# Patient Record
Sex: Female | Born: 1940
Health system: Southern US, Community
[De-identification: ages and names within clinical notes are randomized; demographics above are authoritative.]

## PROBLEM LIST (undated history)

## (undated) DIAGNOSIS — Z87442 Personal history of urinary calculi: Secondary | ICD-10-CM

## (undated) DIAGNOSIS — E7800 Pure hypercholesterolemia, unspecified: Secondary | ICD-10-CM

## (undated) DIAGNOSIS — R112 Nausea with vomiting, unspecified: Secondary | ICD-10-CM

## (undated) DIAGNOSIS — Z87410 Personal history of cervical dysplasia: Secondary | ICD-10-CM

## (undated) DIAGNOSIS — M545 Low back pain, unspecified: Secondary | ICD-10-CM

## (undated) DIAGNOSIS — D649 Anemia, unspecified: Secondary | ICD-10-CM

## (undated) DIAGNOSIS — G4730 Sleep apnea, unspecified: Secondary | ICD-10-CM

## (undated) DIAGNOSIS — K2270 Barrett's esophagus without dysplasia: Secondary | ICD-10-CM

## (undated) DIAGNOSIS — C801 Malignant (primary) neoplasm, unspecified: Secondary | ICD-10-CM

## (undated) DIAGNOSIS — K219 Gastro-esophageal reflux disease without esophagitis: Secondary | ICD-10-CM

## (undated) DIAGNOSIS — K3184 Gastroparesis: Secondary | ICD-10-CM

## (undated) DIAGNOSIS — E119 Type 2 diabetes mellitus without complications: Secondary | ICD-10-CM

## (undated) DIAGNOSIS — M179 Osteoarthritis of knee, unspecified: Secondary | ICD-10-CM

## (undated) DIAGNOSIS — N189 Chronic kidney disease, unspecified: Secondary | ICD-10-CM

## (undated) DIAGNOSIS — M1710 Unilateral primary osteoarthritis, unspecified knee: Secondary | ICD-10-CM

## (undated) DIAGNOSIS — E1143 Type 2 diabetes mellitus with diabetic autonomic (poly)neuropathy: Secondary | ICD-10-CM

## (undated) DIAGNOSIS — Z87440 Personal history of urinary (tract) infections: Secondary | ICD-10-CM

## (undated) DIAGNOSIS — L308 Other specified dermatitis: Secondary | ICD-10-CM

## (undated) DIAGNOSIS — M1990 Unspecified osteoarthritis, unspecified site: Secondary | ICD-10-CM

## (undated) DIAGNOSIS — H409 Unspecified glaucoma: Secondary | ICD-10-CM

## (undated) DIAGNOSIS — E1140 Type 2 diabetes mellitus with diabetic neuropathy, unspecified: Secondary | ICD-10-CM

## (undated) DIAGNOSIS — E2740 Unspecified adrenocortical insufficiency: Secondary | ICD-10-CM

## (undated) DIAGNOSIS — E041 Nontoxic single thyroid nodule: Secondary | ICD-10-CM

## (undated) DIAGNOSIS — I10 Essential (primary) hypertension: Secondary | ICD-10-CM

## (undated) DIAGNOSIS — I739 Peripheral vascular disease, unspecified: Secondary | ICD-10-CM

## (undated) DIAGNOSIS — D869 Sarcoidosis, unspecified: Secondary | ICD-10-CM

## (undated) DIAGNOSIS — R1310 Dysphagia, unspecified: Secondary | ICD-10-CM

## (undated) DIAGNOSIS — Z9889 Other specified postprocedural states: Secondary | ICD-10-CM

## (undated) DIAGNOSIS — M8580 Other specified disorders of bone density and structure, unspecified site: Secondary | ICD-10-CM

## (undated) DIAGNOSIS — R0602 Shortness of breath: Secondary | ICD-10-CM

## (undated) DIAGNOSIS — H9191 Unspecified hearing loss, right ear: Secondary | ICD-10-CM

## (undated) HISTORY — DX: Unilateral primary osteoarthritis, unspecified knee: M17.10

## (undated) HISTORY — DX: Peripheral vascular disease, unspecified: I73.9

## (undated) HISTORY — DX: Type 2 diabetes mellitus with diabetic autonomic (poly)neuropathy: E11.43

## (undated) HISTORY — DX: Gastroparesis: K31.84

## (undated) HISTORY — PX: BREAST BIOPSY: SHX20

## (undated) HISTORY — PX: ESOPHAGOGASTRODUODENOSCOPY (EGD) WITH ESOPHAGEAL DILATION: SHX5812

## (undated) HISTORY — DX: Low back pain: M54.5

## (undated) HISTORY — PX: COLONOSCOPY: SHX174

## (undated) HISTORY — DX: Other specified dermatitis: L30.8

## (undated) HISTORY — PX: OTHER SURGICAL HISTORY: SHX169

## (undated) HISTORY — DX: Barrett's esophagus without dysplasia: K22.70

## (undated) HISTORY — DX: Other specified disorders of bone density and structure, unspecified site: M85.80

## (undated) HISTORY — PX: BREAST EXCISIONAL BIOPSY: SUR124

## (undated) HISTORY — DX: Low back pain, unspecified: M54.50

## (undated) HISTORY — DX: Type 2 diabetes mellitus with diabetic neuropathy, unspecified: E11.40

## (undated) HISTORY — DX: Personal history of cervical dysplasia: Z87.410

## (undated) HISTORY — DX: Osteoarthritis of knee, unspecified: M17.9

## (undated) HISTORY — DX: Nontoxic single thyroid nodule: E04.1

---

## 1963-12-31 HISTORY — PX: DILATION AND CURETTAGE OF UTERUS: SHX78

## 1969-12-30 HISTORY — PX: PARTIAL HYSTERECTOMY: SHX80

## 1979-12-31 HISTORY — PX: CHOLECYSTECTOMY: SHX55

## 1982-12-30 HISTORY — PX: BRONCHOSCOPY: SUR163

## 1998-12-12 ENCOUNTER — Encounter: Payer: Self-pay | Admitting: Internal Medicine

## 1998-12-12 ENCOUNTER — Inpatient Hospital Stay (HOSPITAL_COMMUNITY): Admission: AD | Admit: 1998-12-12 | Discharge: 1998-12-13 | Payer: Self-pay | Admitting: Internal Medicine

## 1999-04-05 ENCOUNTER — Observation Stay (HOSPITAL_COMMUNITY): Admission: AD | Admit: 1999-04-05 | Discharge: 1999-04-06 | Payer: Self-pay | Admitting: Family Medicine

## 2000-03-14 ENCOUNTER — Encounter: Payer: Self-pay | Admitting: Family Medicine

## 2000-03-14 ENCOUNTER — Encounter: Admission: RE | Admit: 2000-03-14 | Discharge: 2000-03-14 | Payer: Self-pay | Admitting: Family Medicine

## 2000-07-30 ENCOUNTER — Encounter: Payer: Self-pay | Admitting: Internal Medicine

## 2000-07-30 ENCOUNTER — Inpatient Hospital Stay (HOSPITAL_COMMUNITY): Admission: EM | Admit: 2000-07-30 | Discharge: 2000-08-01 | Payer: Self-pay | Admitting: *Deleted

## 2000-08-01 ENCOUNTER — Encounter: Payer: Self-pay | Admitting: Internal Medicine

## 2001-09-02 ENCOUNTER — Ambulatory Visit (HOSPITAL_COMMUNITY): Admission: RE | Admit: 2001-09-02 | Discharge: 2001-09-02 | Payer: Self-pay | Admitting: Gastroenterology

## 2002-07-05 ENCOUNTER — Encounter: Admission: RE | Admit: 2002-07-05 | Discharge: 2002-07-05 | Payer: Self-pay | Admitting: Family Medicine

## 2002-07-05 ENCOUNTER — Encounter: Payer: Self-pay | Admitting: Family Medicine

## 2003-04-27 ENCOUNTER — Encounter: Payer: Self-pay | Admitting: Specialist

## 2003-04-27 ENCOUNTER — Encounter: Admission: RE | Admit: 2003-04-27 | Discharge: 2003-04-27 | Payer: Self-pay | Admitting: Specialist

## 2003-09-14 ENCOUNTER — Encounter: Admission: RE | Admit: 2003-09-14 | Discharge: 2003-09-14 | Payer: Self-pay | Admitting: Gastroenterology

## 2003-09-14 ENCOUNTER — Encounter: Payer: Self-pay | Admitting: Gastroenterology

## 2003-09-26 ENCOUNTER — Ambulatory Visit (HOSPITAL_COMMUNITY): Admission: RE | Admit: 2003-09-26 | Discharge: 2003-09-26 | Payer: Self-pay | Admitting: Gastroenterology

## 2003-09-26 ENCOUNTER — Encounter: Payer: Self-pay | Admitting: Gastroenterology

## 2003-10-31 ENCOUNTER — Ambulatory Visit (HOSPITAL_COMMUNITY): Admission: RE | Admit: 2003-10-31 | Discharge: 2003-10-31 | Payer: Self-pay | Admitting: Family Medicine

## 2004-03-20 ENCOUNTER — Other Ambulatory Visit: Admission: RE | Admit: 2004-03-20 | Discharge: 2004-03-20 | Payer: Self-pay | Admitting: Family Medicine

## 2004-03-28 ENCOUNTER — Ambulatory Visit (HOSPITAL_BASED_OUTPATIENT_CLINIC_OR_DEPARTMENT_OTHER): Admission: RE | Admit: 2004-03-28 | Discharge: 2004-03-28 | Payer: Self-pay | Admitting: Family Medicine

## 2004-04-18 ENCOUNTER — Ambulatory Visit (HOSPITAL_BASED_OUTPATIENT_CLINIC_OR_DEPARTMENT_OTHER): Admission: RE | Admit: 2004-04-18 | Discharge: 2004-04-18 | Payer: Self-pay | Admitting: Family Medicine

## 2004-10-14 ENCOUNTER — Encounter: Admission: RE | Admit: 2004-10-14 | Discharge: 2004-10-14 | Payer: Self-pay | Admitting: Specialist

## 2005-04-02 ENCOUNTER — Other Ambulatory Visit: Admission: RE | Admit: 2005-04-02 | Discharge: 2005-04-02 | Payer: Self-pay | Admitting: Family Medicine

## 2005-04-18 ENCOUNTER — Ambulatory Visit (HOSPITAL_COMMUNITY): Admission: RE | Admit: 2005-04-18 | Discharge: 2005-04-18 | Payer: Self-pay | Admitting: Gastroenterology

## 2006-01-03 ENCOUNTER — Other Ambulatory Visit: Admission: RE | Admit: 2006-01-03 | Discharge: 2006-01-03 | Payer: Self-pay | Admitting: Obstetrics and Gynecology

## 2007-05-22 ENCOUNTER — Other Ambulatory Visit: Admission: RE | Admit: 2007-05-22 | Discharge: 2007-05-22 | Payer: Self-pay | Admitting: Family Medicine

## 2008-07-28 ENCOUNTER — Other Ambulatory Visit: Admission: RE | Admit: 2008-07-28 | Discharge: 2008-07-28 | Payer: Self-pay | Admitting: Family Medicine

## 2008-12-30 HISTORY — PX: ORIF DISTAL RADIUS FRACTURE: SUR927

## 2009-05-12 ENCOUNTER — Ambulatory Visit (HOSPITAL_COMMUNITY): Admission: RE | Admit: 2009-05-12 | Discharge: 2009-05-13 | Payer: Self-pay | Admitting: Professional

## 2009-05-12 ENCOUNTER — Encounter: Admission: RE | Admit: 2009-05-12 | Discharge: 2009-05-12 | Payer: Self-pay | Admitting: Orthopedic Surgery

## 2009-08-07 ENCOUNTER — Other Ambulatory Visit: Admission: RE | Admit: 2009-08-07 | Discharge: 2009-08-07 | Payer: Self-pay | Admitting: Family Medicine

## 2009-12-30 HISTORY — PX: KNEE ARTHROSCOPY: SUR90

## 2010-09-27 ENCOUNTER — Ambulatory Visit: Payer: Self-pay | Admitting: Diagnostic Radiology

## 2010-09-27 ENCOUNTER — Emergency Department (HOSPITAL_BASED_OUTPATIENT_CLINIC_OR_DEPARTMENT_OTHER): Admission: EM | Admit: 2010-09-27 | Discharge: 2010-09-27 | Payer: Self-pay | Admitting: Emergency Medicine

## 2011-02-14 ENCOUNTER — Other Ambulatory Visit: Payer: Self-pay | Admitting: Family Medicine

## 2011-02-14 ENCOUNTER — Other Ambulatory Visit (HOSPITAL_COMMUNITY)
Admission: RE | Admit: 2011-02-14 | Discharge: 2011-02-14 | Disposition: A | Discharge status: Home / Self Care | Payer: Medicare Other | Source: Ambulatory Visit | Attending: Family Medicine | Admitting: Family Medicine

## 2011-02-14 DIAGNOSIS — Z124 Encounter for screening for malignant neoplasm of cervix: Secondary | ICD-10-CM | POA: Insufficient documentation

## 2011-04-09 LAB — CBC
HCT: 38.1 % (ref 36.0–46.0)
Hemoglobin: 12.9 g/dL (ref 12.0–15.0)
MCV: 88.4 fL (ref 78.0–100.0)
Platelets: 166 10*3/uL (ref 150–400)
RBC: 4.3 MIL/uL (ref 3.87–5.11)
WBC: 8.6 10*3/uL (ref 4.0–10.5)

## 2011-04-09 LAB — GLUCOSE, CAPILLARY: Glucose-Capillary: 116 mg/dL — ABNORMAL HIGH (ref 70–99)

## 2011-04-09 LAB — COMPREHENSIVE METABOLIC PANEL
Albumin: 4 g/dL (ref 3.5–5.2)
Alkaline Phosphatase: 47 U/L (ref 39–117)
BUN: 34 mg/dL — ABNORMAL HIGH (ref 6–23)
CO2: 26 mEq/L (ref 19–32)
Chloride: 102 mEq/L (ref 96–112)
Creatinine, Ser: 1.3 mg/dL — ABNORMAL HIGH (ref 0.4–1.2)
GFR calc non Af Amer: 41 mL/min — ABNORMAL LOW (ref >60)
Glucose, Bld: 115 mg/dL — ABNORMAL HIGH (ref 70–99)
Potassium: 3.7 mEq/L (ref 3.5–5.1)
Total Bilirubin: 0.7 mg/dL (ref 0.3–1.2)

## 2011-05-14 NOTE — Op Note (Signed)
NAMEZEKIAH, COEN            ACCOUNT NO.:  [id number]   MEDICAL RECORD NO.:  [id number]          PATIENT TYPE:  AMB   LOCATION:  SDS                          FACILITY:  [facility]   PHYSICIAN:  Harvie Junior, M.D.   DATE OF BIRTH:  10-11-1941   DATE OF PROCEDURE:  05/12/2009  DATE OF DISCHARGE:                               OPERATIVE REPORT   PREOPERATIVE DIAGNOSIS:  Distal radial fracture with 3 intra-articular  fragments.   POSTOPERATIVE DIAGNOSIS:  Distal radial fracture with 3 intra-articular  fragments.   PROCEDURE:  Open reduction and internal fixation of distal radius  fracture greater than 3 articular pieces.   SURGEON:  Harvie Junior, MD.   ASSISTANT:  Marshia Ly, PA   ANESTHESIA:  General.   This is with a DVR plate.   BRIEF HISTORY:  She is a female who fell, suffered a severely comminuted  distal radius fracture.  We evaluated in the Urgent Care on Thursday  night and did a manipulative closed reduction to protect the skin.  I  ultimately discussed treatment options at that point, but ultimately  felt that open reduction and internal fixation is the most appropriate  course of action.  She called on Friday morning and wished to proceed  with this and we went ahead and set her up for Friday afternoon.  She is  brought to the operating room for this procedure.   PROCEDURE IN DETAIL:  The patient was brought to the operating room.  After adequate anesthesia was obtained with general anesthetic, the  patient was placed supine on the operating room table.  The left arm was  then prepped and draped in the usual sterile fashion.  Following this,  the arm was exsanguinated with blood pressure tourniquet was inflated to  250 mmHg.  Following this, an incision was made over the flexor carpi  radialis and the tendon sheath was identified.  The __________and the  muscles were retracted to give access to the pronator quadratus below.  Pronator quadratus was resected  on its radial border and the fracture  line was identified.  At this point, the fracture underwent manipulative  closed reduction to an anatomic position and a DVR plate was chosen.  The initial K-wires were used to get central location and once this was  done, the single central screw was placed.  I then went up distally and  placed the proximal row after placing a K-wire for guidance.  Once that  was done, the pegs were then placed.  All smooth pegs distally and the  ray of the DVR plate.  The proximal screws were then placed and fluoro  images were taken throughout to make sure there was appropriate length  and alignment of fracture.  Once this was completed, the wound was  copiously and thoroughly irrigated.  Pronator quadratus was repaired  with 4-0  Vicryl interrupted.  The skin with 3-0 Vicryl and 4-0 nylon stitching.  A sterile compressive dressing was applied as well as a volar splint.  The patient was taken to the recovery and noted to be in satisfactory  condition.  Estimated blood loss for this procedure was none.      Harvie Junior, M.D.  Electronically Signed     JLG/MEDQ  D:  05/12/2009  T:  05/13/2009  Job:  [id number]

## 2011-05-17 NOTE — Procedures (Signed)
[facility]  Patient:    Sonya Goodwin, Sonya Goodwin Visit Number: [id number] MRN: [id number]          Service Type: Attending:  Verlin Grills, M.D. Dictated by:   Verlin Grills, M.D. Proc. Date: 09/02/01   CC:         Al Decant. Janey Greaser, M.D.   Procedure Report  REFERRING PHYSICIAN:  Al Decant. Janey Greaser, M.D., [facility]  PROCEDURE:  Screening colonoscopy.  PROCEDURE INDICATION:  Sonya Goodwin (date of birth [birth year]) is a 70 year old female who is due for her first surveillance colonoscopy and polypectomy to prevent colon cancer.  ENDOSCOPIST:  Verlin Grills, M.D.  PREMEDICATION:  Versed 10 mg.  ENDOSCOPE:  Olympus pediatric colonoscope.  DESCRIPTION OF PROCEDURE:  After obtaining informed consent, [name] was placed in the left lateral decubitus position.  I administered intravenous Versed to achieve conscious sedation for the procedure.  The patients blood pressure and oxygen saturation and cardiac rhythm were monitored throughout the procedure and documented in the medical record.  Anal inspection was normal.  Digital rectal exam was normal.  The Olympus pediatric video colonoscope was introduced into the rectum.  The patient was turned from the left lateral decubitus position to the supine position.  The endoscope was easily advanced to the cecum.  Colonic preparation for the exam today was excellent to the hepatic flexure.  There was residual fecal matter in the ascending colon and cecum.  Rectum normal.  Sigmoid colon and descending colon normal.  Splenic flexure normal.  Transverse colon normal.  Hepatic flexure normal.  Ascending colon normal.  Cecum and ileocecal valve normal.  ASSESSMENT:  Normal screening proctocolonoscopy to the cecum.  No endoscopic evidence for the presence of colorectal neoplasia. Dictated by:   Verlin Grills, M.D. Attending:   Verlin Grills, M.D. DD:  09/02/01 TD:  09/02/01 Job: [id number]

## 2011-05-17 NOTE — Op Note (Signed)
Sonya Goodwin, Sonya Goodwin            ACCOUNT NO.:  [id number]   MEDICAL RECORD NO.:  [id number]          PATIENT TYPE:  AMB   LOCATION:  ENDO                         FACILITY:  [facility]   PHYSICIAN:  Danise Edge, M.D.   DATE OF BIRTH:  02/23/1941   DATE OF PROCEDURE:  04/18/2005  DATE OF DISCHARGE:                                 OPERATIVE REPORT   PROCEDURE:  Diagnostic colonoscopy.   PROCEDURE INDICATIONS:  Ms. Mathea Frieling is a 70 year old female born  27-Nov-1941.  Ms. Normoyle has unexplained left-sided pelvic pain.  She  has undergone a total abdominal hysterectomy with right oophorectomy.  She  does not tolerate Demerol or fentanyl.   ENDOSCOPIST:  Danise Edge, M.D.   PREMEDICATION:  Versed 10 mg.   PROCEDURE:  After obtaining informed consent, Ms. Mcglaughlin was placed in the  left lateral decubitus position.  I administered intravenous Versed to  achieve conscious sedation for the procedure.  The patient's blood pressure,  oxygen saturation, and cardiac rhythm were monitored throughout the  procedure and documented in the medical record.   Anal inspection and digital rectal exam were normal.  The Olympus adjustable  pediatric colonoscope was introduced into the rectum and with some degree of  difficulty due to left colonic loop formation eventually advanced to the  cecum.  A normal-appearing appendiceal orifice and ileocecal valve were  identified.  Colonic preparation for the exam today was satisfactory.   Rectum normal.   Sigmoid colon and descending colon normal.   Splenic flexure normal.   Transverse colon normal.   Hepatic flexure normal.   Ascending colon normal.   Cecum and ileocecal valve normal.   ASSESSMENT:  Normal diagnostic proctocolonoscopy to the cecum.      MJ/MEDQ  D:  04/18/2005  T:  04/18/2005  Job:  [id number]   cc:   Chales Salmon. Abigail Miyamoto, M.D.  [address]  [place]  [place] [address]  Fax: [phone]

## 2011-05-17 NOTE — H&P (Signed)
[facility]win. [facility]  Patient:    Sonya Goodwin, Sonya Goodwin                   MRN: [id number] Adm. Date:  [id number] Attending:  Rosanne Goodwin CC:         Sonya Goodwin. Sonya Goodwin, M.D., [facility]  Sonya Goodwin, M.D. at [facility]   History and Physical  DATE OF BIRTH:  1941/09/11  PROBLEM LIST:  1. Vertigo of unknown etiology.     Rule out adrenal crisis, posterior circulation strokes or vascular     stenosis, brain stem sarcoidosis versus other etiologies.  2. Intractable nausea secondary to problem #1.  3. Chronic steroid therapy secondary to sarcoid.     History of adrenal crisis in December 1999.  4. Sarcoidosis since 1994.     a. On methotrexate and Medrol.     b. Skin and pulmonary involvement.     c. 2-D echocardiogram consistent with left ventricular hypertrophy,        normal ejection fraction, normal right ventricle, mild mitral        regurgitation, mild tricuspid regurgitation.     d. ACE level 65 in May 2001.     e. Decreased hearing on the right, thought to be secondary to        sarcoidosis.  5. Hypertension.  6. Osteopenia and osteoporosis secondary to chronic steroid use.  7. Hyperlipidemia.  8. Glaucoma.  9. History of vaginal dysplasia. 10. Right cataract secondary to steroid therapy. 11. History of fibrocystic breast disease in 1975. 12. History of diverticulosis by flexible sigmoidoscopy in March 1999. 13. Allergic rhinitis and post nasal drip in December 1999. 14. Status post hysterectomy in 1971, status post cholecystectomy in 1981.  ALLERGIES:  PENICILLIN (mouth blistering and swelling), STATINS (mild ascites).  CHIEF COMPLAINT:  Vertigo and uncontrolled nausea.  HISTORY OF PRESENT ILLNESS:  Ms. Sonya Goodwin is a very pleasant 70 year old female who presents with 24-hour history of vertigo of progressive onset.  The patient describes mild vertigo since yesterday about 3 p.m.  No focal  weakness associated to these symptoms.  No headache.  No syncope, no double vision, no tinnitus.  No decreased hearing from baseline.  This vertigo is described as intense and constant.  This dizziness can get worse with any body movement or head movement.  No fever or chills.  No vomiting.  The patient had one episode of diarrhea yesterday, though she has had intermittent diarrhea for many years.  No malaise.  No neck stiffness.  Visual fixation does not help this vertigo.  PAST MEDICAL HISTORY:  As problem list.  ALLERGIES:  As above.  MEDICATIONS: 1. Sonya Goodwin 4 mg p.o. q.d. 2. Methotrexate 10 mg once a week. 3. Medrol 10 mg once a day. 4. Elavil 25 mg q.h.s. 5. Maxzide 12.5 mg q.a.m. 6. Multivitamins once a day.  The patient was supposed to take Sonya Goodwin, Sonya Goodwin, and Sonya Goodwin, though these medications were discontinued many months ago.  SOCIAL HISTORY:  The patient is married.  She is a Futures trader.  She has two grown children.  No alcohol use.  No tobacco use.  No new medications.  No over-the-counter medications.  FAMILY MEDICAL HISTORY:  The patients father died of stomach and liver cancer.  Her mother had osteoarthritis.  Her grandmother had leukemia.  No strokes, diabetes, hypertension in the family.  No early coronary artery disease in the family.  REVIEW OF SYSTEMS:  As HPI.  No urinary symptoms.  No abdominal symptoms.  No rhinorrhea, no post nasal drip, no focal weakness, no swallowing problems.  No amorous fugax.  No lower extremity swelling.  No synovitis.  No melena, no tarry stools, no bright red blood per rectum.  PHYSICAL EXAMINATION:  VITAL SIGNS:  Temperature 97.6, blood pressure 159/76, no orthostatic blood pressure changes seen post standing.  Heart rate 73, respirations 26, oxygen saturation 99% on room air.  HEENT:  Normocephalic, nontraumatic.  Nonicteric sclerae, conjunctivae within normal limits.  Funduscopic exam negative for hemorrhages or  papilledema. PERRLA, EOMI.  TMs within normal limits.  Slight dry mucous membranes. Oropharynx clear.  NECK:  Supple, no JVD, no bruits, no lymphadenopathy, no thyromegaly.  LUNGS:  Clear to auscultation bilaterally without crackles, wheezes.  Good air movement bilaterally.  CARDIAC:  Regular rate and rhythm with 1/6 systolic ejection murmur at the left lower sternal border, not radiated to apex.  No S3, no rubs.  ABDOMEN:  Nontender, nondistended, bowel sounds were present.  No hepatosplenomegaly.  No bruits, no masses, no rebound.  BREAST:  Within normal limits.  RECTAL:  Not done.  GENITOURINARY:  Within normal limits.  EXTREMITIES:  No edema, clubbing, or cyanosis.  Pulses 1+ bilaterally.  NEUROLOGIC:  Alert, oriented x 3.  Motor strength 5/5 in all extremities. DTRs 3/5 in all extremities.  Cranial nerves reveal decreased hearing in the right side, though this has been unchanged for many years.  Otherwise, within normal limits.  Sensorium is intact.  Plantar reflexes are downgoing bilaterally.  No nystagmus.  Gait within normal limits.  No obvious cerebellar findings.  LABORATORY DATA:  Pending.  ASSESSMENT AND PLAN: 1. Vertigo of unknown etiology - Given the history of presentation, physical    exam which includes the lack of evidence of nystagmus, no neurologic    findings, the etiology of this onset of vertigo remains not clear.    Other causes of vertigo, like benign positional vertigo, Menieres,    labyrinthitis are less likely.  Other etiologies to be considered are    adrenal insufficiency, though no orthostatic blood pressure changes are    found, posterior circulation strokes or ischemia.  No evidence of    meningitis are noticed today.  No previous history or recent history of    flu-like symptoms.  No skin rash.  Also, some cases of temporal lobe    epilepsy can appear sometimes only with vertigo, thought this is a more    remote possibility.  Will go ahead  and admit the patient to a regular     bed for further evaluation.  The reason for this admission also is based    on the fact that the patient is unable to tolerate tablets due to her    intractable nausea.  [name] has taken steroid therapy for many    years for her sarcoid, and she would be at risk for adrenal crisis.    Will go ahead and obtain an MRI/MRA of the brain and brain stem for    further evaluation.  The electrolytes will be evaluated too, to evaluate    causes like adrenal crisis.  For now, will use aspirin for brain    protection.  Also, empirically, will use Antivert and Phenergan for    symptom therapy.  Intravenous cortisone, stress dose, will be used    until the patient is able to tolerate p.o. intake. 2. Intractable nausea - As problem #1. 3. Current steroid therapy  secondary to sarcoid - As problem #1. 4. Hypertension - The patients blood pressure is currently stable.  There is    no evidence of orthostatic blood pressure changes.  Will continue the    home therapy at this point.  Follow-up the blood pressure response. DD:  07/30/00 TD:  07/31/00 Job: [id number]

## 2012-02-19 ENCOUNTER — Other Ambulatory Visit: Payer: Self-pay | Admitting: Family Medicine

## 2012-02-19 ENCOUNTER — Other Ambulatory Visit (HOSPITAL_COMMUNITY)
Admission: RE | Admit: 2012-02-19 | Discharge: 2012-02-19 | Disposition: A | Discharge status: Home / Self Care | Payer: Medicare Other | Source: Ambulatory Visit | Attending: Family Medicine | Admitting: Family Medicine

## 2012-02-19 DIAGNOSIS — R82998 Other abnormal findings in urine: Secondary | ICD-10-CM | POA: Diagnosis not present

## 2012-02-19 DIAGNOSIS — Z0000 Encounter for general adult medical examination without abnormal findings: Secondary | ICD-10-CM | POA: Diagnosis not present

## 2012-02-19 DIAGNOSIS — E782 Mixed hyperlipidemia: Secondary | ICD-10-CM | POA: Diagnosis not present

## 2012-02-19 DIAGNOSIS — Z124 Encounter for screening for malignant neoplasm of cervix: Secondary | ICD-10-CM | POA: Diagnosis not present

## 2012-02-19 DIAGNOSIS — M949 Disorder of cartilage, unspecified: Secondary | ICD-10-CM | POA: Diagnosis not present

## 2012-02-19 DIAGNOSIS — M899 Disorder of bone, unspecified: Secondary | ICD-10-CM | POA: Diagnosis not present

## 2012-02-19 DIAGNOSIS — E2749 Other adrenocortical insufficiency: Secondary | ICD-10-CM | POA: Diagnosis not present

## 2012-02-19 DIAGNOSIS — I10 Essential (primary) hypertension: Secondary | ICD-10-CM | POA: Diagnosis not present

## 2012-02-19 DIAGNOSIS — E119 Type 2 diabetes mellitus without complications: Secondary | ICD-10-CM | POA: Diagnosis not present

## 2012-03-03 DIAGNOSIS — H409 Unspecified glaucoma: Secondary | ICD-10-CM | POA: Diagnosis not present

## 2012-03-03 DIAGNOSIS — H4010X0 Unspecified open-angle glaucoma, stage unspecified: Secondary | ICD-10-CM | POA: Diagnosis not present

## 2012-03-03 DIAGNOSIS — Z961 Presence of intraocular lens: Secondary | ICD-10-CM | POA: Diagnosis not present

## 2012-03-23 DIAGNOSIS — D136 Benign neoplasm of pancreas: Secondary | ICD-10-CM | POA: Diagnosis not present

## 2012-04-07 DIAGNOSIS — D869 Sarcoidosis, unspecified: Secondary | ICD-10-CM | POA: Diagnosis not present

## 2012-04-07 DIAGNOSIS — J99 Respiratory disorders in diseases classified elsewhere: Secondary | ICD-10-CM | POA: Diagnosis not present

## 2012-04-14 DIAGNOSIS — Z1231 Encounter for screening mammogram for malignant neoplasm of breast: Secondary | ICD-10-CM | POA: Diagnosis not present

## 2012-04-14 DIAGNOSIS — Z9189 Other specified personal risk factors, not elsewhere classified: Secondary | ICD-10-CM | POA: Diagnosis not present

## 2012-04-14 DIAGNOSIS — Z1239 Encounter for other screening for malignant neoplasm of breast: Secondary | ICD-10-CM | POA: Diagnosis not present

## 2012-04-14 DIAGNOSIS — L299 Pruritus, unspecified: Secondary | ICD-10-CM | POA: Diagnosis not present

## 2012-05-22 DIAGNOSIS — N183 Chronic kidney disease, stage 3 unspecified: Secondary | ICD-10-CM | POA: Diagnosis not present

## 2012-05-22 DIAGNOSIS — E119 Type 2 diabetes mellitus without complications: Secondary | ICD-10-CM | POA: Diagnosis not present

## 2012-05-22 DIAGNOSIS — Z79899 Other long term (current) drug therapy: Secondary | ICD-10-CM | POA: Diagnosis not present

## 2012-05-22 DIAGNOSIS — IMO0001 Reserved for inherently not codable concepts without codable children: Secondary | ICD-10-CM | POA: Diagnosis not present

## 2012-05-26 DIAGNOSIS — R1312 Dysphagia, oropharyngeal phase: Secondary | ICD-10-CM | POA: Diagnosis not present

## 2012-05-26 DIAGNOSIS — K228 Other specified diseases of esophagus: Secondary | ICD-10-CM | POA: Diagnosis not present

## 2012-05-26 DIAGNOSIS — R1311 Dysphagia, oral phase: Secondary | ICD-10-CM | POA: Diagnosis not present

## 2012-05-26 DIAGNOSIS — K229 Disease of esophagus, unspecified: Secondary | ICD-10-CM | POA: Diagnosis not present

## 2012-05-26 DIAGNOSIS — K2990 Gastroduodenitis, unspecified, without bleeding: Secondary | ICD-10-CM | POA: Diagnosis not present

## 2012-05-26 DIAGNOSIS — K2289 Other specified disease of esophagus: Secondary | ICD-10-CM | POA: Diagnosis not present

## 2012-05-26 DIAGNOSIS — K2970 Gastritis, unspecified, without bleeding: Secondary | ICD-10-CM | POA: Diagnosis not present

## 2012-06-10 DIAGNOSIS — E119 Type 2 diabetes mellitus without complications: Secondary | ICD-10-CM | POA: Diagnosis not present

## 2012-06-10 DIAGNOSIS — E2749 Other adrenocortical insufficiency: Secondary | ICD-10-CM | POA: Diagnosis not present

## 2012-06-10 DIAGNOSIS — M949 Disorder of cartilage, unspecified: Secondary | ICD-10-CM | POA: Diagnosis not present

## 2012-06-10 DIAGNOSIS — M899 Disorder of bone, unspecified: Secondary | ICD-10-CM | POA: Diagnosis not present

## 2012-06-10 DIAGNOSIS — N183 Chronic kidney disease, stage 3 unspecified: Secondary | ICD-10-CM | POA: Diagnosis not present

## 2012-06-10 DIAGNOSIS — Z79899 Other long term (current) drug therapy: Secondary | ICD-10-CM | POA: Diagnosis not present

## 2012-06-10 DIAGNOSIS — D869 Sarcoidosis, unspecified: Secondary | ICD-10-CM | POA: Diagnosis not present

## 2012-06-10 DIAGNOSIS — I10 Essential (primary) hypertension: Secondary | ICD-10-CM | POA: Diagnosis not present

## 2012-06-10 DIAGNOSIS — E782 Mixed hyperlipidemia: Secondary | ICD-10-CM | POA: Diagnosis not present

## 2012-09-09 DIAGNOSIS — E782 Mixed hyperlipidemia: Secondary | ICD-10-CM | POA: Diagnosis not present

## 2012-09-17 DIAGNOSIS — I10 Essential (primary) hypertension: Secondary | ICD-10-CM | POA: Diagnosis not present

## 2012-09-17 DIAGNOSIS — E782 Mixed hyperlipidemia: Secondary | ICD-10-CM | POA: Diagnosis not present

## 2012-09-21 DIAGNOSIS — E1129 Type 2 diabetes mellitus with other diabetic kidney complication: Secondary | ICD-10-CM | POA: Diagnosis not present

## 2012-09-21 DIAGNOSIS — E2749 Other adrenocortical insufficiency: Secondary | ICD-10-CM | POA: Diagnosis not present

## 2012-09-21 DIAGNOSIS — Z1331 Encounter for screening for depression: Secondary | ICD-10-CM | POA: Diagnosis not present

## 2012-09-21 DIAGNOSIS — N183 Chronic kidney disease, stage 3 unspecified: Secondary | ICD-10-CM | POA: Diagnosis not present

## 2012-09-21 DIAGNOSIS — Z23 Encounter for immunization: Secondary | ICD-10-CM | POA: Diagnosis not present

## 2012-10-02 DIAGNOSIS — H4010X0 Unspecified open-angle glaucoma, stage unspecified: Secondary | ICD-10-CM | POA: Diagnosis not present

## 2012-10-02 DIAGNOSIS — H2510 Age-related nuclear cataract, unspecified eye: Secondary | ICD-10-CM | POA: Diagnosis not present

## 2012-10-06 DIAGNOSIS — D869 Sarcoidosis, unspecified: Secondary | ICD-10-CM | POA: Diagnosis not present

## 2012-10-06 DIAGNOSIS — J99 Respiratory disorders in diseases classified elsewhere: Secondary | ICD-10-CM | POA: Diagnosis not present

## 2013-01-08 DIAGNOSIS — E782 Mixed hyperlipidemia: Secondary | ICD-10-CM | POA: Diagnosis not present

## 2013-01-08 DIAGNOSIS — N183 Chronic kidney disease, stage 3 unspecified: Secondary | ICD-10-CM | POA: Diagnosis not present

## 2013-01-08 DIAGNOSIS — I10 Essential (primary) hypertension: Secondary | ICD-10-CM | POA: Diagnosis not present

## 2013-01-08 DIAGNOSIS — M899 Disorder of bone, unspecified: Secondary | ICD-10-CM | POA: Diagnosis not present

## 2013-01-08 DIAGNOSIS — E2749 Other adrenocortical insufficiency: Secondary | ICD-10-CM | POA: Diagnosis not present

## 2013-01-08 DIAGNOSIS — E1129 Type 2 diabetes mellitus with other diabetic kidney complication: Secondary | ICD-10-CM | POA: Diagnosis not present

## 2013-02-01 DIAGNOSIS — N23 Unspecified renal colic: Secondary | ICD-10-CM | POA: Diagnosis not present

## 2013-03-22 DIAGNOSIS — D279 Benign neoplasm of unspecified ovary: Secondary | ICD-10-CM | POA: Diagnosis not present

## 2013-03-22 DIAGNOSIS — D136 Benign neoplasm of pancreas: Secondary | ICD-10-CM | POA: Diagnosis not present

## 2013-03-22 DIAGNOSIS — K869 Disease of pancreas, unspecified: Secondary | ICD-10-CM | POA: Diagnosis not present

## 2013-03-24 DIAGNOSIS — E2749 Other adrenocortical insufficiency: Secondary | ICD-10-CM | POA: Diagnosis not present

## 2013-03-24 DIAGNOSIS — E1129 Type 2 diabetes mellitus with other diabetic kidney complication: Secondary | ICD-10-CM | POA: Diagnosis not present

## 2013-03-24 DIAGNOSIS — E7800 Pure hypercholesterolemia, unspecified: Secondary | ICD-10-CM | POA: Diagnosis not present

## 2013-03-24 DIAGNOSIS — N183 Chronic kidney disease, stage 3 unspecified: Secondary | ICD-10-CM | POA: Diagnosis not present

## 2013-03-24 DIAGNOSIS — Z79899 Other long term (current) drug therapy: Secondary | ICD-10-CM | POA: Diagnosis not present

## 2013-03-29 DIAGNOSIS — N390 Urinary tract infection, site not specified: Secondary | ICD-10-CM | POA: Diagnosis not present

## 2013-03-29 DIAGNOSIS — R300 Dysuria: Secondary | ICD-10-CM | POA: Diagnosis not present

## 2013-04-08 DIAGNOSIS — M949 Disorder of cartilage, unspecified: Secondary | ICD-10-CM | POA: Diagnosis not present

## 2013-04-08 DIAGNOSIS — M899 Disorder of bone, unspecified: Secondary | ICD-10-CM | POA: Diagnosis not present

## 2013-04-08 DIAGNOSIS — R350 Frequency of micturition: Secondary | ICD-10-CM | POA: Diagnosis not present

## 2013-04-08 DIAGNOSIS — E1129 Type 2 diabetes mellitus with other diabetic kidney complication: Secondary | ICD-10-CM | POA: Diagnosis not present

## 2013-04-08 DIAGNOSIS — E2749 Other adrenocortical insufficiency: Secondary | ICD-10-CM | POA: Diagnosis not present

## 2013-04-08 DIAGNOSIS — E785 Hyperlipidemia, unspecified: Secondary | ICD-10-CM | POA: Diagnosis not present

## 2013-04-13 DIAGNOSIS — H18419 Arcus senilis, unspecified eye: Secondary | ICD-10-CM | POA: Diagnosis not present

## 2013-04-13 DIAGNOSIS — H4010X0 Unspecified open-angle glaucoma, stage unspecified: Secondary | ICD-10-CM | POA: Diagnosis not present

## 2013-04-13 DIAGNOSIS — Z961 Presence of intraocular lens: Secondary | ICD-10-CM | POA: Diagnosis not present

## 2013-04-13 DIAGNOSIS — H409 Unspecified glaucoma: Secondary | ICD-10-CM | POA: Diagnosis not present

## 2013-05-04 DIAGNOSIS — D869 Sarcoidosis, unspecified: Secondary | ICD-10-CM | POA: Diagnosis not present

## 2013-05-04 DIAGNOSIS — J99 Respiratory disorders in diseases classified elsewhere: Secondary | ICD-10-CM | POA: Diagnosis not present

## 2013-06-02 ENCOUNTER — Other Ambulatory Visit: Payer: Self-pay | Admitting: Dermatology

## 2013-06-02 DIAGNOSIS — R238 Other skin changes: Secondary | ICD-10-CM | POA: Diagnosis not present

## 2013-06-02 DIAGNOSIS — I781 Nevus, non-neoplastic: Secondary | ICD-10-CM | POA: Diagnosis not present

## 2013-06-02 DIAGNOSIS — D235 Other benign neoplasm of skin of trunk: Secondary | ICD-10-CM | POA: Diagnosis not present

## 2013-06-02 DIAGNOSIS — Z85828 Personal history of other malignant neoplasm of skin: Secondary | ICD-10-CM | POA: Diagnosis not present

## 2013-06-02 DIAGNOSIS — D485 Neoplasm of uncertain behavior of skin: Secondary | ICD-10-CM | POA: Diagnosis not present

## 2013-06-02 DIAGNOSIS — L570 Actinic keratosis: Secondary | ICD-10-CM | POA: Diagnosis not present

## 2013-06-21 DIAGNOSIS — R300 Dysuria: Secondary | ICD-10-CM | POA: Diagnosis not present

## 2013-06-21 DIAGNOSIS — N390 Urinary tract infection, site not specified: Secondary | ICD-10-CM | POA: Diagnosis not present

## 2013-08-20 DIAGNOSIS — E1129 Type 2 diabetes mellitus with other diabetic kidney complication: Secondary | ICD-10-CM | POA: Diagnosis not present

## 2013-08-20 DIAGNOSIS — E785 Hyperlipidemia, unspecified: Secondary | ICD-10-CM | POA: Diagnosis not present

## 2013-08-20 DIAGNOSIS — I10 Essential (primary) hypertension: Secondary | ICD-10-CM | POA: Diagnosis not present

## 2013-08-20 DIAGNOSIS — Z23 Encounter for immunization: Secondary | ICD-10-CM | POA: Diagnosis not present

## 2013-08-20 DIAGNOSIS — M25559 Pain in unspecified hip: Secondary | ICD-10-CM | POA: Diagnosis not present

## 2013-09-14 DIAGNOSIS — S335XXA Sprain of ligaments of lumbar spine, initial encounter: Secondary | ICD-10-CM | POA: Diagnosis not present

## 2013-09-21 DIAGNOSIS — M545 Low back pain, unspecified: Secondary | ICD-10-CM | POA: Diagnosis not present

## 2013-09-24 DIAGNOSIS — H25019 Cortical age-related cataract, unspecified eye: Secondary | ICD-10-CM | POA: Diagnosis not present

## 2013-09-24 DIAGNOSIS — H2510 Age-related nuclear cataract, unspecified eye: Secondary | ICD-10-CM | POA: Diagnosis not present

## 2013-09-24 DIAGNOSIS — H18419 Arcus senilis, unspecified eye: Secondary | ICD-10-CM | POA: Diagnosis not present

## 2013-09-24 DIAGNOSIS — H02839 Dermatochalasis of unspecified eye, unspecified eyelid: Secondary | ICD-10-CM | POA: Diagnosis not present

## 2013-09-25 ENCOUNTER — Other Ambulatory Visit: Payer: Self-pay | Admitting: Interventional Cardiology

## 2013-09-25 DIAGNOSIS — E782 Mixed hyperlipidemia: Secondary | ICD-10-CM

## 2013-09-25 DIAGNOSIS — Z79899 Other long term (current) drug therapy: Secondary | ICD-10-CM

## 2013-10-05 DIAGNOSIS — M545 Low back pain, unspecified: Secondary | ICD-10-CM | POA: Diagnosis not present

## 2013-10-07 ENCOUNTER — Other Ambulatory Visit (INDEPENDENT_AMBULATORY_CARE_PROVIDER_SITE_OTHER): Payer: Medicare Other

## 2013-10-07 DIAGNOSIS — E782 Mixed hyperlipidemia: Secondary | ICD-10-CM

## 2013-10-07 DIAGNOSIS — Z79899 Other long term (current) drug therapy: Secondary | ICD-10-CM | POA: Diagnosis not present

## 2013-10-07 DIAGNOSIS — IMO0001 Reserved for inherently not codable concepts without codable children: Secondary | ICD-10-CM | POA: Diagnosis not present

## 2013-10-07 LAB — LIPID PANEL
Cholesterol: 294 mg/dL — ABNORMAL HIGH (ref 0–200)
Total CHOL/HDL Ratio: 5
VLDL: 80 mg/dL — ABNORMAL HIGH (ref 0.0–40.0)

## 2013-10-07 LAB — ALT: ALT: 28 U/L (ref 0–35)

## 2013-10-13 ENCOUNTER — Telehealth: Payer: Self-pay | Admitting: General Surgery

## 2013-10-13 DIAGNOSIS — Z79899 Other long term (current) drug therapy: Secondary | ICD-10-CM

## 2013-10-13 DIAGNOSIS — E785 Hyperlipidemia, unspecified: Secondary | ICD-10-CM

## 2013-10-13 NOTE — Telephone Encounter (Signed)
Message copied by Nita Sells on Wed Oct 13, 2013  1:36 PM ------      Message from: SMART, Gaspar Skeeters      Created: Fri Oct 08, 2013  9:50 AM       RF: Diabetes, HTN, age, low HDL- LDL goal< 100 at least, non-HDL goal < 130 at least.      Patient suppose to be taking Crestor 10 mg once weekly, Nordic Naturals fish oil 6 g/d.         Intolerance Lipitor, crestor (daily), Zocor, Zetia, Fibrates (mylgias). Lovastatin (stopped b/c impaired renal fxn. Niacin and Niaspan.       Patient had GI problems and trouble affording Welchol in the past.      H/o TG > 500, however these improved on fish oil 6 g/d - TG back up to 400 today though.  Last A1C was good at 6.2. Scr 1.4 in past (stable).      In 02/2013 TC was 196, TG 160, LDL 108 - cholesterol worsened significantly over past 6 months.      Plan:      1.  Please find out if patient is taking Crestor once weekly and fish oil 6 per day.  Cholesterol has worsened over past 6 months.      2.  If not taking Crestor or fish oil, please let me know why.  Thank you.       ------

## 2013-10-13 NOTE — Telephone Encounter (Signed)
Given her LDL > 150 and TG > 400 in patient with Diabetes, I would like for her add lovastatin 10 mg qd to regimen again.  She tolerated lovastatin in the past.  Recheck lipid panel and hepatic panel in 8 weeks.  If patient would prefer to see me in lipid clinic to discuss options, let me know the date and time that would work for her -  and let me know. I will put her on my lipid clinic schedule.  Thanks.

## 2013-10-13 NOTE — Telephone Encounter (Signed)
Pt is taking the fish oil as prescribed but she d/c the crestor herself due to pain in her legs and muscles.

## 2013-10-14 DIAGNOSIS — IMO0001 Reserved for inherently not codable concepts without codable children: Secondary | ICD-10-CM | POA: Diagnosis not present

## 2013-10-15 DIAGNOSIS — M48061 Spinal stenosis, lumbar region without neurogenic claudication: Secondary | ICD-10-CM | POA: Diagnosis not present

## 2013-10-19 DIAGNOSIS — E119 Type 2 diabetes mellitus without complications: Secondary | ICD-10-CM | POA: Diagnosis not present

## 2013-10-19 DIAGNOSIS — H18419 Arcus senilis, unspecified eye: Secondary | ICD-10-CM | POA: Diagnosis not present

## 2013-10-19 DIAGNOSIS — H04129 Dry eye syndrome of unspecified lacrimal gland: Secondary | ICD-10-CM | POA: Diagnosis not present

## 2013-10-19 DIAGNOSIS — Z961 Presence of intraocular lens: Secondary | ICD-10-CM | POA: Diagnosis not present

## 2013-10-19 DIAGNOSIS — H4010X0 Unspecified open-angle glaucoma, stage unspecified: Secondary | ICD-10-CM | POA: Diagnosis not present

## 2013-10-19 DIAGNOSIS — H409 Unspecified glaucoma: Secondary | ICD-10-CM | POA: Diagnosis not present

## 2013-10-20 NOTE — Telephone Encounter (Signed)
Pt refuses to use any statins. To Riki Rusk to advise.

## 2013-10-21 MED ORDER — OMEGA-3-ACID ETHYL ESTERS 1 G PO CAPS
2.0000 g | ORAL_CAPSULE | Freq: Two times a day (BID) | ORAL | Status: DC
Start: 2013-10-21 — End: 2013-11-12

## 2013-10-21 NOTE — Addendum Note (Signed)
Addended by: Lou Miner on: 10/21/2013 02:35 PM   Modules accepted: Orders

## 2013-10-21 NOTE — Telephone Encounter (Signed)
Agree with recommendations.  

## 2013-10-21 NOTE — Telephone Encounter (Signed)
RF: Diabetes, HTN, age, low HDL- LDL goal< 100 at least, non-HDL goal < 130 at least. Meds: Nordic Naturals fish oil 6 g/d.  Intolerance Lipitor, crestor (daily and weekly), Zocor, Zetia, Fibrates (mylgias). Lovastatin (stopped b/c impaired renal fxn. Niacin and Niaspan.  Patient had GI problems and trouble affording Welchol in the past. H/o TG > 500, however these improved on fish oil 6 g/d - TG back up to 400 today though. Last A1C was good at 6.2. Scr 1.4 in past (stable).  She refused to rechallenge any statin or other medicine that could cause pain.  She has a pinched nerve and is having a hard time walking.  Her TG has gone back up despite taking 6 g/d of Nordic Naturals.  Patient agreeable to  Change to Lovaza 4 g/d now that it is generic.  Recheck lipid / liver in 3 months (01/27/14).  Rx sent to [facility].

## 2013-10-22 DIAGNOSIS — R350 Frequency of micturition: Secondary | ICD-10-CM | POA: Diagnosis not present

## 2013-10-22 DIAGNOSIS — E119 Type 2 diabetes mellitus without complications: Secondary | ICD-10-CM | POA: Diagnosis not present

## 2013-10-22 DIAGNOSIS — R7989 Other specified abnormal findings of blood chemistry: Secondary | ICD-10-CM | POA: Diagnosis not present

## 2013-10-22 DIAGNOSIS — N390 Urinary tract infection, site not specified: Secondary | ICD-10-CM | POA: Diagnosis not present

## 2013-10-26 DIAGNOSIS — M48061 Spinal stenosis, lumbar region without neurogenic claudication: Secondary | ICD-10-CM | POA: Diagnosis not present

## 2013-10-29 DIAGNOSIS — E2749 Other adrenocortical insufficiency: Secondary | ICD-10-CM | POA: Diagnosis not present

## 2013-10-29 DIAGNOSIS — M545 Low back pain, unspecified: Secondary | ICD-10-CM | POA: Diagnosis not present

## 2013-10-29 DIAGNOSIS — I10 Essential (primary) hypertension: Secondary | ICD-10-CM | POA: Diagnosis not present

## 2013-10-29 DIAGNOSIS — N390 Urinary tract infection, site not specified: Secondary | ICD-10-CM | POA: Diagnosis not present

## 2013-11-02 DIAGNOSIS — J99 Respiratory disorders in diseases classified elsewhere: Secondary | ICD-10-CM | POA: Diagnosis not present

## 2013-11-02 DIAGNOSIS — D869 Sarcoidosis, unspecified: Secondary | ICD-10-CM | POA: Diagnosis not present

## 2013-11-02 DIAGNOSIS — M48061 Spinal stenosis, lumbar region without neurogenic claudication: Secondary | ICD-10-CM | POA: Diagnosis not present

## 2013-11-02 DIAGNOSIS — Z79899 Other long term (current) drug therapy: Secondary | ICD-10-CM | POA: Diagnosis not present

## 2013-11-12 ENCOUNTER — Encounter (HOSPITAL_COMMUNITY): Payer: Self-pay | Admitting: Pharmacy Technician

## 2013-11-15 NOTE — Pre-Procedure Instructions (Signed)
Sonya Goodwin  11/15/2013   Your procedure is scheduled on:  Thurs, Nov 20 @ 12:30 PM  Report to [facility] Short Stay Entrance A at 10:30 AM.  Call this number if you have problems the morning of surgery: [phone]   Remember:   Do not eat food or drink liquids after midnight.   Take these medicines the morning of surgery with A SIP OF WATER: Eye Drops, Gabapentin(Neurontin),Antivert(Meclizine),Protonix(Pantoprazole),and Tramadol(Ultram)              Stop taking your Aspirin and Fish Oil.No Goody's,BC's,Aleve,Ibuprofen,or any Herbal Medications   Do not wear jewelry, make-up or nail polish.  Do not wear lotions, powders, or perfumes. You may wear deodorant.  Do not shave 48 hours prior to surgery.   Do not bring valuables to the hospital.  [facility] is not responsible                  for any belongings or valuables.               Contacts, dentures or bridgework may not be worn into surgery.  Leave suitcase in the car. After surgery it may be brought to your room.  For patients admitted to the hospital, discharge time is determined by your                treatment team.               Patients discharged the day of surgery will not be allowed to drive  home.    Special Instructions: Shower using CHG 2 nights before surgery and the night before surgery.  If you shower the day of surgery use CHG.  Use special wash - you have one bottle of CHG for all showers.  You should use approximately 1/3 of the bottle for each shower.   Please read over the following fact sheets that you were given: Pain Booklet, Coughing and Deep Breathing, MRSA Information and Surgical Site Infection Prevention

## 2013-11-16 ENCOUNTER — Encounter (HOSPITAL_COMMUNITY)
Admission: RE | Admit: 2013-11-16 | Discharge: 2013-11-16 | Disposition: A | Discharge status: Home / Self Care | Payer: Medicare Other | Source: Ambulatory Visit | Attending: Orthopedic Surgery | Admitting: Orthopedic Surgery

## 2013-11-16 ENCOUNTER — Encounter (HOSPITAL_COMMUNITY)
Admission: RE | Admit: 2013-11-16 | Discharge: 2013-11-16 | Disposition: A | Discharge status: Home / Self Care | Payer: Medicare Other | Source: Ambulatory Visit | Attending: Anesthesiology | Admitting: Anesthesiology

## 2013-11-16 ENCOUNTER — Encounter (HOSPITAL_COMMUNITY): Payer: Self-pay

## 2013-11-16 DIAGNOSIS — M51379 Other intervertebral disc degeneration, lumbosacral region without mention of lumbar back pain or lower extremity pain: Secondary | ICD-10-CM | POA: Diagnosis present

## 2013-11-16 DIAGNOSIS — E2749 Other adrenocortical insufficiency: Secondary | ICD-10-CM | POA: Diagnosis not present

## 2013-11-16 DIAGNOSIS — M069 Rheumatoid arthritis, unspecified: Secondary | ICD-10-CM | POA: Diagnosis present

## 2013-11-16 DIAGNOSIS — K219 Gastro-esophageal reflux disease without esophagitis: Secondary | ICD-10-CM | POA: Diagnosis present

## 2013-11-16 DIAGNOSIS — M48061 Spinal stenosis, lumbar region without neurogenic claudication: Secondary | ICD-10-CM | POA: Diagnosis not present

## 2013-11-16 DIAGNOSIS — M545 Low back pain, unspecified: Secondary | ICD-10-CM | POA: Diagnosis not present

## 2013-11-16 DIAGNOSIS — H9190 Unspecified hearing loss, unspecified ear: Secondary | ICD-10-CM | POA: Diagnosis present

## 2013-11-16 DIAGNOSIS — Z9849 Cataract extraction status, unspecified eye: Secondary | ICD-10-CM | POA: Diagnosis not present

## 2013-11-16 DIAGNOSIS — M7130 Other bursal cyst, unspecified site: Secondary | ICD-10-CM | POA: Diagnosis not present

## 2013-11-16 DIAGNOSIS — Z01812 Encounter for preprocedural laboratory examination: Secondary | ICD-10-CM | POA: Diagnosis not present

## 2013-11-16 DIAGNOSIS — H409 Unspecified glaucoma: Secondary | ICD-10-CM | POA: Diagnosis present

## 2013-11-16 DIAGNOSIS — I129 Hypertensive chronic kidney disease with stage 1 through stage 4 chronic kidney disease, or unspecified chronic kidney disease: Secondary | ICD-10-CM | POA: Diagnosis present

## 2013-11-16 DIAGNOSIS — M48062 Spinal stenosis, lumbar region with neurogenic claudication: Secondary | ICD-10-CM | POA: Diagnosis present

## 2013-11-16 DIAGNOSIS — N189 Chronic kidney disease, unspecified: Secondary | ICD-10-CM | POA: Diagnosis not present

## 2013-11-16 DIAGNOSIS — M47817 Spondylosis without myelopathy or radiculopathy, lumbosacral region: Secondary | ICD-10-CM | POA: Diagnosis not present

## 2013-11-16 DIAGNOSIS — E785 Hyperlipidemia, unspecified: Secondary | ICD-10-CM | POA: Diagnosis present

## 2013-11-16 DIAGNOSIS — M5137 Other intervertebral disc degeneration, lumbosacral region: Secondary | ICD-10-CM | POA: Diagnosis not present

## 2013-11-16 DIAGNOSIS — E119 Type 2 diabetes mellitus without complications: Secondary | ICD-10-CM | POA: Diagnosis not present

## 2013-11-16 DIAGNOSIS — G4733 Obstructive sleep apnea (adult) (pediatric): Secondary | ICD-10-CM | POA: Diagnosis present

## 2013-11-16 DIAGNOSIS — N183 Chronic kidney disease, stage 3 unspecified: Secondary | ICD-10-CM | POA: Diagnosis not present

## 2013-11-16 DIAGNOSIS — Z87442 Personal history of urinary calculi: Secondary | ICD-10-CM | POA: Diagnosis not present

## 2013-11-16 DIAGNOSIS — Z8249 Family history of ischemic heart disease and other diseases of the circulatory system: Secondary | ICD-10-CM | POA: Diagnosis not present

## 2013-11-16 DIAGNOSIS — IMO0002 Reserved for concepts with insufficient information to code with codable children: Secondary | ICD-10-CM | POA: Diagnosis not present

## 2013-11-16 DIAGNOSIS — D869 Sarcoidosis, unspecified: Secondary | ICD-10-CM | POA: Diagnosis not present

## 2013-11-16 DIAGNOSIS — M519 Unspecified thoracic, thoracolumbar and lumbosacral intervertebral disc disorder: Secondary | ICD-10-CM | POA: Diagnosis not present

## 2013-11-16 DIAGNOSIS — Z888 Allergy status to other drugs, medicaments and biological substances status: Secondary | ICD-10-CM | POA: Diagnosis not present

## 2013-11-16 HISTORY — DX: Type 2 diabetes mellitus without complications: E11.9

## 2013-11-16 HISTORY — DX: Other specified postprocedural states: R11.2

## 2013-11-16 HISTORY — DX: Dysphagia, unspecified: R13.10

## 2013-11-16 HISTORY — DX: Unspecified glaucoma: H40.9

## 2013-11-16 HISTORY — DX: Anemia, unspecified: D64.9

## 2013-11-16 HISTORY — DX: Chronic kidney disease, unspecified: N18.9

## 2013-11-16 HISTORY — DX: Sleep apnea, unspecified: G47.30

## 2013-11-16 HISTORY — DX: Unspecified osteoarthritis, unspecified site: M19.90

## 2013-11-16 HISTORY — DX: Unspecified adrenocortical insufficiency: E27.40

## 2013-11-16 HISTORY — DX: Malignant (primary) neoplasm, unspecified: C80.1

## 2013-11-16 HISTORY — DX: Shortness of breath: R06.02

## 2013-11-16 HISTORY — DX: Unspecified hearing loss, right ear: H91.91

## 2013-11-16 HISTORY — DX: Personal history of urinary calculi: Z87.442

## 2013-11-16 HISTORY — DX: Other specified postprocedural states: Z98.890

## 2013-11-16 HISTORY — DX: Essential (primary) hypertension: I10

## 2013-11-16 HISTORY — DX: Sarcoidosis, unspecified: D86.9

## 2013-11-16 HISTORY — DX: Personal history of urinary (tract) infections: Z87.440

## 2013-11-16 HISTORY — DX: Pure hypercholesterolemia, unspecified: E78.00

## 2013-11-16 HISTORY — DX: Gastro-esophageal reflux disease without esophagitis: K21.9

## 2013-11-16 LAB — BASIC METABOLIC PANEL
CO2: 28 mEq/L (ref 19–32)
Calcium: 10 mg/dL (ref 8.4–10.5)
Creatinine, Ser: 1.39 mg/dL — ABNORMAL HIGH (ref 0.50–1.10)
GFR calc non Af Amer: 37 mL/min — ABNORMAL LOW (ref >90)
Sodium: 140 mEq/L (ref 135–145)

## 2013-11-16 LAB — CBC
Hemoglobin: 14.6 g/dL (ref 12.0–15.0)
MCH: 29 pg (ref 26.0–34.0)
MCV: 87.7 fL (ref 78.0–100.0)
Platelets: 226 10*3/uL (ref 150–400)
RBC: 5.04 MIL/uL (ref 3.87–5.11)
RDW: 14.9 % (ref 11.5–15.5)
WBC: 6.5 10*3/uL (ref 4.0–10.5)

## 2013-11-16 LAB — SURGICAL PCR SCREEN: MRSA, PCR: NEGATIVE

## 2013-11-16 NOTE — Progress Notes (Signed)
PCP is Dr. Catha Gosselin.  Cardiologist is Dr. Cherlyn Labella.  Pt reports EKG done recently by PCP, records requested.  Pt sees Pulmonologist-Dr. Guido Sander at [facility], sees q 6 months, last CXR done 2 years ago, records requested.   Pt reports stress test and ECHO about 5 years ago.

## 2013-11-17 ENCOUNTER — Encounter (HOSPITAL_COMMUNITY): Payer: Self-pay

## 2013-11-17 NOTE — Progress Notes (Signed)
Anesthesia chart review:  Patient is a 72 year old female scheduled for L3-5 decompression on 11/18/13 by Dr. Shon Baton.    History includes post-operative N/V, non-smoker, Sarcoidosis (Dr. Guido Sander at [facility]), nephrolithiasis, DM2, GERD, arthritis, deaf in right ear, dysphagia, "mild" OSA, glaucoma, HTN, hypercholesterolemia, cataract extraction, skin cancer, pancreatic cyst (Dr. Emmaline Life insufficiency on Medrol, CKD stage III, skin cancer. PCP is Dr. Catha Gosselin who medically cleared patient for this procedure. He recommended increasing dose of Medrol up to 30 mg a day starting on the day before surgery and continuing during early post-surgical course. Dr. Guido Sander is also aware of plans for surgery.  She has seen cardiologist Dr. Armanda Magic in the past for HTN and hypercholesterolemia, last visit 09/17/12.  Prior stress and echo were 5 years ago.  EKG on 10/29/13 (PCP) showed NSR, diffuse non-specific T wave abnormality.  CXR report on 11/16/13 showed:  Stable cardiomediastinal silhouette. Right peritracheal prominence is noted which is unchanged compared to prior exam. Stable interstitial densities are noted throughout both lungs most consistent with scarring. No pneumothorax or pleural effusion is noted. No acute pulmonary disease is noted. Bony thorax is intact.  PFTs on 11/02/13 [facility]) showed: FVC is 2.52 (85%), FEV1 of 1.70 (76%), FEV1 percent is 68, total lung capacity 4.49 (86%), DLCO 13.0 (73%).  Preoperative labs noted.  K 2.8.  This was already called to [facility] at Dr. Shon Baton office yesterday for further recommendations.  ISTAT on arrival to re-evaluate for hypokalemia.  If K is at least 3.0 then I would anticipate that she could proceed as planned.   Velna Ochs [facility] Short Stay Center/Anesthesiology Phone [phone] 11/17/2013 11:18 AM

## 2013-11-18 ENCOUNTER — Encounter (HOSPITAL_COMMUNITY)
Admission: RE | Disposition: A | Discharge status: Home / Self Care | Payer: Self-pay | Source: Ambulatory Visit | Attending: Internal Medicine

## 2013-11-18 ENCOUNTER — Inpatient Hospital Stay (HOSPITAL_COMMUNITY)
Admission: RE | Admit: 2013-11-18 | Discharge: 2013-11-21 | DRG: 519 | Disposition: A | Discharge status: Home / Self Care | Payer: Medicare Other | Source: Ambulatory Visit | Attending: Internal Medicine | Admitting: Internal Medicine

## 2013-11-18 ENCOUNTER — Inpatient Hospital Stay (HOSPITAL_COMMUNITY): Payer: Medicare Other | Admitting: Anesthesiology

## 2013-11-18 ENCOUNTER — Inpatient Hospital Stay (HOSPITAL_COMMUNITY): Payer: Medicare Other

## 2013-11-18 ENCOUNTER — Encounter (HOSPITAL_COMMUNITY): Payer: Medicare Other | Admitting: Vascular Surgery

## 2013-11-18 ENCOUNTER — Encounter (HOSPITAL_COMMUNITY): Payer: Self-pay | Admitting: *Deleted

## 2013-11-18 DIAGNOSIS — M48062 Spinal stenosis, lumbar region with neurogenic claudication: Secondary | ICD-10-CM | POA: Diagnosis present

## 2013-11-18 DIAGNOSIS — M51379 Other intervertebral disc degeneration, lumbosacral region without mention of lumbar back pain or lower extremity pain: Principal | ICD-10-CM | POA: Diagnosis present

## 2013-11-18 DIAGNOSIS — IMO0002 Reserved for concepts with insufficient information to code with codable children: Secondary | ICD-10-CM

## 2013-11-18 DIAGNOSIS — D869 Sarcoidosis, unspecified: Secondary | ICD-10-CM | POA: Diagnosis present

## 2013-11-18 DIAGNOSIS — M069 Rheumatoid arthritis, unspecified: Secondary | ICD-10-CM | POA: Diagnosis present

## 2013-11-18 DIAGNOSIS — E7800 Pure hypercholesterolemia, unspecified: Secondary | ICD-10-CM

## 2013-11-18 DIAGNOSIS — N183 Chronic kidney disease, stage 3 unspecified: Secondary | ICD-10-CM | POA: Diagnosis present

## 2013-11-18 DIAGNOSIS — E2749 Other adrenocortical insufficiency: Secondary | ICD-10-CM | POA: Diagnosis present

## 2013-11-18 DIAGNOSIS — H409 Unspecified glaucoma: Secondary | ICD-10-CM | POA: Diagnosis present

## 2013-11-18 DIAGNOSIS — N1831 Chronic kidney disease, stage 3a: Secondary | ICD-10-CM | POA: Diagnosis present

## 2013-11-18 DIAGNOSIS — G4733 Obstructive sleep apnea (adult) (pediatric): Secondary | ICD-10-CM | POA: Diagnosis present

## 2013-11-18 DIAGNOSIS — Z888 Allergy status to other drugs, medicaments and biological substances status: Secondary | ICD-10-CM

## 2013-11-18 DIAGNOSIS — E2740 Unspecified adrenocortical insufficiency: Secondary | ICD-10-CM

## 2013-11-18 DIAGNOSIS — Z9849 Cataract extraction status, unspecified eye: Secondary | ICD-10-CM

## 2013-11-18 DIAGNOSIS — H9190 Unspecified hearing loss, unspecified ear: Secondary | ICD-10-CM | POA: Diagnosis present

## 2013-11-18 DIAGNOSIS — I10 Essential (primary) hypertension: Secondary | ICD-10-CM

## 2013-11-18 DIAGNOSIS — M7130 Other bursal cyst, unspecified site: Secondary | ICD-10-CM | POA: Diagnosis present

## 2013-11-18 DIAGNOSIS — K219 Gastro-esophageal reflux disease without esophagitis: Secondary | ICD-10-CM | POA: Diagnosis present

## 2013-11-18 DIAGNOSIS — Z8249 Family history of ischemic heart disease and other diseases of the circulatory system: Secondary | ICD-10-CM

## 2013-11-18 DIAGNOSIS — E785 Hyperlipidemia, unspecified: Secondary | ICD-10-CM | POA: Diagnosis present

## 2013-11-18 DIAGNOSIS — E1122 Type 2 diabetes mellitus with diabetic chronic kidney disease: Secondary | ICD-10-CM | POA: Diagnosis present

## 2013-11-18 DIAGNOSIS — N189 Chronic kidney disease, unspecified: Secondary | ICD-10-CM

## 2013-11-18 DIAGNOSIS — Z9889 Other specified postprocedural states: Secondary | ICD-10-CM

## 2013-11-18 DIAGNOSIS — M5137 Other intervertebral disc degeneration, lumbosacral region: Principal | ICD-10-CM | POA: Diagnosis present

## 2013-11-18 DIAGNOSIS — Z01812 Encounter for preprocedural laboratory examination: Secondary | ICD-10-CM

## 2013-11-18 DIAGNOSIS — E119 Type 2 diabetes mellitus without complications: Secondary | ICD-10-CM

## 2013-11-18 DIAGNOSIS — Z87442 Personal history of urinary calculi: Secondary | ICD-10-CM

## 2013-11-18 DIAGNOSIS — I129 Hypertensive chronic kidney disease with stage 1 through stage 4 chronic kidney disease, or unspecified chronic kidney disease: Secondary | ICD-10-CM | POA: Diagnosis present

## 2013-11-18 HISTORY — PX: LUMBAR LAMINECTOMY/DECOMPRESSION MICRODISCECTOMY: SHX5026

## 2013-11-18 LAB — GLUCOSE, CAPILLARY
Glucose-Capillary: 117 mg/dL — ABNORMAL HIGH (ref 70–99)
Glucose-Capillary: 123 mg/dL — ABNORMAL HIGH (ref 70–99)

## 2013-11-18 LAB — POCT I-STAT 4, (NA,K, GLUC, HGB,HCT)
Glucose, Bld: 107 mg/dL — ABNORMAL HIGH (ref 70–99)
HCT: 39 % (ref 36.0–46.0)
Hemoglobin: 13.3 g/dL (ref 12.0–15.0)
Potassium: 3.8 mEq/L (ref 3.5–5.1)
Sodium: 139 mEq/L (ref 135–145)

## 2013-11-18 SURGERY — LUMBAR LAMINECTOMY/DECOMPRESSION MICRODISCECTOMY 2 LEVELS
Anesthesia: General | Site: Spine Lumbar | Wound class: Clean

## 2013-11-18 MED ORDER — PIOGLITAZONE HCL 15 MG PO TABS
15.0000 mg | ORAL_TABLET | Freq: Every day | ORAL | Status: DC
  Administered 2013-11-19 – 2013-11-21 (×3): 15 mg via ORAL
  Filled 2013-11-18 (×4): qty 1

## 2013-11-18 MED ORDER — SODIUM CHLORIDE 0.9 % IJ SOLN
3.0000 mL | Freq: Two times a day (BID) | INTRAMUSCULAR | Status: DC
  Administered 2013-11-19 – 2013-11-20 (×3): 3 mL via INTRAVENOUS

## 2013-11-18 MED ORDER — FLEET ENEMA 7-19 GM/118ML RE ENEM: 1.0000 | ENEMA | Freq: Once | RECTAL | Status: AC | PRN

## 2013-11-18 MED ORDER — METHOCARBAMOL 500 MG PO TABS
500.0000 mg | ORAL_TABLET | Freq: Four times a day (QID) | ORAL | Status: DC | PRN
  Administered 2013-11-18 – 2013-11-21 (×7): 500 mg via ORAL
  Filled 2013-11-18 (×7): qty 1

## 2013-11-18 MED ORDER — ZOLPIDEM TARTRATE 5 MG PO TABS: 5.0000 mg | ORAL_TABLET | Freq: Every evening | ORAL | Status: DC | PRN

## 2013-11-18 MED ORDER — VANCOMYCIN HCL 1000 MG IV SOLR
INTRAVENOUS | Status: AC
  Filled 2013-11-18: qty 1000

## 2013-11-18 MED ORDER — GABAPENTIN 100 MG PO CAPS
100.0000 mg | ORAL_CAPSULE | Freq: Two times a day (BID) | ORAL | Status: DC
  Administered 2013-11-19 – 2013-11-21 (×5): 100 mg via ORAL
  Filled 2013-11-18 (×6): qty 1

## 2013-11-18 MED ORDER — VANCOMYCIN HCL IN DEXTROSE 1-5 GM/200ML-% IV SOLN
1000.0000 mg | Freq: Two times a day (BID) | INTRAVENOUS | Status: DC
Start: 2013-11-18 — End: 2013-11-18

## 2013-11-18 MED ORDER — LACTATED RINGERS IV SOLN: INTRAVENOUS | Status: DC

## 2013-11-18 MED ORDER — CEFAZOLIN SODIUM 1-5 GM-% IV SOLN
1.0000 g | Freq: Three times a day (TID) | INTRAVENOUS | Status: AC
  Administered 2013-11-18 – 2013-11-19 (×2): 1 g via INTRAVENOUS
  Filled 2013-11-18 (×2): qty 50

## 2013-11-18 MED ORDER — LIDOCAINE HCL (CARDIAC) 20 MG/ML IV SOLN
INTRAVENOUS | Status: DC | PRN
  Administered 2013-11-18: 30 mg via INTRAVENOUS

## 2013-11-18 MED ORDER — HEMOSTATIC AGENTS (NO CHARGE) OPTIME
TOPICAL | Status: DC | PRN
  Administered 2013-11-18: 1 via TOPICAL

## 2013-11-18 MED ORDER — ACETAMINOPHEN 10 MG/ML IV SOLN
1000.0000 mg | Freq: Four times a day (QID) | INTRAVENOUS | Status: DC
  Administered 2013-11-18: 1000 mg via INTRAVENOUS

## 2013-11-18 MED ORDER — ONDANSETRON HCL 4 MG/2ML IJ SOLN
INTRAMUSCULAR | Status: DC | PRN
  Administered 2013-11-18: 4 mg via INTRAVENOUS

## 2013-11-18 MED ORDER — GABAPENTIN 300 MG PO CAPS
600.0000 mg | ORAL_CAPSULE | Freq: Every day | ORAL | Status: DC
  Administered 2013-11-18 – 2013-11-20 (×3): 600 mg via ORAL
  Filled 2013-11-18 (×4): qty 2

## 2013-11-18 MED ORDER — NEOSTIGMINE METHYLSULFATE 1 MG/ML IJ SOLN
INTRAMUSCULAR | Status: DC | PRN
  Administered 2013-11-18: 3 mg via INTRAVENOUS

## 2013-11-18 MED ORDER — ONDANSETRON HCL 4 MG/2ML IJ SOLN: 4.0000 mg | INTRAMUSCULAR | Status: DC | PRN

## 2013-11-18 MED ORDER — METHYLPREDNISOLONE 2 MG PO TABS: 2.0000 mg | ORAL_TABLET | Freq: Every day | ORAL | Status: DC

## 2013-11-18 MED ORDER — SCOPOLAMINE 1 MG/3DAYS TD PT72
1.0000 | MEDICATED_PATCH | TRANSDERMAL | Status: AC
  Administered 2013-11-18: 1 via TRANSDERMAL
  Filled 2013-11-18: qty 1

## 2013-11-18 MED ORDER — DOCUSATE SODIUM 100 MG PO CAPS
100.0000 mg | ORAL_CAPSULE | Freq: Two times a day (BID) | ORAL | Status: DC
  Administered 2013-11-18 – 2013-11-21 (×6): 100 mg via ORAL
  Filled 2013-11-18 (×6): qty 1

## 2013-11-18 MED ORDER — TRIAMTERENE-HCTZ 75-50 MG PO TABS
1.0000 | ORAL_TABLET | Freq: Every day | ORAL | Status: DC
  Administered 2013-11-19 – 2013-11-21 (×3): 1 via ORAL
  Filled 2013-11-18 (×3): qty 1

## 2013-11-18 MED ORDER — CEFAZOLIN SODIUM-DEXTROSE 2-3 GM-% IV SOLR
INTRAVENOUS | Status: AC
  Filled 2013-11-18: qty 50

## 2013-11-18 MED ORDER — GLYCOPYRROLATE 0.2 MG/ML IJ SOLN
INTRAMUSCULAR | Status: DC | PRN
  Administered 2013-11-18: 0.4 mg via INTRAVENOUS

## 2013-11-18 MED ORDER — FENTANYL CITRATE 0.05 MG/ML IJ SOLN
INTRAMUSCULAR | Status: DC | PRN
  Administered 2013-11-18: 50 ug via INTRAVENOUS
  Administered 2013-11-18 (×3): 100 ug via INTRAVENOUS

## 2013-11-18 MED ORDER — ROCURONIUM BROMIDE 100 MG/10ML IV SOLN
INTRAVENOUS | Status: DC | PRN
  Administered 2013-11-18: 30 mg via INTRAVENOUS

## 2013-11-18 MED ORDER — PREDNISONE 20 MG PO TABS
30.0000 mg | ORAL_TABLET | Freq: Every day | ORAL | Status: DC
  Administered 2013-11-19: 30 mg via ORAL
  Filled 2013-11-18 (×2): qty 1

## 2013-11-18 MED ORDER — TRAMADOL HCL 50 MG PO TABS
50.0000 mg | ORAL_TABLET | ORAL | Status: DC | PRN
Start: 2013-11-18 — End: 2013-11-21
  Administered 2013-11-18 – 2013-11-21 (×9): 50 mg via ORAL
  Filled 2013-11-18 (×9): qty 1

## 2013-11-18 MED ORDER — VANCOMYCIN HCL 1000 MG IV SOLR
INTRAVENOUS | Status: DC | PRN
  Administered 2013-11-18: 1000 mg

## 2013-11-18 MED ORDER — AMITRIPTYLINE HCL 25 MG PO TABS
25.0000 mg | ORAL_TABLET | Freq: Every day | ORAL | Status: DC
  Administered 2013-11-18 – 2013-11-20 (×3): 25 mg via ORAL
  Filled 2013-11-18 (×4): qty 1

## 2013-11-18 MED ORDER — SODIUM CHLORIDE 0.9 % IV SOLN
INTRAVENOUS | Status: DC
  Administered 2013-11-18 (×3): via INTRAVENOUS

## 2013-11-18 MED ORDER — VANCOMYCIN HCL IN DEXTROSE 1-5 GM/200ML-% IV SOLN
INTRAVENOUS | Status: AC
  Filled 2013-11-18: qty 200

## 2013-11-18 MED ORDER — THROMBIN 20000 UNITS EX SOLR
CUTANEOUS | Status: DC | PRN
  Administered 2013-11-18: 15:00:00 via TOPICAL

## 2013-11-18 MED ORDER — ONDANSETRON HCL 4 MG/2ML IJ SOLN: 4.0000 mg | Freq: Once | INTRAMUSCULAR | Status: DC | PRN

## 2013-11-18 MED ORDER — BRINZOLAMIDE 1 % OP SUSP
1.0000 [drp] | Freq: Two times a day (BID) | OPHTHALMIC | Status: DC
  Administered 2013-11-18 – 2013-11-21 (×4): 1 [drp] via OPHTHALMIC
  Filled 2013-11-18 (×2): qty 10

## 2013-11-18 MED ORDER — HYDROMORPHONE HCL PF 1 MG/ML IJ SOLN: 0.2500 mg | INTRAMUSCULAR | Status: DC | PRN

## 2013-11-18 MED ORDER — PROPOFOL 10 MG/ML IV BOLUS
INTRAVENOUS | Status: DC | PRN
  Administered 2013-11-18: 70 mg via INTRAVENOUS

## 2013-11-18 MED ORDER — DEXTROSE 5 % IV SOLN
500.0000 mg | Freq: Four times a day (QID) | INTRAVENOUS | Status: DC | PRN
  Filled 2013-11-18: qty 5

## 2013-11-18 MED ORDER — BUPIVACAINE-EPINEPHRINE PF 0.25-1:200000 % IJ SOLN
INTRAMUSCULAR | Status: DC | PRN
  Administered 2013-11-18: 10 mL

## 2013-11-18 MED ORDER — ACETAMINOPHEN 10 MG/ML IV SOLN
INTRAVENOUS | Status: AC
  Filled 2013-11-18: qty 100

## 2013-11-18 MED ORDER — SODIUM CHLORIDE 0.9 % IJ SOLN: 3.0000 mL | INTRAMUSCULAR | Status: DC | PRN

## 2013-11-18 MED ORDER — ACETAMINOPHEN 10 MG/ML IV SOLN
1000.0000 mg | Freq: Four times a day (QID) | INTRAVENOUS | Status: AC
  Administered 2013-11-19 (×3): 1000 mg via INTRAVENOUS
  Filled 2013-11-18 (×4): qty 100

## 2013-11-18 MED ORDER — 0.9 % SODIUM CHLORIDE (POUR BTL) OPTIME
TOPICAL | Status: DC | PRN
  Administered 2013-11-18: 1000 mL

## 2013-11-18 MED ORDER — INSULIN ASPART 100 UNIT/ML ~~LOC~~ SOLN
0.0000 [IU] | Freq: Three times a day (TID) | SUBCUTANEOUS | Status: DC
  Administered 2013-11-19: 3 [IU] via SUBCUTANEOUS

## 2013-11-18 MED ORDER — CEFAZOLIN SODIUM-DEXTROSE 2-3 GM-% IV SOLR
2.0000 g | INTRAVENOUS | Status: AC
  Administered 2013-11-18: 2 g via INTRAVENOUS

## 2013-11-18 MED ORDER — SODIUM CHLORIDE 0.9 % IV SOLN: 250.0000 mL | INTRAVENOUS | Status: DC

## 2013-11-18 MED ORDER — THROMBIN 20000 UNITS EX SOLR
CUTANEOUS | Status: AC
  Filled 2013-11-18: qty 20000

## 2013-11-18 SURGICAL SUPPLY — 57 items
BANDAGE GAUZE ELAST BULKY 4 IN (GAUZE/BANDAGES/DRESSINGS) ×2 IMPLANT
BUR EGG ELITE 4.0 (BURR) ×2 IMPLANT
CLOTH BEACON ORANGE TIMEOUT ST (SAFETY) ×2 IMPLANT
CLSR STERI-STRIP ANTIMIC 1/2X4 (GAUZE/BANDAGES/DRESSINGS) ×2 IMPLANT
CORDS BIPOLAR (ELECTRODE) ×2 IMPLANT
DRAPE C-ARM 42X72 X-RAY (DRAPES) IMPLANT
DRAPE POUCH INSTRU U-SHP 10X18 (DRAPES) ×2 IMPLANT
DRAPE PROXIMA HALF (DRAPES) ×2 IMPLANT
DRAPE SURG 17X11 SM STRL (DRAPES) ×2 IMPLANT
DRAPE U-SHAPE 47X51 STRL (DRAPES) ×2 IMPLANT
DRSG MEPILEX BORDER 4X4 (GAUZE/BANDAGES/DRESSINGS) IMPLANT
DRSG MEPILEX BORDER 4X8 (GAUZE/BANDAGES/DRESSINGS) ×2 IMPLANT
DURAPREP 26ML APPLICATOR (WOUND CARE) ×2 IMPLANT
ELECT BLADE 4.0 EZ CLEAN MEGAD (MISCELLANEOUS) ×2
ELECT CAUTERY BLADE 6.4 (BLADE) ×2 IMPLANT
ELECT REM PT RETURN 9FT ADLT (ELECTROSURGICAL) ×2
ELECTRODE BLDE 4.0 EZ CLN MEGD (MISCELLANEOUS) ×1 IMPLANT
ELECTRODE REM PT RTRN 9FT ADLT (ELECTROSURGICAL) ×1 IMPLANT
FLOSEAL (HEMOSTASIS) IMPLANT
GLOVE BIOGEL PI IND STRL 8 (GLOVE) ×1 IMPLANT
GLOVE BIOGEL PI IND STRL 8.5 (GLOVE) ×1 IMPLANT
GLOVE BIOGEL PI INDICATOR 8 (GLOVE) ×1
GLOVE BIOGEL PI INDICATOR 8.5 (GLOVE) ×1
GLOVE ECLIPSE 8.5 STRL (GLOVE) ×2 IMPLANT
GLOVE ORTHO TXT STRL SZ7.5 (GLOVE) ×2 IMPLANT
GOWN PREVENTION PLUS XXLARGE (GOWN DISPOSABLE) ×2 IMPLANT
GOWN STRL NON-REIN LRG LVL3 (GOWN DISPOSABLE) ×2 IMPLANT
GOWN STRL REIN 2XL XLG LVL4 (GOWN DISPOSABLE) ×2 IMPLANT
KIT BASIN OR (CUSTOM PROCEDURE TRAY) ×2 IMPLANT
KIT STIMULAN RAPID CURE  10CC (Orthopedic Implant) ×1 IMPLANT
KIT STIMULAN RAPID CURE 10CC (Orthopedic Implant) ×1 IMPLANT
NEEDLE 22X1 1/2 (OR ONLY) (NEEDLE) ×2 IMPLANT
NEEDLE SPNL 18GX3.5 QUINCKE PK (NEEDLE) ×4 IMPLANT
NS IRRIG 1000ML POUR BTL (IV SOLUTION) ×2 IMPLANT
PACK LAMINECTOMY ORTHO (CUSTOM PROCEDURE TRAY) ×2 IMPLANT
PACK UNIVERSAL I (CUSTOM PROCEDURE TRAY) ×2 IMPLANT
PATTIES SURGICAL .5 X.5 (GAUZE/BANDAGES/DRESSINGS) ×2 IMPLANT
PATTIES SURGICAL .5 X1 (DISPOSABLE) ×4 IMPLANT
SPONGE LAP 4X18 X RAY DECT (DISPOSABLE) IMPLANT
SPONGE SURGIFOAM ABS GEL 100 (HEMOSTASIS) ×2 IMPLANT
STAPLER VISISTAT 35W (STAPLE) IMPLANT
STRIP CLOSURE SKIN 1/2X4 (GAUZE/BANDAGES/DRESSINGS) IMPLANT
SURGIFLO TRUKIT (HEMOSTASIS) ×2 IMPLANT
SUT MON AB 3-0 SH 27 (SUTURE) ×2
SUT MON AB 3-0 SH27 (SUTURE) ×2 IMPLANT
SUT VIC AB 1 CT1 18XCR BRD 8 (SUTURE) ×1 IMPLANT
SUT VIC AB 1 CT1 27 (SUTURE) ×2
SUT VIC AB 1 CT1 27XBRD ANTBC (SUTURE) ×2 IMPLANT
SUT VIC AB 1 CT1 8-18 (SUTURE) ×1
SUT VIC AB 2-0 CT1 18 (SUTURE) ×4 IMPLANT
SUT VICRYL 0 UR6 27IN ABS (SUTURE) ×2 IMPLANT
SYR BULB IRRIGATION 50ML (SYRINGE) ×2 IMPLANT
SYR CONTROL 10ML LL (SYRINGE) ×6 IMPLANT
TOWEL OR 17X26 10 PK STRL BLUE (TOWEL DISPOSABLE) ×4 IMPLANT
TRAY FOLEY CATH 16FRSI W/METER (SET/KITS/TRAYS/PACK) ×2 IMPLANT
WATER STERILE IRR 1000ML POUR (IV SOLUTION) IMPLANT
YANKAUER SUCT BULB TIP NO VENT (SUCTIONS) ×2 IMPLANT

## 2013-11-18 NOTE — H&P (Signed)
History of Present Illness The patient is a 72 year old female who presents to the practice today for a transition into care. The patient is transitioning into care from another physician Sheran Luz, MD) .  Additional reason for visit:  Back painis described as the following: symptoms including low back pain, spasms and tightness which began 2 month(s) ago without any known injury. and Symptoms include pain (right leg to the great toe for about 2 months). The pain radiates to the right lateral thigh, right lateral lower leg and right foot. The patient describes the pain as sharp, dull, burning, aching, tingling and throbbing. The patient describes the severity of their symptoms as severe. The patient feels as if the symptoms are worsening. Symptoms are exacerbated by standing, sitting, recumbency, lifting, bending, straining, direct pressure and urination. Current treatment includes non-opioid analgesics (Tramadol). Prior to being seen today the patient was previously evaluated by a colleague.   Subjective Transcription  REASON FOR CONSULTATION:  A two month history of progressive back, buttock, and mainly right leg pain.  HISTORY OF PRESENT ILLNESS:  Sonya Goodwin is a very pleasant young woman who has been suffering acutely with severe back pain over the last two months. She has always had on again, off again back problems, but it has gotten significantly worse in the very recent past. She cannot recall any injury or trauma. Her quality of life continues to deteriorate.   Allergies CODEINE. 08/14/2000    Social History Living situation. live with spouse Illicit drug use. no Marital status. married Pain Contract. no Number of flights of stairs before winded. 1 Current work status. retired Copywriter, advertising. 2 Drug/Alcohol Rehab (Currently). no Exercise. Exercises never Drug/Alcohol Rehab (Previously). no    Medication History Nucynta (50MG  Tablet, 1  (one) Tablet Oral four times daily, as needed, Taken starting 10/14/2013) Active. Promethazine HCl (12.5MG  Tablet, 1 (one) Tablet Oral every six hours, as needed, Taken starting 10/14/2013) Active. Vitamin D3 (1000UNIT Capsule, Oral) Active. Doxazosin Mesylate (8MG  Tablet, Oral) Active. Gabapentin (100MG  Capsule, Oral) Active. Losartan Potassium (25MG  Tablet, Oral) Active. Meclizine HCl (25MG  Capsule, Oral) Active. MethylPREDNISolone (4MG  Tablet, Oral) Active. Multiple Vitamins/Womens (1 (one) Oral) Active. Omega 3 (1000MG  Capsule, Oral) Active. Pantoprazole Sodium (40MG  Packet, Oral) Active. Pioglitazone HCl (15MG  Tablet, Oral) Active. Sucralfate (1GM Tablet, Oral) Active. Vitamin E (400UNIT Capsule, Oral) Active. TraMADol HCl (50MG  Tablet, Oral) Active. Triamterene-HCTZ (75-50MG  Tablet, Oral) Active. Amitriptyline HCl (25MG  Tablet, Oral) Active. Azopt (1% Suspension, Ophthalmic) Active. Vitamin B Complex (1 (one) Oral) Active. Oscal 500/200 D-3 (500-200MG -UNIT Tablet, Oral) Active. Medications Reconciled.    Objective Transcription  On clinical exam she is a pleasant woman who appears younger than her stated age. She is alert and oriented times three. She has no chest pain or shortness of breath at present. She has difficulty walking without a cane. She has horrific pain when she arises from a seated position. Pain with both forward flexion and extension. No obvious skin lesions, abrasions, or contusions to the lumbar spine. She has diffuse lower extremity joint pain that is diminished, but intact peripheral pulses. Negative Babinski test. No clonus. Symmetrical diminished 1+ deep tendon reflexes.    RADIOGRAPHS:  MRI 10/07/13 demonstrates severe spinal stenosis at L3-4 and L4-5 with right sided foraminal compromise due to cyst formation from the facet L5-S1 shows mild to moderate degenerative disease. There is also a grade I slip at L2-3, but only moderate left  foraminal compression.    Assessment & Plan  At this point in  time I think the major source of her back, buttock, and severe right leg pain is the L3-4, L4-5 level. At this point in time she has multiple medical issues that we would have to consider if we were going to proceed with surgical decompression. My only solution to her pain would be multilevel lumbar decompression, definitely 3-4, 4-5 and potentially 2-3. I do not think instrumented fusion is going to be required. I have given her and her husband some literature about the decompressive procedure. She will contemplate her issues. I did tell her that if she becomes incontinent of bowel and bladder she needs to emergently go to the emergency room. I will have her follow up with me on an as needed basis.  Add: Cleared by PCP.  K level wnl.  Steroid taper started as per PCP

## 2013-11-18 NOTE — Anesthesia Procedure Notes (Signed)
Procedure Name: Intubation Date/Time: 11/18/2013 1:25 PM Performed by: Arlice Colt B Pre-anesthesia Checklist: Patient identified, Emergency Drugs available, Suction available, Patient being monitored and Timeout performed Patient Re-evaluated:Patient Re-evaluated prior to inductionOxygen Delivery Method: Circle system utilized Preoxygenation: Pre-oxygenation with 100% oxygen Intubation Type: IV induction Ventilation: Mask ventilation without difficulty Laryngoscope Size: Mac and 4 Grade View: Grade I Tube type: Oral Tube size: 7.5 mm Number of attempts: 1 Airway Equipment and Method: Stylet Placement Confirmation: ETT inserted through vocal cords under direct vision and positive ETCO2 Secured at: 21 cm Tube secured with: Tape Dental Injury: Teeth and Oropharynx as per pre-operative assessment

## 2013-11-18 NOTE — Anesthesia Preprocedure Evaluation (Addendum)
Anesthesia Evaluation  Patient identified by MRN, date of birth, ID band Patient awake and Patient confused    Reviewed: Allergy & Precautions, H&P , NPO status , Patient's Chart, lab work & pertinent test results  Airway Mallampati: II TM Distance: >3 FB Neck ROM: Full    Dental  (+) Teeth Intact and Dental Advisory Given   Pulmonary  breath sounds clear to auscultation        Cardiovascular hypertension, Rhythm:Regular Rate:Normal     Neuro/Psych    GI/Hepatic   Endo/Other    Renal/GU      Musculoskeletal   Abdominal   Peds  Hematology   Anesthesia Other Findings   Reproductive/Obstetrics                         Anesthesia Physical Anesthesia Plan  ASA: III  Anesthesia Plan: General   Post-op Pain Management:    Induction: Intravenous  Airway Management Planned: Oral ETT  Additional Equipment:   Intra-op Plan:   Post-operative Plan:   Informed Consent: I have reviewed the patients History and Physical, chart, labs and discussed the procedure including the risks, benefits and alternatives for the proposed anesthesia with the patient or authorized representative who has indicated his/her understanding and acceptance.   Dental advisory given  Plan Discussed with: CRNA and Anesthesiologist  Anesthesia Plan Comments:         Anesthesia Quick Evaluation

## 2013-11-18 NOTE — Anesthesia Postprocedure Evaluation (Signed)
  Anesthesia Post-op Note  Patient: Sonya Goodwin  Procedure(s) Performed: Procedure(s): DECOMPRESSION L3 - L5  2 LEVELS (N/A)  Patient Location: PACU  Anesthesia Type:General  Level of Consciousness: awake  Airway and Oxygen Therapy: Patient Spontanous Breathing  Post-op Pain: mild  Post-op Assessment: Post-op Vital signs reviewed  Post-op Vital Signs: Reviewed  Complications: No apparent anesthesia complications

## 2013-11-18 NOTE — Transfer of Care (Signed)
Immediate Anesthesia Transfer of Care Note  Patient: Sonya Goodwin  Procedure(s) Performed: Procedure(s): DECOMPRESSION L3 - L5  2 LEVELS (N/A)  Patient Location: PACU  Anesthesia Type:General  Level of Consciousness: awake, alert , oriented and patient cooperative  Airway & Oxygen Therapy: Patient Spontanous Breathing and Patient connected to nasal cannula oxygen  Post-op Assessment: Report given to PACU RN, Post -op Vital signs reviewed and stable and Patient moving all extremities X 4  Post vital signs: Reviewed and stable  Complications: No apparent anesthesia complications

## 2013-11-18 NOTE — Brief Op Note (Signed)
11/18/2013  3:49 PM  PATIENT:  Sonya Goodwin  72 y.o. female  PRE-OPERATIVE DIAGNOSIS:  spinal stenosis L3 - L5 with Synovial Cyst  POST-OPERATIVE DIAGNOSIS:  spinal stenosis L3 - L5 with Synovial Cyst  PROCEDURE:  Procedure(s): DECOMPRESSION L3 - L5  2 LEVELS (N/A)  SURGEON:  Surgeon(s) and Role:    * Venita Lick, MD - Primary  PHYSICIAN ASSISTANT:   ASSISTANTS: Zonia Kief   ANESTHESIA:   general  EBL:  Total I/O In: 1000 [I.V.:1000] Out: 600 [Urine:400; Blood:200]  BLOOD ADMINISTERED:none  DRAINS: none   LOCAL MEDICATIONS USED:  MARCAINE     SPECIMEN:  No Specimen  DISPOSITION OF SPECIMEN:  N/A  COUNTS:  YES  TOURNIQUET:  * No tourniquets in log *  DICTATION: .Other Dictation: Dictation Number [id number]  PLAN OF CARE: Admit to inpatient   PATIENT DISPOSITION:  PACU - hemodynamically stable.

## 2013-11-19 ENCOUNTER — Encounter (HOSPITAL_COMMUNITY): Payer: Self-pay | Admitting: Orthopedic Surgery

## 2013-11-19 DIAGNOSIS — D869 Sarcoidosis, unspecified: Secondary | ICD-10-CM

## 2013-11-19 DIAGNOSIS — N189 Chronic kidney disease, unspecified: Secondary | ICD-10-CM

## 2013-11-19 DIAGNOSIS — E1122 Type 2 diabetes mellitus with diabetic chronic kidney disease: Secondary | ICD-10-CM | POA: Diagnosis present

## 2013-11-19 DIAGNOSIS — E119 Type 2 diabetes mellitus without complications: Secondary | ICD-10-CM

## 2013-11-19 DIAGNOSIS — I10 Essential (primary) hypertension: Secondary | ICD-10-CM

## 2013-11-19 DIAGNOSIS — N1831 Chronic kidney disease, stage 3a: Secondary | ICD-10-CM | POA: Diagnosis present

## 2013-11-19 DIAGNOSIS — E7800 Pure hypercholesterolemia, unspecified: Secondary | ICD-10-CM

## 2013-11-19 DIAGNOSIS — E2740 Unspecified adrenocortical insufficiency: Secondary | ICD-10-CM | POA: Diagnosis present

## 2013-11-19 DIAGNOSIS — E2749 Other adrenocortical insufficiency: Secondary | ICD-10-CM

## 2013-11-19 LAB — BASIC METABOLIC PANEL
CO2: 29 mEq/L (ref 19–32)
Calcium: 9.9 mg/dL (ref 8.4–10.5)
GFR calc Af Amer: 46 mL/min — ABNORMAL LOW (ref >90)
GFR calc non Af Amer: 40 mL/min — ABNORMAL LOW (ref >90)
Potassium: 3.7 mEq/L (ref 3.5–5.1)
Sodium: 140 mEq/L (ref 135–145)

## 2013-11-19 LAB — CBC
HCT: 35.3 % — ABNORMAL LOW (ref 36.0–46.0)
MCH: 29 pg (ref 26.0–34.0)
MCHC: 32.9 g/dL (ref 30.0–36.0)
MCV: 88.3 fL (ref 78.0–100.0)
Platelets: 197 10*3/uL (ref 150–400)
RDW: 15.2 % (ref 11.5–15.5)

## 2013-11-19 LAB — GLUCOSE, CAPILLARY
Glucose-Capillary: 90 mg/dL (ref 70–99)
Glucose-Capillary: 101 mg/dL — ABNORMAL HIGH (ref 70–99)

## 2013-11-19 MED ORDER — HYDRALAZINE HCL 20 MG/ML IJ SOLN: 10.0000 mg | Freq: Four times a day (QID) | INTRAMUSCULAR | Status: DC | PRN

## 2013-11-19 MED ORDER — PREDNISONE 10 MG PO TABS
10.0000 mg | ORAL_TABLET | Freq: Once | ORAL | Status: AC
  Administered 2013-11-21: 10 mg via ORAL
  Filled 2013-11-19: qty 1

## 2013-11-19 MED ORDER — PREDNISONE 20 MG PO TABS
20.0000 mg | ORAL_TABLET | Freq: Once | ORAL | Status: AC
  Administered 2013-11-20: 20 mg via ORAL
  Filled 2013-11-19: qty 1

## 2013-11-19 MED ORDER — TRAMADOL HCL 50 MG PO TABS: 50.0000 mg | ORAL_TABLET | Freq: Four times a day (QID) | ORAL | Status: AC | PRN

## 2013-11-19 NOTE — Progress Notes (Signed)
    Subjective: Procedure(s) (LRB): DECOMPRESSION L3 - L5  2 LEVELS (N/A) 1 Day Post-Op  Patient reports pain as 3 on 0-10 scale.  Reports decreased leg pain reports incisional back pain   Positive void Negative bowel movement Positive flatus Negative chest pain or shortness of breath  Objective: Vital signs in last 24 hours: Temp:  [97.2 F (36.2 C)-99 F (37.2 C)] 99 F (37.2 C) (11/21 0622) Pulse Rate:  [79-95] 95 (11/21 0622) Resp:  [13-28] 16 (11/21 0622) BP: (102-158)/(43-67) 102/49 mmHg (11/21 0622) SpO2:  [95 %-100 %] 95 % (11/21 0622)  Intake/Output from previous day: 11/20 0701 - 11/21 0700 In: 1360 [P.O.:360; I.V.:1000] Out: 2000 [Urine:1800; Blood:200]  Labs:  Recent Labs  11/16/13 1048 11/18/13 1120  WBC 6.5  --   RBC 5.04  --   HCT 44.2 39.0  PLT 226  --     Recent Labs  11/16/13 1048 11/18/13 1120  NA 140 139  K 2.8* 3.8  CL 97  --   CO2 28  --   BUN 26*  --   CREATININE 1.39*  --   GLUCOSE 88 107*  CALCIUM 10.0  --    No results found for this basename: LABPT, INR,  in the last 72 hours  Physical Exam: Neurologically intact ABD soft Neurovascular intact Incision: dressing C/D/I and no drainage Compartment soft  Assessment/Plan: Patient stable  xrays n/a Continue mobilization with physical therapy Continue care  Advance diet Up with therapy D/C IV fluids Mobilization today Possible d/c Saturday or Sunday  Venita Lick, MD [facility] [phone]

## 2013-11-19 NOTE — Op Note (Signed)
NAMETAKIRA, SHERRIN NO.:  [id number]  MEDICAL RECORD NO.:  [id number]  LOCATION:  [id number]                        FACILITY:  [facility]  PHYSICIAN:  Alvy Beal, MD    DATE OF BIRTH:  1941/12/21  DATE OF PROCEDURE:  11/18/2013 DATE OF DISCHARGE:                              OPERATIVE REPORT   PREOPERATIVE DIAGNOSIS:  Lumbar spinal stenosis.  POSTOPERATIVE DIAGNOSE:  Lumbar spinal stenosis.  OPERATIVE PROCEDURE:  Lumbar laminectomy, decompression at L3-L5.  COMPLICATIONS:  None.  CONDITION:  Stable.  HISTORY:  This is a very pleasant elderly woman who has been complaining of severe back, buttock, and bilateral leg pain.  The patient's clinical exam consistent with spinal stenosis with neurogenic claudication. After failing various forms of conservative management, she elected to proceed with surgery.  All appropriate risks, benefits, and alternatives were discussed and consent was obtained.  OPERATIVE NOTE:  The patient was brought to the operating room, placed supine on the operating table.  After successful induction of general anesthesia and endotracheal intubation, TEDs, SCDs and Foley were placed.  The patient was turned prone onto the Wilson frame.  Care was taken while positioning due to underlying rheumatoid arthritis and stiff joints.  All bony prominences were well-padded.  The back was prepped and draped in a standard fashion.  A time-out was taken to confirm the patient, procedure, and all other pertinent important data.  Two needles were placed into the back to mark out the incision and an x- ray was taken.  The L3-L5 incision site was infiltrated with 0.25% Marcaine with epi.  Midline incision was made extending from the midportion of the L3 to the posterior portion of the L5 spinous process. Sharp dissection was carried out down to the deep fascia.  The deep fascia was sharply incised, and I stripped the paraspinal muscles to expose the L5,  L4 and L3 spinous processes and lamina.  Care was taken not to violate the facet capsules.  I then placed self-retaining retractor.  At this point, I could visualize the entire posterior elements.  I then placed a Penfield 4 underneath the lamina of L4 and took an x-ray to confirm that I was at the appropriate level.  Once I confirmed the L4 lamina, I elected to proceed with decompression. Because of the severe stenosis at L4-5 in order to adequately __________ I decided to perform a complete laminectomy of L5 and L4 to get an adequate decompression.  I used a double-action Leksell rongeur to remove the spinous processes of L5, L4 and a portion of that of L3.  I then debulked the lamina of L5, L4 and a portion of L3 also.  I then used curved 3, 4 angled nerve curette to develop a plane underneath the remaining portion of the lamina and ligamentum flavum.  Using a 3 and 2 mm Kerrison, I performed a complete laminectomy of L5.  There was significant bruising to the thecal sac noted.  There was a white plaque area consistent with the bruising.  Once I had the central decompression completed at L5, I proceeded up and performed an L4 laminectomy.  I then did an L3 partial laminotomy centrally.  At this  point, I had the central decompression complete.  I essentially decompressed the L3 pedicle down to the L5 pedicle.  I then went into the lateral gutter, removing the very thickened buckled ligamentum flavum and osteophyte.  I took my dissection out so I could palpate and visualize the medial border of the L3, L4 and L5 pedicle.  Once I could visualize the pedicle, I then proceeded into the lateral recess removing any overhanging osteophyte.  I then did foraminotomies of L3, L4 and L5 using a 2-mm Kerrison.  I then used my [facility] to palpate it superiorly and then palpated along the lateral recess and out each of the neural foramen.  I had adequate decompression.  I then  obtained hemostasis using bipolar electrocautery.  I then went to the contralateral side of the table and worked on the other side.  Working across the table allowed for better visualization and decompression. Using the same technique that I utilized on the contralateral side, I performed a lateral recess decompression and foraminotomies.  At this point, with my [facility], I could palpate and visualize the medial borders of the pedicle and so I knew my decompression was adequate.  The foraminotomies were also adequate.  I then irrigated the wound copiously with normal saline, placed a thrombin-soaked Gelfoam patty over the exposed thecal sac.  I then because of her need for steroids and a steroid taper, I elected to place antibiotic beads into the wound directly.  Vancomycin-impregnated beads were placed and I closed the deep fascia with interrupted #1 Vicryl suture, superficial with 2-0 Vicryl sutures, and a 3-0 Monocryl for the skin.  Steri-Strips and dry dressing were applied.  The patient was extubated, transferred to PACU without incident.  At the end of the case, all needle and sponge counts were correct.  First assistant was Zonia Kief, my PA.  There were no intraoperative complications.     Alvy Beal, MD     DDB/MEDQ  D:  11/18/2013  T:  11/19/2013  Job:  [id number]

## 2013-11-19 NOTE — Evaluation (Signed)
Occupational Therapy Evaluation Patient Details Name: Sonya Goodwin MRN: [id number] DOB: 06-27-1941 Today's Date: 11/19/2013 Time: 4098-1191 OT Time Calculation (min): 34 min  OT Assessment / Plan / Recommendation History of present illness Pt is 72 y/o female s/p decompression L3-5 on 11/18/13.   Clinical Impression   Pt presents w/ dx as above now impacting her ability to perform ADL independently. She should benefit from acute OT to provide education and assist in maximizing independence with ADL's prior to d/c home w/ family 24/PRN assist. Perform shower transfer and educate family on LB ADL's next visit.    OT Assessment  Patient needs continued OT Services    Follow Up Recommendations  Supervision/Assistance - 24 hour    Barriers to Discharge      Equipment Recommendations  Other (comment) (Pt has reacher, may need Long handled sponge, shoehorn, sockaid. Cont to assess)    Recommendations for Other Services    Frequency  Min 2X/week    Precautions / Restrictions Precautions Precautions: Fall;Back Precaution Booklet Issued: Yes (comment) Precaution Comments: Verbally reviewed w/ pt & husband & left in pt's room Required Braces or Orthoses: Spinal Brace Spinal Brace: Lumbar corset;Applied in sitting position Restrictions Weight Bearing Restrictions: No   Pertinent Vitals/Pain 7/10 low back pain. Pt smiling and talking throughout session. She reports that she was given pain medication prior to treatment session. Repositioned, rest at conclusion of OT/PT today.    ADL  Eating/Feeding: Performed;Independent Where Assessed - Eating/Feeding: Chair Grooming: Simulated;Supervision/safety Where Assessed - Grooming: Supported standing Upper Body Bathing: Simulated;Set up Where Assessed - Upper Body Bathing: Supported sitting Lower Body Bathing: Simulated;Minimal assistance Where Assessed - Lower Body Bathing: Supported sit to stand Upper Body Dressing: Simulated;Set  up;Minimal assistance Where Assessed - Upper Body Dressing: Unsupported sitting Lower Body Dressing: Performed;Maximal assistance;Minimal assistance Where Assessed - Lower Body Dressing: Supported sit to stand Toilet Transfer: Performed;Supervision/safety (Ambulating to bathroom) Toilet Transfer Method: Sit to stand Toilet Transfer Equipment: Raised toilet seat with arms (or 3-in-1 over toilet) Toileting - Clothing Manipulation and Hygiene: Performed;Supervision/safety Where Assessed - Toileting Clothing Manipulation and Hygiene: Sit to stand from 3-in-1 or toilet Tub/Shower Transfer Method: Not assessed Equipment Used: Back brace;Rolling walker;Gait belt Transfers/Ambulation Related to ADLs: Pt overall supervision level for functional mobility and toilet transfers. ADL Comments: Pt was educated in role of OT and back precautions. Handout was issued re: back precautions and left in pt's room. Pt was set up-min guard for donning back brace in sitting EOB. Pt was also educated in a/e for LB ADL's and states that she has a Sports administrator at home. Pt husband states that he will assist 24/PRN as well as their daughter and grand daughter whom "Is a CNA".     OT Diagnosis: Generalized weakness;Acute pain  OT Problem List: Decreased knowledge of precautions;Decreased knowledge of use of DME or AE;Decreased strength;Pain OT Treatment Interventions: Self-care/ADL training;DME and/or AE instruction;Patient/family education;Therapeutic activities   OT Goals(Current goals can be found in the care plan section) Acute Rehab OT Goals Patient Stated Goal: Decreased pain in back. Home as able. Time For Goal Achievement: 11/26/13 Potential to Achieve Goals: Good  Visit Information  Last OT Received On: 11/19/13 PT/OT Co-Evaluation/Treatment: Yes History of Present Illness: Pt is 72 y/o female s/p decompression L3-5 on 11/18/13.       Prior Functioning     Home Living Family/patient expects to be discharged  to:: Private residence Living Arrangements: Spouse/significant other Available Help at Discharge: Family Type of Home: House  Home Access: Ramped entrance Home Layout: One level Home Equipment: Shower seat - built in;Hand held Careers information officer - 2 wheels;Cane - single point;Bedside commode;Adaptive equipment Prior Function Level of Independence: Independent with assistive device(s) Comments: SPC for ambulation prior to surgery. Communication Communication: HOH;Deaf;Other (comment) (Deaf in R ear, wears hearing aids.) Dominant Hand: Right    Vision/Perception Vision - History Baseline Vision: Wears glasses only for reading Visual History: Glaucoma Patient Visual Report: No change from baseline   Cognition  Cognition Arousal/Alertness: Awake/alert Behavior During Therapy: WFL for tasks assessed/performed Overall Cognitive Status: Within Functional Limits for tasks assessed    Extremity/Trunk Assessment Upper Extremity Assessment Upper Extremity Assessment: Generalized weakness (Pt w/ h/o arthritis and generalized weakness) Lower Extremity Assessment Lower Extremity Assessment: Defer to PT evaluation Cervical / Trunk Assessment Cervical / Trunk Assessment: Normal    Mobility Bed Mobility Bed Mobility: Rolling Right;Right Sidelying to Sit Rolling Right: 5: Supervision Right Sidelying to Sit: 5: Supervision;HOB flat Details for Bed Mobility Assistance: VC's for safety, sequencing, hand placement. Pt required increased time for tasks and verbal encouragement. Transfers Transfers: Sit to Stand;Stand to Sit Sit to Stand: 5: Supervision;From bed;From toilet;With armrests Stand to Sit: 5: Supervision;With armrests;To chair/3-in-1;To toilet Details for Transfer Assistance: Pt requires consistant vc's during transfers to maintain back precautions, ie. bend knees and reach back to arm rests when sitting vs rotating UB to look for arm rests.     Exercise Other Exercises Other  Exercises: Discussed A/E and LB dressing techniques secondary to back precautions, hand out issued and reviewed.  Balance     End of Session OT - End of Session Equipment Utilized During Treatment: Gait belt;Rolling walker;Back brace Activity Tolerance: Patient tolerated treatment well Patient left: in chair;with call bell/phone within reach;with family/visitor present;Other (comment) (Lab technician to draw blood)  GO     Alm Bustard 11/19/2013, 12:24 PM

## 2013-11-19 NOTE — Evaluation (Signed)
Physical Therapy Evaluation Patient Details Name: Sonya Goodwin MRN: [id number] DOB: 1941/11/03 Today's Date: 11/19/2013 Time: 1100-1130 PT Time Calculation (min): 30 min  PT Assessment / Plan / Recommendation History of Present Illness  Pt is 72 y/o female s/p decompression L3-5 on 11/18/13.  Clinical Impression  Pt. Presents to PT with decrease in functional mobility and gait from back surgery and needs acute PT to address these issues in preparation for DC home with husband.      PT Assessment  Patient needs continued PT services    Follow Up Recommendations  No PT follow up;Other (comment) (unless she doesn't progress adequately)    Does the patient have the potential to tolerate intense rehabilitation      Barriers to Discharge        Equipment Recommendations  None recommended by PT    Recommendations for Other Services     Frequency Min 6X/week    Precautions / Restrictions Precautions Precautions: Fall;Back Precaution Booklet Issued: Yes (comment) Precaution Comments: reviewed precautions and provided written handout Required Braces or Orthoses: Spinal Brace Spinal Brace: Lumbar corset;Applied in sitting position Restrictions Weight Bearing Restrictions: No   Pertinent Vitals/Pain See vitals tab       Mobility  Bed Mobility Bed Mobility: Rolling Right;Right Sidelying to Sit Rolling Right: 5: Supervision Right Sidelying to Sit: 5: Supervision;HOB flat Details for Bed Mobility Assistance: Pt. needs increased time for tasks and cues for sequencing, safety and hand placement Transfers Transfers: Sit to Stand;Stand to Sit Sit to Stand: 5: Supervision;From bed;From toilet;With armrests Stand to Sit: 5: Supervision;With armrests;To chair/3-in-1;To toilet Details for Transfer Assistance: safety and precaution cues to maintain back precautions and left from LEs and not back Ambulation/Gait Ambulation/Gait Assistance: 4: Min guard Ambulation Distance  (Feet): 100 Feet Assistive device: Rolling walker Ambulation/Gait Assistance Details: cues for erect posture and safe technique with RW, min guard assist for safety Gait Pattern: Step-through pattern Gait velocity: decreased Stairs: No    Exercises Other Exercises Other Exercises: Discussed A/E and LB dressing techniques secondary to back precautions, hand out issued and reviewed.   PT Diagnosis: Difficulty walking;Acute pain  PT Problem List: Decreased activity tolerance;Decreased mobility;Decreased knowledge of use of DME;Decreased knowledge of precautions;Pain PT Treatment Interventions: DME instruction;Gait training;Functional mobility training;Patient/family education     PT Goals(Current goals can be found in the care plan section) Acute Rehab PT Goals Patient Stated Goal: Decreased pain in back. Home as able. PT Goal Formulation: With patient Time For Goal Achievement: 11/26/13 Potential to Achieve Goals: Good  Visit Information  Last PT Received On: 11/19/13 Assistance Needed: +1 PT/OT Co-Evaluation/Treatment: Yes History of Present Illness: Pt is 72 y/o female s/p decompression L3-5 on 11/18/13.       Prior Functioning  Home Living Family/patient expects to be discharged to:: Private residence Living Arrangements: Spouse/significant other Available Help at Discharge: Family Type of Home: House Home Access: Ramped entrance Home Layout: One level Home Equipment: Shower seat - built in;Hand held Careers information officer - 2 wheels;Cane - single point;Bedside commode;Adaptive equipment Prior Function Level of Independence: Independent with assistive device(s) Comments: single point cane for ambulation PTA Communication Communication: HOH;Deaf;Other (comment) (deaf in R ear, wears hearing aids) Dominant Hand: Right    Cognition  Cognition Arousal/Alertness: Awake/alert Behavior During Therapy: WFL for tasks assessed/performed Overall Cognitive Status: Within Functional  Limits for tasks assessed    Extremity/Trunk Assessment Upper Extremity Assessment Upper Extremity Assessment: Defer to OT evaluation Lower Extremity Assessment Lower Extremity Assessment: Overall [facility]  for tasks assessed Cervical / Trunk Assessment Cervical / Trunk Assessment: Normal   Balance    End of Session PT - End of Session Equipment Utilized During Treatment: Gait belt;Back brace Activity Tolerance: Patient tolerated treatment well Patient left: in chair;with call bell/phone within reach;with family/visitor present Nurse Communication: Mobility status;Precautions  GP     Ferman Hamming 11/19/2013, 1:45 PM Weldon Picking PT Acute Rehab Services [phone] [facility] [phone]

## 2013-11-19 NOTE — Care Management Utilization Note (Signed)
Utilization review completed. Thatcher Doberstein, RN BSN 

## 2013-11-19 NOTE — Progress Notes (Signed)
Patient ID: Sonya Goodwin, female   DOB: 1941-10-04, 72 y.o.   MRN: [id number]   I asked [facility] if they would follow patient to help manage multiple medical issues.

## 2013-11-19 NOTE — Consult Note (Addendum)
[facility] Consult Note                                                                                    Patient Demographics  Sonya Goodwin, is a 72 y.o. female   MRN: [id number]   DOB - June 06, 1941  Admit Date - 11/18/2013    Outpatient Primary MD for the patient is No primary provider on file.  Consult requested in the Hospital by Venita Lick, MD, On 11/19/2013    Reason for consult - diabetes mellitus   With History of -  Past Medical History  Diagnosis Date  . PONV (postoperative nausea and vomiting)   . Sarcoidosis   . Hx: UTI (urinary tract infection)   . History of kidney stones   . Anemia   . Cancer     skin cancer, "spot on pancreas"  . Diabetes mellitus without complication     Type II  . Deafness in right ear     wears hearing aids  . Arthritis   . GERD (gastroesophageal reflux disease)     takes Protonix  . Dysphagia   . Glaucoma   . Shortness of breath   . Sleep apnea     mild  . Hypertension   . High cholesterol   . CKD (chronic kidney disease)     Stage III as of 09/2013  . Adrenal insufficiency       Past Surgical History  Procedure Laterality Date  . Esophagogastroduodenoscopy (egd) with esophageal dilation    . Colonoscopy    . Cataract surgery      both eyes    in for   No chief complaint on file.    HPI  [name]  is a 72 y.o. female, history of sarcoidosis, diabetes mellitus type 2, mild obstructive sleep apnea does not use CPAP, hypertension, dyslipidemia, adrenal insufficiency on 2 mg of prednisone daily, chronic kidney disease stage III baseline creatinine around 1.4 who was admitted by neurosurgery service for elective L-spine surgery which she underwent yesterday. We were consulted today to manage her diabetes mellitus. Patient is completely symptom free and in good spirits postop and has no subjective complaints whatsoever.    Review of Systems    In addition to the HPI above,   No Fever-chills, No  Headache, No changes with Vision or hearing, No problems swallowing food or Liquids, No Chest pain, Cough or Shortness of Breath, No Abdominal pain, No Nausea or Vommitting, Bowel movements are regular, No Blood in stool or Urine, No dysuria, No new skin rashes or bruises, No new joints pains-aches,  No new weakness, tingling, numbness in any extremity, No recent weight gain or loss, No polyuria, polydypsia or polyphagia, No significant Mental Stressors.  A full 10 point Review of Systems was done, except as stated above, all other Review of Systems were negative.   Social History History  Substance Use Topics  . Smoking status: Never Smoker   . Smokeless tobacco: Never Used  . Alcohol Use: No      Family History CAD in her brother in his mid 89s  Prior to Admission medications   Medication Sig Start Date  End Date Taking? Authorizing Provider  amitriptyline (ELAVIL) 25 MG tablet Take 25 mg by mouth at bedtime.   Yes Historical Provider, MD  aspirin EC 81 MG tablet Take 81 mg by mouth daily.   Yes Historical Provider, MD  brinzolamide (AZOPT) 1 % ophthalmic suspension Place 1 drop into both eyes 2 (two) times daily.   Yes Historical Provider, MD  gabapentin (NEURONTIN) 100 MG capsule Take 100-600 mg by mouth 3 (three) times daily. Take 1 capsule in the morning, 1 capsule in the evening, and 6 capsules at bedtime   Yes Historical Provider, MD  meclizine (ANTIVERT) 25 MG tablet Take 25 mg by mouth 3 (three) times daily as needed for dizziness or nausea.    Yes Historical Provider, MD  methylPREDNISolone (MEDROL) 2 MG tablet Take 2 mg by mouth daily.   Yes Historical Provider, MD  pantoprazole (PROTONIX) 40 MG tablet Take 40 mg by mouth daily.   Yes Historical Provider, MD  pioglitazone (ACTOS) 15 MG tablet Take 15 mg by mouth daily.   Yes Historical Provider, MD  triamterene-hydrochlorothiazide (MAXZIDE) 75-50 MG per tablet Take 1 tablet by mouth daily.   Yes Historical Provider,  MD  traMADol (ULTRAM) 50 MG tablet Take 1 tablet (50 mg total) by mouth every 6 (six) hours as needed for moderate pain. 11/19/13   Zonia Kief, PA-C    Anti-infectives   Start     Dose/Rate Route Frequency Ordered Stop   11/18/13 2000  ceFAZolin (ANCEF) IVPB 1 g/50 mL premix     1 g 100 mL/hr over 30 Minutes Intravenous Every 8 hours 11/18/13 1828 11/19/13 0430   11/18/13 1830  vancomycin (VANCOCIN) IVPB 1000 mg/200 mL premix  Status:  Discontinued     1,000 mg 200 mL/hr over 60 Minutes Intravenous Every 12 hours 11/18/13 1828 11/18/13 1831   11/18/13 1454  vancomycin (VANCOCIN) powder  Status:  Discontinued       As needed 11/18/13 1454 11/18/13 1613   11/18/13 1255  ceFAZolin (ANCEF) 2-3 GM-% IVPB SOLR    Comments:  Scronce, Trina   : cabinet override      11/18/13 1255 11/19/13 0059   11/18/13 1242  ceFAZolin (ANCEF) IVPB 2 g/50 mL premix     2 g 100 mL/hr over 30 Minutes Intravenous 30 min pre-op 11/18/13 1248 11/18/13 1330      Scheduled Meds: . acetaminophen  1,000 mg Intravenous Q6H  . amitriptyline  25 mg Oral QHS  . brinzolamide  1 drop Both Eyes BID  . docusate sodium  100 mg Oral BID  . gabapentin  100 mg Oral BID  . gabapentin  600 mg Oral QHS  . insulin aspart  0-15 Units Subcutaneous TID WC  . pioglitazone  15 mg Oral Q breakfast  . predniSONE  30 mg Oral Q breakfast  . sodium chloride  3 mL Intravenous Q12H  . triamterene-hydrochlorothiazide  1 tablet Oral Daily   Continuous Infusions: . sodium chloride 10 mL/hr at 11/18/13 1108  . sodium chloride     PRN Meds:.methocarbamol (ROBAXIN) IV, methocarbamol, ondansetron (ZOFRAN) IV, sodium chloride, traMADol, zolpidem  Allergies  Allergen Reactions  . Codeine Nausea And Vomiting    Pt reports that all opiates "make me violently ill".  . Other Itching and Rash    Food-cranberries  . Pain Patch [Menthol] Nausea And Vomiting    Not sure of brand  . Statins Other (See Comments)    Muscle wasting     Physical Exam  Vitals  Blood pressure 102/60, pulse 95, temperature 99 F (37.2 C), temperature source Oral, resp. rate 16, SpO2 95.00%.   1. General middle-aged Caucasian female lying in bed in NAD,     2. Normal affect and insight, Not Suicidal or Homicidal, Awake Alert, Oriented X 3.  3. No F.N deficits, ALL C.Nerves Intact, Strength 5/5 all 4 extremities, Sensation intact all 4 extremities, Plantars down going.  4. Ears and Eyes appear Normal, Conjunctivae clear, PERRLA. Moist Oral Mucosa.  5. Supple Neck, No JVD, No cervical lymphadenopathy appriciated, No Carotid Bruits.  6. Symmetrical Chest wall movement, Good air movement bilaterally, CTAB.  7. RRR, No Gallops, Rubs or Murmurs, No Parasternal Heave.  8. Positive Bowel Sounds, Abdomen Soft, Non tender, No organomegaly appriciated,No rebound -guarding or rigidity.  9.  No Cyanosis, Normal Skin Turgor, No Skin Rash or Bruise.  10. Good muscle tone,  joints appear normal , no effusions, Normal ROM. Wearing lumbar support belt  11. No Palpable Lymph Nodes in Neck or Axillae     Data Review  CBC  Recent Labs Lab 11/16/13 1048 11/18/13 1120 11/19/13 1128  WBC 6.5  --  7.0  HGB 14.6 13.3 11.6*  HCT 44.2 39.0 35.3*  PLT 226  --  197  MCV 87.7  --  88.3  MCH 29.0  --  29.0  MCHC 33.0  --  32.9  RDW 14.9  --  15.2   ------------------------------------------------------------------------------------------------------------------  Chemistries   Recent Labs Lab 11/16/13 1048 11/18/13 1120  NA 140 139  K 2.8* 3.8  CL 97  --   CO2 28  --   GLUCOSE 88 107*  BUN 26*  --   CREATININE 1.39*  --   CALCIUM 10.0  --    ------------------------------------------------------------------------------------------------------------------ CrCl is unknown because there is no height on file for the current  visit. ------------------------------------------------------------------------------------------------------------------ No results found for this basename: TSH, T4TOTAL, FREET3, T3FREE, THYROIDAB,  in the last 72 hours   Coagulation profile No results found for this basename: INR, PROTIME,  in the last 168 hours ------------------------------------------------------------------------------------------------------------------- No results found for this basename: DDIMER,  in the last 72 hours -------------------------------------------------------------------------------------------------------------------  Cardiac Enzymes No results found for this basename: CK, CKMB, TROPONINI, MYOGLOBIN,  in the last 168 hours ------------------------------------------------------------------------------------------------------------------ No components found with this basename: POCBNP,    ---------------------------------------------------------------------------------------------------------------  Urinalysis No results found for this basename: colorurine, appearanceur, labspec, phurine, glucoseu, hgbur, bilirubinur, ketonesur, proteinur, urobilinogen, nitrite, leukocytesur     Imaging results:   Dg Chest 2 View  11/16/2013   CLINICAL DATA:  Hypertension, sarcoidosis.  EXAM: CHEST  2 VIEW  COMPARISON:  May 12, 2009.  FINDINGS: Stable cardiomediastinal silhouette. Right peritracheal prominence is noted which is unchanged compared to prior exam. Stable interstitial densities are noted throughout both lungs most consistent with scarring. No pneumothorax or pleural effusion is noted. No acute pulmonary disease is noted. Bony thorax is intact  IMPRESSION: Stable chronic findings as described above. No acute abnormality seen.   Electronically Signed   By: Roque Lias M.D.   On: 11/16/2013 14:28   Dg Lumbar Spine 1 View  11/18/2013   CLINICAL DATA:  L3 -L5 decompression.  EXAM: LUMBAR SPINE - 1 VIEW   COMPARISON:  09/27/2010  FINDINGS: Examination demonstrates a metallic instrument over the posterior elements at the L4 level and over the S1 level. There is mild spondylosis present. There is disc space narrowing from the L2-3 level through the L4-5 level worse at the L4-5  level. No compression fracture or subluxation.  IMPRESSION: Mild spondylosis with multilevel degenerative disc disease worse at the L4-5 level.   Electronically Signed   By: Elberta Fortis M.D.   On: 11/18/2013 19:07   Dg Spine Portable 1 View  11/18/2013   CLINICAL DATA:  L3-4 decompression.  EXAM: PORTABLE SPINE - 1 VIEW  COMPARISON:  11/18/2013  FINDINGS: Single lateral view of the lumbar spine was obtained.  Lowest disc space is L5-S1.  Surgical instrument is overlying the canal at the L4-5 level. There is moderate disc degeneration and spurring at L4-5.  IMPRESSION: L4-5 disc space is localized.  I called the results to the operating room.   Electronically Signed   By: Marlan Palau M.D.   On: 11/18/2013 14:24        Assessment & Plan  1. Diabetes mellitus type 2. Control right now appears to be good, continue her home dose oral Actos along with low-dose sliding scale. We will monitor sugars and adjust as needed.   CBG (last 3)   Recent Labs  11/18/13 2150 11/19/13 0643 11/19/13 1132  GLUCAP 123* 90 101*     No results found for this basename: HGBA1C     2. Hypertension. Currently stable and not requiring any medications. We'll add hydralazine as needed IV.     3. History of sarcoidosis stable outpatient followup with PCP primary rheumatologist as needed.     4. Kidney disease stage III. Creatinine at baseline of around 1.4. Repeat BMP in the morning.     5. H/O Adrenal insufficiency. Baseline prednisone dose is 2 mg daily, she is currently on 30 mg of prednisone daily, we'll taper gradually to bring her down to 2 mg of her maintenance dose.     DVT Prophylaxis per Primary Team  AM Labs  Ordered, also please review Full Orders  Family Communication: Plan discussed with patient and husband   Thank you for the consult, we will follow the patient with you in the Hospital.   Susa Raring K M.D on 11/19/2013 at 12:12 PM  Between 7am to 7pm - Pager - [phone]  After 7pm go to [email or web address] - password [email or web address]  And look for the night coverage person covering me after hours   Thank you for the consult, we will follow the patient with you in the [facility].   [facility] Office  [phone]

## 2013-11-19 NOTE — Progress Notes (Signed)
11/19/13 PT and OT did not recommend follow up or any equipment. Jacquelynn Cree RN, BSN CCM

## 2013-11-20 LAB — GLUCOSE, CAPILLARY
Glucose-Capillary: 83 mg/dL (ref 70–99)
Glucose-Capillary: 115 mg/dL — ABNORMAL HIGH (ref 70–99)
Glucose-Capillary: 142 mg/dL — ABNORMAL HIGH (ref 70–99)

## 2013-11-20 NOTE — Progress Notes (Signed)
Physical Therapy Treatment Patient Details Name: FEMALE IAFRATE MRN: [id number] DOB: 28-Mar-1941 Today's Date: 11/20/2013 Time: 4098-1191 PT Time Calculation (min): 24 min  PT Assessment / Plan / Recommendation  History of Present Illness Pt is 72 y/o female s/p decompression L3-5 on 11/18/13.   PT Comments   Pt making steady progress with mobility with increased activity today and less overall assistance/cues needed.   Follow Up Recommendations  No PT follow up     Equipment Recommendations  None recommended by PT    Frequency Min 6X/week   Progress towards PT Goals Progress towards PT goals: Progressing toward goals  Plan Current plan remains appropriate    Precautions / Restrictions Precautions Precautions: Fall;Back Precaution Comments: reviewed precautions  Required Braces or Orthoses: Spinal Brace Spinal Brace: Lumbar corset;Applied in sitting position (needs min assist with brace)    Mobility  Bed Mobility Rolling Right: 5: Supervision Right Sidelying to Sit: 4: Min guard;HOB flat;5: Supervision Sitting - Scoot to Edge of Bed: 5: Supervision Sit to Sidelying Right: 5: Supervision;HOB flat Details for Bed Mobility Assistance: Cues for sequencing Transfers Sit to Stand: 6: Modified independent (Device/Increase time);From bed;With upper extremity assist;From toilet Stand to Sit: 6: Modified independent (Device/Increase time);To bed;To toilet;With upper extremity assist Ambulation/Gait Ambulation/Gait Assistance: 4: Min guard;5: Supervision Ambulation Distance (Feet): 300 Feet Assistive device: Rolling walker Ambulation/Gait Assistance Details: occasional cues for walker position with gait Gait Pattern: Step-through pattern;Decreased stride length Gait velocity: decreased      PT Goals (current goals can now be found in the care plan section) Acute Rehab PT Goals Patient Stated Goal: Decreased pain in back. Home as able. PT Goal Formulation: With  patient Time For Goal Achievement: 11/26/13 Potential to Achieve Goals: Good  Visit Information  Last PT Received On: 11/20/13 Assistance Needed: +1 History of Present Illness: Pt is 72 y/o female s/p decompression L3-5 on 11/18/13.    Subjective Data  Patient Stated Goal: Decreased pain in back. Home as able.   Cognition  Cognition Arousal/Alertness: Awake/alert Behavior During Therapy: WFL for tasks assessed/performed Overall Cognitive Status: Within Functional Limits for tasks assessed       End of Session PT - End of Session Equipment Utilized During Treatment: Gait belt;Back brace Activity Tolerance: Patient tolerated treatment well Patient left: in bed;with call bell/phone within reach;with family/visitor present Nurse Communication: Mobility status;Precautions   GP     Sallyanne Kuster 11/20/2013, 4:35 PM  Sallyanne Kuster, PTA Office- [phone]

## 2013-11-20 NOTE — Consult Note (Signed)
[facility] PROGRESS NOTE  Sonya Goodwin GEX:[id number] DOB: 1941-05-07 DOA: 11/18/2013 PCP: No primary provider on file.  Assessment/Plan: 72 y.o. female, history of sarcoidosis, diabetes mellitus type 2, mild obstructive sleep apnea does not use CPAP, hypertension, dyslipidemia, adrenal insufficiency on 2 mg of prednisone daily, chronic kidney disease stage III baseline creatinine around 1.4 who was admitted by neurosurgery service for elective L-spine surgery which she underwent yesterday. We were consulted today to manage her diabetes mellitus. Patient is completely symptom free and in good spirits postop and has no subjective complaints whatsoever.  1. DM cont current regimen; [pend HA1C; cont ISS while inpatient  -cont actos upon discharge; taper steroids   2. HTN stable; cont home regimen   3. History of sarcoidosis stable outpatient followup with PCP primary rheumatologist as needed.  4. Kidney disease stage III. Creatinine at baseline of around 1.4; cont outpatient follow up   5. H/O Adrenal insufficiency. Baseline prednisone dose is 2 mg daily, taper gradually to bring her down to 2 mg of her maintenance dose.  Thank you for the consultation  Will sign off for now; please call with question;   Code Status: full Family Communication: none at the bedside (indicate person spoken with, relationship, and if by phone, the number) Disposition Plan: per primary    Consultants:    Procedures:  L spine decompression on 11/19/13  Antibiotics:  None  (indicate start date, and stop date if known)  HPI/Subjective: alert  Objective: Filed Vitals:   11/19/13 2029  BP: 141/67  Pulse: 80  Temp: 97.7 F (36.5 C)  Resp:     Intake/Output Summary (Last 24 hours) at 11/20/13 0913 Last data filed at 11/19/13 2029  Gross per 24 hour  Intake    120 ml  Output      0 ml  Net    120 ml   There were no vitals filed for this visit.  Exam:   General:   alert  Cardiovascular: s1,s2 rrr  Respiratory: CTA BL  Abdomen: soft, nt, nd   Musculoskeletal: no edema   Data Reviewed: Basic Metabolic Panel:  Recent Labs Lab 11/16/13 1048 11/18/13 1120 11/19/13 1128  NA 140 139 140  K 2.8* 3.8 3.7  CL 97  --  100  CO2 28  --  29  GLUCOSE 88 107* 91  BUN 26*  --  23  CREATININE 1.39*  --  1.30*  CALCIUM 10.0  --  9.9   Liver Function Tests: No results found for this basename: AST, ALT, ALKPHOS, BILITOT, PROT, ALBUMIN,  in the last 168 hours No results found for this basename: LIPASE, AMYLASE,  in the last 168 hours No results found for this basename: AMMONIA,  in the last 168 hours CBC:  Recent Labs Lab 11/16/13 1048 11/18/13 1120 11/19/13 1128  WBC 6.5  --  7.0  HGB 14.6 13.3 11.6*  HCT 44.2 39.0 35.3*  MCV 87.7  --  88.3  PLT 226  --  197   Cardiac Enzymes: No results found for this basename: CKTOTAL, CKMB, CKMBINDEX, TROPONINI,  in the last 168 hours BNP (last 3 results) No results found for this basename: PROBNP,  in the last 8760 hours CBG:  Recent Labs Lab 11/19/13 0643 11/19/13 1132 11/19/13 1617 11/19/13 2123 11/20/13 0645  GLUCAP 90 101* 154* 115* 83    Recent Results (from the past 240 hour(s))  SURGICAL PCR SCREEN     Status: Abnormal   Collection Time  11/16/13 10:48 AM      Result Value Range Status   MRSA, PCR NEGATIVE  NEGATIVE Final   Staphylococcus aureus POSITIVE (*) NEGATIVE Final   Comment:            The Xpert SA Assay (FDA     approved for NASAL specimens     in patients over 59 years of age),     is one component of     a comprehensive surveillance     program.  Test performance has     been validated by [employer] for patients greater     than or equal to 57 year old.     It is not intended     to diagnose infection nor to     guide or monitor treatment.     Studies: Dg Lumbar Spine 1 View  12-Dec-2013   CLINICAL DATA:  L3 -L5 decompression.  EXAM: LUMBAR SPINE - 1  VIEW  COMPARISON:  09/27/2010  FINDINGS: Examination demonstrates a metallic instrument over the posterior elements at the L4 level and over the S1 level. There is mild spondylosis present. There is disc space narrowing from the L2-3 level through the L4-5 level worse at the L4-5 level. No compression fracture or subluxation.  IMPRESSION: Mild spondylosis with multilevel degenerative disc disease worse at the L4-5 level.   Electronically Signed   By: Elberta Fortis M.D.   On: December 12, 2013 19:07   Dg Spine Portable 1 View  2013-12-12   CLINICAL DATA:  L3-4 decompression.  EXAM: PORTABLE SPINE - 1 VIEW  COMPARISON:  2013-12-12  FINDINGS: Single lateral view of the lumbar spine was obtained.  Lowest disc space is L5-S1.  Surgical instrument is overlying the canal at the L4-5 level. There is moderate disc degeneration and spurring at L4-5.  IMPRESSION: L4-5 disc space is localized.  I called the results to the operating room.   Electronically Signed   By: Marlan Palau M.D.   On: 2013/12/12 14:24    Scheduled Meds: . amitriptyline  25 mg Oral QHS  . brinzolamide  1 drop Both Eyes BID  . docusate sodium  100 mg Oral BID  . gabapentin  100 mg Oral BID  . gabapentin  600 mg Oral QHS  . insulin aspart  0-15 Units Subcutaneous TID WC  . pioglitazone  15 mg Oral Q breakfast  . [START ON 11/21/2013] predniSONE  10 mg Oral Once  . sodium chloride  3 mL Intravenous Q12H  . triamterene-hydrochlorothiazide  1 tablet Oral Daily   Continuous Infusions: . sodium chloride 10 mL/hr at December 12, 2013 1108  . sodium chloride      Active Problems:   Sarcoidosis   Adrenal insufficiency   CKD (chronic kidney disease)   Hypertension   High cholesterol   Diabetes mellitus without complication    Time spent: >35 minutes    Esperanza Sheets  [facility] Pager [phone]. If 7PM-7AM, please contact night-coverage at [email or web address], password [facility] 11/20/2013, 9:13 AM  LOS: 2 days

## 2013-11-20 NOTE — Progress Notes (Signed)
Subjective: 2 Days Post-Op Procedure(s) (LRB): DECOMPRESSION L3 - L5  2 LEVELS (N/A) Patient reports pain as 1 on 0-10 scale. Up in chair and only compaint is ,"My urine Smells". Will get Urine C&S Objective: Vital signs in last 24 hours: Temp:  [97.7 F (36.5 C)-98.1 F (36.7 C)] 97.7 F (36.5 C) (11/21 2029) Pulse Rate:  [80-86] 80 (11/21 2029) Resp:  [18] 18 (11/21 1336) BP: (102-141)/(51-67) 141/67 mmHg (11/21 2029) SpO2:  [96 %-100 %] 100 % (11/21 2029)  Intake/Output from previous day: 11/21 0701 - 11/22 0700 In: 120 [P.O.:120] Out: -  Intake/Output this shift:     Recent Labs  11/18/13 1120 11/19/13 1128  HGB 13.3 11.6*    Recent Labs  11/18/13 1120 11/19/13 1128  WBC  --  7.0  RBC  --  4.00  HCT 39.0 35.3*  PLT  --  197    Recent Labs  11/18/13 1120 11/19/13 1128  NA 139 140  K 3.8 3.7  CL  --  100  CO2  --  29  BUN  --  23  CREATININE  --  1.30*  GLUCOSE 107* 91  CALCIUM  --  9.9   No results found for this basename: LABPT, INR,  in the last 72 hours  Dorsiflexion/Plantar flexion intact  Assessment/Plan: 2 Days Post-Op Procedure(s) (LRB): DECOMPRESSION L3 - L5  2 LEVELS (N/A) Up with therapy  Airel Magadan A 11/20/2013, 8:15 AM

## 2013-11-20 NOTE — Progress Notes (Signed)
Occupational Therapy Treatment and Discharge Patient Details Name: Sonya Goodwin MRN: [id number] DOB: Nov 21, 1941 Today's Date: 11/20/2013 Time: 4098-1191 OT Time Calculation (min): 29 min  OT Assessment / Plan / Recommendation  History of present illness Pt is 72 y/o female s/p decompression L3-5 on 11/18/13.   OT comments  This 72 yo female admitted with above presents to acute OT with all education completed and 24 hour S/prn A at home once D/C'd, no further OT needs, we will sign off.  Follow Up Recommendations  Supervision/Assistance - 24 hour       Equipment Recommendations   (Pt has reacher; does not need other AE)       Frequency Min 2X/week   Progress towards OT Goals Progress towards OT goals: Progressing toward goals  Plan Discharge plan remains appropriate    Precautions / Restrictions Precautions Precautions: Fall;Back Precaution Comments: reviewed precautions  Required Braces or Orthoses: Spinal Brace Spinal Brace: Lumbar corset;Applied in sitting position (too large, family to check with [facility] on Monday) Restrictions Weight Bearing Restrictions: No       ADL  Lower Body Dressing: Minimal assistance (to don socks; could doff and put pants on crossing legs sitting EOB with brace on) Where Assessed - Lower Body Dressing: Unsupported standing Tub/Shower Transfer: Minimal assistance (to stedy RW for sit to stand; pt could not do transfer with hands on thighs to due bad knees) Tub/Shower Transfer Method: Science writer: Walk in shower;Shower seat without back Equipment Used: Back brace;Rolling walker Transfers/Ambulation Related to ADLs: S for all with RW (except min A for shower stall transfers) ADL Comments: Pt has a reacher at home that she normall uses for LBD; pt donned brace with only verbal cues (it really is too big, family to follow up on this Monday with [facility])     OT Goals(current goals  can now be found in the care plan section)    Visit Information  Last OT Received On: 11/20/13 Assistance Needed: +1 History of Present Illness: Pt is 72 y/o female s/p decompression L3-5 on 11/18/13.          Cognition  Cognition Arousal/Alertness: Awake/alert Behavior During Therapy: WFL for tasks assessed/performed Overall Cognitive Status: Within Functional Limits for tasks assessed    Mobility  Bed Mobility Bed Mobility: Rolling Right;Right Sidelying to Sit;Sitting - Scoot to [place] of Bed;Sit to Sidelying Right Rolling Right: 5: Supervision Right Sidelying to Sit: 5: Supervision;HOB flat Sitting - Scoot to Edge of Bed: 5: Supervision Sit to Sidelying Right: 5: Supervision;HOB flat Details for Bed Mobility Assistance: Cues for sequencing Transfers Transfers: Sit to Stand;Stand to Sit Sit to Stand: 5: Supervision;With upper extremity assist;From bed Stand to Sit: 5: Supervision;With upper extremity assist;To bed Details for Transfer Assistance: Pt safely placed her hands          End of Session OT - End of Session Equipment Utilized During Treatment: Rolling walker;Back brace Activity Tolerance: Patient tolerated treatment well Patient left: in bed;with call bell/phone within reach;with family/visitor present       Evette Georges [phone] 11/20/2013, 12:10 PM

## 2013-11-21 LAB — URINE CULTURE

## 2013-11-21 LAB — GLUCOSE, CAPILLARY: Glucose-Capillary: 100 mg/dL — ABNORMAL HIGH (ref 70–99)

## 2013-11-21 MED ORDER — PREDNISONE 10 MG PO TABS
10.0000 mg | ORAL_TABLET | Freq: Once | ORAL | Status: AC
  Administered 2013-11-21: 10 mg via ORAL
  Filled 2013-11-21: qty 1

## 2013-11-21 MED ORDER — PREDNISONE 10 MG PO TABS: 10.0000 mg | ORAL_TABLET | Freq: Once | ORAL | Status: DC

## 2013-11-21 MED ORDER — METHYLPREDNISOLONE 2 MG PO TABS: 2.0000 mg | ORAL_TABLET | Freq: Every day | ORAL | Status: DC

## 2013-11-22 LAB — HEMOGLOBIN A1C
Hgb A1c MFr Bld: 6.1 % — ABNORMAL HIGH (ref <5.7)
Mean Plasma Glucose: 128 mg/dL — ABNORMAL HIGH (ref <117)

## 2013-11-23 NOTE — Progress Notes (Signed)
Patient and husband present for discharge instructions.  Prescriptions and explanation of medications provided verbally and with handouts.  Discharge instructions included the following:  Activity restrictions, incision care, and activity progression.   Medication list and changes in medication provided verbally and in writing.  Comprehension of instructions validated via "teach back" communication.  Patient discharged to private residence via private vehicle with husband.  Escorted to exit via wheelchair accompanied by nurse tech.

## 2013-12-16 NOTE — Discharge Summary (Signed)
Physician Discharge Summary  Patient ID: Sonya Goodwin MRN: [id number] DOB/AGE: Oct 16, 1941 72 y.o.  Admit date: 11/18/2013 Discharge date:  21 Nov 2013  Admission Diagnoses:  L3-5 spinal stenosis  Discharge Diagnoses:  Active Problems:   Sarcoidosis   Adrenal insufficiency   CKD (chronic kidney disease)   Hypertension   High cholesterol   Diabetes mellitus without complication   Discharged Condition: stable  Hospital Course: patient was taken to the OR 18 Nov 2013 for scheduled L3-5 decompression.  Tolerated surgery well and without complication.  Transferred to ortho unit.  21 nov, doing well.  Pain controlled.  Started PT.  22 nov, no issues.  Wound looked good.  No drainage or signs of infection.  Nvi.  23 nov, doing well and ready for d/c home.    Consults: None  Discharge Exam: Blood pressure 118/61, pulse 66, temperature 97.2 F (36.2 C), temperature source Oral, resp. rate 16, SpO2 95.00%. Wound looked good.  steris intact.  No drainage or signs of infection.    Disposition: 01-Home or Self Care  Discharge Orders   Future Appointments Provider Department Dept Phone   01/27/2014 11:15 AM [facility] [place] Office [phone]   Future Orders Complete By Expires   Call MD / Call 911  As directed    Comments:     If you experience chest pain or shortness of breath, CALL 911 and be transported to the hospital emergency room.  If you develope a fever above 101 F, pus (white drainage) or increased drainage or redness at the wound, or calf pain, call your surgeon's office.   Constipation Prevention  As directed    Comments:     Drink plenty of fluids.  Prune juice may be helpful.  You may use a stool softener, such as Colace (over the counter) 100 mg twice a day.  Use MiraLax (over the counter) for constipation as needed.   Diet - low sodium heart healthy  As directed    Discharge instructions  As directed    Comments:     Ok to shower 5 days  postop.  Do not apply any creams or ointments to incision.  Do not remove steri-strips.  Can use 4x4 gauze and tape for dressing changes.  No aggressive activity.  No bending, squatting or prolonged sitting.  Mostly be in reclined position or lying down.  Must wear brace.   Driving restrictions  As directed    Comments:     No driving until further notice.   Increase activity slowly as tolerated  As directed    Lifting restrictions  As directed    Comments:     No lifting until further notice.       Medication List    STOP taking these medications       fish oil-omega-3 fatty acids 1000 MG capsule      TAKE these medications       amitriptyline 25 MG tablet  Commonly known as:  ELAVIL  Take 25 mg by mouth at bedtime.     aspirin EC 81 MG tablet  Take 81 mg by mouth daily.     brinzolamide 1 % ophthalmic suspension  Commonly known as:  AZOPT  Place 1 drop into both eyes 2 (two) times daily.     gabapentin 100 MG capsule  Commonly known as:  NEURONTIN  Take 100-600 mg by mouth 3 (three) times daily. Take 1 capsule in the morning, 1 capsule in  the evening, and 6 capsules at bedtime     meclizine 25 MG tablet  Commonly known as:  ANTIVERT  Take 25 mg by mouth 3 (three) times daily as needed for dizziness or nausea.     methylPREDNISolone 2 MG tablet  Commonly known as:  MEDROL  Take 1 tablet (2 mg total) by mouth daily. Please resume home dosing starting Tuesday 11/23/2013     pantoprazole 40 MG tablet  Commonly known as:  PROTONIX  Take 40 mg by mouth daily.     pioglitazone 15 MG tablet  Commonly known as:  ACTOS  Take 15 mg by mouth daily.     predniSONE 10 MG tablet  Commonly known as:  DELTASONE  Take 1 tablet (10 mg total) by mouth once. Take one 10 mg tablet on Monday 11/22/2013, then resume home dosing of steroid on Tuesday 11/23/2013     traMADol 50 MG tablet  Commonly known as:  ULTRAM  Take 1 tablet (50 mg total) by mouth every 6 (six) hours as needed  for moderate pain.     triamterene-hydrochlorothiazide 75-50 MG per tablet  Commonly known as:  MAXZIDE  Take 1 tablet by mouth daily.           Follow-up Information   Call Alvy Beal, MD. (need return office visit 2 weeks postop)    Specialty:  Orthopedic Surgery   Contact information:   [address] [place] [address] [phone]       Signed: Naida Sleight 12/16/2013, 12:33 PM

## 2013-12-17 NOTE — Discharge Summary (Signed)
Agree with above 

## 2013-12-31 NOTE — Progress Notes (Signed)
PT EVALUATION ADDENDUM  LATE ENTRY G=CODES   Dec 08, 2013 1337  PT Time Calculation  PT Start Time 1100  PT Stop Time 1130  PT Time Calculation (min) 30 min  PT G-Codes **NOT FOR INPATIENT CLASS**  Functional Assessment Tool Used clinical judgement  Functional Limitation Mobility: Walking and moving around  Mobility: Walking and Moving Around Current Status (W4665) CJ  Mobility: Walking and Moving Around Goal Status (L9357) CI  PT General Charges  $$ ACUTE PT VISIT 1 Procedure  PT Evaluation  $Initial PT Evaluation Tier I 1 Procedure  PT Treatments  $Gait Training 8-22 mins  Gerlean Ren PT Acute Rehab Services [phone] Bee[place] [phone]

## 2013-12-31 NOTE — Progress Notes (Signed)
11/19/13 1224  OT Time Calculation  OT Start Time 1055  OT Stop Time 1129  OT Time Calculation (min) 34 min  OT G-codes **NOT FOR INPATIENT CLASS**  Functional Assessment Tool Used clinical judgement and chart review (clinical judgement)  Functional Limitation Self care  Self Care Current Status (V9480) CL  Self Care Goal Status (X6553) CJ  OT General Charges  $OT Visit 1 Procedure  OT Evaluation  $Initial OT Evaluation Tier I 1 Procedure  OT Treatments  $Self Care/Home Management  8-22 mins   Late entry for Amy Stefanie Libel, OTR/L [phone] 12/31/2013

## 2014-01-05 DIAGNOSIS — M48061 Spinal stenosis, lumbar region without neurogenic claudication: Secondary | ICD-10-CM | POA: Diagnosis not present

## 2014-01-07 DIAGNOSIS — M48061 Spinal stenosis, lumbar region without neurogenic claudication: Secondary | ICD-10-CM | POA: Diagnosis not present

## 2014-01-10 DIAGNOSIS — M48061 Spinal stenosis, lumbar region without neurogenic claudication: Secondary | ICD-10-CM | POA: Diagnosis not present

## 2014-01-12 DIAGNOSIS — M48061 Spinal stenosis, lumbar region without neurogenic claudication: Secondary | ICD-10-CM | POA: Diagnosis not present

## 2014-01-14 DIAGNOSIS — M48061 Spinal stenosis, lumbar region without neurogenic claudication: Secondary | ICD-10-CM | POA: Diagnosis not present

## 2014-01-17 DIAGNOSIS — M48061 Spinal stenosis, lumbar region without neurogenic claudication: Secondary | ICD-10-CM | POA: Diagnosis not present

## 2014-01-19 DIAGNOSIS — M48061 Spinal stenosis, lumbar region without neurogenic claudication: Secondary | ICD-10-CM | POA: Diagnosis not present

## 2014-01-21 DIAGNOSIS — M48061 Spinal stenosis, lumbar region without neurogenic claudication: Secondary | ICD-10-CM | POA: Diagnosis not present

## 2014-01-24 DIAGNOSIS — M48061 Spinal stenosis, lumbar region without neurogenic claudication: Secondary | ICD-10-CM | POA: Diagnosis not present

## 2014-01-26 DIAGNOSIS — M48061 Spinal stenosis, lumbar region without neurogenic claudication: Secondary | ICD-10-CM | POA: Diagnosis not present

## 2014-01-27 ENCOUNTER — Other Ambulatory Visit (INDEPENDENT_AMBULATORY_CARE_PROVIDER_SITE_OTHER): Payer: Medicare Other

## 2014-01-27 DIAGNOSIS — Z79899 Other long term (current) drug therapy: Secondary | ICD-10-CM | POA: Diagnosis not present

## 2014-01-27 DIAGNOSIS — E785 Hyperlipidemia, unspecified: Secondary | ICD-10-CM | POA: Diagnosis not present

## 2014-01-27 LAB — HEPATIC FUNCTION PANEL
ALK PHOS: 36 U/L — AB (ref 39–117)
ALT: 24 U/L (ref 0–35)
AST: 33 U/L (ref 0–37)
Albumin: 3.7 g/dL (ref 3.5–5.2)
BILIRUBIN DIRECT: 0 mg/dL (ref 0.0–0.3)
BILIRUBIN TOTAL: 0.7 mg/dL (ref 0.3–1.2)
TOTAL PROTEIN: 7 g/dL (ref 6.0–8.3)

## 2014-01-27 LAB — LDL CHOLESTEROL, DIRECT: Direct LDL: 169.6 mg/dL

## 2014-01-27 LAB — LIPID PANEL
Cholesterol: 262 mg/dL — ABNORMAL HIGH (ref 0–200)
HDL: 49.3 mg/dL (ref >39.00)
Total CHOL/HDL Ratio: 5
Triglycerides: 243 mg/dL — ABNORMAL HIGH (ref 0.0–149.0)
VLDL: 48.6 mg/dL — AB (ref 0.0–40.0)

## 2014-01-28 DIAGNOSIS — M48061 Spinal stenosis, lumbar region without neurogenic claudication: Secondary | ICD-10-CM | POA: Diagnosis not present

## 2014-01-31 DIAGNOSIS — M48061 Spinal stenosis, lumbar region without neurogenic claudication: Secondary | ICD-10-CM | POA: Diagnosis not present

## 2014-02-02 ENCOUNTER — Telehealth: Payer: Self-pay | Admitting: Internal Medicine

## 2014-02-02 ENCOUNTER — Other Ambulatory Visit: Payer: Self-pay | Admitting: General Surgery

## 2014-02-02 ENCOUNTER — Encounter: Payer: Self-pay | Admitting: General Surgery

## 2014-02-02 DIAGNOSIS — M48061 Spinal stenosis, lumbar region without neurogenic claudication: Secondary | ICD-10-CM | POA: Diagnosis not present

## 2014-02-02 DIAGNOSIS — E7800 Pure hypercholesterolemia, unspecified: Secondary | ICD-10-CM

## 2014-02-02 MED ORDER — CVS FISH OIL 1000 MG PO CAPS: 6000.0000 mg | ORAL_CAPSULE | Freq: Every day | ORAL | Status: DC

## 2014-02-02 NOTE — Telephone Encounter (Signed)
New problem    Pt stated she is taking 6 fish oil pills daily. Please call pt if any questions. Thought pt said Dr Lovena Le but she said Dr Theodosia Blender nurse.

## 2014-02-02 NOTE — Telephone Encounter (Signed)
Added to pts Med list

## 2014-02-03 DIAGNOSIS — E785 Hyperlipidemia, unspecified: Secondary | ICD-10-CM | POA: Diagnosis not present

## 2014-02-03 DIAGNOSIS — R300 Dysuria: Secondary | ICD-10-CM | POA: Diagnosis not present

## 2014-02-03 DIAGNOSIS — M949 Disorder of cartilage, unspecified: Secondary | ICD-10-CM | POA: Diagnosis not present

## 2014-02-03 DIAGNOSIS — E2749 Other adrenocortical insufficiency: Secondary | ICD-10-CM | POA: Diagnosis not present

## 2014-02-03 DIAGNOSIS — E1129 Type 2 diabetes mellitus with other diabetic kidney complication: Secondary | ICD-10-CM | POA: Diagnosis not present

## 2014-02-03 DIAGNOSIS — M899 Disorder of bone, unspecified: Secondary | ICD-10-CM | POA: Diagnosis not present

## 2014-02-03 DIAGNOSIS — G2589 Other specified extrapyramidal and movement disorders: Secondary | ICD-10-CM | POA: Diagnosis not present

## 2014-02-07 DIAGNOSIS — M48061 Spinal stenosis, lumbar region without neurogenic claudication: Secondary | ICD-10-CM | POA: Diagnosis not present

## 2014-02-09 DIAGNOSIS — M48061 Spinal stenosis, lumbar region without neurogenic claudication: Secondary | ICD-10-CM | POA: Diagnosis not present

## 2014-02-14 DIAGNOSIS — M48061 Spinal stenosis, lumbar region without neurogenic claudication: Secondary | ICD-10-CM | POA: Diagnosis not present

## 2014-02-28 DIAGNOSIS — R112 Nausea with vomiting, unspecified: Secondary | ICD-10-CM | POA: Diagnosis not present

## 2014-03-14 ENCOUNTER — Encounter: Payer: Self-pay | Admitting: Cardiology

## 2014-03-24 DIAGNOSIS — D485 Neoplasm of uncertain behavior of skin: Secondary | ICD-10-CM | POA: Diagnosis not present

## 2014-03-24 DIAGNOSIS — L570 Actinic keratosis: Secondary | ICD-10-CM | POA: Diagnosis not present

## 2014-03-24 DIAGNOSIS — L723 Sebaceous cyst: Secondary | ICD-10-CM | POA: Diagnosis not present

## 2014-03-24 DIAGNOSIS — D239 Other benign neoplasm of skin, unspecified: Secondary | ICD-10-CM | POA: Diagnosis not present

## 2014-03-24 DIAGNOSIS — L821 Other seborrheic keratosis: Secondary | ICD-10-CM | POA: Diagnosis not present

## 2014-03-24 DIAGNOSIS — Z85828 Personal history of other malignant neoplasm of skin: Secondary | ICD-10-CM | POA: Diagnosis not present

## 2014-03-25 DIAGNOSIS — D2330 Other benign neoplasm of skin of unspecified part of face: Secondary | ICD-10-CM | POA: Diagnosis not present

## 2014-04-19 DIAGNOSIS — H4011X0 Primary open-angle glaucoma, stage unspecified: Secondary | ICD-10-CM | POA: Diagnosis not present

## 2014-04-19 DIAGNOSIS — H4010X0 Unspecified open-angle glaucoma, stage unspecified: Secondary | ICD-10-CM | POA: Diagnosis not present

## 2014-04-19 DIAGNOSIS — H18419 Arcus senilis, unspecified eye: Secondary | ICD-10-CM | POA: Diagnosis not present

## 2014-04-19 DIAGNOSIS — Z961 Presence of intraocular lens: Secondary | ICD-10-CM | POA: Diagnosis not present

## 2014-04-20 DIAGNOSIS — H903 Sensorineural hearing loss, bilateral: Secondary | ICD-10-CM | POA: Diagnosis not present

## 2014-04-20 DIAGNOSIS — H905 Unspecified sensorineural hearing loss: Secondary | ICD-10-CM | POA: Diagnosis not present

## 2014-05-03 DIAGNOSIS — D136 Benign neoplasm of pancreas: Secondary | ICD-10-CM | POA: Diagnosis not present

## 2014-05-03 DIAGNOSIS — D279 Benign neoplasm of unspecified ovary: Secondary | ICD-10-CM | POA: Diagnosis not present

## 2014-05-03 DIAGNOSIS — K8689 Other specified diseases of pancreas: Secondary | ICD-10-CM | POA: Diagnosis not present

## 2014-05-03 DIAGNOSIS — N9489 Other specified conditions associated with female genital organs and menstrual cycle: Secondary | ICD-10-CM | POA: Diagnosis not present

## 2014-05-03 DIAGNOSIS — D869 Sarcoidosis, unspecified: Secondary | ICD-10-CM | POA: Diagnosis not present

## 2014-05-03 DIAGNOSIS — J99 Respiratory disorders in diseases classified elsewhere: Secondary | ICD-10-CM | POA: Diagnosis not present

## 2014-06-07 DIAGNOSIS — R1310 Dysphagia, unspecified: Secondary | ICD-10-CM | POA: Diagnosis not present

## 2014-06-07 DIAGNOSIS — J99 Respiratory disorders in diseases classified elsewhere: Secondary | ICD-10-CM | POA: Diagnosis not present

## 2014-06-07 DIAGNOSIS — R0602 Shortness of breath: Secondary | ICD-10-CM | POA: Diagnosis not present

## 2014-06-07 DIAGNOSIS — D869 Sarcoidosis, unspecified: Secondary | ICD-10-CM | POA: Diagnosis not present

## 2014-06-07 DIAGNOSIS — K219 Gastro-esophageal reflux disease without esophagitis: Secondary | ICD-10-CM | POA: Diagnosis not present

## 2014-06-09 DIAGNOSIS — R1310 Dysphagia, unspecified: Secondary | ICD-10-CM | POA: Diagnosis not present

## 2014-06-09 DIAGNOSIS — K219 Gastro-esophageal reflux disease without esophagitis: Secondary | ICD-10-CM | POA: Diagnosis not present

## 2014-06-09 DIAGNOSIS — K2270 Barrett's esophagus without dysplasia: Secondary | ICD-10-CM | POA: Diagnosis not present

## 2014-06-14 DIAGNOSIS — M48061 Spinal stenosis, lumbar region without neurogenic claudication: Secondary | ICD-10-CM | POA: Diagnosis not present

## 2014-06-23 DIAGNOSIS — R1310 Dysphagia, unspecified: Secondary | ICD-10-CM | POA: Diagnosis not present

## 2014-06-23 DIAGNOSIS — K2270 Barrett's esophagus without dysplasia: Secondary | ICD-10-CM | POA: Diagnosis not present

## 2014-06-23 DIAGNOSIS — K219 Gastro-esophageal reflux disease without esophagitis: Secondary | ICD-10-CM | POA: Diagnosis not present

## 2014-07-28 DIAGNOSIS — K319 Disease of stomach and duodenum, unspecified: Secondary | ICD-10-CM | POA: Diagnosis not present

## 2014-07-28 DIAGNOSIS — E2749 Other adrenocortical insufficiency: Secondary | ICD-10-CM | POA: Diagnosis not present

## 2014-07-28 DIAGNOSIS — K2970 Gastritis, unspecified, without bleeding: Secondary | ICD-10-CM | POA: Diagnosis not present

## 2014-07-28 DIAGNOSIS — J99 Respiratory disorders in diseases classified elsewhere: Secondary | ICD-10-CM | POA: Diagnosis not present

## 2014-07-28 DIAGNOSIS — R1310 Dysphagia, unspecified: Secondary | ICD-10-CM | POA: Diagnosis not present

## 2014-07-28 DIAGNOSIS — K2940 Chronic atrophic gastritis without bleeding: Secondary | ICD-10-CM | POA: Diagnosis not present

## 2014-07-28 DIAGNOSIS — E119 Type 2 diabetes mellitus without complications: Secondary | ICD-10-CM | POA: Diagnosis not present

## 2014-07-28 DIAGNOSIS — K2990 Gastroduodenitis, unspecified, without bleeding: Secondary | ICD-10-CM | POA: Diagnosis not present

## 2014-07-28 DIAGNOSIS — D869 Sarcoidosis, unspecified: Secondary | ICD-10-CM | POA: Diagnosis not present

## 2014-07-28 DIAGNOSIS — Z79899 Other long term (current) drug therapy: Secondary | ICD-10-CM | POA: Diagnosis not present

## 2014-08-16 DIAGNOSIS — M1710 Unilateral primary osteoarthritis, unspecified knee: Secondary | ICD-10-CM | POA: Diagnosis not present

## 2014-08-16 DIAGNOSIS — M48061 Spinal stenosis, lumbar region without neurogenic claudication: Secondary | ICD-10-CM | POA: Diagnosis not present

## 2014-10-13 DIAGNOSIS — E1165 Type 2 diabetes mellitus with hyperglycemia: Secondary | ICD-10-CM | POA: Diagnosis not present

## 2014-10-13 DIAGNOSIS — I10 Essential (primary) hypertension: Secondary | ICD-10-CM | POA: Diagnosis not present

## 2014-10-13 DIAGNOSIS — M859 Disorder of bone density and structure, unspecified: Secondary | ICD-10-CM | POA: Diagnosis not present

## 2014-10-13 DIAGNOSIS — E782 Mixed hyperlipidemia: Secondary | ICD-10-CM | POA: Diagnosis not present

## 2014-10-14 DIAGNOSIS — D860 Sarcoidosis of lung: Secondary | ICD-10-CM | POA: Diagnosis not present

## 2014-10-14 DIAGNOSIS — E1122 Type 2 diabetes mellitus with diabetic chronic kidney disease: Secondary | ICD-10-CM | POA: Diagnosis not present

## 2014-10-14 DIAGNOSIS — E782 Mixed hyperlipidemia: Secondary | ICD-10-CM | POA: Diagnosis not present

## 2014-10-14 DIAGNOSIS — E2740 Unspecified adrenocortical insufficiency: Secondary | ICD-10-CM | POA: Diagnosis not present

## 2014-10-14 DIAGNOSIS — M8580 Other specified disorders of bone density and structure, unspecified site: Secondary | ICD-10-CM | POA: Diagnosis not present

## 2014-10-14 DIAGNOSIS — K219 Gastro-esophageal reflux disease without esophagitis: Secondary | ICD-10-CM | POA: Diagnosis not present

## 2014-10-14 DIAGNOSIS — Z23 Encounter for immunization: Secondary | ICD-10-CM | POA: Diagnosis not present

## 2014-10-14 DIAGNOSIS — I10 Essential (primary) hypertension: Secondary | ICD-10-CM | POA: Diagnosis not present

## 2014-10-14 DIAGNOSIS — N183 Chronic kidney disease, stage 3 (moderate): Secondary | ICD-10-CM | POA: Diagnosis not present

## 2014-11-03 DIAGNOSIS — M8580 Other specified disorders of bone density and structure, unspecified site: Secondary | ICD-10-CM | POA: Diagnosis not present

## 2014-11-10 DIAGNOSIS — H04123 Dry eye syndrome of bilateral lacrimal glands: Secondary | ICD-10-CM | POA: Diagnosis not present

## 2014-11-10 DIAGNOSIS — H4010X1 Unspecified open-angle glaucoma, mild stage: Secondary | ICD-10-CM | POA: Diagnosis not present

## 2014-11-10 DIAGNOSIS — Z961 Presence of intraocular lens: Secondary | ICD-10-CM | POA: Diagnosis not present

## 2014-11-17 NOTE — Progress Notes (Signed)
[place], [place], [place]  [address] Phone: [phone] Fax:  [phone]  Date:  11/18/2014   ID:  Sonya Goodwin, DOB 13-Feb-1941, MRN [id number]  PCP:  No primary care provider on file.  Cardiologist:  Fransico Him, MD    History of Present Illness: Sonya Goodwin is a 73 y.o. female with a history of sarcoidosis, type II DM, GERD, HTN and dyslipidemia who presents today for followup of dyslipidemia and HTN.  She has been intolerant to multiple lipid lowering agents in the past.  She denies any chest pain, SOB, DOE, LE edema, dizziness or syncope.  She has had some palpitations and thought it was the fish oil and the palpitations resolved.    Wt Readings from Last 3 Encounters:  11/18/14 145 lb (65.772 kg)  11/16/13 137 lb 14.4 oz (62.551 kg)     Past Medical History  Diagnosis Date  . PONV (postoperative nausea and vomiting)   . Sarcoidosis   . Hx: UTI (urinary tract infection)   . History of kidney stones   . Anemia   . Cancer     skin cancer, "spot on pancreas"  . Diabetes mellitus without complication     Type II  . Deafness in right ear     wears hearing aids  . Arthritis   . GERD (gastroesophageal reflux disease)     takes Protonix  . Dysphagia   . Glaucoma   . Shortness of breath   . Sleep apnea     mild  . High cholesterol   . Adrenal insufficiency   . Thyroid nodule     f/u ultrasound done by Dr. Cyndie Chime, [place] 10/10/09 were stable, smaller , followed since 2005, neg biopsy sev years ago, no further u/s needed per Dr. Cyndie Chime  . Diabetic gastroparesis   . Diabetic neuropathy   . Barrett's esophagus   . Osteopenia     bone density T score-1.8 right femoral neck, 2010, -2.0 september 2012   . Low back pain     chronic axial- Dr. Nelva Bush  . Osteoarthritis of knee     followed by ortho, Dr. Theda Sers  . History of cervical dysplasia   . CKD (chronic kidney disease)     Stage III as of 09/2013, kidney stones as well  .  Spongiotic dermatitis     right breast, mild inflammation on biopsy  . Hypertension     Current Outpatient Prescriptions  Medication Sig Dispense Refill  . amitriptyline (ELAVIL) 25 MG tablet Take 25 mg by mouth at bedtime.    Marland Kitchen aspirin EC 81 MG tablet Take 81 mg by mouth daily.    . brinzolamide (AZOPT) 1 % ophthalmic suspension Place 1 drop into both eyes 2 (two) times daily.    . Cholecalciferol (VITAMIN D) 2000 UNITS CAPS Take 1 capsule by mouth daily.    Marland Kitchen doxazosin (CARDURA) 8 MG tablet Take 8 mg by mouth daily.    . ergocalciferol (VITAMIN D2) 50000 UNITS capsule Take 50,000 Units by mouth once a week.    . gabapentin (NEURONTIN) 100 MG capsule Take 100-600 mg by mouth 3 (three) times daily. Take 1 capsule in the morning, 1 capsule in the evening, and 6 capsules at bedtime    . meclizine (ANTIVERT) 25 MG tablet Take 25 mg by mouth 3 (three) times daily as needed for dizziness or nausea.     . methylPREDNISolone (MEDROL) 2 MG tablet Take 1 tablet (2 mg  total) by mouth daily. Please resume home dosing starting Tuesday 11/23/2013 30 tablet 0  . pantoprazole (PROTONIX) 40 MG tablet Take 40 mg by mouth daily.    . pioglitazone (ACTOS) 15 MG tablet Take 15 mg by mouth daily.    Marland Kitchen POTASSIUM CHLORIDE ER PO Take by mouth.    . predniSONE (DELTASONE) 10 MG tablet Take 1 tablet (10 mg total) by mouth once. Take one 10 mg tablet on Monday 11/22/2013, then resume home dosing of steroid on Tuesday 11/23/2013 1 tablet 0  . rOPINIRole (REQUIP) 0.5 MG tablet Take 0.5 mg by mouth 3 (three) times daily.    . rosuvastatin (CRESTOR) 10 MG tablet Take 10 mg by mouth. Twice a week    . sucralfate (CARAFATE) 1 G tablet Take 1 g by mouth 2 (two) times daily.    . traMADol (ULTRAM) 50 MG tablet Take 1 tablet (50 mg total) by mouth every 6 (six) hours as needed for moderate pain. 60 tablet 0  . triamterene-hydrochlorothiazide (MAXZIDE) 75-50 MG per tablet Take 1 tablet by mouth daily.     No current  facility-administered medications for this visit.    Allergies:    Allergies  Allergen Reactions  . Codeine Nausea And Vomiting    Pt reports that all opiates "make me violently ill".  . Other Itching and Rash    Food-cranberries  . Pain Patch [Menthol] Nausea And Vomiting    Not sure of brand  . Statins Other (See Comments)    Muscle wasting  . Aspirin   . Fenofibrate     arthralgias  . Fosamax [Alendronate Sodium]   . Miacalcin [Calcitonin ([place])]   . Nsaids     CKD III  . Zetia [Ezetimibe]     Social History:  The patient  reports that she has never smoked. She has never used smokeless tobacco. She reports that she does not drink alcohol or use illicit drugs.   Family History:  The patient's family history includes Cancer in her father; Heart attack in her brother; Heart disease in her brother.   ROS:  Please see the history of present illness.      All other systems reviewed and negative.   PHYSICAL EXAM: VS:  BP 134/82 mmHg  Pulse 86  Ht 5\' 5"  (1.651 m)  Wt 145 lb (65.772 kg)  BMI 24.13 kg/m2 Well nourished, well developed, in no acute distress HEENT: normal Neck: no JVD Cardiac:  normal S1, S2; RRR; no murmur Lungs:  clear to auscultation bilaterally, no wheezing, rhonchi or rales Abd: soft, nontender, no hepatomegaly Ext: no edema Skin: warm and dry Neuro:  CNs 2-12 intact, no focal abnormalities noted  EKG:  NSR with no ST changes     ASSESSMENT AND PLAN:  1. Dyslipidemia intolerant to all statins.  Her last LDL was 169 and TAGS 243.  She does not want to take fish oil due to it triggering palpitations.  I will refer her to lipid clinic to see if she is a candidate for PCSK 9 inhibitor. I will get a copy of her last lipids from Dr. Rex Kras.   She does not have CAD so may not be a candidate. 2. HTN - well controlled. Continue doxazosin/Maxide.  I will get a copy of her BMET from PCP 3. Type II DM - per PCP  Followup with me in 1 year  Signed, Fransico Him, MD [facility] 11/18/2014 9:06 AM

## 2014-11-18 ENCOUNTER — Encounter: Payer: Self-pay | Admitting: Cardiology

## 2014-11-18 ENCOUNTER — Ambulatory Visit (INDEPENDENT_AMBULATORY_CARE_PROVIDER_SITE_OTHER): Payer: Medicare Other | Admitting: Cardiology

## 2014-11-18 VITALS — BP 134/82 | HR 86 | Ht 65.0 in | Wt 145.0 lb

## 2014-11-18 DIAGNOSIS — E119 Type 2 diabetes mellitus without complications: Secondary | ICD-10-CM | POA: Diagnosis not present

## 2014-11-18 DIAGNOSIS — E785 Hyperlipidemia, unspecified: Secondary | ICD-10-CM

## 2014-11-18 DIAGNOSIS — I10 Essential (primary) hypertension: Secondary | ICD-10-CM | POA: Diagnosis not present

## 2014-11-18 NOTE — Patient Instructions (Addendum)
You have been referred to Lipid Clinic.  Your physician recommends that you continue on your current medications as directed. Please refer to the Current Medication list given to you today.  Your physician wants you to follow-up in: 1 year with Dr. Radford Pax. You will receive a reminder letter in the mail two months in advance. If you don't receive a letter, please call our office to schedule the follow-up appointment.

## 2014-12-01 ENCOUNTER — Ambulatory Visit: Payer: No Typology Code available for payment source | Admitting: Pharmacist

## 2014-12-02 ENCOUNTER — Other Ambulatory Visit: Payer: Self-pay | Admitting: Family Medicine

## 2014-12-02 ENCOUNTER — Other Ambulatory Visit: Payer: Medicare Other

## 2014-12-02 ENCOUNTER — Other Ambulatory Visit (HOSPITAL_BASED_OUTPATIENT_CLINIC_OR_DEPARTMENT_OTHER): Payer: Self-pay | Admitting: Family Medicine

## 2014-12-02 ENCOUNTER — Ambulatory Visit (HOSPITAL_BASED_OUTPATIENT_CLINIC_OR_DEPARTMENT_OTHER)
Admission: RE | Admit: 2014-12-02 | Discharge: 2014-12-02 | Disposition: A | Discharge status: Home / Self Care | Payer: Medicare Other | Source: Ambulatory Visit | Attending: Family Medicine | Admitting: Family Medicine

## 2014-12-02 DIAGNOSIS — I8390 Asymptomatic varicose veins of unspecified lower extremity: Secondary | ICD-10-CM | POA: Diagnosis not present

## 2014-12-02 DIAGNOSIS — E119 Type 2 diabetes mellitus without complications: Secondary | ICD-10-CM | POA: Insufficient documentation

## 2014-12-02 DIAGNOSIS — M7989 Other specified soft tissue disorders: Secondary | ICD-10-CM

## 2014-12-02 DIAGNOSIS — M79604 Pain in right leg: Secondary | ICD-10-CM

## 2014-12-02 DIAGNOSIS — M79661 Pain in right lower leg: Secondary | ICD-10-CM | POA: Diagnosis not present

## 2014-12-02 DIAGNOSIS — R05 Cough: Secondary | ICD-10-CM | POA: Diagnosis not present

## 2014-12-13 DIAGNOSIS — D860 Sarcoidosis of lung: Secondary | ICD-10-CM | POA: Diagnosis not present

## 2014-12-15 ENCOUNTER — Ambulatory Visit (INDEPENDENT_AMBULATORY_CARE_PROVIDER_SITE_OTHER): Payer: Medicare Other | Admitting: Pharmacist Clinician (PhC)/ Clinical Pharmacy Specialist

## 2014-12-15 ENCOUNTER — Encounter: Payer: Self-pay | Admitting: Pharmacist Clinician (PhC)/ Clinical Pharmacy Specialist

## 2014-12-15 VITALS — Ht 65.0 in | Wt 146.0 lb

## 2014-12-15 DIAGNOSIS — R8290 Unspecified abnormal findings in urine: Secondary | ICD-10-CM | POA: Diagnosis not present

## 2014-12-15 DIAGNOSIS — R109 Unspecified abdominal pain: Secondary | ICD-10-CM | POA: Diagnosis not present

## 2014-12-15 DIAGNOSIS — E785 Hyperlipidemia, unspecified: Secondary | ICD-10-CM | POA: Diagnosis not present

## 2014-12-15 MED ORDER — FENOFIBRATE 48 MG PO TABS: 48.0000 mg | ORAL_TABLET | Freq: Every day | ORAL | Status: DC

## 2014-12-15 NOTE — Patient Instructions (Signed)
Start with fenofibrate 45 mg once daily with food.  Call if you are unable to tolerate  [phone]  Continue to follow a high fiber diet.  Repeat labs in early February.

## 2014-12-15 NOTE — Progress Notes (Signed)
12/15/2014 [place] [birth year] [id number]   HPI:  Sonya Goodwin is a 73 y.o. female patient of Dr Radford Pax, who presents today for a lipid clinic evaluation.  Her medical history includes sarcoidosis, DMII, GERD and hypertension as well as dyslipidemia.  Pt has recently seen dermatologist for removal of skin tags from around eyes. Currently has one small yellow-ish tag on R eye, possible xanthoma.    RF:  DM, HTN.    Meds: no lipid lowering medications   Intolerant: multiple statins including Crestor 10mg  weekly.  Mostly historic, worked with [facility] for many years.  Fish oil - palpitations (both Lovaza and OTC)   Family history: her brother has previous MI, not sure at what age.    Diet: per patient eats apples for breakfast and snacks during day, soups/sandwiches at lunch, red meat for dinner (states husband only wants meat at dinner)   Exercise: unable due to history of back surgery (2014) and arthritis in knees    Labs: (from [facility]) 09/2014:  TC 290,  TG 486,  HDL 41,  LDL 175 02/2013:  TC 196, TG 160,  HDL 54,  LDL 108   Current Outpatient Prescriptions  Medication Sig Dispense Refill  . amitriptyline (ELAVIL) 25 MG tablet Take 25 mg by mouth at bedtime.    Marland Kitchen aspirin EC 81 MG tablet Take 81 mg by mouth daily.    . brinzolamide (AZOPT) 1 % ophthalmic suspension Place 1 drop into both eyes 2 (two) times daily.    . Cholecalciferol (VITAMIN D) 2000 UNITS CAPS Take 1 capsule by mouth daily.    Marland Kitchen doxazosin (CARDURA) 8 MG tablet Take 8 mg by mouth daily.    . ergocalciferol (VITAMIN D2) 50000 UNITS capsule Take 50,000 Units by mouth once a week.    . fenofibrate (TRICOR) 48 MG tablet Take 1 tablet (48 mg total) by mouth daily. 30 tablet 3  . gabapentin (NEURONTIN) 100 MG capsule Take 100-600 mg by mouth 3 (three) times daily. Take 1 capsule in the morning, 1 capsule in the evening, and 6 capsules at bedtime    . meclizine (ANTIVERT) 25 MG tablet Take 25 mg by  mouth 3 (three) times daily as needed for dizziness or nausea.     . methylPREDNISolone (MEDROL) 2 MG tablet Take 1 tablet (2 mg total) by mouth daily. Please resume home dosing starting Tuesday 11/23/2013 30 tablet 0  . pantoprazole (PROTONIX) 40 MG tablet Take 40 mg by mouth daily.    . pioglitazone (ACTOS) 15 MG tablet Take 15 mg by mouth daily.    Marland Kitchen POTASSIUM CHLORIDE ER PO Take by mouth.    . predniSONE (DELTASONE) 10 MG tablet Take 1 tablet (10 mg total) by mouth once. Take one 10 mg tablet on Monday 11/22/2013, then resume home dosing of steroid on Tuesday 11/23/2013 1 tablet 0  . rOPINIRole (REQUIP) 0.5 MG tablet Take 0.5 mg by mouth 3 (three) times daily.    . sucralfate (CARAFATE) 1 G tablet Take 1 g by mouth 2 (two) times daily.    . traMADol (ULTRAM) 50 MG tablet Take 1 tablet (50 mg total) by mouth every 6 (six) hours as needed for moderate pain. 60 tablet 0  . triamterene-hydrochlorothiazide (MAXZIDE) 75-50 MG per tablet Take 1 tablet by mouth daily.     No current facility-administered medications for this visit.    Allergies  Allergen Reactions  . Codeine Nausea And Vomiting    Pt reports that  all opiates "make me violently ill".  . Other Itching and Rash    Food-cranberries  . Pain Patch [Menthol] Nausea And Vomiting    Not sure of brand  . Statins Other (See Comments)    Muscle wasting, attempted Crestor 10mg  biw, then decreased to qw, but still had muscle problems.  Re-challenged after off x several weeks, problem resumed.   . Aspirin   . Fenofibrate     arthralgias  . Fosamax [Alendronate Sodium]   . Miacalcin [Calcitonin ([place])]   . Nsaids     CKD III  . Zetia [Ezetimibe]     Past Medical History  Diagnosis Date  . PONV (postoperative nausea and vomiting)   . Sarcoidosis   . Hx: UTI (urinary tract infection)   . History of kidney stones   . Anemia   . Cancer     skin cancer, "spot on pancreas"  . Diabetes mellitus without complication     Type II  .  Deafness in right ear     wears hearing aids  . Arthritis   . GERD (gastroesophageal reflux disease)     takes Protonix  . Dysphagia   . Glaucoma   . Shortness of breath   . Sleep apnea     mild  . High cholesterol   . Adrenal insufficiency   . Thyroid nodule     f/u ultrasound done by Dr. Cyndie Chime, [facility] [place] 10/10/09 were stable, smaller , followed since 2005, neg biopsy sev years ago, no further u/s needed per Dr. Cyndie Chime  . Diabetic gastroparesis   . Diabetic neuropathy   . Barrett's esophagus   . Osteopenia     bone density T score-1.8 right femoral neck, 2010, -2.0 september 2012   . Low back pain     chronic axial- Dr. Nelva Bush  . Osteoarthritis of knee     followed by ortho, Dr. Theda Sers  . History of cervical dysplasia   . CKD (chronic kidney disease)     Stage III as of 09/2013, kidney stones as well  . Spongiotic dermatitis     right breast, mild inflammation on biopsy  . Hypertension     Height 5\' 5"  (1.651 m), weight 146 lb (66.225 kg).    Tommy Medal PharmD CPP [facility]

## 2014-12-15 NOTE — Assessment & Plan Note (Signed)
Pt has been intolerant to statins, ezetimibe and fish oil for some time.  Even at Crestor 10 mg weekly she developed myalgias.  She has no recollection of trying fenofibrate, so we will start her on a low dose of that today, and if she tolerates it well, can consider increasing to the higher strength.  Will also contact her dermatologist, Dr. Crista Luria to determine if any of the tissue removed were xanthomas.  If so, she may qualify for a PCSK-9 inhibitor, as of now, she would not, having no documented ASCVD.   Dr. Radford Pax has already set her up for follow up lipid labs in February, we will see her later that same week.

## 2015-01-03 DIAGNOSIS — E2740 Unspecified adrenocortical insufficiency: Secondary | ICD-10-CM | POA: Diagnosis not present

## 2015-01-03 DIAGNOSIS — D860 Sarcoidosis of lung: Secondary | ICD-10-CM | POA: Diagnosis not present

## 2015-01-03 DIAGNOSIS — I129 Hypertensive chronic kidney disease with stage 1 through stage 4 chronic kidney disease, or unspecified chronic kidney disease: Secondary | ICD-10-CM | POA: Diagnosis not present

## 2015-01-03 DIAGNOSIS — N183 Chronic kidney disease, stage 3 (moderate): Secondary | ICD-10-CM | POA: Diagnosis not present

## 2015-01-03 DIAGNOSIS — E119 Type 2 diabetes mellitus without complications: Secondary | ICD-10-CM | POA: Diagnosis not present

## 2015-01-05 ENCOUNTER — Telehealth: Payer: Self-pay | Admitting: Pharmacist Clinician (PhC)/ Clinical Pharmacy Specialist

## 2015-01-05 NOTE — Telephone Encounter (Signed)
RN states chart was reviewed by herself and PA, no indication of xanthomas removed from patient

## 2015-01-05 NOTE — Telephone Encounter (Signed)
LMOM for Dr. Marisue Ivan RN.  Need to know if tissue removed from around eyes were xanthomas

## 2015-01-18 ENCOUNTER — Encounter: Payer: Self-pay | Admitting: Pharmacist Clinician (PhC)/ Clinical Pharmacy Specialist

## 2015-01-18 DIAGNOSIS — R109 Unspecified abdominal pain: Secondary | ICD-10-CM | POA: Diagnosis not present

## 2015-01-18 DIAGNOSIS — B0229 Other postherpetic nervous system involvement: Secondary | ICD-10-CM | POA: Diagnosis not present

## 2015-01-18 DIAGNOSIS — E782 Mixed hyperlipidemia: Secondary | ICD-10-CM | POA: Diagnosis not present

## 2015-01-18 DIAGNOSIS — E876 Hypokalemia: Secondary | ICD-10-CM | POA: Diagnosis not present

## 2015-02-02 ENCOUNTER — Other Ambulatory Visit (INDEPENDENT_AMBULATORY_CARE_PROVIDER_SITE_OTHER): Payer: Medicare Other | Admitting: *Deleted

## 2015-02-02 DIAGNOSIS — E7800 Pure hypercholesterolemia, unspecified: Secondary | ICD-10-CM

## 2015-02-02 LAB — LIPID PANEL
CHOL/HDL RATIO: 5
Cholesterol: 271 mg/dL — ABNORMAL HIGH (ref 0–200)
HDL: 50.8 mg/dL (ref >39.00)
NonHDL: 220.2
TRIGLYCERIDES: 359 mg/dL — AB (ref 0.0–149.0)
VLDL: 71.8 mg/dL — ABNORMAL HIGH (ref 0.0–40.0)

## 2015-02-02 LAB — HEPATIC FUNCTION PANEL
ALT: 16 U/L (ref 0–35)
AST: 19 U/L (ref 0–37)
Albumin: 4 g/dL (ref 3.5–5.2)
Alkaline Phosphatase: 32 U/L — ABNORMAL LOW (ref 39–117)
Bilirubin, Direct: 0.1 mg/dL (ref 0.0–0.3)
TOTAL PROTEIN: 6.4 g/dL (ref 6.0–8.3)
Total Bilirubin: 0.4 mg/dL (ref 0.2–1.2)

## 2015-02-02 LAB — LDL CHOLESTEROL, DIRECT: Direct LDL: 164 mg/dL

## 2015-02-02 NOTE — Addendum Note (Signed)
Addended by: Eulis Foster on: 02/02/2015 07:34 AM   Modules accepted: Orders

## 2015-02-09 ENCOUNTER — Encounter: Payer: Self-pay | Admitting: Pharmacist Clinician (PhC)/ Clinical Pharmacy Specialist

## 2015-02-09 ENCOUNTER — Ambulatory Visit (INDEPENDENT_AMBULATORY_CARE_PROVIDER_SITE_OTHER): Payer: Medicare Other | Admitting: Pharmacist Clinician (PhC)/ Clinical Pharmacy Specialist

## 2015-02-09 VITALS — Ht 65.0 in | Wt 147.5 lb

## 2015-02-09 DIAGNOSIS — E785 Hyperlipidemia, unspecified: Secondary | ICD-10-CM

## 2015-02-09 NOTE — Patient Instructions (Signed)
Try increasing your fenofibrate to 2 tablets daily, always take this with food.  If you can tolerate this for a week, call and we will increase to the higher strength tablet.  Call Kristin at [phone].  Increase the amount of fiber in your diet as you can.  Try Metamucil fiber capsules, 1 daily and increase to 2 daily if tolerated.  Repeat labs in 3 months

## 2015-02-09 NOTE — Progress Notes (Signed)
02/09/2015 [place] 28-Nov-1941 [id number]   HPI:  Sonya Goodwin is a 74 y.o. female patient of Dr Radford Pax, who presents today for a lipid clinic evaluation.  Her medical history includes sarcoidosis, DMII, GERD and hypertension as well as dyslipidemia.  She has no history of CAD, lipid management is for primary prevention  RF:  DM, HTN.    Meds: fenofibrate 48 mg daily   Intolerant: multiple statins including Crestor 10mg  weekly.  Mostly historic, worked with [facility] for many years.  Fish oil - palpitations (both Lovaza and OTC), ezetimibe caused muscle problems.  Will not attempt coleveselam due to pt having diabetic gastroparesis  Family history: her brother has previous MI, not sure at what age.    Diet: per patient eats apples for breakfast and snacks during day, soups/sandwiches at lunch, red meat for dinner (states husband only wants meat at dinner)   Exercise: unable due to history of back surgery (2014) and arthritis in knees    Labs: (from [facility]) 01/2013: TC 271, TG 359, HDL 50.8, LDL 164, nonHDL 220.2 (on fenofibrate 48) 09/2014:  TC 290,  TG 486,  HDL 41,  LDL 175 02/2013:  TC 196, TG 160,  HDL 54,  LDL 108   Current Outpatient Prescriptions  Medication Sig Dispense Refill  . amitriptyline (ELAVIL) 25 MG tablet Take 25 mg by mouth at bedtime.    Marland Kitchen aspirin EC 81 MG tablet Take 81 mg by mouth daily.    . brinzolamide (AZOPT) 1 % ophthalmic suspension Place 1 drop into both eyes 2 (two) times daily.    . Cholecalciferol (VITAMIN D) 2000 UNITS CAPS Take 1 capsule by mouth daily.    Marland Kitchen doxazosin (CARDURA) 8 MG tablet Take 8 mg by mouth daily.    . ergocalciferol (VITAMIN D2) 50000 UNITS capsule Take 50,000 Units by mouth once a week.    . fenofibrate (TRICOR) 48 MG tablet Take 1 tablet (48 mg total) by mouth daily. 30 tablet 3  . gabapentin (NEURONTIN) 300 MG capsule Take 300 mg by mouth 3 (three) times daily.  0  . meclizine (ANTIVERT) 25 MG tablet Take 25  mg by mouth 3 (three) times daily as needed for dizziness or nausea.     . methylPREDNISolone (MEDROL) 2 MG tablet Take 1 tablet (2 mg total) by mouth daily. Please resume home dosing starting Tuesday 11/23/2013 30 tablet 0  . ONE TOUCH ULTRA TEST test strip Use to test your blood sugar once daily as directed  6  . pantoprazole (PROTONIX) 40 MG tablet Take 40 mg by mouth daily.    . pioglitazone (ACTOS) 15 MG tablet Take 15 mg by mouth daily.    Marland Kitchen POTASSIUM CHLORIDE ER PO Take by mouth.    . predniSONE (DELTASONE) 10 MG tablet Take 1 tablet (10 mg total) by mouth once. Take one 10 mg tablet on Monday 11/22/2013, then resume home dosing of steroid on Tuesday 11/23/2013 1 tablet 0  . rOPINIRole (REQUIP) 0.5 MG tablet Take 0.5 mg by mouth 3 (three) times daily.    . sucralfate (CARAFATE) 1 G tablet Take 1 g by mouth 2 (two) times daily.    . traMADol (ULTRAM) 50 MG tablet Take 1 tablet (50 mg total) by mouth every 6 (six) hours as needed for moderate pain. 60 tablet 0  . triamterene-hydrochlorothiazide (MAXZIDE) 75-50 MG per tablet Take 1 tablet by mouth daily.     No current facility-administered medications for this visit.  Allergies  Allergen Reactions  . Metoclopramide Other (See Comments)    OTHER REACTION  . Codeine Nausea And Vomiting    Pt reports that all opiates "make me violently ill".  . Other Itching and Rash    Food-cranberries  . Pain Patch [Menthol] Nausea And Vomiting    Not sure of brand  . Statins Other (See Comments)    Muscle wasting, attempted Crestor 10mg  biw, then decreased to qw, but still had muscle problems.  Re-challenged after off x several weeks, problem resumed.   . Aspirin   . Fenofibrate     arthralgias  . Fosamax [Alendronate Sodium]   . Miacalcin [Calcitonin ([place])]   . Nsaids     CKD III  . Zetia [Ezetimibe]     Past Medical History  Diagnosis Date  . PONV (postoperative nausea and vomiting)   . Sarcoidosis   . Hx: UTI (urinary tract  infection)   . History of kidney stones   . Anemia   . Cancer     skin cancer, "spot on pancreas"  . Diabetes mellitus without complication     Type II  . Deafness in right ear     wears hearing aids  . Arthritis   . GERD (gastroesophageal reflux disease)     takes Protonix  . Dysphagia   . Glaucoma   . Shortness of breath   . Sleep apnea     mild  . High cholesterol   . Adrenal insufficiency   . Thyroid nodule     f/u ultrasound done by Dr. Cyndie Chime, [facility] [place] 10/10/09 were stable, smaller , followed since 2005, neg biopsy sev years ago, no further u/s needed per Dr. Cyndie Chime  . Diabetic gastroparesis   . Diabetic neuropathy   . Barrett's esophagus   . Osteopenia     bone density T score-1.8 right femoral neck, 2010, -2.0 september 2012   . Low back pain     chronic axial- Dr. Nelva Bush  . Osteoarthritis of knee     followed by ortho, Dr. Theda Sers  . History of cervical dysplasia   . CKD (chronic kidney disease)     Stage III as of 09/2013, kidney stones as well  . Spongiotic dermatitis     right breast, mild inflammation on biopsy  . Hypertension     Height 5\' 5"  (1.651 m), weight 147 lb 8 oz (66.906 kg).    Tommy Medal PharmD CPP [facility]

## 2015-02-09 NOTE — Assessment & Plan Note (Signed)
Pt labs are essentially unchanged.  She has been tolerating the fenofibrate 48 mg daily, however it doesn't appear to have impacted her triglycerides.  She states has been taking with food.  I have asked that she try doubling the dose for a week to see if she can tolerate a higher dose.  If she does okay will call in the 134 mg dose.  Hopefully this can bring her TG down some. As for her LDL, patient is primary prevention, so does not qualify for a PCSK-9 based on her lack of ASCVD or Heterozygous/homozygous familial hypercholesterolemia.  Patient is aware of this and knows that we don't have any other medication options to treat her.  Encouraged her to increase fiber in her diet, decrease starchy foods.  Will repeat labs in 3 months.

## 2015-03-01 DIAGNOSIS — R109 Unspecified abdominal pain: Secondary | ICD-10-CM | POA: Diagnosis not present

## 2015-03-01 DIAGNOSIS — B029 Zoster without complications: Secondary | ICD-10-CM | POA: Diagnosis not present

## 2015-03-02 ENCOUNTER — Other Ambulatory Visit: Payer: Self-pay | Admitting: Family Medicine

## 2015-03-03 ENCOUNTER — Other Ambulatory Visit: Payer: Self-pay | Admitting: Family Medicine

## 2015-03-03 DIAGNOSIS — R1031 Right lower quadrant pain: Secondary | ICD-10-CM

## 2015-03-09 ENCOUNTER — Ambulatory Visit
Admission: RE | Admit: 2015-03-09 | Discharge: 2015-03-09 | Disposition: A | Discharge status: Home / Self Care | Payer: Medicare Other | Source: Ambulatory Visit | Attending: Family Medicine | Admitting: Family Medicine

## 2015-03-09 DIAGNOSIS — R109 Unspecified abdominal pain: Secondary | ICD-10-CM | POA: Diagnosis not present

## 2015-03-09 DIAGNOSIS — R1031 Right lower quadrant pain: Secondary | ICD-10-CM

## 2015-03-09 DIAGNOSIS — R102 Pelvic and perineal pain: Secondary | ICD-10-CM | POA: Diagnosis not present

## 2015-03-09 DIAGNOSIS — Z9049 Acquired absence of other specified parts of digestive tract: Secondary | ICD-10-CM | POA: Diagnosis not present

## 2015-03-22 DIAGNOSIS — S99929A Unspecified injury of unspecified foot, initial encounter: Secondary | ICD-10-CM | POA: Diagnosis not present

## 2015-03-30 DIAGNOSIS — L570 Actinic keratosis: Secondary | ICD-10-CM | POA: Diagnosis not present

## 2015-03-30 DIAGNOSIS — Z85828 Personal history of other malignant neoplasm of skin: Secondary | ICD-10-CM | POA: Diagnosis not present

## 2015-03-30 DIAGNOSIS — D239 Other benign neoplasm of skin, unspecified: Secondary | ICD-10-CM | POA: Diagnosis not present

## 2015-03-30 DIAGNOSIS — L821 Other seborrheic keratosis: Secondary | ICD-10-CM | POA: Diagnosis not present

## 2015-03-30 DIAGNOSIS — L814 Other melanin hyperpigmentation: Secondary | ICD-10-CM | POA: Diagnosis not present

## 2015-03-30 DIAGNOSIS — L810 Postinflammatory hyperpigmentation: Secondary | ICD-10-CM | POA: Diagnosis not present

## 2015-04-04 DIAGNOSIS — N183 Chronic kidney disease, stage 3 (moderate): Secondary | ICD-10-CM | POA: Diagnosis not present

## 2015-04-06 DIAGNOSIS — N183 Chronic kidney disease, stage 3 (moderate): Secondary | ICD-10-CM | POA: Diagnosis not present

## 2015-04-06 DIAGNOSIS — I129 Hypertensive chronic kidney disease with stage 1 through stage 4 chronic kidney disease, or unspecified chronic kidney disease: Secondary | ICD-10-CM | POA: Diagnosis not present

## 2015-04-06 DIAGNOSIS — N390 Urinary tract infection, site not specified: Secondary | ICD-10-CM | POA: Diagnosis not present

## 2015-04-06 DIAGNOSIS — E119 Type 2 diabetes mellitus without complications: Secondary | ICD-10-CM | POA: Diagnosis not present

## 2015-04-06 DIAGNOSIS — D860 Sarcoidosis of lung: Secondary | ICD-10-CM | POA: Diagnosis not present

## 2015-04-06 DIAGNOSIS — E2740 Unspecified adrenocortical insufficiency: Secondary | ICD-10-CM | POA: Diagnosis not present

## 2015-04-14 DIAGNOSIS — M859 Disorder of bone density and structure, unspecified: Secondary | ICD-10-CM | POA: Diagnosis not present

## 2015-04-14 DIAGNOSIS — E2740 Unspecified adrenocortical insufficiency: Secondary | ICD-10-CM | POA: Diagnosis not present

## 2015-04-14 DIAGNOSIS — M8580 Other specified disorders of bone density and structure, unspecified site: Secondary | ICD-10-CM | POA: Diagnosis not present

## 2015-04-14 DIAGNOSIS — I10 Essential (primary) hypertension: Secondary | ICD-10-CM | POA: Diagnosis not present

## 2015-04-14 DIAGNOSIS — E1122 Type 2 diabetes mellitus with diabetic chronic kidney disease: Secondary | ICD-10-CM | POA: Diagnosis not present

## 2015-04-14 DIAGNOSIS — D860 Sarcoidosis of lung: Secondary | ICD-10-CM | POA: Diagnosis not present

## 2015-04-14 DIAGNOSIS — E782 Mixed hyperlipidemia: Secondary | ICD-10-CM | POA: Diagnosis not present

## 2015-04-14 DIAGNOSIS — N183 Chronic kidney disease, stage 3 (moderate): Secondary | ICD-10-CM | POA: Diagnosis not present

## 2015-05-09 ENCOUNTER — Other Ambulatory Visit (INDEPENDENT_AMBULATORY_CARE_PROVIDER_SITE_OTHER): Payer: Medicare Other | Admitting: *Deleted

## 2015-05-09 DIAGNOSIS — E785 Hyperlipidemia, unspecified: Secondary | ICD-10-CM

## 2015-05-09 LAB — HEPATIC FUNCTION PANEL
ALBUMIN: 3.9 g/dL (ref 3.5–5.2)
ALK PHOS: 39 U/L (ref 39–117)
ALT: 17 U/L (ref 0–35)
AST: 24 U/L (ref 0–37)
Bilirubin, Direct: 0.1 mg/dL (ref 0.0–0.3)
TOTAL PROTEIN: 6.9 g/dL (ref 6.0–8.3)
Total Bilirubin: 0.4 mg/dL (ref 0.2–1.2)

## 2015-05-09 LAB — LIPID PANEL
CHOL/HDL RATIO: 7
CHOLESTEROL: 286 mg/dL — AB (ref 0–200)
HDL: 44 mg/dL (ref >39.00)
Triglycerides: 436 mg/dL — ABNORMAL HIGH (ref 0.0–149.0)

## 2015-05-09 LAB — LDL CHOLESTEROL, DIRECT: Direct LDL: 169 mg/dL

## 2015-05-25 DIAGNOSIS — M1711 Unilateral primary osteoarthritis, right knee: Secondary | ICD-10-CM | POA: Diagnosis not present

## 2015-05-25 DIAGNOSIS — M170 Bilateral primary osteoarthritis of knee: Secondary | ICD-10-CM | POA: Diagnosis not present

## 2015-05-25 DIAGNOSIS — M1712 Unilateral primary osteoarthritis, left knee: Secondary | ICD-10-CM | POA: Diagnosis not present

## 2015-06-16 DIAGNOSIS — M170 Bilateral primary osteoarthritis of knee: Secondary | ICD-10-CM | POA: Diagnosis not present

## 2015-06-19 DIAGNOSIS — Z961 Presence of intraocular lens: Secondary | ICD-10-CM | POA: Diagnosis not present

## 2015-06-19 DIAGNOSIS — I10 Essential (primary) hypertension: Secondary | ICD-10-CM | POA: Diagnosis not present

## 2015-06-19 DIAGNOSIS — H04123 Dry eye syndrome of bilateral lacrimal glands: Secondary | ICD-10-CM | POA: Diagnosis not present

## 2015-06-19 DIAGNOSIS — H409 Unspecified glaucoma: Secondary | ICD-10-CM | POA: Diagnosis not present

## 2015-06-20 DIAGNOSIS — D860 Sarcoidosis of lung: Secondary | ICD-10-CM | POA: Diagnosis not present

## 2015-06-23 DIAGNOSIS — M170 Bilateral primary osteoarthritis of knee: Secondary | ICD-10-CM | POA: Diagnosis not present

## 2015-06-30 DIAGNOSIS — M170 Bilateral primary osteoarthritis of knee: Secondary | ICD-10-CM | POA: Diagnosis not present

## 2015-08-11 DIAGNOSIS — M170 Bilateral primary osteoarthritis of knee: Secondary | ICD-10-CM | POA: Diagnosis not present

## 2015-08-15 DIAGNOSIS — N183 Chronic kidney disease, stage 3 (moderate): Secondary | ICD-10-CM | POA: Diagnosis not present

## 2015-08-17 DIAGNOSIS — N183 Chronic kidney disease, stage 3 (moderate): Secondary | ICD-10-CM | POA: Diagnosis not present

## 2015-08-17 DIAGNOSIS — I129 Hypertensive chronic kidney disease with stage 1 through stage 4 chronic kidney disease, or unspecified chronic kidney disease: Secondary | ICD-10-CM | POA: Diagnosis not present

## 2015-08-17 DIAGNOSIS — E119 Type 2 diabetes mellitus without complications: Secondary | ICD-10-CM | POA: Diagnosis not present

## 2015-08-17 DIAGNOSIS — E2740 Unspecified adrenocortical insufficiency: Secondary | ICD-10-CM | POA: Diagnosis not present

## 2015-08-17 DIAGNOSIS — D860 Sarcoidosis of lung: Secondary | ICD-10-CM | POA: Diagnosis not present

## 2015-09-13 ENCOUNTER — Encounter: Payer: Self-pay | Admitting: Cardiology

## 2015-09-15 DIAGNOSIS — M170 Bilateral primary osteoarthritis of knee: Secondary | ICD-10-CM | POA: Diagnosis not present

## 2015-10-03 ENCOUNTER — Other Ambulatory Visit: Payer: Self-pay | Admitting: Gastroenterology

## 2015-10-27 DIAGNOSIS — Z23 Encounter for immunization: Secondary | ICD-10-CM | POA: Diagnosis not present

## 2015-10-27 DIAGNOSIS — R8290 Unspecified abnormal findings in urine: Secondary | ICD-10-CM | POA: Diagnosis not present

## 2015-10-27 DIAGNOSIS — G2581 Restless legs syndrome: Secondary | ICD-10-CM | POA: Diagnosis not present

## 2015-10-27 DIAGNOSIS — M8588 Other specified disorders of bone density and structure, other site: Secondary | ICD-10-CM | POA: Diagnosis not present

## 2015-10-27 DIAGNOSIS — E1122 Type 2 diabetes mellitus with diabetic chronic kidney disease: Secondary | ICD-10-CM | POA: Diagnosis not present

## 2015-10-27 DIAGNOSIS — M899 Disorder of bone, unspecified: Secondary | ICD-10-CM | POA: Diagnosis not present

## 2015-10-27 DIAGNOSIS — M7120 Synovial cyst of popliteal space [Baker], unspecified knee: Secondary | ICD-10-CM | POA: Diagnosis not present

## 2015-10-27 DIAGNOSIS — I10 Essential (primary) hypertension: Secondary | ICD-10-CM | POA: Diagnosis not present

## 2015-11-13 DIAGNOSIS — M8588 Other specified disorders of bone density and structure, other site: Secondary | ICD-10-CM | POA: Diagnosis not present

## 2015-11-13 DIAGNOSIS — M859 Disorder of bone density and structure, unspecified: Secondary | ICD-10-CM | POA: Diagnosis not present

## 2015-12-11 ENCOUNTER — Encounter (HOSPITAL_COMMUNITY): Payer: Self-pay | Admitting: *Deleted

## 2015-12-12 ENCOUNTER — Encounter (HOSPITAL_COMMUNITY): Payer: Self-pay | Admitting: *Deleted

## 2015-12-12 DIAGNOSIS — D860 Sarcoidosis of lung: Secondary | ICD-10-CM | POA: Diagnosis not present

## 2015-12-14 ENCOUNTER — Encounter: Payer: Self-pay | Admitting: Cardiology

## 2015-12-14 ENCOUNTER — Ambulatory Visit (INDEPENDENT_AMBULATORY_CARE_PROVIDER_SITE_OTHER): Payer: Medicare Other | Admitting: Cardiology

## 2015-12-14 VITALS — BP 136/74 | HR 91 | Ht 65.0 in | Wt 144.6 lb

## 2015-12-14 DIAGNOSIS — E785 Hyperlipidemia, unspecified: Secondary | ICD-10-CM | POA: Diagnosis not present

## 2015-12-14 DIAGNOSIS — I10 Essential (primary) hypertension: Secondary | ICD-10-CM

## 2015-12-14 LAB — BASIC METABOLIC PANEL
BUN: 24 mg/dL (ref 7–25)
CHLORIDE: 99 mmol/L (ref 98–110)
CO2: 29 mmol/L (ref 20–31)
Calcium: 9.1 mg/dL (ref 8.6–10.4)
Creat: 1.28 mg/dL — ABNORMAL HIGH (ref 0.60–0.93)
Glucose, Bld: 87 mg/dL (ref 65–99)
Potassium: 3.4 mmol/L — ABNORMAL LOW (ref 3.5–5.3)
Sodium: 139 mmol/L (ref 135–146)

## 2015-12-14 LAB — HEPATIC FUNCTION PANEL
ALBUMIN: 4.4 g/dL (ref 3.6–5.1)
ALT: 26 U/L (ref 6–29)
AST: 36 U/L — ABNORMAL HIGH (ref 10–35)
Alkaline Phosphatase: 37 U/L (ref 33–130)
BILIRUBIN INDIRECT: 0.5 mg/dL (ref 0.2–1.2)
Bilirubin, Direct: 0.1 mg/dL (ref <=0.2)
Total Bilirubin: 0.6 mg/dL (ref 0.2–1.2)
Total Protein: 7.4 g/dL (ref 6.1–8.1)

## 2015-12-14 LAB — LIPID PANEL
Cholesterol: 275 mg/dL — ABNORMAL HIGH (ref 125–200)
HDL: 41 mg/dL — ABNORMAL LOW (ref >=46)
TRIGLYCERIDES: 406 mg/dL — AB (ref <150)
Total CHOL/HDL Ratio: 6.7 Ratio — ABNORMAL HIGH (ref <=5.0)

## 2015-12-14 NOTE — Patient Instructions (Signed)

## 2015-12-14 NOTE — Progress Notes (Signed)
Cardiology Office Note   Date:  12/14/2015   ID:  Sonya Goodwin, DOB 1941-09-08, MRN [id number]  PCP:  Gennette Pac, MD    Chief Complaint  Patient presents with  . Hypertension  . Hyperlipidemia      History of Present Illness: Sonya Goodwin is a 74 y.o. female with a history of sarcoidosis, type II DM, GERD, HTN and dyslipidemia who presents today for followup of dyslipidemia and HTN. She has been intolerant to multiple lipid lowering agents in the past. She says that the high dose fish oil gave her palpitations.  She did not tolerate the fenofibrate due to leg cramps so she is back to OTC fish oil.  She denies any chest pain, SOB, DOE, LE edema, dizziness, palpitations or syncope.    Past Medical History  Diagnosis Date  . PONV (postoperative nausea and vomiting)   . Sarcoidosis ([place])   . Hx: UTI (urinary tract infection)   . History of kidney stones   . Anemia   . Cancer ([place])     skin cancer, "spot on pancreas"  . Diabetes mellitus without complication ([place])     Type II  . Deafness in right ear     wears hearing aids  . Arthritis   . GERD (gastroesophageal reflux disease)     takes Protonix  . Dysphagia   . Glaucoma   . Shortness of breath   . Sleep apnea     mild  . High cholesterol   . Adrenal insufficiency ([place])   . Thyroid nodule     f/u ultrasound done by Dr. Cyndie Chime, [facility] [place] 10/10/09 were stable, smaller , followed since 2005, neg biopsy sev years ago, no further u/s needed per Dr. Cyndie Chime  . Diabetic gastroparesis ([place])   . Diabetic neuropathy ([place])   . Barrett's esophagus   . Osteopenia     bone density T score-1.8 right femoral neck, 2010, -2.0 september 2012   . Low back pain     chronic axial- Dr. Nelva Bush  . Osteoarthritis of knee     followed by ortho, Dr. Theda Sers  . History of cervical dysplasia   . CKD (chronic kidney disease)     Stage III as of 09/2013, kidney stones as well  . Spongiotic  dermatitis     right breast, mild inflammation on biopsy  . Hypertension     Past Surgical History  Procedure Laterality Date  . Esophagogastroduodenoscopy (egd) with esophageal dilation    . Colonoscopy    . Cataract surgery      both eyes  . Lumbar laminectomy/decompression microdiscectomy N/A 11/18/2013    Procedure: DECOMPRESSION L3 - L5  2 LEVELS;  Surgeon: Melina Schools, MD;  Location: [facility];  Service: Orthopedics;  Laterality: N/A;  . Dilation and curettage of uterus  1965  . Cholecystectomy  1981  . Bronchoscopy  1984  . Orif distal radius fracture  2010  . Partial hysterectomy  1971  . Knee arthroscopy  2011     Current Outpatient Prescriptions  Medication Sig Dispense Refill  . amitriptyline (ELAVIL) 25 MG tablet Take 25 mg by mouth at bedtime.    Marland Kitchen aspirin EC 81 MG tablet Take 81 mg by mouth at bedtime.     . brinzolamide (AZOPT) 1 % ophthalmic suspension Place 1 drop into both eyes 2 (two) times daily.    . ergocalciferol (VITAMIN  D2) 50000 UNITS capsule Take 50,000 Units by mouth once a week. Saturdays    . gabapentin (NEURONTIN) 100 MG capsule Take 100-600 mg by mouth 3 (three) times daily. 100mg  in the AM, 100mg  midday, 600mg  at bedtime.    . meclizine (ANTIVERT) 25 MG tablet Take 25 mg by mouth 2 (two) times daily as needed for dizziness or nausea.     . methylPREDNISolone (MEDROL) 2 MG tablet Take 1 tablet (2 mg total) by mouth daily. Please resume home dosing starting Tuesday 11/23/2013 30 tablet 0  . Omega-3 Fatty Acids (FISH OIL PO) Take 2 capsules by mouth daily.    . ONE TOUCH ULTRA TEST test strip Use to test your blood sugar once daily as directed  6  . pantoprazole (PROTONIX) 40 MG tablet Take 40 mg by mouth daily as needed (heartburn).     . pioglitazone (ACTOS) 15 MG tablet Take 15 mg by mouth daily.    . Potassium Chloride CR (MICRO-K) 8 MEQ CPCR capsule CR Take 8 mEq by mouth daily with supper.    Marland Kitchen rOPINIRole (REQUIP) 1 MG tablet Take 1 mg by mouth at  bedtime.    . sucralfate (CARAFATE) 1 G tablet Take 1 g by mouth 2 (two) times daily as needed (heartburn).     . traMADol (ULTRAM) 50 MG tablet Take 1 tablet (50 mg total) by mouth every 6 (six) hours as needed for moderate pain. 60 tablet 0  . triamterene-hydrochlorothiazide (MAXZIDE) 75-50 MG tablet Take 1 tablet by mouth daily.    . valsartan (DIOVAN) 40 MG tablet Take 40 mg by mouth daily.     No current facility-administered medications for this visit.    Allergies:   Metoclopramide; Codeine; Other; Pain patch; Statins; Alendronate; Aspirin; Fenofibrate; Fosamax; Miacalcin; Nsaids; and Zetia    Social History:  The patient  reports that she has never smoked. She has never used smokeless tobacco. She reports that she does not drink alcohol or use illicit drugs.   Family History:  The patient's family history includes Cancer in her father; Heart attack in her brother; Heart disease in her brother.    ROS:  Please see the history of present illness.   Otherwise, review of systems are positive for none.   All other systems are reviewed and negative.    PHYSICAL EXAM: VS:  BP 136/74 mmHg  Pulse 91  Ht 5\' 5"  (1.651 m)  Wt 144 lb 9.6 oz (65.59 kg)  BMI 24.06 kg/m2 , BMI Body mass index is 24.06 kg/(m^2). GEN: Well nourished, well developed, in no acute distress HEENT: normal Neck: no JVD, carotid bruits, or masses Cardiac: RRR; no murmurs, rubs, or gallops,no edema  Respiratory:  clear to auscultation bilaterally, normal work of breathing GI: soft, nontender, nondistended, + BS MS: no deformity or atrophy Skin: warm and dry, no rash Neuro:  Strength and sensation are intact Psych: euthymic mood, full affect   EKG:  EKG was ordered today and showed NSR with no ST changes    Recent Labs: 05/09/2015: ALT 17    Lipid Panel    Component Value Date/Time   CHOL 286* 05/09/2015 0921   TRIG * 05/09/2015 0921    436.0 Triglyceride is over 400; calculations on Lipids are  invalid.   HDL 44.00 05/09/2015 0921   CHOLHDL 7 05/09/2015 0921   VLDL 71.8* 02/02/2015 0734   LDLDIRECT 169.0 05/09/2015 0921      Wt Readings from Last 3 Encounters:  12/14/15 144  lb 9.6 oz (65.59 kg)  02/09/15 147 lb 8 oz (66.906 kg)  12/15/14 146 lb (66.225 kg)    ASSESSMENT AND PLAN:  1. Dyslipidemia intolerant to all statins. Her last LDL was 169 and TAGS 243. She does not want to take high dose fish oil due to it triggering palpitations.  She did not tolerate fenofibrates due to leg pain.   She was seen in lipid clinic and is not a candidate for PCSK 9 drug due to lack of CAD or homo/heterozygous familial hyperlipidemia.   2. HTN - well controlled. Continue Valsartan/Maxide.Check FLP and ALT today 3. Type II DM - per PCP    Current medicines are reviewed at length with the patient today.  The patient does not have concerns regarding medicines.  The following changes have been made:  no change  Labs/ tests ordered today: See above Assessment and Plan No orders of the defined types were placed in this encounter.     Disposition:   FU with me in 1 year  Signed, Sueanne Margarita, MD  12/14/2015 9:15 AM    [facility] [place], [place], [place]  [address] Phone: [phone]; Fax: [phone]

## 2015-12-15 ENCOUNTER — Telehealth: Payer: Self-pay

## 2015-12-15 DIAGNOSIS — I10 Essential (primary) hypertension: Secondary | ICD-10-CM

## 2015-12-15 MED ORDER — POTASSIUM CHLORIDE ER 10 MEQ PO TBCR: 10.0000 meq | EXTENDED_RELEASE_TABLET | Freq: Every day | ORAL | Status: AC

## 2015-12-15 NOTE — Telephone Encounter (Signed)
Notes Recorded by Aris Georgia, Bier on 12/15/2015 at 11:30 AM TG remain elevated. She is on fenofibrate and has intolerances to other therapies. Unsure what most recent A1c was but blood glucose normal so not sure how much that may be contributing. She does not qualify for PCSK-9. She may qualify for some other clinical trials we are getting ready to start enrollment with if she is interested. Otherwise, continued diet and lifestyle modification recommendations. Notes Recorded by Sueanne Margarita, MD on 12/14/2015 at 9:00 PM Please refer back to lipid clinic. Change potassium to Kdur 9meq daily and recheck BMET in 1 week    Informed patient of results and verbal understanding expressed.  Instructed patient to change potassium to KDUR 10 meq daily. BMET scheduled for 12/28 per patient request.  Patient st she is not taking fenofibrate - it made her legs hurt. She refuses to start any new medications or to see if she qualifies for any new clinical trials at this time.  Encouraged low-fat, low-carb diet and instructed patient to increase fiber. Patient agrees with treatment plan.

## 2015-12-15 NOTE — Telephone Encounter (Signed)
-----   Message from Sueanne Margarita, MD sent at 12/14/2015  9:00 PM EST ----- Please refer back to lipid clinic.  Change potassium to Kdur 63meq daily and recheck BMET in 1 week

## 2015-12-18 NOTE — Anesthesia Preprocedure Evaluation (Addendum)
Anesthesia Evaluation  Patient identified by MRN, date of birth, ID band Patient awake    Reviewed: Allergy & Precautions, NPO status , Patient's Chart, lab work & pertinent test results  History of Anesthesia Complications (+) PONV and history of anesthetic complications  Airway Mallampati: II  TM Distance: >3 FB Neck ROM: Full    Dental no notable dental hx. (+) Dental Advisory Given, Poor Dentition   Pulmonary sleep apnea ,  Sarcoidosis   Pulmonary exam normal breath sounds clear to auscultation       Cardiovascular hypertension, Pt. on medications Normal cardiovascular exam Rhythm:Regular Rate:Normal     Neuro/Psych negative neurological ROS  negative psych ROS   GI/Hepatic Neg liver ROS, GERD  Medicated and Controlled,  Endo/Other  diabetes  Renal/GU negative Renal ROS  negative genitourinary   Musculoskeletal  (+) Arthritis ,   Abdominal   Peds negative pediatric ROS (+)  Hematology negative hematology ROS (+)   Anesthesia Other Findings   Reproductive/Obstetrics negative OB ROS                           Anesthesia Physical Anesthesia Plan  ASA: III  Anesthesia Plan: MAC   Post-op Pain Management:    Induction: Intravenous  Airway Management Planned: Nasal Cannula  Additional Equipment:   Intra-op Plan:   Post-operative Plan:   Informed Consent: I have reviewed the patients History and Physical, chart, labs and discussed the procedure including the risks, benefits and alternatives for the proposed anesthesia with the patient or authorized representative who has indicated his/her understanding and acceptance.   Dental advisory given  Plan Discussed with: CRNA  Anesthesia Plan Comments:        Anesthesia Quick Evaluation

## 2015-12-19 ENCOUNTER — Ambulatory Visit (HOSPITAL_COMMUNITY)
Admission: RE | Admit: 2015-12-19 | Discharge: 2015-12-19 | Disposition: A | Discharge status: Home / Self Care | Payer: Medicare Other | Source: Ambulatory Visit | Attending: Gastroenterology | Admitting: Gastroenterology

## 2015-12-19 ENCOUNTER — Encounter (HOSPITAL_COMMUNITY)
Admission: RE | Disposition: A | Discharge status: Home / Self Care | Payer: Self-pay | Source: Ambulatory Visit | Attending: Gastroenterology

## 2015-12-19 ENCOUNTER — Ambulatory Visit (HOSPITAL_COMMUNITY): Payer: Medicare Other | Admitting: Anesthesiology

## 2015-12-19 ENCOUNTER — Encounter (HOSPITAL_COMMUNITY): Payer: Self-pay | Admitting: Certified Registered Nurse Anesthetist

## 2015-12-19 DIAGNOSIS — G4733 Obstructive sleep apnea (adult) (pediatric): Secondary | ICD-10-CM | POA: Insufficient documentation

## 2015-12-19 DIAGNOSIS — K219 Gastro-esophageal reflux disease without esophagitis: Secondary | ICD-10-CM | POA: Diagnosis not present

## 2015-12-19 DIAGNOSIS — E1143 Type 2 diabetes mellitus with diabetic autonomic (poly)neuropathy: Secondary | ICD-10-CM | POA: Insufficient documentation

## 2015-12-19 DIAGNOSIS — Z1211 Encounter for screening for malignant neoplasm of colon: Secondary | ICD-10-CM | POA: Insufficient documentation

## 2015-12-19 DIAGNOSIS — E1140 Type 2 diabetes mellitus with diabetic neuropathy, unspecified: Secondary | ICD-10-CM | POA: Diagnosis not present

## 2015-12-19 DIAGNOSIS — D869 Sarcoidosis, unspecified: Secondary | ICD-10-CM | POA: Diagnosis not present

## 2015-12-19 DIAGNOSIS — M1990 Unspecified osteoarthritis, unspecified site: Secondary | ICD-10-CM | POA: Insufficient documentation

## 2015-12-19 DIAGNOSIS — K3184 Gastroparesis: Secondary | ICD-10-CM | POA: Insufficient documentation

## 2015-12-19 DIAGNOSIS — H409 Unspecified glaucoma: Secondary | ICD-10-CM | POA: Insufficient documentation

## 2015-12-19 DIAGNOSIS — I10 Essential (primary) hypertension: Secondary | ICD-10-CM | POA: Insufficient documentation

## 2015-12-19 DIAGNOSIS — E119 Type 2 diabetes mellitus without complications: Secondary | ICD-10-CM | POA: Diagnosis not present

## 2015-12-19 DIAGNOSIS — E7800 Pure hypercholesterolemia, unspecified: Secondary | ICD-10-CM | POA: Diagnosis not present

## 2015-12-19 HISTORY — PX: COLONOSCOPY WITH PROPOFOL: SHX5780

## 2015-12-19 LAB — GLUCOSE, CAPILLARY: Glucose-Capillary: 98 mg/dL (ref 65–99)

## 2015-12-19 SURGERY — COLONOSCOPY WITH PROPOFOL
Anesthesia: Monitor Anesthesia Care

## 2015-12-19 MED ORDER — LACTATED RINGERS IV SOLN: INTRAVENOUS | Status: DC

## 2015-12-19 MED ORDER — PROPOFOL 10 MG/ML IV BOLUS
INTRAVENOUS | Status: AC
Start: 2015-12-19 — End: 2015-12-19
  Filled 2015-12-19: qty 20

## 2015-12-19 MED ORDER — ONDANSETRON HCL 4 MG/2ML IJ SOLN
INTRAMUSCULAR | Status: DC | PRN
  Administered 2015-12-19 (×2): 2 mg via INTRAVENOUS

## 2015-12-19 MED ORDER — SODIUM CHLORIDE 0.9 % IV SOLN
INTRAVENOUS | Status: DC
  Administered 2015-12-19: 500 mL via INTRAVENOUS
  Administered 2015-12-19: 09:00:00 via INTRAVENOUS

## 2015-12-19 MED ORDER — PROPOFOL 10 MG/ML IV BOLUS
INTRAVENOUS | Status: DC | PRN
  Administered 2015-12-19 (×4): 20 mg via INTRAVENOUS
  Administered 2015-12-19: 10 mg via INTRAVENOUS
  Administered 2015-12-19: 40 mg via INTRAVENOUS
  Administered 2015-12-19 (×2): 20 mg via INTRAVENOUS

## 2015-12-19 SURGICAL SUPPLY — 21 items

## 2015-12-19 NOTE — Anesthesia Postprocedure Evaluation (Signed)
Anesthesia Post Note  Patient: Sonya Goodwin  Procedure(s) Performed: Procedure(s) (LRB): COLONOSCOPY WITH PROPOFOL (N/A)  Patient location during evaluation: PACU Anesthesia Type: MAC Level of consciousness: awake and alert Pain management: pain level controlled Vital Signs Assessment: post-procedure vital signs reviewed and stable Respiratory status: spontaneous breathing, nonlabored ventilation, respiratory function stable and patient connected to nasal cannula oxygen Cardiovascular status: blood pressure returned to baseline and stable Postop Assessment: no signs of nausea or vomiting Anesthetic complications: no    Last Vitals:  Filed Vitals:   12/19/15 0930 12/19/15 0940  BP: 141/53 161/59  Pulse: 74 78  Temp:    Resp: 22 19    Last Pain: There were no vitals filed for this visit.               Audelia Knape JENNETTE

## 2015-12-19 NOTE — Op Note (Signed)
Procedure: Screening colonoscopy. Normal screening colonoscopy performed on 09/02/2001. Normal diagnostic colonoscopy performed on 04/18/2005  Endoscopist: Earle Gell  Premedication: Propofol administered by anesthesia  Procedure: The patient was placed in the left lateral decubitus position. Anal inspection and digital rectal exam were normal. The Pentax pediatric colonoscope was introduced into the rectum and advanced to the cecum. A normal-appearing appendiceal orifice and ileocecal valve were identified. Colonic preparation for the exam today was good. Withdrawal time was 10 minutes  Rectum. Normal. I was unable to retroflex the colonoscope in the rectum.  Sigmoid colon and descending colon. Normal  Splenic flexure. Normal  Transverse colon. Normal  Hepatic flexure. Normal  Ascending colon. Normal  Cecum and ileocecal valve. Normal  Assessment: Normal screening colonoscopy  Recommendation: I do not recommend performing a repeat screening colonoscopy

## 2015-12-19 NOTE — Transfer of Care (Signed)
Immediate Anesthesia Transfer of Care Note  Patient: Sonya Goodwin  Procedure(s) Performed: Procedure(s): COLONOSCOPY WITH PROPOFOL (N/A)  Patient Location: PACU and Endoscopy Unit  Anesthesia Type:MAC  Level of Consciousness: awake, alert , oriented, patient cooperative and responds to stimulation  Airway & Oxygen Therapy: Patient Spontanous Breathing and Patient connected to face mask oxygen  Post-op Assessment: Report given to RN, Post -op Vital signs reviewed and stable and Patient moving all extremities  Post vital signs: Reviewed  Last Vitals:  Filed Vitals:   12/19/15 0809  BP: 149/53  Temp: 36.5 C  Resp: 10    Complications: No apparent anesthesia complications

## 2015-12-19 NOTE — Discharge Instructions (Signed)

## 2015-12-19 NOTE — H&P (Signed)
  Procedure: Screening colonoscopy. 09/02/2001 normal screening colonoscopy performed. 04/18/2005 normal diagnostic colonoscopy performed.  History: The patient is a 74 year old female born 1941/06/25. She is scheduled to undergo a repeat screening colonoscopy today. She has chronic post herpetic neuralgia causing pain in the right lower quadrant of her abdomen.  Past medical history: Pulmonary sarcoidosis. Thyroid nodules. Hypertension. Type 2 diabetes mellitus complicated by diabetic gastroparesis and diabetic neuropathy. Hypercholesterolemia. Adrenal insufficiency. Gastroesophageal reflux. Femoral neck fracture in September 2012. Obstructive sleep apnea syndrome. Glaucoma. Osteoarthritis. Kidney stone. D&C performed in 1965. Cholecystectomy. Bronchoscopy. Left radius fracture requiring surgery in 2010. Hysterectomy. Arthroscopic knee surgery for meniscal tear performed in 2011. Lumbar laminectomy.  Medication allergies/medication side effects: Aspirin. Codeine. Fosamax. Zetia. Metoclopramide. Fentanyl patch. Nonsteroidal anti-inflammatory medication. Losartan. WelChol. Lovaza.  Exam: Patient is alert and lying comfortably on the endoscopy stretcher. Abdomen is soft and nontender to palpation. Cardiac exam reveals a regular rhythm. Lungs are clear to auscultation.  Plan: Proceed with screening colonoscopy

## 2015-12-20 ENCOUNTER — Encounter (HOSPITAL_COMMUNITY): Payer: Self-pay | Admitting: Gastroenterology

## 2015-12-27 ENCOUNTER — Other Ambulatory Visit (INDEPENDENT_AMBULATORY_CARE_PROVIDER_SITE_OTHER): Payer: Medicare Other | Admitting: *Deleted

## 2015-12-27 DIAGNOSIS — I10 Essential (primary) hypertension: Secondary | ICD-10-CM

## 2015-12-27 LAB — BASIC METABOLIC PANEL
BUN: 26 mg/dL — AB (ref 7–25)
CALCIUM: 8.9 mg/dL (ref 8.6–10.4)
CHLORIDE: 102 mmol/L (ref 98–110)
CO2: 24 mmol/L (ref 20–31)
CREATININE: 1.22 mg/dL — AB (ref 0.60–0.93)
GLUCOSE: 78 mg/dL (ref 65–99)
Potassium: 3.7 mmol/L (ref 3.5–5.3)
Sodium: 138 mmol/L (ref 135–146)

## 2015-12-27 NOTE — Addendum Note (Signed)
Addended by: Eulis Foster on: 12/27/2015 07:43 AM   Modules accepted: Orders

## 2015-12-29 DIAGNOSIS — H401131 Primary open-angle glaucoma, bilateral, mild stage: Secondary | ICD-10-CM | POA: Diagnosis not present

## 2015-12-29 DIAGNOSIS — Z961 Presence of intraocular lens: Secondary | ICD-10-CM | POA: Diagnosis not present

## 2016-01-02 DIAGNOSIS — N183 Chronic kidney disease, stage 3 (moderate): Secondary | ICD-10-CM | POA: Diagnosis not present

## 2016-01-09 DIAGNOSIS — E2740 Unspecified adrenocortical insufficiency: Secondary | ICD-10-CM | POA: Diagnosis not present

## 2016-01-09 DIAGNOSIS — E119 Type 2 diabetes mellitus without complications: Secondary | ICD-10-CM | POA: Diagnosis not present

## 2016-01-09 DIAGNOSIS — N183 Chronic kidney disease, stage 3 (moderate): Secondary | ICD-10-CM | POA: Diagnosis not present

## 2016-01-09 DIAGNOSIS — I129 Hypertensive chronic kidney disease with stage 1 through stage 4 chronic kidney disease, or unspecified chronic kidney disease: Secondary | ICD-10-CM | POA: Diagnosis not present

## 2016-03-28 DIAGNOSIS — D485 Neoplasm of uncertain behavior of skin: Secondary | ICD-10-CM | POA: Diagnosis not present

## 2016-03-28 DIAGNOSIS — D225 Melanocytic nevi of trunk: Secondary | ICD-10-CM | POA: Diagnosis not present

## 2016-03-28 DIAGNOSIS — Z85828 Personal history of other malignant neoplasm of skin: Secondary | ICD-10-CM | POA: Diagnosis not present

## 2016-03-28 DIAGNOSIS — Z872 Personal history of diseases of the skin and subcutaneous tissue: Secondary | ICD-10-CM | POA: Diagnosis not present

## 2016-03-28 DIAGNOSIS — D235 Other benign neoplasm of skin of trunk: Secondary | ICD-10-CM | POA: Diagnosis not present

## 2016-03-28 DIAGNOSIS — L821 Other seborrheic keratosis: Secondary | ICD-10-CM | POA: Diagnosis not present

## 2016-03-28 DIAGNOSIS — L570 Actinic keratosis: Secondary | ICD-10-CM | POA: Diagnosis not present

## 2016-03-28 DIAGNOSIS — Z23 Encounter for immunization: Secondary | ICD-10-CM | POA: Diagnosis not present

## 2016-04-17 DIAGNOSIS — H409 Unspecified glaucoma: Secondary | ICD-10-CM | POA: Diagnosis not present

## 2016-04-17 DIAGNOSIS — E2740 Unspecified adrenocortical insufficiency: Secondary | ICD-10-CM | POA: Diagnosis not present

## 2016-04-17 DIAGNOSIS — G2581 Restless legs syndrome: Secondary | ICD-10-CM | POA: Diagnosis not present

## 2016-04-17 DIAGNOSIS — I10 Essential (primary) hypertension: Secondary | ICD-10-CM | POA: Diagnosis not present

## 2016-04-17 DIAGNOSIS — Z23 Encounter for immunization: Secondary | ICD-10-CM | POA: Diagnosis not present

## 2016-04-17 DIAGNOSIS — E559 Vitamin D deficiency, unspecified: Secondary | ICD-10-CM | POA: Diagnosis not present

## 2016-04-17 DIAGNOSIS — D860 Sarcoidosis of lung: Secondary | ICD-10-CM | POA: Diagnosis not present

## 2016-04-17 DIAGNOSIS — N183 Chronic kidney disease, stage 3 (moderate): Secondary | ICD-10-CM | POA: Diagnosis not present

## 2016-04-17 DIAGNOSIS — M8580 Other specified disorders of bone density and structure, unspecified site: Secondary | ICD-10-CM | POA: Diagnosis not present

## 2016-04-17 DIAGNOSIS — E782 Mixed hyperlipidemia: Secondary | ICD-10-CM | POA: Diagnosis not present

## 2016-04-17 DIAGNOSIS — R197 Diarrhea, unspecified: Secondary | ICD-10-CM | POA: Diagnosis not present

## 2016-04-17 DIAGNOSIS — R8290 Unspecified abnormal findings in urine: Secondary | ICD-10-CM | POA: Diagnosis not present

## 2016-05-03 DIAGNOSIS — N3001 Acute cystitis with hematuria: Secondary | ICD-10-CM | POA: Diagnosis not present

## 2016-05-06 DIAGNOSIS — R197 Diarrhea, unspecified: Secondary | ICD-10-CM | POA: Diagnosis not present

## 2016-05-07 DIAGNOSIS — N183 Chronic kidney disease, stage 3 (moderate): Secondary | ICD-10-CM | POA: Diagnosis not present

## 2016-05-10 DIAGNOSIS — E119 Type 2 diabetes mellitus without complications: Secondary | ICD-10-CM | POA: Diagnosis not present

## 2016-05-10 DIAGNOSIS — N183 Chronic kidney disease, stage 3 (moderate): Secondary | ICD-10-CM | POA: Diagnosis not present

## 2016-05-10 DIAGNOSIS — I129 Hypertensive chronic kidney disease with stage 1 through stage 4 chronic kidney disease, or unspecified chronic kidney disease: Secondary | ICD-10-CM | POA: Diagnosis not present

## 2016-05-14 DIAGNOSIS — M859 Disorder of bone density and structure, unspecified: Secondary | ICD-10-CM | POA: Diagnosis not present

## 2016-06-06 DIAGNOSIS — H16223 Keratoconjunctivitis sicca, not specified as Sjogren's, bilateral: Secondary | ICD-10-CM | POA: Diagnosis not present

## 2016-06-06 DIAGNOSIS — H409 Unspecified glaucoma: Secondary | ICD-10-CM | POA: Diagnosis not present

## 2016-06-06 DIAGNOSIS — Z961 Presence of intraocular lens: Secondary | ICD-10-CM | POA: Diagnosis not present

## 2016-06-06 DIAGNOSIS — E119 Type 2 diabetes mellitus without complications: Secondary | ICD-10-CM | POA: Diagnosis not present

## 2016-06-18 DIAGNOSIS — D860 Sarcoidosis of lung: Secondary | ICD-10-CM | POA: Diagnosis not present

## 2016-08-14 DIAGNOSIS — Z800 Family history of malignant neoplasm of digestive organs: Secondary | ICD-10-CM | POA: Diagnosis not present

## 2016-08-14 DIAGNOSIS — Z806 Family history of leukemia: Secondary | ICD-10-CM | POA: Diagnosis not present

## 2016-09-03 DIAGNOSIS — N183 Chronic kidney disease, stage 3 (moderate): Secondary | ICD-10-CM | POA: Diagnosis not present

## 2016-09-10 DIAGNOSIS — E119 Type 2 diabetes mellitus without complications: Secondary | ICD-10-CM | POA: Diagnosis not present

## 2016-09-10 DIAGNOSIS — I129 Hypertensive chronic kidney disease with stage 1 through stage 4 chronic kidney disease, or unspecified chronic kidney disease: Secondary | ICD-10-CM | POA: Diagnosis not present

## 2016-09-10 DIAGNOSIS — N183 Chronic kidney disease, stage 3 (moderate): Secondary | ICD-10-CM | POA: Diagnosis not present

## 2016-10-31 DIAGNOSIS — E559 Vitamin D deficiency, unspecified: Secondary | ICD-10-CM | POA: Diagnosis not present

## 2016-10-31 DIAGNOSIS — E782 Mixed hyperlipidemia: Secondary | ICD-10-CM | POA: Diagnosis not present

## 2016-10-31 DIAGNOSIS — S80812A Abrasion, left lower leg, initial encounter: Secondary | ICD-10-CM | POA: Diagnosis not present

## 2016-10-31 DIAGNOSIS — E2740 Unspecified adrenocortical insufficiency: Secondary | ICD-10-CM | POA: Diagnosis not present

## 2016-10-31 DIAGNOSIS — W5503XA Scratched by cat, initial encounter: Secondary | ICD-10-CM | POA: Diagnosis not present

## 2016-10-31 DIAGNOSIS — S90512A Abrasion, left ankle, initial encounter: Secondary | ICD-10-CM | POA: Diagnosis not present

## 2016-10-31 DIAGNOSIS — E1122 Type 2 diabetes mellitus with diabetic chronic kidney disease: Secondary | ICD-10-CM | POA: Diagnosis not present

## 2016-10-31 DIAGNOSIS — N183 Chronic kidney disease, stage 3 (moderate): Secondary | ICD-10-CM | POA: Diagnosis not present

## 2016-10-31 DIAGNOSIS — H409 Unspecified glaucoma: Secondary | ICD-10-CM | POA: Diagnosis not present

## 2016-10-31 DIAGNOSIS — I10 Essential (primary) hypertension: Secondary | ICD-10-CM | POA: Diagnosis not present

## 2016-10-31 DIAGNOSIS — Z23 Encounter for immunization: Secondary | ICD-10-CM | POA: Diagnosis not present

## 2016-10-31 DIAGNOSIS — D860 Sarcoidosis of lung: Secondary | ICD-10-CM | POA: Diagnosis not present

## 2016-10-31 DIAGNOSIS — L03116 Cellulitis of left lower limb: Secondary | ICD-10-CM | POA: Diagnosis not present

## 2016-11-12 DIAGNOSIS — M25562 Pain in left knee: Secondary | ICD-10-CM | POA: Diagnosis not present

## 2016-11-12 DIAGNOSIS — M170 Bilateral primary osteoarthritis of knee: Secondary | ICD-10-CM | POA: Diagnosis not present

## 2016-11-12 DIAGNOSIS — M25561 Pain in right knee: Secondary | ICD-10-CM | POA: Diagnosis not present

## 2016-12-11 DIAGNOSIS — M8588 Other specified disorders of bone density and structure, other site: Secondary | ICD-10-CM | POA: Diagnosis not present

## 2016-12-12 DIAGNOSIS — E119 Type 2 diabetes mellitus without complications: Secondary | ICD-10-CM | POA: Diagnosis not present

## 2016-12-12 DIAGNOSIS — H409 Unspecified glaucoma: Secondary | ICD-10-CM | POA: Diagnosis not present

## 2016-12-12 DIAGNOSIS — H16223 Keratoconjunctivitis sicca, not specified as Sjogren's, bilateral: Secondary | ICD-10-CM | POA: Diagnosis not present

## 2016-12-12 DIAGNOSIS — Z961 Presence of intraocular lens: Secondary | ICD-10-CM | POA: Diagnosis not present

## 2016-12-31 DIAGNOSIS — M25562 Pain in left knee: Secondary | ICD-10-CM | POA: Diagnosis not present

## 2016-12-31 DIAGNOSIS — M25561 Pain in right knee: Secondary | ICD-10-CM | POA: Diagnosis not present

## 2016-12-31 DIAGNOSIS — M1712 Unilateral primary osteoarthritis, left knee: Secondary | ICD-10-CM | POA: Diagnosis not present

## 2016-12-31 DIAGNOSIS — M170 Bilateral primary osteoarthritis of knee: Secondary | ICD-10-CM | POA: Diagnosis not present

## 2017-01-02 DIAGNOSIS — H401130 Primary open-angle glaucoma, bilateral, stage unspecified: Secondary | ICD-10-CM | POA: Diagnosis not present

## 2017-01-03 DIAGNOSIS — N183 Chronic kidney disease, stage 3 (moderate): Secondary | ICD-10-CM | POA: Diagnosis not present

## 2017-01-08 DIAGNOSIS — M25562 Pain in left knee: Secondary | ICD-10-CM | POA: Diagnosis not present

## 2017-01-08 DIAGNOSIS — M25462 Effusion, left knee: Secondary | ICD-10-CM | POA: Diagnosis not present

## 2017-01-08 DIAGNOSIS — M1712 Unilateral primary osteoarthritis, left knee: Secondary | ICD-10-CM | POA: Diagnosis not present

## 2017-01-10 DIAGNOSIS — N183 Chronic kidney disease, stage 3 (moderate): Secondary | ICD-10-CM | POA: Diagnosis not present

## 2017-01-10 DIAGNOSIS — I129 Hypertensive chronic kidney disease with stage 1 through stage 4 chronic kidney disease, or unspecified chronic kidney disease: Secondary | ICD-10-CM | POA: Diagnosis not present

## 2017-01-10 DIAGNOSIS — E119 Type 2 diabetes mellitus without complications: Secondary | ICD-10-CM | POA: Diagnosis not present

## 2017-01-17 DIAGNOSIS — M1712 Unilateral primary osteoarthritis, left knee: Secondary | ICD-10-CM | POA: Diagnosis not present

## 2017-01-17 DIAGNOSIS — M25562 Pain in left knee: Secondary | ICD-10-CM | POA: Diagnosis not present

## 2017-01-23 DIAGNOSIS — M25562 Pain in left knee: Secondary | ICD-10-CM | POA: Diagnosis not present

## 2017-01-23 DIAGNOSIS — M1712 Unilateral primary osteoarthritis, left knee: Secondary | ICD-10-CM | POA: Diagnosis not present

## 2017-02-03 DIAGNOSIS — M25562 Pain in left knee: Secondary | ICD-10-CM | POA: Diagnosis not present

## 2017-02-03 DIAGNOSIS — M1712 Unilateral primary osteoarthritis, left knee: Secondary | ICD-10-CM | POA: Diagnosis not present

## 2017-02-05 DIAGNOSIS — R05 Cough: Secondary | ICD-10-CM | POA: Diagnosis not present

## 2017-02-19 DIAGNOSIS — R1311 Dysphagia, oral phase: Secondary | ICD-10-CM | POA: Diagnosis not present

## 2017-02-21 DIAGNOSIS — K219 Gastro-esophageal reflux disease without esophagitis: Secondary | ICD-10-CM | POA: Diagnosis not present

## 2017-02-21 DIAGNOSIS — H905 Unspecified sensorineural hearing loss: Secondary | ICD-10-CM | POA: Diagnosis not present

## 2017-02-21 DIAGNOSIS — R1310 Dysphagia, unspecified: Secondary | ICD-10-CM | POA: Diagnosis not present

## 2017-02-21 DIAGNOSIS — H6121 Impacted cerumen, right ear: Secondary | ICD-10-CM | POA: Diagnosis not present

## 2017-04-03 DIAGNOSIS — H401130 Primary open-angle glaucoma, bilateral, stage unspecified: Secondary | ICD-10-CM | POA: Diagnosis not present

## 2017-05-15 DIAGNOSIS — M1712 Unilateral primary osteoarthritis, left knee: Secondary | ICD-10-CM | POA: Diagnosis not present

## 2017-05-15 DIAGNOSIS — Z789 Other specified health status: Secondary | ICD-10-CM | POA: Diagnosis not present

## 2017-05-15 DIAGNOSIS — M25562 Pain in left knee: Secondary | ICD-10-CM | POA: Diagnosis not present

## 2017-05-29 DIAGNOSIS — H04123 Dry eye syndrome of bilateral lacrimal glands: Secondary | ICD-10-CM | POA: Diagnosis not present

## 2017-05-29 DIAGNOSIS — H401131 Primary open-angle glaucoma, bilateral, mild stage: Secondary | ICD-10-CM | POA: Diagnosis not present

## 2017-06-05 DIAGNOSIS — N183 Chronic kidney disease, stage 3 (moderate): Secondary | ICD-10-CM | POA: Diagnosis not present

## 2017-06-10 DIAGNOSIS — N183 Chronic kidney disease, stage 3 (moderate): Secondary | ICD-10-CM | POA: Diagnosis not present

## 2017-06-10 DIAGNOSIS — E876 Hypokalemia: Secondary | ICD-10-CM | POA: Diagnosis not present

## 2017-06-10 DIAGNOSIS — E119 Type 2 diabetes mellitus without complications: Secondary | ICD-10-CM | POA: Diagnosis not present

## 2017-06-10 DIAGNOSIS — I129 Hypertensive chronic kidney disease with stage 1 through stage 4 chronic kidney disease, or unspecified chronic kidney disease: Secondary | ICD-10-CM | POA: Diagnosis not present

## 2017-06-26 DIAGNOSIS — H04123 Dry eye syndrome of bilateral lacrimal glands: Secondary | ICD-10-CM | POA: Diagnosis not present

## 2017-06-26 DIAGNOSIS — H401131 Primary open-angle glaucoma, bilateral, mild stage: Secondary | ICD-10-CM | POA: Diagnosis not present

## 2017-08-29 DIAGNOSIS — R0789 Other chest pain: Secondary | ICD-10-CM | POA: Diagnosis not present

## 2017-08-29 DIAGNOSIS — N183 Chronic kidney disease, stage 3 (moderate): Secondary | ICD-10-CM | POA: Diagnosis not present

## 2017-08-29 DIAGNOSIS — E559 Vitamin D deficiency, unspecified: Secondary | ICD-10-CM | POA: Diagnosis not present

## 2017-08-29 DIAGNOSIS — Z23 Encounter for immunization: Secondary | ICD-10-CM | POA: Diagnosis not present

## 2017-08-29 DIAGNOSIS — M8580 Other specified disorders of bone density and structure, unspecified site: Secondary | ICD-10-CM | POA: Diagnosis not present

## 2017-08-29 DIAGNOSIS — E1122 Type 2 diabetes mellitus with diabetic chronic kidney disease: Secondary | ICD-10-CM | POA: Diagnosis not present

## 2017-08-29 DIAGNOSIS — E2740 Unspecified adrenocortical insufficiency: Secondary | ICD-10-CM | POA: Diagnosis not present

## 2017-08-29 DIAGNOSIS — D860 Sarcoidosis of lung: Secondary | ICD-10-CM | POA: Diagnosis not present

## 2017-08-29 DIAGNOSIS — H409 Unspecified glaucoma: Secondary | ICD-10-CM | POA: Diagnosis not present

## 2017-08-29 DIAGNOSIS — I10 Essential (primary) hypertension: Secondary | ICD-10-CM | POA: Diagnosis not present

## 2017-08-29 DIAGNOSIS — E782 Mixed hyperlipidemia: Secondary | ICD-10-CM | POA: Diagnosis not present

## 2017-08-29 DIAGNOSIS — K219 Gastro-esophageal reflux disease without esophagitis: Secondary | ICD-10-CM | POA: Diagnosis not present

## 2017-10-03 ENCOUNTER — Other Ambulatory Visit: Payer: Self-pay | Admitting: Family Medicine

## 2017-10-03 DIAGNOSIS — Z1231 Encounter for screening mammogram for malignant neoplasm of breast: Secondary | ICD-10-CM

## 2017-10-06 DIAGNOSIS — N183 Chronic kidney disease, stage 3 (moderate): Secondary | ICD-10-CM | POA: Diagnosis not present

## 2017-10-10 DIAGNOSIS — N179 Acute kidney failure, unspecified: Secondary | ICD-10-CM | POA: Diagnosis not present

## 2017-10-10 DIAGNOSIS — I129 Hypertensive chronic kidney disease with stage 1 through stage 4 chronic kidney disease, or unspecified chronic kidney disease: Secondary | ICD-10-CM | POA: Diagnosis not present

## 2017-10-10 DIAGNOSIS — N183 Chronic kidney disease, stage 3 (moderate): Secondary | ICD-10-CM | POA: Diagnosis not present

## 2017-10-24 ENCOUNTER — Ambulatory Visit: Payer: Medicare Other

## 2017-11-10 DIAGNOSIS — N179 Acute kidney failure, unspecified: Secondary | ICD-10-CM | POA: Diagnosis not present

## 2017-11-11 ENCOUNTER — Ambulatory Visit
Admission: RE | Admit: 2017-11-11 | Discharge: 2017-11-11 | Disposition: A | Discharge status: Home / Self Care | Payer: Medicare Other | Source: Ambulatory Visit | Attending: Family Medicine | Admitting: Family Medicine

## 2017-11-11 DIAGNOSIS — Z1231 Encounter for screening mammogram for malignant neoplasm of breast: Secondary | ICD-10-CM | POA: Diagnosis not present

## 2017-11-13 DIAGNOSIS — N179 Acute kidney failure, unspecified: Secondary | ICD-10-CM | POA: Diagnosis not present

## 2017-11-13 DIAGNOSIS — I129 Hypertensive chronic kidney disease with stage 1 through stage 4 chronic kidney disease, or unspecified chronic kidney disease: Secondary | ICD-10-CM | POA: Diagnosis not present

## 2017-11-13 DIAGNOSIS — N183 Chronic kidney disease, stage 3 (moderate): Secondary | ICD-10-CM | POA: Diagnosis not present

## 2017-11-14 DIAGNOSIS — H04123 Dry eye syndrome of bilateral lacrimal glands: Secondary | ICD-10-CM | POA: Diagnosis not present

## 2017-11-14 DIAGNOSIS — H401131 Primary open-angle glaucoma, bilateral, mild stage: Secondary | ICD-10-CM | POA: Diagnosis not present

## 2017-12-12 DIAGNOSIS — N179 Acute kidney failure, unspecified: Secondary | ICD-10-CM | POA: Diagnosis not present

## 2017-12-16 DIAGNOSIS — I129 Hypertensive chronic kidney disease with stage 1 through stage 4 chronic kidney disease, or unspecified chronic kidney disease: Secondary | ICD-10-CM | POA: Diagnosis not present

## 2017-12-16 DIAGNOSIS — N179 Acute kidney failure, unspecified: Secondary | ICD-10-CM | POA: Diagnosis not present

## 2017-12-16 DIAGNOSIS — E876 Hypokalemia: Secondary | ICD-10-CM | POA: Diagnosis not present

## 2017-12-16 DIAGNOSIS — N183 Chronic kidney disease, stage 3 (moderate): Secondary | ICD-10-CM | POA: Diagnosis not present

## 2018-02-27 DIAGNOSIS — I10 Essential (primary) hypertension: Secondary | ICD-10-CM | POA: Diagnosis not present

## 2018-02-27 DIAGNOSIS — G2581 Restless legs syndrome: Secondary | ICD-10-CM | POA: Diagnosis not present

## 2018-02-27 DIAGNOSIS — N183 Chronic kidney disease, stage 3 (moderate): Secondary | ICD-10-CM | POA: Diagnosis not present

## 2018-02-27 DIAGNOSIS — M1990 Unspecified osteoarthritis, unspecified site: Secondary | ICD-10-CM | POA: Diagnosis not present

## 2018-02-27 DIAGNOSIS — M8580 Other specified disorders of bone density and structure, unspecified site: Secondary | ICD-10-CM | POA: Diagnosis not present

## 2018-02-27 DIAGNOSIS — E1122 Type 2 diabetes mellitus with diabetic chronic kidney disease: Secondary | ICD-10-CM | POA: Diagnosis not present

## 2018-02-27 DIAGNOSIS — R8290 Unspecified abnormal findings in urine: Secondary | ICD-10-CM | POA: Diagnosis not present

## 2018-02-27 DIAGNOSIS — E782 Mixed hyperlipidemia: Secondary | ICD-10-CM | POA: Diagnosis not present

## 2018-02-27 DIAGNOSIS — G720 Drug-induced myopathy: Secondary | ICD-10-CM | POA: Diagnosis not present

## 2018-03-12 DIAGNOSIS — N183 Chronic kidney disease, stage 3 (moderate): Secondary | ICD-10-CM | POA: Diagnosis not present

## 2018-03-17 DIAGNOSIS — E119 Type 2 diabetes mellitus without complications: Secondary | ICD-10-CM | POA: Diagnosis not present

## 2018-03-17 DIAGNOSIS — I129 Hypertensive chronic kidney disease with stage 1 through stage 4 chronic kidney disease, or unspecified chronic kidney disease: Secondary | ICD-10-CM | POA: Diagnosis not present

## 2018-03-17 DIAGNOSIS — N183 Chronic kidney disease, stage 3 (moderate): Secondary | ICD-10-CM | POA: Diagnosis not present

## 2018-03-17 DIAGNOSIS — E876 Hypokalemia: Secondary | ICD-10-CM | POA: Diagnosis not present

## 2018-05-08 DIAGNOSIS — J069 Acute upper respiratory infection, unspecified: Secondary | ICD-10-CM | POA: Diagnosis not present

## 2018-05-08 DIAGNOSIS — R05 Cough: Secondary | ICD-10-CM | POA: Diagnosis not present

## 2018-05-08 DIAGNOSIS — R062 Wheezing: Secondary | ICD-10-CM | POA: Diagnosis not present

## 2018-05-25 DIAGNOSIS — H04123 Dry eye syndrome of bilateral lacrimal glands: Secondary | ICD-10-CM | POA: Diagnosis not present

## 2018-05-25 DIAGNOSIS — E119 Type 2 diabetes mellitus without complications: Secondary | ICD-10-CM | POA: Diagnosis not present

## 2018-05-25 DIAGNOSIS — H40013 Open angle with borderline findings, low risk, bilateral: Secondary | ICD-10-CM | POA: Diagnosis not present

## 2018-07-08 DIAGNOSIS — R3915 Urgency of urination: Secondary | ICD-10-CM | POA: Diagnosis not present

## 2018-07-08 DIAGNOSIS — R8290 Unspecified abnormal findings in urine: Secondary | ICD-10-CM | POA: Diagnosis not present

## 2018-08-07 DIAGNOSIS — R8290 Unspecified abnormal findings in urine: Secondary | ICD-10-CM | POA: Diagnosis not present

## 2018-08-07 DIAGNOSIS — N3000 Acute cystitis without hematuria: Secondary | ICD-10-CM | POA: Diagnosis not present

## 2018-08-18 DIAGNOSIS — N183 Chronic kidney disease, stage 3 (moderate): Secondary | ICD-10-CM | POA: Diagnosis not present

## 2018-08-21 DIAGNOSIS — E119 Type 2 diabetes mellitus without complications: Secondary | ICD-10-CM | POA: Diagnosis not present

## 2018-08-21 DIAGNOSIS — S90424A Blister (nonthermal), right lesser toe(s), initial encounter: Secondary | ICD-10-CM | POA: Diagnosis not present

## 2018-08-21 DIAGNOSIS — N183 Chronic kidney disease, stage 3 (moderate): Secondary | ICD-10-CM | POA: Diagnosis not present

## 2018-08-21 DIAGNOSIS — E876 Hypokalemia: Secondary | ICD-10-CM | POA: Diagnosis not present

## 2018-08-21 DIAGNOSIS — I129 Hypertensive chronic kidney disease with stage 1 through stage 4 chronic kidney disease, or unspecified chronic kidney disease: Secondary | ICD-10-CM | POA: Diagnosis not present

## 2018-08-30 DIAGNOSIS — T25221A Burn of second degree of right foot, initial encounter: Secondary | ICD-10-CM | POA: Diagnosis not present

## 2018-10-13 ENCOUNTER — Other Ambulatory Visit: Payer: Self-pay | Admitting: Family Medicine

## 2018-10-13 DIAGNOSIS — G4700 Insomnia, unspecified: Secondary | ICD-10-CM | POA: Diagnosis not present

## 2018-10-13 DIAGNOSIS — G2581 Restless legs syndrome: Secondary | ICD-10-CM | POA: Diagnosis not present

## 2018-10-13 DIAGNOSIS — Z79891 Long term (current) use of opiate analgesic: Secondary | ICD-10-CM | POA: Diagnosis not present

## 2018-10-13 DIAGNOSIS — Z7722 Contact with and (suspected) exposure to environmental tobacco smoke (acute) (chronic): Secondary | ICD-10-CM | POA: Diagnosis not present

## 2018-10-13 DIAGNOSIS — D869 Sarcoidosis, unspecified: Secondary | ICD-10-CM | POA: Diagnosis not present

## 2018-10-13 DIAGNOSIS — R269 Unspecified abnormalities of gait and mobility: Secondary | ICD-10-CM | POA: Diagnosis not present

## 2018-10-13 DIAGNOSIS — H409 Unspecified glaucoma: Secondary | ICD-10-CM | POA: Diagnosis not present

## 2018-10-13 DIAGNOSIS — I10 Essential (primary) hypertension: Secondary | ICD-10-CM | POA: Diagnosis not present

## 2018-10-13 DIAGNOSIS — E1142 Type 2 diabetes mellitus with diabetic polyneuropathy: Secondary | ICD-10-CM | POA: Diagnosis not present

## 2018-10-13 DIAGNOSIS — G8929 Other chronic pain: Secondary | ICD-10-CM | POA: Diagnosis not present

## 2018-10-13 DIAGNOSIS — Z1231 Encounter for screening mammogram for malignant neoplasm of breast: Secondary | ICD-10-CM

## 2018-10-14 DIAGNOSIS — I10 Essential (primary) hypertension: Secondary | ICD-10-CM | POA: Diagnosis not present

## 2018-10-14 DIAGNOSIS — E1142 Type 2 diabetes mellitus with diabetic polyneuropathy: Secondary | ICD-10-CM | POA: Diagnosis not present

## 2018-10-14 DIAGNOSIS — M8580 Other specified disorders of bone density and structure, unspecified site: Secondary | ICD-10-CM | POA: Diagnosis not present

## 2018-10-14 DIAGNOSIS — E1122 Type 2 diabetes mellitus with diabetic chronic kidney disease: Secondary | ICD-10-CM | POA: Diagnosis not present

## 2018-10-14 DIAGNOSIS — K219 Gastro-esophageal reflux disease without esophagitis: Secondary | ICD-10-CM | POA: Diagnosis not present

## 2018-10-14 DIAGNOSIS — E2740 Unspecified adrenocortical insufficiency: Secondary | ICD-10-CM | POA: Diagnosis not present

## 2018-10-14 DIAGNOSIS — H409 Unspecified glaucoma: Secondary | ICD-10-CM | POA: Diagnosis not present

## 2018-10-14 DIAGNOSIS — E782 Mixed hyperlipidemia: Secondary | ICD-10-CM | POA: Diagnosis not present

## 2018-10-14 DIAGNOSIS — G2581 Restless legs syndrome: Secondary | ICD-10-CM | POA: Diagnosis not present

## 2018-10-14 DIAGNOSIS — Z23 Encounter for immunization: Secondary | ICD-10-CM | POA: Diagnosis not present

## 2018-10-14 DIAGNOSIS — R8290 Unspecified abnormal findings in urine: Secondary | ICD-10-CM | POA: Diagnosis not present

## 2018-10-15 ENCOUNTER — Other Ambulatory Visit: Payer: Self-pay | Admitting: Family Medicine

## 2018-10-15 DIAGNOSIS — M8580 Other specified disorders of bone density and structure, unspecified site: Secondary | ICD-10-CM

## 2018-10-18 DIAGNOSIS — R69 Illness, unspecified: Secondary | ICD-10-CM | POA: Diagnosis not present

## 2018-11-23 ENCOUNTER — Ambulatory Visit
Admission: RE | Admit: 2018-11-23 | Discharge: 2018-11-23 | Disposition: A | Discharge status: Home / Self Care | Payer: Medicare HMO | Source: Ambulatory Visit | Attending: Family Medicine | Admitting: Family Medicine

## 2018-11-23 DIAGNOSIS — Z1231 Encounter for screening mammogram for malignant neoplasm of breast: Secondary | ICD-10-CM | POA: Diagnosis not present

## 2018-12-07 DIAGNOSIS — E119 Type 2 diabetes mellitus without complications: Secondary | ICD-10-CM | POA: Diagnosis not present

## 2018-12-07 DIAGNOSIS — H04123 Dry eye syndrome of bilateral lacrimal glands: Secondary | ICD-10-CM | POA: Diagnosis not present

## 2018-12-07 DIAGNOSIS — H40013 Open angle with borderline findings, low risk, bilateral: Secondary | ICD-10-CM | POA: Diagnosis not present

## 2018-12-15 ENCOUNTER — Ambulatory Visit
Admission: RE | Admit: 2018-12-15 | Discharge: 2018-12-15 | Disposition: A | Discharge status: Home / Self Care | Payer: Medicare Other | Source: Ambulatory Visit | Attending: Family Medicine | Admitting: Family Medicine

## 2018-12-15 DIAGNOSIS — M8580 Other specified disorders of bone density and structure, unspecified site: Secondary | ICD-10-CM

## 2018-12-15 DIAGNOSIS — M85831 Other specified disorders of bone density and structure, right forearm: Secondary | ICD-10-CM | POA: Diagnosis not present

## 2018-12-15 DIAGNOSIS — M810 Age-related osteoporosis without current pathological fracture: Secondary | ICD-10-CM | POA: Diagnosis not present

## 2019-02-19 DIAGNOSIS — N183 Chronic kidney disease, stage 3 (moderate): Secondary | ICD-10-CM | POA: Diagnosis not present

## 2019-02-23 DIAGNOSIS — E876 Hypokalemia: Secondary | ICD-10-CM | POA: Diagnosis not present

## 2019-02-23 DIAGNOSIS — N183 Chronic kidney disease, stage 3 (moderate): Secondary | ICD-10-CM | POA: Diagnosis not present

## 2019-02-23 DIAGNOSIS — I129 Hypertensive chronic kidney disease with stage 1 through stage 4 chronic kidney disease, or unspecified chronic kidney disease: Secondary | ICD-10-CM | POA: Diagnosis not present

## 2019-04-16 DIAGNOSIS — K219 Gastro-esophageal reflux disease without esophagitis: Secondary | ICD-10-CM | POA: Diagnosis not present

## 2019-04-16 DIAGNOSIS — E1122 Type 2 diabetes mellitus with diabetic chronic kidney disease: Secondary | ICD-10-CM | POA: Diagnosis not present

## 2019-04-16 DIAGNOSIS — G2581 Restless legs syndrome: Secondary | ICD-10-CM | POA: Diagnosis not present

## 2019-04-16 DIAGNOSIS — E1142 Type 2 diabetes mellitus with diabetic polyneuropathy: Secondary | ICD-10-CM | POA: Diagnosis not present

## 2019-04-16 DIAGNOSIS — E782 Mixed hyperlipidemia: Secondary | ICD-10-CM | POA: Diagnosis not present

## 2019-04-16 DIAGNOSIS — I10 Essential (primary) hypertension: Secondary | ICD-10-CM | POA: Diagnosis not present

## 2019-04-16 DIAGNOSIS — M810 Age-related osteoporosis without current pathological fracture: Secondary | ICD-10-CM | POA: Diagnosis not present

## 2019-04-16 DIAGNOSIS — E2740 Unspecified adrenocortical insufficiency: Secondary | ICD-10-CM | POA: Diagnosis not present

## 2019-04-16 DIAGNOSIS — H409 Unspecified glaucoma: Secondary | ICD-10-CM | POA: Diagnosis not present

## 2019-04-23 DIAGNOSIS — E2740 Unspecified adrenocortical insufficiency: Secondary | ICD-10-CM | POA: Diagnosis not present

## 2019-04-23 DIAGNOSIS — E782 Mixed hyperlipidemia: Secondary | ICD-10-CM | POA: Diagnosis not present

## 2019-04-23 DIAGNOSIS — E1142 Type 2 diabetes mellitus with diabetic polyneuropathy: Secondary | ICD-10-CM | POA: Diagnosis not present

## 2019-04-23 DIAGNOSIS — H409 Unspecified glaucoma: Secondary | ICD-10-CM | POA: Diagnosis not present

## 2019-04-23 DIAGNOSIS — G2581 Restless legs syndrome: Secondary | ICD-10-CM | POA: Diagnosis not present

## 2019-04-23 DIAGNOSIS — E1122 Type 2 diabetes mellitus with diabetic chronic kidney disease: Secondary | ICD-10-CM | POA: Diagnosis not present

## 2019-04-23 DIAGNOSIS — I10 Essential (primary) hypertension: Secondary | ICD-10-CM | POA: Diagnosis not present

## 2019-04-23 DIAGNOSIS — K219 Gastro-esophageal reflux disease without esophagitis: Secondary | ICD-10-CM | POA: Diagnosis not present

## 2019-04-27 DIAGNOSIS — M8580 Other specified disorders of bone density and structure, unspecified site: Secondary | ICD-10-CM | POA: Diagnosis not present

## 2019-04-27 DIAGNOSIS — H409 Unspecified glaucoma: Secondary | ICD-10-CM | POA: Diagnosis not present

## 2019-04-27 DIAGNOSIS — M810 Age-related osteoporosis without current pathological fracture: Secondary | ICD-10-CM | POA: Diagnosis not present

## 2019-04-27 DIAGNOSIS — I10 Essential (primary) hypertension: Secondary | ICD-10-CM | POA: Diagnosis not present

## 2019-04-27 DIAGNOSIS — E782 Mixed hyperlipidemia: Secondary | ICD-10-CM | POA: Diagnosis not present

## 2019-04-27 DIAGNOSIS — E1122 Type 2 diabetes mellitus with diabetic chronic kidney disease: Secondary | ICD-10-CM | POA: Diagnosis not present

## 2019-04-27 DIAGNOSIS — E1142 Type 2 diabetes mellitus with diabetic polyneuropathy: Secondary | ICD-10-CM | POA: Diagnosis not present

## 2019-04-27 DIAGNOSIS — M1990 Unspecified osteoarthritis, unspecified site: Secondary | ICD-10-CM | POA: Diagnosis not present

## 2019-04-27 DIAGNOSIS — N183 Chronic kidney disease, stage 3 (moderate): Secondary | ICD-10-CM | POA: Diagnosis not present

## 2019-06-19 DIAGNOSIS — N183 Chronic kidney disease, stage 3 (moderate): Secondary | ICD-10-CM | POA: Diagnosis not present

## 2019-06-24 DIAGNOSIS — I129 Hypertensive chronic kidney disease with stage 1 through stage 4 chronic kidney disease, or unspecified chronic kidney disease: Secondary | ICD-10-CM | POA: Diagnosis not present

## 2019-06-24 DIAGNOSIS — N183 Chronic kidney disease, stage 3 (moderate): Secondary | ICD-10-CM | POA: Diagnosis not present

## 2019-06-24 DIAGNOSIS — E119 Type 2 diabetes mellitus without complications: Secondary | ICD-10-CM | POA: Diagnosis not present

## 2019-06-24 DIAGNOSIS — E876 Hypokalemia: Secondary | ICD-10-CM | POA: Diagnosis not present

## 2019-07-14 DIAGNOSIS — R8290 Unspecified abnormal findings in urine: Secondary | ICD-10-CM | POA: Diagnosis not present

## 2019-08-09 DIAGNOSIS — H04123 Dry eye syndrome of bilateral lacrimal glands: Secondary | ICD-10-CM | POA: Diagnosis not present

## 2019-08-09 DIAGNOSIS — H40013 Open angle with borderline findings, low risk, bilateral: Secondary | ICD-10-CM | POA: Diagnosis not present

## 2019-08-09 DIAGNOSIS — E119 Type 2 diabetes mellitus without complications: Secondary | ICD-10-CM | POA: Diagnosis not present

## 2019-09-11 DIAGNOSIS — Z23 Encounter for immunization: Secondary | ICD-10-CM | POA: Diagnosis not present

## 2019-10-19 DIAGNOSIS — N183 Chronic kidney disease, stage 3 unspecified: Secondary | ICD-10-CM | POA: Diagnosis not present

## 2019-10-22 DIAGNOSIS — E782 Mixed hyperlipidemia: Secondary | ICD-10-CM | POA: Diagnosis not present

## 2019-10-22 DIAGNOSIS — N189 Chronic kidney disease, unspecified: Secondary | ICD-10-CM | POA: Diagnosis not present

## 2019-10-22 DIAGNOSIS — G8929 Other chronic pain: Secondary | ICD-10-CM | POA: Diagnosis not present

## 2019-10-22 DIAGNOSIS — E2740 Unspecified adrenocortical insufficiency: Secondary | ICD-10-CM | POA: Diagnosis not present

## 2019-10-22 DIAGNOSIS — H409 Unspecified glaucoma: Secondary | ICD-10-CM | POA: Diagnosis not present

## 2019-10-22 DIAGNOSIS — E1142 Type 2 diabetes mellitus with diabetic polyneuropathy: Secondary | ICD-10-CM | POA: Diagnosis not present

## 2019-10-22 DIAGNOSIS — E1122 Type 2 diabetes mellitus with diabetic chronic kidney disease: Secondary | ICD-10-CM | POA: Diagnosis not present

## 2019-10-22 DIAGNOSIS — I10 Essential (primary) hypertension: Secondary | ICD-10-CM | POA: Diagnosis not present

## 2019-10-22 DIAGNOSIS — R8290 Unspecified abnormal findings in urine: Secondary | ICD-10-CM | POA: Diagnosis not present

## 2019-10-22 DIAGNOSIS — M810 Age-related osteoporosis without current pathological fracture: Secondary | ICD-10-CM | POA: Diagnosis not present

## 2019-10-22 DIAGNOSIS — D860 Sarcoidosis of lung: Secondary | ICD-10-CM | POA: Diagnosis not present

## 2019-10-22 DIAGNOSIS — G2581 Restless legs syndrome: Secondary | ICD-10-CM | POA: Diagnosis not present

## 2019-10-26 DIAGNOSIS — N183 Chronic kidney disease, stage 3 unspecified: Secondary | ICD-10-CM | POA: Diagnosis not present

## 2019-10-26 DIAGNOSIS — E2740 Unspecified adrenocortical insufficiency: Secondary | ICD-10-CM | POA: Diagnosis not present

## 2019-10-26 DIAGNOSIS — N1832 Chronic kidney disease, stage 3b: Secondary | ICD-10-CM | POA: Diagnosis not present

## 2019-10-26 DIAGNOSIS — E119 Type 2 diabetes mellitus without complications: Secondary | ICD-10-CM | POA: Diagnosis not present

## 2019-10-26 DIAGNOSIS — I129 Hypertensive chronic kidney disease with stage 1 through stage 4 chronic kidney disease, or unspecified chronic kidney disease: Secondary | ICD-10-CM | POA: Diagnosis not present

## 2019-10-29 DIAGNOSIS — H409 Unspecified glaucoma: Secondary | ICD-10-CM | POA: Diagnosis not present

## 2019-10-29 DIAGNOSIS — N183 Chronic kidney disease, stage 3 unspecified: Secondary | ICD-10-CM | POA: Diagnosis not present

## 2019-10-29 DIAGNOSIS — M810 Age-related osteoporosis without current pathological fracture: Secondary | ICD-10-CM | POA: Diagnosis not present

## 2019-10-29 DIAGNOSIS — E1142 Type 2 diabetes mellitus with diabetic polyneuropathy: Secondary | ICD-10-CM | POA: Diagnosis not present

## 2019-10-29 DIAGNOSIS — I10 Essential (primary) hypertension: Secondary | ICD-10-CM | POA: Diagnosis not present

## 2019-10-29 DIAGNOSIS — R197 Diarrhea, unspecified: Secondary | ICD-10-CM | POA: Diagnosis not present

## 2019-10-29 DIAGNOSIS — M1990 Unspecified osteoarthritis, unspecified site: Secondary | ICD-10-CM | POA: Diagnosis not present

## 2019-10-29 DIAGNOSIS — E1122 Type 2 diabetes mellitus with diabetic chronic kidney disease: Secondary | ICD-10-CM | POA: Diagnosis not present

## 2019-10-29 DIAGNOSIS — M8580 Other specified disorders of bone density and structure, unspecified site: Secondary | ICD-10-CM | POA: Diagnosis not present

## 2019-10-29 DIAGNOSIS — N189 Chronic kidney disease, unspecified: Secondary | ICD-10-CM | POA: Diagnosis not present

## 2019-10-29 DIAGNOSIS — E782 Mixed hyperlipidemia: Secondary | ICD-10-CM | POA: Diagnosis not present

## 2019-11-03 DIAGNOSIS — M810 Age-related osteoporosis without current pathological fracture: Secondary | ICD-10-CM | POA: Diagnosis not present

## 2019-11-03 DIAGNOSIS — E1122 Type 2 diabetes mellitus with diabetic chronic kidney disease: Secondary | ICD-10-CM | POA: Diagnosis not present

## 2019-11-03 DIAGNOSIS — M1990 Unspecified osteoarthritis, unspecified site: Secondary | ICD-10-CM | POA: Diagnosis not present

## 2019-11-03 DIAGNOSIS — E782 Mixed hyperlipidemia: Secondary | ICD-10-CM | POA: Diagnosis not present

## 2019-11-03 DIAGNOSIS — E1142 Type 2 diabetes mellitus with diabetic polyneuropathy: Secondary | ICD-10-CM | POA: Diagnosis not present

## 2019-11-03 DIAGNOSIS — N183 Chronic kidney disease, stage 3 unspecified: Secondary | ICD-10-CM | POA: Diagnosis not present

## 2019-11-03 DIAGNOSIS — H409 Unspecified glaucoma: Secondary | ICD-10-CM | POA: Diagnosis not present

## 2019-11-03 DIAGNOSIS — I10 Essential (primary) hypertension: Secondary | ICD-10-CM | POA: Diagnosis not present

## 2019-11-10 ENCOUNTER — Other Ambulatory Visit: Payer: Self-pay | Admitting: Family Medicine

## 2019-11-10 DIAGNOSIS — Z1231 Encounter for screening mammogram for malignant neoplasm of breast: Secondary | ICD-10-CM

## 2019-11-24 DIAGNOSIS — N3000 Acute cystitis without hematuria: Secondary | ICD-10-CM | POA: Diagnosis not present

## 2019-11-24 DIAGNOSIS — N369 Urethral disorder, unspecified: Secondary | ICD-10-CM | POA: Diagnosis not present

## 2019-12-01 DIAGNOSIS — Z0000 Encounter for general adult medical examination without abnormal findings: Secondary | ICD-10-CM | POA: Diagnosis not present

## 2019-12-02 DIAGNOSIS — I10 Essential (primary) hypertension: Secondary | ICD-10-CM | POA: Diagnosis not present

## 2019-12-02 DIAGNOSIS — H409 Unspecified glaucoma: Secondary | ICD-10-CM | POA: Diagnosis not present

## 2019-12-02 DIAGNOSIS — G2581 Restless legs syndrome: Secondary | ICD-10-CM | POA: Diagnosis not present

## 2019-12-02 DIAGNOSIS — E11621 Type 2 diabetes mellitus with foot ulcer: Secondary | ICD-10-CM | POA: Diagnosis not present

## 2019-12-02 DIAGNOSIS — E1142 Type 2 diabetes mellitus with diabetic polyneuropathy: Secondary | ICD-10-CM | POA: Diagnosis not present

## 2019-12-02 DIAGNOSIS — D8689 Sarcoidosis of other sites: Secondary | ICD-10-CM | POA: Diagnosis not present

## 2019-12-02 DIAGNOSIS — G8929 Other chronic pain: Secondary | ICD-10-CM | POA: Diagnosis not present

## 2019-12-02 DIAGNOSIS — L97511 Non-pressure chronic ulcer of other part of right foot limited to breakdown of skin: Secondary | ICD-10-CM | POA: Diagnosis not present

## 2019-12-02 DIAGNOSIS — E785 Hyperlipidemia, unspecified: Secondary | ICD-10-CM | POA: Diagnosis not present

## 2019-12-02 DIAGNOSIS — M0550 Rheumatoid polyneuropathy with rheumatoid arthritis of unspecified site: Secondary | ICD-10-CM | POA: Diagnosis not present

## 2019-12-06 DIAGNOSIS — S80811A Abrasion, right lower leg, initial encounter: Secondary | ICD-10-CM | POA: Diagnosis not present

## 2019-12-06 DIAGNOSIS — L03119 Cellulitis of unspecified part of limb: Secondary | ICD-10-CM | POA: Diagnosis not present

## 2019-12-06 DIAGNOSIS — W5503XA Scratched by cat, initial encounter: Secondary | ICD-10-CM | POA: Diagnosis not present

## 2019-12-08 DIAGNOSIS — N183 Chronic kidney disease, stage 3 unspecified: Secondary | ICD-10-CM | POA: Diagnosis not present

## 2019-12-08 DIAGNOSIS — S80811A Abrasion, right lower leg, initial encounter: Secondary | ICD-10-CM | POA: Diagnosis not present

## 2019-12-08 DIAGNOSIS — W5503XA Scratched by cat, initial encounter: Secondary | ICD-10-CM | POA: Diagnosis not present

## 2019-12-08 DIAGNOSIS — L03119 Cellulitis of unspecified part of limb: Secondary | ICD-10-CM | POA: Diagnosis not present

## 2019-12-10 DIAGNOSIS — N183 Chronic kidney disease, stage 3 unspecified: Secondary | ICD-10-CM | POA: Diagnosis not present

## 2019-12-10 DIAGNOSIS — L03119 Cellulitis of unspecified part of limb: Secondary | ICD-10-CM | POA: Diagnosis not present

## 2019-12-16 DIAGNOSIS — E1142 Type 2 diabetes mellitus with diabetic polyneuropathy: Secondary | ICD-10-CM | POA: Diagnosis not present

## 2019-12-16 DIAGNOSIS — M8580 Other specified disorders of bone density and structure, unspecified site: Secondary | ICD-10-CM | POA: Diagnosis not present

## 2019-12-16 DIAGNOSIS — E1122 Type 2 diabetes mellitus with diabetic chronic kidney disease: Secondary | ICD-10-CM | POA: Diagnosis not present

## 2019-12-16 DIAGNOSIS — I10 Essential (primary) hypertension: Secondary | ICD-10-CM | POA: Diagnosis not present

## 2019-12-16 DIAGNOSIS — M1990 Unspecified osteoarthritis, unspecified site: Secondary | ICD-10-CM | POA: Diagnosis not present

## 2019-12-16 DIAGNOSIS — H409 Unspecified glaucoma: Secondary | ICD-10-CM | POA: Diagnosis not present

## 2019-12-16 DIAGNOSIS — E782 Mixed hyperlipidemia: Secondary | ICD-10-CM | POA: Diagnosis not present

## 2019-12-16 DIAGNOSIS — N183 Chronic kidney disease, stage 3 unspecified: Secondary | ICD-10-CM | POA: Diagnosis not present

## 2019-12-16 DIAGNOSIS — M810 Age-related osteoporosis without current pathological fracture: Secondary | ICD-10-CM | POA: Diagnosis not present

## 2020-01-06 ENCOUNTER — Ambulatory Visit: Payer: Medicare HMO

## 2020-01-18 DIAGNOSIS — N3000 Acute cystitis without hematuria: Secondary | ICD-10-CM | POA: Diagnosis not present

## 2020-01-24 DIAGNOSIS — H40013 Open angle with borderline findings, low risk, bilateral: Secondary | ICD-10-CM | POA: Diagnosis not present

## 2020-01-24 DIAGNOSIS — E119 Type 2 diabetes mellitus without complications: Secondary | ICD-10-CM | POA: Diagnosis not present

## 2020-01-24 DIAGNOSIS — H04123 Dry eye syndrome of bilateral lacrimal glands: Secondary | ICD-10-CM | POA: Diagnosis not present

## 2020-01-26 DIAGNOSIS — M1990 Unspecified osteoarthritis, unspecified site: Secondary | ICD-10-CM | POA: Diagnosis not present

## 2020-01-26 DIAGNOSIS — M810 Age-related osteoporosis without current pathological fracture: Secondary | ICD-10-CM | POA: Diagnosis not present

## 2020-01-26 DIAGNOSIS — N183 Chronic kidney disease, stage 3 unspecified: Secondary | ICD-10-CM | POA: Diagnosis not present

## 2020-01-26 DIAGNOSIS — E1122 Type 2 diabetes mellitus with diabetic chronic kidney disease: Secondary | ICD-10-CM | POA: Diagnosis not present

## 2020-01-26 DIAGNOSIS — H409 Unspecified glaucoma: Secondary | ICD-10-CM | POA: Diagnosis not present

## 2020-01-26 DIAGNOSIS — I10 Essential (primary) hypertension: Secondary | ICD-10-CM | POA: Diagnosis not present

## 2020-01-26 DIAGNOSIS — M8580 Other specified disorders of bone density and structure, unspecified site: Secondary | ICD-10-CM | POA: Diagnosis not present

## 2020-01-26 DIAGNOSIS — E782 Mixed hyperlipidemia: Secondary | ICD-10-CM | POA: Diagnosis not present

## 2020-01-26 DIAGNOSIS — E1142 Type 2 diabetes mellitus with diabetic polyneuropathy: Secondary | ICD-10-CM | POA: Diagnosis not present

## 2020-01-29 ENCOUNTER — Ambulatory Visit: Payer: Medicare HMO

## 2020-02-04 ENCOUNTER — Ambulatory Visit: Payer: Medicare HMO | Attending: Internal Medicine

## 2020-02-04 DIAGNOSIS — Z23 Encounter for immunization: Secondary | ICD-10-CM

## 2020-02-04 NOTE — Progress Notes (Signed)
   Covid-19 Vaccination Clinic  Name:  Sonya Goodwin    MRN: [id number] DOB: [birth year]  02/04/2020  Sonya Goodwin was observed post Covid-19 immunization for 15 minutes without incidence. She was provided with Vaccine Information Sheet and instruction to access the V-Safe system.   Sonya Goodwin was instructed to call 911 with any severe reactions post vaccine: Marland Kitchen Difficulty breathing  . Swelling of your face and throat  . A fast heartbeat  . A bad rash all over your body  . Dizziness and weakness    Immunizations Administered    Name Date Dose VIS Date Route   Pfizer COVID-19 Vaccine 02/04/2020 11:57 AM 0.3 mL 12/10/2019 Intramuscular   Manufacturer: [place]   Lot: [id number]   [place]: [id number]

## 2020-02-29 ENCOUNTER — Telehealth: Payer: Self-pay | Admitting: *Deleted

## 2020-02-29 ENCOUNTER — Ambulatory Visit: Payer: Medicare HMO | Attending: Internal Medicine

## 2020-02-29 DIAGNOSIS — Z23 Encounter for immunization: Secondary | ICD-10-CM | POA: Insufficient documentation

## 2020-02-29 NOTE — Telephone Encounter (Signed)
New Message  Mandy from [facility] is calling in to follow up about the htn clinic referral that was faxed over to [phone] for patient. Please give Leafy Ro a call back.

## 2020-02-29 NOTE — Telephone Encounter (Signed)
Verified with [facility] referral for Lipid clinic not HTN clinic Referral received and given to [facility] for scheduling

## 2020-02-29 NOTE — Progress Notes (Signed)
   Covid-19 Vaccination Clinic  Name:  ANGELLA SNOWDEN    MRN: [id number] DOB: [birth year]  02/29/2020  Ms. Merica was observed post Covid-19 immunization for 15 minutes without incident. She was provided with Vaccine Information Sheet and instruction to access the V-Safe system.   Ms. Ramelb was instructed to call 911 with any severe reactions post vaccine: Marland Kitchen Difficulty breathing  . Swelling of face and throat  . A fast heartbeat  . A bad rash all over body  . Dizziness and weakness   Immunizations Administered    Name Date Dose VIS Date Route   Pfizer COVID-19 Vaccine 02/29/2020 11:53 AM 0.3 mL 12/10/2019 Intramuscular   Manufacturer: [place]   Lot: [id number]   [place]: [id number]

## 2020-03-02 DIAGNOSIS — M810 Age-related osteoporosis without current pathological fracture: Secondary | ICD-10-CM | POA: Diagnosis not present

## 2020-03-02 DIAGNOSIS — N183 Chronic kidney disease, stage 3 unspecified: Secondary | ICD-10-CM | POA: Diagnosis not present

## 2020-03-02 DIAGNOSIS — E1122 Type 2 diabetes mellitus with diabetic chronic kidney disease: Secondary | ICD-10-CM | POA: Diagnosis not present

## 2020-03-02 DIAGNOSIS — N189 Chronic kidney disease, unspecified: Secondary | ICD-10-CM | POA: Diagnosis not present

## 2020-03-02 DIAGNOSIS — M8580 Other specified disorders of bone density and structure, unspecified site: Secondary | ICD-10-CM | POA: Diagnosis not present

## 2020-03-02 DIAGNOSIS — I10 Essential (primary) hypertension: Secondary | ICD-10-CM | POA: Diagnosis not present

## 2020-03-02 DIAGNOSIS — H409 Unspecified glaucoma: Secondary | ICD-10-CM | POA: Diagnosis not present

## 2020-03-02 DIAGNOSIS — E1142 Type 2 diabetes mellitus with diabetic polyneuropathy: Secondary | ICD-10-CM | POA: Diagnosis not present

## 2020-03-02 DIAGNOSIS — M1990 Unspecified osteoarthritis, unspecified site: Secondary | ICD-10-CM | POA: Diagnosis not present

## 2020-03-02 DIAGNOSIS — E782 Mixed hyperlipidemia: Secondary | ICD-10-CM | POA: Diagnosis not present

## 2020-03-11 DIAGNOSIS — N1832 Chronic kidney disease, stage 3b: Secondary | ICD-10-CM | POA: Diagnosis not present

## 2020-03-15 DIAGNOSIS — E876 Hypokalemia: Secondary | ICD-10-CM | POA: Diagnosis not present

## 2020-03-15 DIAGNOSIS — E119 Type 2 diabetes mellitus without complications: Secondary | ICD-10-CM | POA: Diagnosis not present

## 2020-03-15 DIAGNOSIS — N1832 Chronic kidney disease, stage 3b: Secondary | ICD-10-CM | POA: Diagnosis not present

## 2020-03-15 DIAGNOSIS — N183 Chronic kidney disease, stage 3 unspecified: Secondary | ICD-10-CM | POA: Diagnosis not present

## 2020-03-15 DIAGNOSIS — I129 Hypertensive chronic kidney disease with stage 1 through stage 4 chronic kidney disease, or unspecified chronic kidney disease: Secondary | ICD-10-CM | POA: Diagnosis not present

## 2020-03-15 DIAGNOSIS — E2740 Unspecified adrenocortical insufficiency: Secondary | ICD-10-CM | POA: Diagnosis not present

## 2020-04-19 ENCOUNTER — Ambulatory Visit: Payer: Medicare HMO | Admitting: Internal Medicine

## 2020-04-19 DIAGNOSIS — M810 Age-related osteoporosis without current pathological fracture: Secondary | ICD-10-CM | POA: Diagnosis not present

## 2020-04-19 DIAGNOSIS — M8580 Other specified disorders of bone density and structure, unspecified site: Secondary | ICD-10-CM | POA: Diagnosis not present

## 2020-04-19 DIAGNOSIS — E1142 Type 2 diabetes mellitus with diabetic polyneuropathy: Secondary | ICD-10-CM | POA: Diagnosis not present

## 2020-04-19 DIAGNOSIS — I10 Essential (primary) hypertension: Secondary | ICD-10-CM | POA: Diagnosis not present

## 2020-04-19 DIAGNOSIS — E782 Mixed hyperlipidemia: Secondary | ICD-10-CM | POA: Diagnosis not present

## 2020-04-19 DIAGNOSIS — H409 Unspecified glaucoma: Secondary | ICD-10-CM | POA: Diagnosis not present

## 2020-04-19 DIAGNOSIS — M1990 Unspecified osteoarthritis, unspecified site: Secondary | ICD-10-CM | POA: Diagnosis not present

## 2020-04-19 DIAGNOSIS — N183 Chronic kidney disease, stage 3 unspecified: Secondary | ICD-10-CM | POA: Diagnosis not present

## 2020-04-19 DIAGNOSIS — E1122 Type 2 diabetes mellitus with diabetic chronic kidney disease: Secondary | ICD-10-CM | POA: Diagnosis not present

## 2020-04-19 DIAGNOSIS — N189 Chronic kidney disease, unspecified: Secondary | ICD-10-CM | POA: Diagnosis not present

## 2020-04-27 DIAGNOSIS — I10 Essential (primary) hypertension: Secondary | ICD-10-CM | POA: Diagnosis not present

## 2020-04-27 DIAGNOSIS — M810 Age-related osteoporosis without current pathological fracture: Secondary | ICD-10-CM | POA: Diagnosis not present

## 2020-04-27 DIAGNOSIS — M8580 Other specified disorders of bone density and structure, unspecified site: Secondary | ICD-10-CM | POA: Diagnosis not present

## 2020-04-27 DIAGNOSIS — E2740 Unspecified adrenocortical insufficiency: Secondary | ICD-10-CM | POA: Diagnosis not present

## 2020-04-27 DIAGNOSIS — E1122 Type 2 diabetes mellitus with diabetic chronic kidney disease: Secondary | ICD-10-CM | POA: Diagnosis not present

## 2020-04-27 DIAGNOSIS — E782 Mixed hyperlipidemia: Secondary | ICD-10-CM | POA: Diagnosis not present

## 2020-04-27 DIAGNOSIS — G720 Drug-induced myopathy: Secondary | ICD-10-CM | POA: Diagnosis not present

## 2020-04-27 DIAGNOSIS — K219 Gastro-esophageal reflux disease without esophagitis: Secondary | ICD-10-CM | POA: Diagnosis not present

## 2020-04-27 DIAGNOSIS — N183 Chronic kidney disease, stage 3 unspecified: Secondary | ICD-10-CM | POA: Diagnosis not present

## 2020-04-27 DIAGNOSIS — E1142 Type 2 diabetes mellitus with diabetic polyneuropathy: Secondary | ICD-10-CM | POA: Diagnosis not present

## 2020-05-19 DIAGNOSIS — K219 Gastro-esophageal reflux disease without esophagitis: Secondary | ICD-10-CM | POA: Diagnosis not present

## 2020-05-19 DIAGNOSIS — E1142 Type 2 diabetes mellitus with diabetic polyneuropathy: Secondary | ICD-10-CM | POA: Diagnosis not present

## 2020-05-19 DIAGNOSIS — G4700 Insomnia, unspecified: Secondary | ICD-10-CM | POA: Diagnosis not present

## 2020-05-19 DIAGNOSIS — D860 Sarcoidosis of lung: Secondary | ICD-10-CM | POA: Diagnosis not present

## 2020-05-19 DIAGNOSIS — M1990 Unspecified osteoarthritis, unspecified site: Secondary | ICD-10-CM | POA: Diagnosis not present

## 2020-05-19 DIAGNOSIS — E785 Hyperlipidemia, unspecified: Secondary | ICD-10-CM | POA: Diagnosis not present

## 2020-05-19 DIAGNOSIS — G8929 Other chronic pain: Secondary | ICD-10-CM | POA: Diagnosis not present

## 2020-05-19 DIAGNOSIS — H409 Unspecified glaucoma: Secondary | ICD-10-CM | POA: Diagnosis not present

## 2020-05-19 DIAGNOSIS — I10 Essential (primary) hypertension: Secondary | ICD-10-CM | POA: Diagnosis not present

## 2020-05-19 DIAGNOSIS — G2581 Restless legs syndrome: Secondary | ICD-10-CM | POA: Diagnosis not present

## 2020-05-22 ENCOUNTER — Institutional Professional Consult (permissible substitution): Payer: Medicare HMO | Admitting: Internal Medicine

## 2020-06-15 ENCOUNTER — Encounter (HOSPITAL_BASED_OUTPATIENT_CLINIC_OR_DEPARTMENT_OTHER): Payer: Self-pay | Admitting: Emergency Medicine

## 2020-06-15 ENCOUNTER — Emergency Department (HOSPITAL_BASED_OUTPATIENT_CLINIC_OR_DEPARTMENT_OTHER): Payer: Medicare HMO

## 2020-06-15 ENCOUNTER — Other Ambulatory Visit: Payer: Self-pay

## 2020-06-15 ENCOUNTER — Inpatient Hospital Stay (HOSPITAL_BASED_OUTPATIENT_CLINIC_OR_DEPARTMENT_OTHER)
Admission: EM | Admit: 2020-06-15 | Discharge: 2020-06-21 | DRG: 917 | Disposition: A | Discharge status: Home Health | Payer: Medicare HMO | Attending: Internal Medicine | Admitting: Internal Medicine

## 2020-06-15 DIAGNOSIS — H9191 Unspecified hearing loss, right ear: Secondary | ICD-10-CM | POA: Diagnosis present

## 2020-06-15 DIAGNOSIS — N390 Urinary tract infection, site not specified: Secondary | ICD-10-CM

## 2020-06-15 DIAGNOSIS — G4700 Insomnia, unspecified: Secondary | ICD-10-CM | POA: Diagnosis present

## 2020-06-15 DIAGNOSIS — D860 Sarcoidosis of lung: Secondary | ICD-10-CM | POA: Diagnosis not present

## 2020-06-15 DIAGNOSIS — M1990 Unspecified osteoarthritis, unspecified site: Secondary | ICD-10-CM | POA: Diagnosis not present

## 2020-06-15 DIAGNOSIS — M8580 Other specified disorders of bone density and structure, unspecified site: Secondary | ICD-10-CM | POA: Diagnosis not present

## 2020-06-15 DIAGNOSIS — E1121 Type 2 diabetes mellitus with diabetic nephropathy: Secondary | ICD-10-CM | POA: Diagnosis not present

## 2020-06-15 DIAGNOSIS — T65891A Toxic effect of other specified substances, accidental (unintentional), initial encounter: Secondary | ICD-10-CM | POA: Diagnosis not present

## 2020-06-15 DIAGNOSIS — E2740 Unspecified adrenocortical insufficiency: Secondary | ICD-10-CM | POA: Diagnosis present

## 2020-06-15 DIAGNOSIS — F05 Delirium due to known physiological condition: Secondary | ICD-10-CM | POA: Diagnosis not present

## 2020-06-15 DIAGNOSIS — G2581 Restless legs syndrome: Secondary | ICD-10-CM | POA: Diagnosis present

## 2020-06-15 DIAGNOSIS — E876 Hypokalemia: Secondary | ICD-10-CM | POA: Diagnosis present

## 2020-06-15 DIAGNOSIS — T782XXA Anaphylactic shock, unspecified, initial encounter: Secondary | ICD-10-CM | POA: Diagnosis not present

## 2020-06-15 DIAGNOSIS — Z20822 Contact with and (suspected) exposure to covid-19: Secondary | ICD-10-CM | POA: Diagnosis not present

## 2020-06-15 DIAGNOSIS — R531 Weakness: Secondary | ICD-10-CM | POA: Diagnosis not present

## 2020-06-15 DIAGNOSIS — N183 Chronic kidney disease, stage 3 unspecified: Secondary | ICD-10-CM | POA: Diagnosis not present

## 2020-06-15 DIAGNOSIS — D869 Sarcoidosis, unspecified: Secondary | ICD-10-CM | POA: Diagnosis present

## 2020-06-15 DIAGNOSIS — Z8249 Family history of ischemic heart disease and other diseases of the circulatory system: Secondary | ICD-10-CM

## 2020-06-15 DIAGNOSIS — Z90711 Acquired absence of uterus with remaining cervical stump: Secondary | ICD-10-CM

## 2020-06-15 DIAGNOSIS — R69 Illness, unspecified: Secondary | ICD-10-CM | POA: Diagnosis not present

## 2020-06-15 DIAGNOSIS — M810 Age-related osteoporosis without current pathological fracture: Secondary | ICD-10-CM | POA: Diagnosis not present

## 2020-06-15 DIAGNOSIS — G9340 Encephalopathy, unspecified: Secondary | ICD-10-CM | POA: Diagnosis present

## 2020-06-15 DIAGNOSIS — Z87410 Personal history of cervical dysplasia: Secondary | ICD-10-CM

## 2020-06-15 DIAGNOSIS — I10 Essential (primary) hypertension: Secondary | ICD-10-CM | POA: Diagnosis not present

## 2020-06-15 DIAGNOSIS — R5381 Other malaise: Secondary | ICD-10-CM | POA: Diagnosis present

## 2020-06-15 DIAGNOSIS — E7800 Pure hypercholesterolemia, unspecified: Secondary | ICD-10-CM | POA: Diagnosis present

## 2020-06-15 DIAGNOSIS — Z888 Allergy status to other drugs, medicaments and biological substances status: Secondary | ICD-10-CM

## 2020-06-15 DIAGNOSIS — Y92009 Unspecified place in unspecified non-institutional (private) residence as the place of occurrence of the external cause: Secondary | ICD-10-CM | POA: Diagnosis not present

## 2020-06-15 DIAGNOSIS — K5900 Constipation, unspecified: Secondary | ICD-10-CM | POA: Diagnosis present

## 2020-06-15 DIAGNOSIS — G92 Toxic encephalopathy: Secondary | ICD-10-CM | POA: Diagnosis not present

## 2020-06-15 DIAGNOSIS — Z7982 Long term (current) use of aspirin: Secondary | ICD-10-CM

## 2020-06-15 DIAGNOSIS — E538 Deficiency of other specified B group vitamins: Secondary | ICD-10-CM | POA: Diagnosis present

## 2020-06-15 DIAGNOSIS — T380X5A Adverse effect of glucocorticoids and synthetic analogues, initial encounter: Secondary | ICD-10-CM | POA: Diagnosis present

## 2020-06-15 DIAGNOSIS — Z85828 Personal history of other malignant neoplasm of skin: Secondary | ICD-10-CM | POA: Diagnosis not present

## 2020-06-15 DIAGNOSIS — N1831 Chronic kidney disease, stage 3a: Secondary | ICD-10-CM | POA: Diagnosis present

## 2020-06-15 DIAGNOSIS — E860 Dehydration: Secondary | ICD-10-CM | POA: Diagnosis not present

## 2020-06-15 DIAGNOSIS — E785 Hyperlipidemia, unspecified: Secondary | ICD-10-CM | POA: Diagnosis present

## 2020-06-15 DIAGNOSIS — E1122 Type 2 diabetes mellitus with diabetic chronic kidney disease: Secondary | ICD-10-CM | POA: Diagnosis not present

## 2020-06-15 DIAGNOSIS — T380X1A Poisoning by glucocorticoids and synthetic analogues, accidental (unintentional), initial encounter: Secondary | ICD-10-CM | POA: Diagnosis present

## 2020-06-15 DIAGNOSIS — I4510 Unspecified right bundle-branch block: Secondary | ICD-10-CM | POA: Diagnosis present

## 2020-06-15 DIAGNOSIS — Z9049 Acquired absence of other specified parts of digestive tract: Secondary | ICD-10-CM

## 2020-06-15 DIAGNOSIS — H409 Unspecified glaucoma: Secondary | ICD-10-CM | POA: Diagnosis present

## 2020-06-15 DIAGNOSIS — E1143 Type 2 diabetes mellitus with diabetic autonomic (poly)neuropathy: Secondary | ICD-10-CM | POA: Diagnosis not present

## 2020-06-15 DIAGNOSIS — G4730 Sleep apnea, unspecified: Secondary | ICD-10-CM | POA: Diagnosis present

## 2020-06-15 DIAGNOSIS — I129 Hypertensive chronic kidney disease with stage 1 through stage 4 chronic kidney disease, or unspecified chronic kidney disease: Secondary | ICD-10-CM | POA: Diagnosis present

## 2020-06-15 DIAGNOSIS — K219 Gastro-esophageal reflux disease without esophagitis: Secondary | ICD-10-CM | POA: Diagnosis present

## 2020-06-15 DIAGNOSIS — E1142 Type 2 diabetes mellitus with diabetic polyneuropathy: Secondary | ICD-10-CM | POA: Diagnosis not present

## 2020-06-15 DIAGNOSIS — Z974 Presence of external hearing-aid: Secondary | ICD-10-CM

## 2020-06-15 DIAGNOSIS — Z79899 Other long term (current) drug therapy: Secondary | ICD-10-CM

## 2020-06-15 DIAGNOSIS — E782 Mixed hyperlipidemia: Secondary | ICD-10-CM | POA: Diagnosis not present

## 2020-06-15 DIAGNOSIS — Z87442 Personal history of urinary calculi: Secondary | ICD-10-CM

## 2020-06-15 DIAGNOSIS — N189 Chronic kidney disease, unspecified: Secondary | ICD-10-CM | POA: Diagnosis not present

## 2020-06-15 LAB — URINALYSIS, ROUTINE W REFLEX MICROSCOPIC
Bilirubin Urine: NEGATIVE
Glucose, UA: NEGATIVE mg/dL
Ketones, ur: NEGATIVE mg/dL
Nitrite: POSITIVE — AB
Protein, ur: NEGATIVE mg/dL
Specific Gravity, Urine: <1.005 — ABNORMAL LOW (ref 1.005–1.030)
pH: 5.5 (ref 5.0–8.0)

## 2020-06-15 LAB — GLUCOSE, CAPILLARY: Glucose-Capillary: 127 mg/dL — ABNORMAL HIGH (ref 70–99)

## 2020-06-15 LAB — CBC WITH DIFFERENTIAL/PLATELET
Abs Immature Granulocytes: 0.12 10*3/uL — ABNORMAL HIGH (ref 0.00–0.07)
Basophils Absolute: 0 10*3/uL (ref 0.0–0.1)
Basophils Relative: 0 %
Eosinophils Absolute: 0 10*3/uL (ref 0.0–0.5)
Eosinophils Relative: 0 %
HCT: 47.3 % — ABNORMAL HIGH (ref 36.0–46.0)
Hemoglobin: 15.6 g/dL — ABNORMAL HIGH (ref 12.0–15.0)
Immature Granulocytes: 1 %
Lymphocytes Relative: 9 %
Lymphs Abs: 1.1 10*3/uL (ref 0.7–4.0)
MCH: 31.1 pg (ref 26.0–34.0)
MCHC: 33 g/dL (ref 30.0–36.0)
MCV: 94.2 fL (ref 80.0–100.0)
Monocytes Absolute: 0.3 10*3/uL (ref 0.1–1.0)
Monocytes Relative: 2 %
Neutro Abs: 11.6 10*3/uL — ABNORMAL HIGH (ref 1.7–7.7)
Neutrophils Relative %: 88 %
Platelets: 210 10*3/uL (ref 150–400)
RBC: 5.02 MIL/uL (ref 3.87–5.11)
RDW: 14.1 % (ref 11.5–15.5)
WBC: 13.1 10*3/uL — ABNORMAL HIGH (ref 4.0–10.5)
nRBC: 0 % (ref 0.0–0.2)

## 2020-06-15 LAB — CBG MONITORING, ED: Glucose-Capillary: 157 mg/dL — ABNORMAL HIGH (ref 70–99)

## 2020-06-15 LAB — COMPREHENSIVE METABOLIC PANEL
ALT: 26 U/L (ref 0–44)
AST: 32 U/L (ref 15–41)
Albumin: 4.3 g/dL (ref 3.5–5.0)
Alkaline Phosphatase: 34 U/L — ABNORMAL LOW (ref 38–126)
Anion gap: 17 — ABNORMAL HIGH (ref 5–15)
BUN: 24 mg/dL — ABNORMAL HIGH (ref 8–23)
CO2: 25 mmol/L (ref 22–32)
Calcium: 9.6 mg/dL (ref 8.9–10.3)
Chloride: 96 mmol/L — ABNORMAL LOW (ref 98–111)
Creatinine, Ser: 1.11 mg/dL — ABNORMAL HIGH (ref 0.44–1.00)
GFR calc Af Amer: 55 mL/min — ABNORMAL LOW (ref >60)
GFR calc non Af Amer: 47 mL/min — ABNORMAL LOW (ref >60)
Glucose, Bld: 168 mg/dL — ABNORMAL HIGH (ref 70–99)
Potassium: 3 mmol/L — ABNORMAL LOW (ref 3.5–5.1)
Sodium: 138 mmol/L (ref 135–145)
Total Bilirubin: 0.9 mg/dL (ref 0.3–1.2)
Total Protein: 7.7 g/dL (ref 6.5–8.1)

## 2020-06-15 LAB — URINALYSIS, MICROSCOPIC (REFLEX)

## 2020-06-15 LAB — LACTIC ACID, PLASMA: Lactic Acid, Venous: 2.3 mmol/L (ref 0.5–1.9)

## 2020-06-15 LAB — SARS CORONAVIRUS 2 BY RT PCR (HOSPITAL ORDER, PERFORMED IN ~~LOC~~ HOSPITAL LAB): SARS Coronavirus 2: NEGATIVE

## 2020-06-15 LAB — LIPASE, BLOOD: Lipase: 29 U/L (ref 11–51)

## 2020-06-15 LAB — MAGNESIUM: Magnesium: 1.9 mg/dL (ref 1.7–2.4)

## 2020-06-15 MED ORDER — SODIUM CHLORIDE 0.9 % IV SOLN: INTRAVENOUS | Status: AC

## 2020-06-15 MED ORDER — INSULIN ASPART 100 UNIT/ML ~~LOC~~ SOLN
0.0000 [IU] | Freq: Three times a day (TID) | SUBCUTANEOUS | Status: DC
  Administered 2020-06-16 – 2020-06-18 (×4): 1 [IU] via SUBCUTANEOUS
  Administered 2020-06-18: 3 [IU] via SUBCUTANEOUS
  Administered 2020-06-19: 1 [IU] via SUBCUTANEOUS
  Administered 2020-06-19: 2 [IU] via SUBCUTANEOUS
  Administered 2020-06-20 (×2): 1 [IU] via SUBCUTANEOUS

## 2020-06-15 MED ORDER — PROMETHAZINE HCL 25 MG/ML IJ SOLN
12.5000 mg | Freq: Once | INTRAMUSCULAR | Status: AC
  Administered 2020-06-15: 12.5 mg via INTRAVENOUS
  Filled 2020-06-15: qty 1

## 2020-06-15 MED ORDER — POTASSIUM CHLORIDE 10 MEQ/100ML IV SOLN
10.0000 meq | INTRAVENOUS | Status: AC
  Administered 2020-06-15 (×3): 10 meq via INTRAVENOUS
  Filled 2020-06-15: qty 100

## 2020-06-15 MED ORDER — ACETAMINOPHEN 325 MG PO TABS: 650.0000 mg | ORAL_TABLET | Freq: Four times a day (QID) | ORAL | Status: DC | PRN

## 2020-06-15 MED ORDER — HYDROCORTISONE NA SUCCINATE PF 100 MG IJ SOLR
100.0000 mg | Freq: Once | INTRAMUSCULAR | Status: AC
  Administered 2020-06-15: 100 mg via INTRAVENOUS
  Filled 2020-06-15: qty 2

## 2020-06-15 MED ORDER — METHYLPREDNISOLONE 16 MG PO TABS
16.0000 mg | ORAL_TABLET | Freq: Two times a day (BID) | ORAL | Status: DC
  Administered 2020-06-15: 16 mg via ORAL
  Filled 2020-06-15 (×2): qty 1

## 2020-06-15 MED ORDER — SODIUM CHLORIDE 0.9 % IV SOLN: 1.0000 g | INTRAVENOUS | Status: DC

## 2020-06-15 MED ORDER — ENOXAPARIN SODIUM 40 MG/0.4ML ~~LOC~~ SOLN
40.0000 mg | SUBCUTANEOUS | Status: DC
  Administered 2020-06-16 – 2020-06-21 (×6): 40 mg via SUBCUTANEOUS
  Filled 2020-06-15 (×6): qty 0.4

## 2020-06-15 MED ORDER — ONDANSETRON HCL 4 MG/2ML IJ SOLN
INTRAMUSCULAR | Status: AC
  Administered 2020-06-15: 4 mg via INTRAVENOUS
  Filled 2020-06-15: qty 2

## 2020-06-15 MED ORDER — POTASSIUM CHLORIDE CRYS ER 20 MEQ PO TBCR
20.0000 meq | EXTENDED_RELEASE_TABLET | Freq: Once | ORAL | Status: AC
  Administered 2020-06-15: 20 meq via ORAL
  Filled 2020-06-15: qty 1

## 2020-06-15 MED ORDER — SODIUM CHLORIDE 0.9 % IV SOLN
1.0000 g | Freq: Once | INTRAVENOUS | Status: AC
  Administered 2020-06-15: 1 g via INTRAVENOUS
  Filled 2020-06-15: qty 10

## 2020-06-15 MED ORDER — SENNOSIDES-DOCUSATE SODIUM 8.6-50 MG PO TABS: 1.0000 | ORAL_TABLET | Freq: Every evening | ORAL | Status: DC | PRN

## 2020-06-15 MED ORDER — MORPHINE SULFATE (PF) 4 MG/ML IV SOLN
4.0000 mg | Freq: Once | INTRAVENOUS | Status: AC
  Administered 2020-06-15: 4 mg via INTRAVENOUS
  Filled 2020-06-15: qty 1

## 2020-06-15 MED ORDER — ACETAMINOPHEN 650 MG RE SUPP: 650.0000 mg | Freq: Four times a day (QID) | RECTAL | Status: DC | PRN

## 2020-06-15 MED ORDER — ACETAMINOPHEN 325 MG PO TABS
650.0000 mg | ORAL_TABLET | Freq: Four times a day (QID) | ORAL | Status: DC | PRN
  Administered 2020-06-15: 650 mg via ORAL
  Filled 2020-06-15: qty 2

## 2020-06-15 MED ORDER — SODIUM CHLORIDE 0.9% FLUSH
3.0000 mL | Freq: Two times a day (BID) | INTRAVENOUS | Status: DC
  Administered 2020-06-16 – 2020-06-20 (×5): 3 mL via INTRAVENOUS

## 2020-06-15 MED ORDER — ONDANSETRON HCL 4 MG/2ML IJ SOLN
4.0000 mg | Freq: Four times a day (QID) | INTRAMUSCULAR | Status: DC | PRN
  Administered 2020-06-15: 4 mg via INTRAVENOUS
  Filled 2020-06-15: qty 2

## 2020-06-15 MED ORDER — ONDANSETRON HCL 4 MG/2ML IJ SOLN: 4.0000 mg | Freq: Once | INTRAMUSCULAR | Status: AC

## 2020-06-15 MED ORDER — SODIUM CHLORIDE 0.9 % IV BOLUS (SEPSIS)
500.0000 mL | Freq: Once | INTRAVENOUS | Status: AC
  Administered 2020-06-15: 500 mL via INTRAVENOUS

## 2020-06-15 MED ORDER — ONDANSETRON HCL 4 MG PO TABS: 4.0000 mg | ORAL_TABLET | Freq: Four times a day (QID) | ORAL | Status: DC | PRN

## 2020-06-15 MED ORDER — ONDANSETRON HCL 4 MG/2ML IJ SOLN
4.0000 mg | Freq: Four times a day (QID) | INTRAMUSCULAR | Status: DC | PRN
  Administered 2020-06-16 – 2020-06-19 (×6): 4 mg via INTRAVENOUS
  Filled 2020-06-15 (×9): qty 2

## 2020-06-15 NOTE — ED Notes (Signed)
Patient keeps saying, "I am hot, I feel terrible, and I am dying."  Patient does not want to elaborate as to what she felt.  Husband at bedside.

## 2020-06-15 NOTE — Treatment Plan (Addendum)
79 yo with hx adrenal insufficiency, CKD III, type II DM, sarcoidosis, and multiple other medical problems presenting with what sounds like general malaise, fatigue, weakness and altered mental status.  Per EDP note/discussion, yesterday she felt like she was going into shock from her adrenal insufficiency.  She's taken about 144 mg methylprednisone starting yesterday.  Came into ED, feeling poorly.  Vitals notable for normal HR, normal blood pressure, saturating well on RA.  Labs notable for hypokalemia and leukocytosis.  Urine with nitrites, trace LE, 0-5 RBC, and 0-5 WBC, but many bacteria.  Urine and blood cx pending.  Requested CXR.  Requested stress dose steroids for pt, but given significant PO steroids she's taken, recommended discussion with husband regarding timing of steroids and with pharmacy as well.  Will plan to accept to telemetry bed at [facility].  Per Dr. Idolina Primer, inappropriate gabapentin dosing may also be possible contributor.

## 2020-06-15 NOTE — H&P (Signed)
History and Physical    Sonya Goodwin:[id number] DOB: April 15, 1941 DOA: 06/15/2020  PCP: Hulan Fess, MD   Patient coming from: Home   Chief Complaint: "cortisone shock"   HPI: Sonya Goodwin is a 79 y.o. female with medical history significant for sarcoidosis, adrenal insufficiency, chronic kidney disease stage IIIa, type 2 diabetes mellitus, now presenting for evaluation of fatigue and generalized weakness.  Patient reports feeling generally weak and fatigued recently, attributed the symptoms to adrenal insufficiency, and took 36 tablets of Medrol 4 mg yesterday and then 9 tablets today.  She denies any fevers, chills, abdominal pain, flank pain, dysuria, or urinary urgency or frequency.  She denies headache or focal numbness or weakness.  She denies any lightheadedness or loss of consciousness.  [facility] ED Course: Upon arrival to the ED, patient is found to be afebrile, saturating well on room air, and with stable blood pressure.  EKG features sinus rhythm with incomplete RBBB.  Chest x-rays negative for acute cardiopulmonary disease.  Chemistry panel with potassium 3.0 and creatinine 1.11, down from 1.2 in March.  CBC with leukocytosis to 13,100.  Lactic acid is elevated to 2.3.  Blood and urine cultures were collected and the patient was treated with IV potassium, Rocephin, 100 mg IV hydrocortisone, morphine, Zofran, and Phenergan.  COVID-19 screening test was negative.  Patient was transferred to [facility] for ongoing evaluation and management.  Review of Systems:  ROS is limited by the patient's clinical condition.  Past Medical History:  Diagnosis Date  . Adrenal insufficiency ([place])   . Anemia   . Arthritis   . Barrett's esophagus   . Cancer ([place])    skin cancer, "spot on pancreas"  . CKD (chronic kidney disease)    Stage III as of 09/2013, kidney stones as well  . Deafness in right ear    wears hearing aids  . Diabetes mellitus without  complication ([place])    Type II  . Diabetic gastroparesis ([place])   . Diabetic neuropathy ([place])   . Dysphagia   . GERD (gastroesophageal reflux disease)    takes Protonix  . Glaucoma   . High cholesterol   . History of cervical dysplasia   . History of kidney stones   . Hx: UTI (urinary tract infection)   . Hypertension   . Low back pain    chronic axial- Dr. Nelva Bush  . Osteoarthritis of knee    followed by ortho, Dr. Theda Sers  . Osteopenia    bone density T score-1.8 right femoral neck, 2010, -2.0 september 2012   . PONV (postoperative nausea and vomiting)   . Sarcoidosis   . Shortness of breath   . Sleep apnea    mild  . Spongiotic dermatitis    right breast, mild inflammation on biopsy  . Thyroid nodule    f/u ultrasound done by Dr. Cyndie Chime, [place] 10/10/09 were stable, smaller , followed since 2005, neg biopsy sev years ago, no further u/s needed per Dr. Cyndie Chime    Past Surgical History:  Procedure Laterality Date  . BREAST BIOPSY Bilateral   . BREAST EXCISIONAL BIOPSY Right   . BRONCHOSCOPY  1984  . cataract surgery     both eyes  . CHOLECYSTECTOMY  1981  . COLONOSCOPY    . COLONOSCOPY WITH PROPOFOL N/A 12/19/2015   Procedure: COLONOSCOPY WITH PROPOFOL;  Surgeon: Garlan Fair, MD;  Location: [facility] ENDOSCOPY;  Service: Endoscopy;  Laterality: N/A;  . DILATION AND CURETTAGE  OF UTERUS  1965  . ESOPHAGOGASTRODUODENOSCOPY (EGD) WITH ESOPHAGEAL DILATION    . KNEE ARTHROSCOPY  2011  . LUMBAR LAMINECTOMY/DECOMPRESSION MICRODISCECTOMY N/A 11/18/2013   Procedure: DECOMPRESSION L3 - L5  2 LEVELS;  Surgeon: Melina Schools, MD;  Location: [facility];  Service: Orthopedics;  Laterality: N/A;  . ORIF DISTAL RADIUS FRACTURE  2010  . PARTIAL HYSTERECTOMY  1971     reports that she has never smoked. She has never used smokeless tobacco. She reports that she does not drink alcohol and does not use drugs.  Allergies  Allergen Reactions  . Metoclopramide Other (See Comments)     unknown  . Codeine Nausea And Vomiting    Pt reports that all opiates "make me violently ill".  . Other Itching and Rash    Food-cranberries  . Pain Patch [Menthol] Nausea And Vomiting  . Statins Other (See Comments)    Muscle wasting, attempted Crestor 10mg  biw, then decreased to qw, but still had muscle problems.  Re-challenged after off x several weeks, problem resumed.  Muscle wasting, attempted Crestor 10mg  biw, then decreased to qw, but still had muscle problems.  Re-challenged after off x several weeks, problem resumed.   . Alendronate Other (See Comments)    Stomach pain  . Aspirin Tinitus  . Fenofibrate Other (See Comments)    Arthralgias and myalgias  . Fosamax [Alendronate Sodium] Other (See Comments)    Stomach pain  . Miacalcin [Calcitonin ([place])] Other (See Comments)    unknown  . Nsaids Other (See Comments)    Avoid with CKD III  . Zetia [Ezetimibe]     Arthralgias and myalgias    Family History  Problem Relation Age of Onset  . Cancer Father   . Heart attack Brother   . Heart disease Brother   . Breast cancer Neg Hx      Prior to Admission medications   Medication Sig Start Date End Date Taking? Authorizing Provider  amitriptyline (ELAVIL) 25 MG tablet Take 25 mg by mouth at bedtime.    [provider]  aspirin EC 81 MG tablet Take 81 mg by mouth at bedtime.     [provider]  brinzolamide (AZOPT) 1 % ophthalmic suspension Place 1 drop into both eyes 2 (two) times daily.    [provider]  ergocalciferol (VITAMIN D2) 50000 UNITS capsule Take 50,000 Units by mouth once a week. Saturdays    [provider]  gabapentin (NEURONTIN) 100 MG capsule Take 100-600 mg by mouth 3 (three) times daily. 100mg  in the AM, 100mg  midday, 600mg  at bedtime.    [provider]  gabapentin (NEURONTIN) 300 MG capsule Take 300 mg by mouth 3 (three) times daily. 06/07/20   [provider]  meclizine (ANTIVERT) 25 MG tablet Take  25 mg by mouth 2 (two) times daily as needed for dizziness or nausea.     [provider]  methylPREDNISolone (MEDROL) 4 MG tablet Take up to 32 mg as needed when ill 05/10/20   [provider]  Omega-3 Fatty Acids (FISH OIL PO) Take 2 capsules by mouth daily.    [provider]  ONE TOUCH ULTRA TEST test strip Use to test your blood sugar once daily as directed 12/27/14   [provider]  pantoprazole (PROTONIX) 40 MG tablet Take 40 mg by mouth daily as needed (heartburn).     [provider]  pioglitazone (ACTOS) 15 MG tablet Take 15 mg by mouth daily.    [provider]  potassium chloride (K-DUR) 10 MEQ tablet Take 1 tablet (10 mEq total) by mouth daily. 12/15/15   Sueanne Margarita, MD  rOPINIRole (REQUIP) 1 MG tablet Take 1 mg by mouth at bedtime.    [provider]  sucralfate (CARAFATE) 1 G tablet Take 1 g by mouth 2 (two) times daily as needed (heartburn).     [provider]  traMADol (ULTRAM) 50 MG tablet Take 1 tablet (50 mg total) by mouth every 6 (six) hours as needed for moderate pain. 11/19/13   Lanae Crumbly, PA-C  triamterene-hydrochlorothiazide (MAXZIDE) 75-50 MG tablet Take 1 tablet by mouth daily.    [provider]  valsartan (DIOVAN) 40 MG tablet Take 40 mg by mouth daily.    [provider]    Physical Exam: Vitals:   06/15/20 1733 06/15/20 1734 06/15/20 1736 06/15/20 1902  BP:    (!) 126/58  Pulse: 86 86 87 84  Resp: 20 18  20   Temp:    98.5 F (36.9 C)  TempSrc:    Oral  SpO2: 95% 95% 97% 98%  Weight:    62.4 kg    Constitutional: NAD, calm  Eyes: PERTLA, lids and conjunctivae normal ENMT: Mucous membranes are moist. Posterior pharynx clear of any exudate or lesions.   Neck: normal, supple, no masses, no thyromegaly Respiratory:  no wheezing, no crackles. No accessory muscle use.  Cardiovascular: S1 & S2 heard, regular rate and rhythm. No extremity edema.   Abdomen: No  distension, no tenderness, soft. Bowel sounds active.  Musculoskeletal: no clubbing / cyanosis. No joint deformity upper and lower extremities.   Skin: no significant rashes, lesions, ulcers. Warm, dry, well-perfused. Neurologic: Gross hearing deficit. No dysarthria or aphasia. Sensation intact. Moving all extremities.   Psychiatric: Alert and oriented to person and place only. Pleasant and cooperative.    Labs and Imaging on Admission: I have personally reviewed following labs and imaging studies  CBC: Recent Labs  Lab 06/15/20 1045  WBC 13.1*  NEUTROABS 11.6*  HGB 15.6*  HCT 47.3*  MCV 94.2  PLT 564   Basic Metabolic Panel: Recent Labs  Lab 06/15/20 1045 06/15/20 1246  NA 138  --   K 3.0*  --   CL 96*  --   CO2 25  --   GLUCOSE 168*  --   BUN 24*  --   CREATININE 1.11*  --   CALCIUM 9.6  --   MG  --  1.9   GFR: CrCl cannot be calculated (Unknown ideal weight.). Liver Function Tests: Recent Labs  Lab 06/15/20 1045  AST 32  ALT 26  ALKPHOS 34*  BILITOT 0.9  PROT 7.7  ALBUMIN 4.3   Recent Labs  Lab 06/15/20 1045  LIPASE 29   No results for input(s): AMMONIA in the last 168 hours. Coagulation Profile: No results for input(s): INR, PROTIME in the last 168 hours. Cardiac Enzymes: No results for input(s): CKTOTAL, CKMB, CKMBINDEX, TROPONINI in the last 168 hours. BNP (last 3 results) No results for input(s): PROBNP in the last 8760 hours. HbA1C: No results for input(s): HGBA1C in the last 72 hours. CBG: Recent Labs  Lab 06/15/20 1031 06/15/20 2027  GLUCAP 157* 127*   Lipid Profile: No results for input(s): CHOL, HDL, LDLCALC, TRIG, CHOLHDL, LDLDIRECT in the last 72 hours. Thyroid Function Tests: No results for input(s): TSH, T4TOTAL, FREET4, T3FREE, THYROIDAB in the last 72 hours. Anemia Panel: No results for input(s): VITAMINB12, FOLATE, FERRITIN,  TIBC, IRON, RETICCTPCT in the last 72 hours. Urine analysis:    Component Value Date/Time    COLORURINE YELLOW 06/15/2020 1238   APPEARANCEUR CLEAR 06/15/2020 1238   LABSPEC <1.005 (L) 06/15/2020 1238   PHURINE 5.5 06/15/2020 1238   GLUCOSEU NEGATIVE 06/15/2020 1238   HGBUR TRACE (A) 06/15/2020 1238   BILIRUBINUR NEGATIVE 06/15/2020 1238   KETONESUR NEGATIVE 06/15/2020 1238   PROTEINUR NEGATIVE 06/15/2020 1238   NITRITE POSITIVE (A) 06/15/2020 1238   LEUKOCYTESUR TRACE (A) 06/15/2020 1238   Sepsis Labs: @LABRCNTIP (procalcitonin:4,lacticidven:4) ) Recent Results (from the past 240 hour(s))  SARS Coronavirus 2 by RT PCR (hospital order, performed in [facility] hospital lab) Nasopharyngeal Urine, Clean Catch     Status: None   Collection Time: 06/15/20 12:38 PM   Specimen: Urine, Clean Catch; Nasopharyngeal  Result Value Ref Range Status   SARS Coronavirus 2 NEGATIVE NEGATIVE Final    Comment: (NOTE) SARS-CoV-2 target nucleic acids are NOT DETECTED.  The SARS-CoV-2 RNA is generally detectable in upper and lower respiratory specimens during the acute phase of infection. The lowest concentration of SARS-CoV-2 viral copies this assay can detect is 250 copies / mL. A negative result does not preclude SARS-CoV-2 infection and should not be used as the sole basis for treatment or other patient management decisions.  A negative result may occur with improper specimen collection / handling, submission of specimen other than nasopharyngeal swab, presence of viral mutation(s) within the areas targeted by this assay, and inadequate number of viral copies (<250 copies / mL). A negative result must be combined with clinical observations, patient history, and epidemiological information.  Fact Sheet for Patients:   StrictlyIdeas.no  Fact Sheet for Healthcare Providers: BankingDealers.co.za  This test is not yet approved or  cleared by the [place] FDA and has been authorized for detection and/or diagnosis of SARS-CoV-2 by FDA  under an Emergency Use Authorization (EUA).  This EUA will remain in effect (meaning this test can be used) for the duration of the COVID-19 declaration under Section 564(b)(1) of the Act, 21 U.S.C. section 360bbb-3(b)(1), unless the authorization is terminated or revoked sooner.  Performed at [facility], [address] [place], [place] [address]      Radiological Exams on Admission: DG Chest Portable 1 View  Result Date: 06/15/2020 CLINICAL DATA:  Fatigue, weakness EXAM: PORTABLE CHEST 1 VIEW COMPARISON:  02/05/2017 FINDINGS: Single frontal view of the chest demonstrates a stable cardiac silhouette. Marked calcification of the mitral annulus. No airspace disease, effusion, or pneumothorax. IMPRESSION: 1. Stable exam, no acute process. Electronically Signed   By: Randa Ngo M.D.   On: 06/15/2020 15:03    EKG: Independently reviewed. Sinus rhythm, incomplete RBBB.   Assessment/Plan   1. Acute encephalopathy  - Presents with general weakness, fatigue, and mild confusion with unclear baseline  - Patient reports that she was inadvertently given an excessive dose of gabapentin recently but timing of this not clear, denies f/c, denies HA or focal numbness/weakness, and denies recent fall or trauma  - Unable to reach family to clarify baseline  - Possibly related to recent increase in gabapentin; UTI considered but she denies sxs and no WBC on UA - Follow-up pharmacy medication reconciliation, continue to try to reach family for additional history, supportive care, check AMS labs    2. Adrenal insufficiency  - Patient has hx of adrenal insufficiency and takes Medrol at home, was feeling generally weak and lethargic yesterday so took 36 tabs with  4 mg each and then 9 tablets this am  - She was given 100 mg IV hydrocortisone in ED  - No hypotension, abdominal pain, N/V, hypoglycemia, hyponatremia, or hyperkalemia to suggest adrenal crisis  - Plan to continue Medrol with 16 mg  BID for now and then back to usual home dose in the next couple days    3. ?UTI  - Cultures were collected in ED and patient was treated with Rocephin, though she denies any urinary symptoms, fever/chills, or flank pain, and there is no pyuria  - Hold off on further antibiotic for now, follow cultures    4. Hypokalemia  - Serum potassium was 3.0 in ED and treated with IV and oral potassium - Mag level normal, repeat chemistry panel in am   5. Type II DM  - A1c was 6.5% in October 2020  - Check CBGs and use SSI for now   6. CKD IIIa - SCr is 1.11 in ED, down from 1.24 in March 2021  - Renally-dose medications, monitor     DVT prophylaxis: Lovenox Code Status: Full  Family Communication: Attempted to call spouse but no answer; daughter left and no phone # available  Disposition Plan:  Patient is from: Home  Anticipated d/c is to: TBD Anticipated d/c date is: 06/17/20 Patient currently: Pending further workup  Consults called: None  Admission status: Inpatient     Vianne Bulls, MD [facility] Pager: See [email or web address]  If 7AM-7PM, please contact the daytime attending [email or web address]  06/15/2020, 10:45 PM

## 2020-06-15 NOTE — ED Triage Notes (Addendum)
Pt rambling during triage about cortisone shots. Unable to define why she is here. States I need to ask her husband who has left and is not answering the phone. States she doesn't know if she is going to live anymore and "nobody cares if I make it"

## 2020-06-15 NOTE — ED Provider Notes (Addendum)
[facility] EMERGENCY DEPARTMENT Provider Note   CSN: [id number] Arrival date & time: 06/15/20  1009     History Chief Complaint  Patient presents with  . Multiple complaints    Sonya Goodwin is a 79 y.o. female.  HPI      Hx of adrenal insufficiency which developed after sarcoidosis Had viewing of friend last night at funeral home and took more steroid Concerned going into cortisone shock, 4mg  tablets methylprednisolone, total was 36 tablets starting yesterday afternoon until midnight then started decreasing 18 tablets in evening, 9 tablets at midnight, 9 tablets at 730  Very weak, fatigued  Wednesday/yesterday felt like going into cortisone shock, felt very weak Nausea Urinating more but also trying to drink more Not eating anything  No chest pain, no dyspnea  Dr. Rex Kras  Gabapentin, had too many mg, then stopped it and took normal dosing.      Past Medical History:  Diagnosis Date  . Adrenal insufficiency ([place])   . Anemia   . Arthritis   . Barrett's esophagus   . Cancer ([place])    skin cancer, "spot on pancreas"  . CKD (chronic kidney disease)    Stage III as of 09/2013, kidney stones as well  . Deafness in right ear    wears hearing aids  . Diabetes mellitus without complication ([place])    Type II  . Diabetic gastroparesis ([place])   . Diabetic neuropathy ([place])   . Dysphagia   . GERD (gastroesophageal reflux disease)    takes Protonix  . Glaucoma   . High cholesterol   . History of cervical dysplasia   . History of kidney stones   . Hx: UTI (urinary tract infection)   . Hypertension   . Low back pain    chronic axial- Dr. Nelva Bush  . Osteoarthritis of knee    followed by ortho, Dr. Theda Sers  . Osteopenia    bone density T score-1.8 right femoral neck, 2010, -2.0 september 2012   . PONV (postoperative nausea and vomiting)   . Sarcoidosis   . Shortness of breath   . Sleep apnea    mild  . Spongiotic dermatitis    right breast, mild  inflammation on biopsy  . Thyroid nodule    f/u ultrasound done by Dr. Cyndie Chime, [place] 10/10/09 were stable, smaller , followed since 2005, neg biopsy sev years ago, no further u/s needed per Dr. Cyndie Chime    Patient Active Problem List   Diagnosis Date Noted  . UTI (urinary tract infection) 06/15/2020  . Dyslipidemia 11/18/2014  . Sarcoidosis   . Adrenal insufficiency ([place])   . CKD (chronic kidney disease)   . Hypertension   . Diabetes mellitus without complication [facility])     Past Surgical History:  Procedure Laterality Date  . BREAST BIOPSY Bilateral   . BREAST EXCISIONAL BIOPSY Right   . BRONCHOSCOPY  1984  . cataract surgery     both eyes  . CHOLECYSTECTOMY  1981  . COLONOSCOPY    . COLONOSCOPY WITH PROPOFOL N/A 12/19/2015   Procedure: COLONOSCOPY WITH PROPOFOL;  Surgeon: Garlan Fair, MD;  Location: [facility];  Service: Endoscopy;  Laterality: N/A;  . DILATION AND CURETTAGE OF UTERUS  1965  . ESOPHAGOGASTRODUODENOSCOPY (EGD) WITH ESOPHAGEAL DILATION    . KNEE ARTHROSCOPY  2011  . LUMBAR LAMINECTOMY/DECOMPRESSION MICRODISCECTOMY N/A 11/18/2013   Procedure: DECOMPRESSION L3 - L5  2 LEVELS;  Surgeon: Melina Schools, MD;  Location: [place];  Service: Orthopedics;  Laterality: N/A;  . ORIF DISTAL RADIUS FRACTURE  2010  . PARTIAL HYSTERECTOMY  1971     OB History   No obstetric history on file.     Family History  Problem Relation Age of Onset  . Cancer Father   . Heart attack Brother   . Heart disease Brother   . Breast cancer Neg Hx     Social History   Tobacco Use  . Smoking status: Never Smoker  . Smokeless tobacco: Never Used  Substance Use Topics  . Alcohol use: No  . Drug use: No    Home Medications Prior to Admission medications   Medication Sig Start Date End Date Taking? Authorizing Provider  amitriptyline (ELAVIL) 25 MG tablet Take 25 mg by mouth at bedtime.    [provider]  aspirin EC 81 MG tablet Take 81 mg by mouth  at bedtime.     [provider]  brinzolamide (AZOPT) 1 % ophthalmic suspension Place 1 drop into both eyes 2 (two) times daily.    [provider]  ergocalciferol (VITAMIN D2) 50000 UNITS capsule Take 50,000 Units by mouth once a week. Saturdays    [provider]  gabapentin (NEURONTIN) 100 MG capsule Take 100-600 mg by mouth 3 (three) times daily. 100mg  in the AM, 100mg  midday, 600mg  at bedtime.    [provider]  gabapentin (NEURONTIN) 300 MG capsule Take 300 mg by mouth 3 (three) times daily. 06/07/20   [provider]  meclizine (ANTIVERT) 25 MG tablet Take 25 mg by mouth 2 (two) times daily as needed for dizziness or nausea.     [provider]  methylPREDNISolone (MEDROL) 4 MG tablet Take up to 32 mg as needed when ill 05/10/20   [provider]  Omega-3 Fatty Acids (FISH OIL PO) Take 2 capsules by mouth daily.    [provider]  ONE TOUCH ULTRA TEST test strip Use to test your blood sugar once daily as directed 12/27/14   [provider]  pantoprazole (PROTONIX) 40 MG tablet Take 40 mg by mouth daily as needed (heartburn).     [provider]  pioglitazone (ACTOS) 15 MG tablet Take 15 mg by mouth daily.    [provider]  potassium chloride (K-DUR) 10 MEQ tablet Take 1 tablet (10 mEq total) by mouth daily. 12/15/15   Sueanne Margarita, MD  rOPINIRole (REQUIP) 1 MG tablet Take 1 mg by mouth at bedtime.    [provider]  sucralfate (CARAFATE) 1 G tablet Take 1 g by mouth 2 (two) times daily as needed (heartburn).     [provider]  traMADol (ULTRAM) 50 MG tablet Take 1 tablet (50 mg total) by mouth every 6 (six) hours as needed for moderate pain. 11/19/13   Lanae Crumbly, PA-C  triamterene-hydrochlorothiazide (MAXZIDE) 75-50 MG tablet Take 1 tablet by mouth daily.    [provider]  valsartan (DIOVAN) 40 MG tablet Take 40 mg by mouth daily.    [provider]    Allergies    Metoclopramide, Codeine, Other, Pain patch [menthol], Statins, Alendronate, Aspirin, Fenofibrate, Fosamax [alendronate sodium], Miacalcin [calcitonin (sal[place])], Nsaids, and Zetia [ezetimibe]  Review of Systems   Review of Systems  Constitutional: Positive for appetite change and fatigue. Negative for fever.  HENT: Negative for sore throat.   Eyes: Negative for visual disturbance.  Respiratory: Negative for cough and shortness of breath.   Cardiovascular: Negative for chest pain.  Gastrointestinal: Positive  for nausea. Negative for abdominal pain, constipation, diarrhea and vomiting.  Genitourinary: Positive for frequency. Negative for difficulty urinating and dysuria.  Musculoskeletal: Negative for back pain and neck pain.  Skin: Negative for rash.  Neurological: Negative for syncope and headaches. Numbness: tingling bilateral arms unchanged.    Physical Exam Updated Vital Signs BP (!) 157/77 (BP Location: Right Arm)   Pulse 93   Temp 97.9 F (36.6 C) (Oral)   Resp 19   SpO2 96%   Physical Exam Vitals and nursing note reviewed.  Constitutional:      General: She is not in acute distress.    Appearance: She is well-developed. She is not diaphoretic.  HENT:     Head: Normocephalic and atraumatic.  Eyes:     Conjunctiva/sclera: Conjunctivae normal.  Cardiovascular:     Rate and Rhythm: Normal rate and regular rhythm.     Heart sounds: Normal heart sounds. No murmur heard.  No friction rub. No gallop.   Pulmonary:     Effort: Pulmonary effort is normal. No respiratory distress.     Breath sounds: Normal breath sounds. No wheezing or rales.  Abdominal:     General: There is no distension.     Palpations: Abdomen is soft.     Tenderness: There is no abdominal tenderness. There is no guarding.  Musculoskeletal:        General: No tenderness.     Cervical back: Normal range of motion.  Skin:    General: Skin is warm and dry.     Findings: No erythema  or rash.  Neurological:     Mental Status: She is alert.     Comments: Oriented to self, location     ED Results / Procedures / Treatments   Labs (all labs ordered are listed, but only abnormal results are displayed) Labs Reviewed  CBC WITH DIFFERENTIAL/PLATELET - Abnormal; Notable for the following components:      Result Value   WBC 13.1 (*)    Hemoglobin 15.6 (*)    HCT 47.3 (*)    Neutro Abs 11.6 (*)    Abs Immature Granulocytes 0.12 (*)    All other components within normal limits  COMPREHENSIVE METABOLIC PANEL - Abnormal; Notable for the following components:   Potassium 3.0 (*)    Chloride 96 (*)    Glucose, Bld 168 (*)    BUN 24 (*)    Creatinine, Ser 1.11 (*)    Alkaline Phosphatase 34 (*)    GFR calc non Af Amer 47 (*)    GFR calc Af Amer 55 (*)    Anion gap 17 (*)    All other components within normal limits  URINALYSIS, ROUTINE W REFLEX MICROSCOPIC - Abnormal; Notable for the following components:   Specific Gravity, Urine <1.005 (*)    Hgb urine dipstick TRACE (*)    Nitrite POSITIVE (*)    Leukocytes,Ua TRACE (*)    All other components within normal limits  URINALYSIS, MICROSCOPIC (REFLEX) - Abnormal; Notable for the following components:   Bacteria, UA MANY (*)    All other components within normal limits  LACTIC ACID, PLASMA - Abnormal; Notable for the following components:   Lactic Acid, Venous 2.3 (*)    All other components within normal limits  CBG MONITORING, ED - Abnormal; Notable for the following components:   Glucose-Capillary 157 (*)    All other components within normal limits  SARS CORONAVIRUS 2 BY RT PCR (HOSPITAL ORDER, [place]  [facility] LAB)  URINE CULTURE  CULTURE, BLOOD (ROUTINE X 2)  CULTURE, BLOOD (ROUTINE X 2)  LIPASE, BLOOD  MAGNESIUM  LACTIC ACID, PLASMA    EKG None  Radiology No results found.  Procedures Procedures (including critical care time)  Medications Ordered in ED Medications  potassium  chloride 10 mEq in 100 mL IVPB (10 mEq Intravenous New Bag/Given 06/15/20 1432)  cefTRIAXone (ROCEPHIN) 1 g in sodium chloride 0.9 % 100 mL IVPB (1 g Intravenous New Bag/Given 06/15/20 1441)  ondansetron (ZOFRAN) injection 4 mg (4 mg Intravenous Given 06/15/20 1049)  hydrocortisone sodium succinate (SOLU-CORTEF) 100 MG injection 100 mg (100 mg Intravenous Given 06/15/20 1430)    ED Course  I have reviewed the triage vital signs and the nursing notes.  Pertinent labs & imaging results that were available during my care of the patient were reviewed by me and considered in my medical decision making (see chart for details).    MDM Rules/Calculators/A&P                           79yo female with history of adrenal insufficiency, DM, sarcoidosis, CKD, presents with concern for severe generalized weakness, fatigue, and reporting that she is" cortisone shock" and has taken 36 of her 4 mg methylprednisolone tablets for stress dose steroids at home.  Labs show WBC 13.1, many bacteria and nitrites although 0-5 WBC and she does report urinary frequency.  She is sleepy, somewhat confused about the day and timing.  Daughter does report she has some baseline memory problems, but her generalized weakness at this time is new.  Suspect encephalopathy secondary to urinary tract infection.  Blood cx and rocephin ordered.  She has taken 144mg  of methylprednisolone at home however only 36mg  this AM. Will order 100mg  hydrocortisone for stress dose steroids.    Daughter also reports that the patient was inadvertently given a larger dose of gabapentin by the pharmacist 2 days ago--when on initial history, the patient reported this was last week.  This may also be a contributor to her severe fatigue and generalized weakness.  Given severe generalized weakness,  will admit for further care.    Final Clinical Impression(s) / ED Diagnoses Final diagnoses:  Generalized weakness  Urinary tract infection without hematuria,  site unspecified  Adrenal insufficiency [facility])    Rx / DC Orders ED Discharge Orders    None       Gareth Morgan, MD 06/15/20 1455    Gareth Morgan, MD 06/15/20 [phone]

## 2020-06-15 NOTE — ED Notes (Signed)
Pt states she took 9 of her cortisone pills today and took 13 of her cortisone pills yesterday.  She states that she needs them to live.  Husband gives them to her, he states she takes more when she feels bad.

## 2020-06-15 NOTE — ED Notes (Signed)
Dr Billy Fischer in to speak to daughter and pt

## 2020-06-15 NOTE — ED Notes (Signed)
carelink here daughter to meet her mom at [facility]

## 2020-06-15 NOTE — ED Notes (Signed)
Pt placed on bed pan, unable to urinate.

## 2020-06-15 NOTE — ED Notes (Signed)
Dr Billy Fischer into speak to pts husband , he was made awar that pt is going to be addmitted

## 2020-06-16 DIAGNOSIS — E876 Hypokalemia: Secondary | ICD-10-CM

## 2020-06-16 DIAGNOSIS — G9340 Encephalopathy, unspecified: Secondary | ICD-10-CM

## 2020-06-16 DIAGNOSIS — E1121 Type 2 diabetes mellitus with diabetic nephropathy: Secondary | ICD-10-CM

## 2020-06-16 DIAGNOSIS — N1831 Chronic kidney disease, stage 3a: Secondary | ICD-10-CM

## 2020-06-16 LAB — GLUCOSE, CAPILLARY
Glucose-Capillary: 148 mg/dL — ABNORMAL HIGH (ref 70–99)
Glucose-Capillary: 164 mg/dL — ABNORMAL HIGH (ref 70–99)
Glucose-Capillary: 126 mg/dL — ABNORMAL HIGH (ref 70–99)
Glucose-Capillary: 108 mg/dL — ABNORMAL HIGH (ref 70–99)
Glucose-Capillary: 103 mg/dL — ABNORMAL HIGH (ref 70–99)

## 2020-06-16 LAB — CBC
HCT: 41.5 % (ref 36.0–46.0)
Hemoglobin: 13.3 g/dL (ref 12.0–15.0)
MCH: 31.2 pg (ref 26.0–34.0)
MCHC: 32 g/dL (ref 30.0–36.0)
MCV: 97.4 fL (ref 80.0–100.0)
Platelets: 129 10*3/uL — ABNORMAL LOW (ref 150–400)
RBC: 4.26 MIL/uL (ref 3.87–5.11)
RDW: 14.6 % (ref 11.5–15.5)
WBC: 12.5 10*3/uL — ABNORMAL HIGH (ref 4.0–10.5)
nRBC: 0 % (ref 0.0–0.2)

## 2020-06-16 LAB — BASIC METABOLIC PANEL
Anion gap: 12 (ref 5–15)
BUN: 25 mg/dL — ABNORMAL HIGH (ref 8–23)
CO2: 24 mmol/L (ref 22–32)
Calcium: 8.7 mg/dL — ABNORMAL LOW (ref 8.9–10.3)
Chloride: 106 mmol/L (ref 98–111)
Creatinine, Ser: 1.16 mg/dL — ABNORMAL HIGH (ref 0.44–1.00)
GFR calc Af Amer: 52 mL/min — ABNORMAL LOW (ref >60)
GFR calc non Af Amer: 45 mL/min — ABNORMAL LOW (ref >60)
Glucose, Bld: 137 mg/dL — ABNORMAL HIGH (ref 70–99)
Potassium: 3.8 mmol/L (ref 3.5–5.1)
Sodium: 142 mmol/L (ref 135–145)

## 2020-06-16 LAB — VITAMIN B12: Vitamin B-12: 153 pg/mL — ABNORMAL LOW (ref 180–914)

## 2020-06-16 LAB — HEMOGLOBIN A1C
Hgb A1c MFr Bld: 6.3 % — ABNORMAL HIGH (ref 4.8–5.6)
Mean Plasma Glucose: 134 mg/dL

## 2020-06-16 LAB — URINE CULTURE: Culture: >=100000 — AB

## 2020-06-16 LAB — RPR: RPR Ser Ql: NONREACTIVE

## 2020-06-16 LAB — TSH: TSH: 0.568 u[IU]/mL (ref 0.350–4.500)

## 2020-06-16 LAB — AMMONIA: Ammonia: 15 umol/L (ref 9–35)

## 2020-06-16 MED ORDER — TRAMADOL HCL 50 MG PO TABS: 50.0000 mg | ORAL_TABLET | Freq: Four times a day (QID) | ORAL | Status: DC | PRN

## 2020-06-16 MED ORDER — MELATONIN 5 MG PO TABS
5.0000 mg | ORAL_TABLET | Freq: Every evening | ORAL | Status: DC | PRN
  Administered 2020-06-16: 5 mg via ORAL
  Filled 2020-06-16: qty 1

## 2020-06-16 MED ORDER — SUCRALFATE 1 G PO TABS
1.0000 g | ORAL_TABLET | Freq: Two times a day (BID) | ORAL | Status: DC | PRN
  Administered 2020-06-18 – 2020-06-19 (×2): 1 g via ORAL
  Filled 2020-06-16 (×2): qty 1

## 2020-06-16 MED ORDER — METHYLPREDNISOLONE 4 MG PO TABS
8.0000 mg | ORAL_TABLET | Freq: Two times a day (BID) | ORAL | Status: DC
  Administered 2020-06-16 – 2020-06-20 (×9): 8 mg via ORAL
  Filled 2020-06-16 (×10): qty 2

## 2020-06-16 MED ORDER — ROPINIROLE HCL 1 MG PO TABS
0.5000 mg | ORAL_TABLET | Freq: Every day | ORAL | Status: DC
  Administered 2020-06-16 (×2): 0.5 mg via ORAL
  Filled 2020-06-16 (×2): qty 1

## 2020-06-16 MED ORDER — POTASSIUM CHLORIDE CRYS ER 10 MEQ PO TBCR
10.0000 meq | EXTENDED_RELEASE_TABLET | Freq: Every day | ORAL | Status: DC
  Administered 2020-06-16 – 2020-06-19 (×4): 10 meq via ORAL
  Filled 2020-06-16 (×5): qty 1

## 2020-06-16 MED ORDER — PROMETHAZINE HCL 25 MG/ML IJ SOLN
12.5000 mg | INTRAMUSCULAR | Status: AC | PRN
  Administered 2020-06-16: 12.5 mg via INTRAVENOUS
  Filled 2020-06-16: qty 1

## 2020-06-16 MED ORDER — BRINZOLAMIDE 1 % OP SUSP
1.0000 [drp] | Freq: Two times a day (BID) | OPHTHALMIC | Status: DC
  Administered 2020-06-16 – 2020-06-21 (×9): 1 [drp] via OPHTHALMIC
  Filled 2020-06-16 (×2): qty 10

## 2020-06-16 MED ORDER — ROPINIROLE HCL 1 MG PO TABS
0.5000 mg | ORAL_TABLET | Freq: Once | ORAL | Status: AC
  Administered 2020-06-16: 0.5 mg via ORAL
  Filled 2020-06-16 (×2): qty 1

## 2020-06-16 MED ORDER — ONDANSETRON HCL 4 MG/2ML IJ SOLN
4.0000 mg | Freq: Once | INTRAMUSCULAR | Status: AC
  Administered 2020-06-18: 4 mg via INTRAVENOUS
  Filled 2020-06-16: qty 2

## 2020-06-16 MED ORDER — CYANOCOBALAMIN 1000 MCG/ML IJ SOLN
1000.0000 ug | Freq: Every day | INTRAMUSCULAR | Status: DC
  Administered 2020-06-16 – 2020-06-20 (×5): 1000 ug via INTRAMUSCULAR
  Filled 2020-06-16 (×6): qty 1

## 2020-06-16 MED ORDER — KETOROLAC TROMETHAMINE 30 MG/ML IJ SOLN
15.0000 mg | Freq: Four times a day (QID) | INTRAMUSCULAR | Status: DC | PRN
  Administered 2020-06-16: 15 mg via INTRAVENOUS
  Filled 2020-06-16 (×2): qty 1

## 2020-06-16 MED ORDER — ASPIRIN EC 81 MG PO TBEC
81.0000 mg | DELAYED_RELEASE_TABLET | Freq: Every day | ORAL | Status: DC
  Administered 2020-06-16 – 2020-06-20 (×5): 81 mg via ORAL
  Filled 2020-06-16 (×5): qty 1

## 2020-06-16 MED ORDER — KETOROLAC TROMETHAMINE 30 MG/ML IJ SOLN: 30.0000 mg | Freq: Four times a day (QID) | INTRAMUSCULAR | Status: DC | PRN

## 2020-06-16 MED ORDER — PANTOPRAZOLE SODIUM 40 MG PO TBEC
40.0000 mg | DELAYED_RELEASE_TABLET | Freq: Every day | ORAL | Status: DC | PRN
  Administered 2020-06-18: 40 mg via ORAL
  Filled 2020-06-16: qty 1

## 2020-06-16 NOTE — Plan of Care (Signed)
  Problem: Education: Goal: Knowledge of General Education information will improve Description: Including pain rating scale, medication(s)/side effects and non-pharmacologic comfort measures Outcome: Progressing   Problem: Activity: Goal: Risk for activity intolerance will decrease Outcome: Progressing   

## 2020-06-16 NOTE — Progress Notes (Signed)
[facility] PROGRESS NOTE    Progress Note  Sonya Goodwin  DGU:[id number] DOB: 15-Jun-1941 DOA: 06/15/2020 PCP: Hulan Fess, MD     Brief Narrative:   Sonya Goodwin is an 79 y.o. female Street significant for sarcoidosis, adrenal insufficiency, chronic kidney disease stage III diabetes mellitus type 2 presents for evaluation of nauseated,fatigue and generalized weakness he was feeling weak so she took prior to admission about 4 tablets of Medrol at home they were 4 mg and 9 on the day of admission  Assessment/Plan:   Acute metabolic encephalopathy: Of unclear etiology, likely due to medication, the family members related that her Neurontin dosing and scheduling was wrong and she might of took inadvertently extra doses of Neurontin.   Stable she has a mild leukocytosis of 12.5 lactic acid of 2.3, but is currently afebrile. It is to note that her B12 is 153 TSH 0.5 UA shows many bacteria, 0-5 white blood cells.  Chest x-ray showed no acute findings. Consult physical therapy. I have asked the family to bring of medication list will try to obtain her medication list from her PCP and compare it  Possible UTI: Urine cultures were collected in the ED although she seems to be asymptomatic, got a single dose of IV Rocephin in the ED, urine cultures are pending.  Vitamin B12 deficiency: Is 153 will start on intramuscular B12 daily.  Hypokalemia: Treated with oral potassium, it is improved this morning, her magnesium is 1.9.  Diabetes mellitus type 2: With an A1c of 6.5 continue sliding scale insulin.  Chronic kidney disease stage IIIa: Creatinine seems to be at baseline.    DVT prophylaxis: lovenox Family Communication:husband Status is: Inpatient  Remains inpatient appropriate because:Hemodynamically unstable   Dispo: The patient is from: Home              Anticipated d/c is to: Home              Anticipated d/c date is: 2 days              Patient currently  is not medically stable to d/c.        Code Status:     Code Status Orders  (From admission, onward)         Start     Ordered   06/15/20 2204  Full code  Continuous        06/15/20 2207        Code Status History    Date Active Date Inactive Code Status Order ID Comments User Context   11/18/2013 1828 11/21/2013 1603 Full Code [id number]  Melina Schools, MD Inpatient   Advance Care Planning Activity        IV Access:    Peripheral IV   Procedures and diagnostic studies:   DG Chest Portable 1 View  Result Date: 06/15/2020 CLINICAL DATA:  Fatigue, weakness EXAM: PORTABLE CHEST 1 VIEW COMPARISON:  02/05/2017 FINDINGS: Single frontal view of the chest demonstrates a stable cardiac silhouette. Marked calcification of the mitral annulus. No airspace disease, effusion, or pneumothorax. IMPRESSION: 1. Stable exam, no acute process. Electronically Signed   By: Randa Ngo M.D.   On: 06/15/2020 15:03     Medical Consultants:    None.  Anti-Infectives:   none  Subjective:    Sonya Goodwin relates she feels better, hungry.  Objective:    Vitals:   06/15/20 2339 06/16/20 0300 06/16/20 0420 06/16/20 0714  BP: (!) 114/54 (!) 110/46  Marland Kitchen)  139/54  Pulse: 86 83  86  Resp: 18 18  18   Temp: 98 F (36.7 C) 98.2 F (36.8 C)  98.9 F (37.2 C)  TempSrc: Oral Oral  Oral  SpO2: 97% 98%  99%  Weight:   64.5 kg    SpO2: 99 %   Intake/Output Summary (Last 24 hours) at 06/16/2020 0721 Last data filed at 06/16/2020 0640 Gross per 24 hour  Intake 980.23 ml  Output 150 ml  Net 830.23 ml   Filed Weights   06/15/20 1902 06/16/20 0420  Weight: 62.4 kg 64.5 kg    Exam: General exam: In no acute distress. Respiratory system: Good air movement and clear to auscultation. Cardiovascular system: S1 & S2 heard, RRR. No JVD. Gastrointestinal system: Abdomen is nondistended, soft and nontender.  Extremities: No pedal edema. Skin: No rashes, lesions or  ulcers Psychiatry: Judgement and insight appear normal.    Data Reviewed:    Labs: Basic Metabolic Panel: Recent Labs  Lab 06/15/20 1045 06/15/20 1246 06/16/20 0421  NA 138  --  142  K 3.0*  --  3.8  CL 96*  --  106  CO2 25  --  24  GLUCOSE 168*  --  137*  BUN 24*  --  25*  CREATININE 1.11*  --  1.16*  CALCIUM 9.6  --  8.7*  MG  --  1.9  --    GFR CrCl cannot be calculated (Unknown ideal weight.). Liver Function Tests: Recent Labs  Lab 06/15/20 1045  AST 32  ALT 26  ALKPHOS 34*  BILITOT 0.9  PROT 7.7  ALBUMIN 4.3   Recent Labs  Lab 06/15/20 1045  LIPASE 29   Recent Labs  Lab 06/16/20 0421  AMMONIA 15   Coagulation profile No results for input(s): INR, PROTIME in the last 168 hours. COVID-19 Labs  No results for input(s): DDIMER, FERRITIN, LDH, CRP in the last 72 hours.  Lab Results  Component Value Date   [place] NEGATIVE 06/15/2020    CBC: Recent Labs  Lab 06/15/20 1045 06/16/20 0421  WBC 13.1* 12.5*  NEUTROABS 11.6*  --   HGB 15.6* 13.3  HCT 47.3* 41.5  MCV 94.2 97.4  PLT 210 129*   Cardiac Enzymes: No results for input(s): CKTOTAL, CKMB, CKMBINDEX, TROPONINI in the last 168 hours. BNP (last 3 results) No results for input(s): PROBNP in the last 8760 hours. CBG: Recent Labs  Lab 06/15/20 1031 06/15/20 2027 06/16/20 0421 06/16/20 0624  GLUCAP 157* 127* 148* 164*   D-Dimer: No results for input(s): DDIMER in the last 72 hours. Hgb A1c: No results for input(s): HGBA1C in the last 72 hours. Lipid Profile: No results for input(s): CHOL, HDL, LDLCALC, TRIG, CHOLHDL, LDLDIRECT in the last 72 hours. Thyroid function studies: Recent Labs    06/16/20 0421  TSH 0.568   Anemia work up: Recent Labs    06/16/20 0421  VITAMINB12 153*   Sepsis Labs: Recent Labs  Lab 06/15/20 1045 06/15/20 1355 06/16/20 0421  WBC 13.1*  --  12.5*  LATICACIDVEN  --  2.3*  --    Microbiology Recent Results (from the past 240 hour(s))   SARS Coronavirus 2 by RT PCR (hospital order, performed in [facility] hospital lab) Nasopharyngeal Urine, Clean Catch     Status: None   Collection Time: 06/15/20 12:38 PM   Specimen: Urine, Clean Catch; Nasopharyngeal  Result Value Ref Range Status   SARS Coronavirus 2 NEGATIVE NEGATIVE Final    Comment: (NOTE)  SARS-CoV-2 target nucleic acids are NOT DETECTED.  The SARS-CoV-2 RNA is generally detectable in upper and lower respiratory specimens during the acute phase of infection. The lowest concentration of SARS-CoV-2 viral copies this assay can detect is 250 copies / mL. A negative result does not preclude SARS-CoV-2 infection and should not be used as the sole basis for treatment or other patient management decisions.  A negative result may occur with improper specimen collection / handling, submission of specimen other than nasopharyngeal swab, presence of viral mutation(s) within the areas targeted by this assay, and inadequate number of viral copies (<250 copies / mL). A negative result must be combined with clinical observations, patient history, and epidemiological information.  Fact Sheet for Patients:   StrictlyIdeas.no  Fact Sheet for Healthcare Providers: BankingDealers.co.za  This test is not yet approved or  cleared by the [place] FDA and has been authorized for detection and/or diagnosis of SARS-CoV-2 by FDA under an Emergency Use Authorization (EUA).  This EUA will remain in effect (meaning this test can be used) for the duration of the COVID-19 declaration under Section 564(b)(1) of the Act, 21 U.S.C. section 360bbb-3(b)(1), unless the authorization is terminated or revoked sooner.  Performed at [facility], [address] [place], [place] [address]      Medications:   . enoxaparin (LOVENOX) injection  40 mg Subcutaneous Q24H  . insulin aspart  0-9 Units Subcutaneous TID WC  .  methylPREDNISolone  16 mg Oral BID  . rOPINIRole  0.5 mg Oral QHS  . sodium chloride flush  3 mL Intravenous Q12H   Continuous Infusions: . sodium chloride 75 mL/hr at 06/15/20 2236      LOS: 1 day   Charlynne Cousins  [facility]  06/16/2020, 7:21 AM

## 2020-06-16 NOTE — Progress Notes (Signed)
PT Cancellation Note  Patient Details Name: Sonya Goodwin MRN: [id number] DOB: 02-Jan-1941   Cancelled Treatment:    Reason Eval/Treat Not Completed: Medical issues which prohibited therapy;Other (comment).  Nursing reports she is having RLS, feeling sick earlier.  Has had too much steroid prior to admission, now unable to do therapy today.  Daughter is asking to be present when PT sees her, left her number with this PT:  Lady Saucier, [phone].  Let daughter know PT could make the best effort but may not be able to mesh with daughter's visit schedule tomorrow.   Ramond Dial 06/16/2020, 5:00 PM   Mee Hives, PT MS Acute Rehab Dept. Number: [place]

## 2020-06-16 NOTE — Progress Notes (Signed)
Patient complaining of nausea and restless leg. Zofran given to patient as ordered. MD called and made aware of patient complaint of restless legs. MD stated that we could give the scheduled dose of Requip early. Orders followed. Patient and family made aware of this. Will continue to monitor.

## 2020-06-16 NOTE — Progress Notes (Signed)
Patient stated was having Restless legs and needing something for it.  Patient had nothing ordered for it. Called Physician to see if something could be ordered and was told I needed to check Med Rec before calling him. No further orders given.

## 2020-06-17 DIAGNOSIS — F05 Delirium due to known physiological condition: Secondary | ICD-10-CM

## 2020-06-17 LAB — CBC
HCT: 45.6 % (ref 36.0–46.0)
Hemoglobin: 14.3 g/dL (ref 12.0–15.0)
MCH: 30.5 pg (ref 26.0–34.0)
MCHC: 31.4 g/dL (ref 30.0–36.0)
MCV: 97.2 fL (ref 80.0–100.0)
Platelets: 171 10*3/uL (ref 150–400)
RBC: 4.69 MIL/uL (ref 3.87–5.11)
RDW: 14.7 % (ref 11.5–15.5)
WBC: 11.9 10*3/uL — ABNORMAL HIGH (ref 4.0–10.5)
nRBC: 0 % (ref 0.0–0.2)

## 2020-06-17 LAB — BASIC METABOLIC PANEL
Anion gap: 13 (ref 5–15)
BUN: 28 mg/dL — ABNORMAL HIGH (ref 8–23)
CO2: 22 mmol/L (ref 22–32)
Calcium: 9.2 mg/dL (ref 8.9–10.3)
Chloride: 108 mmol/L (ref 98–111)
Creatinine, Ser: 1.16 mg/dL — ABNORMAL HIGH (ref 0.44–1.00)
GFR calc Af Amer: 52 mL/min — ABNORMAL LOW (ref >60)
GFR calc non Af Amer: 45 mL/min — ABNORMAL LOW (ref >60)
Glucose, Bld: 133 mg/dL — ABNORMAL HIGH (ref 70–99)
Potassium: 3.6 mmol/L (ref 3.5–5.1)
Sodium: 143 mmol/L (ref 135–145)

## 2020-06-17 LAB — GLUCOSE, CAPILLARY
Glucose-Capillary: 150 mg/dL — ABNORMAL HIGH (ref 70–99)
Glucose-Capillary: 140 mg/dL — ABNORMAL HIGH (ref 70–99)
Glucose-Capillary: 105 mg/dL — ABNORMAL HIGH (ref 70–99)
Glucose-Capillary: 122 mg/dL — ABNORMAL HIGH (ref 70–99)

## 2020-06-17 MED ORDER — QUETIAPINE FUMARATE 25 MG PO TABS
25.0000 mg | ORAL_TABLET | Freq: Every evening | ORAL | Status: DC | PRN
  Administered 2020-06-18: 25 mg via ORAL
  Filled 2020-06-17: qty 1

## 2020-06-17 MED ORDER — LORAZEPAM 2 MG/ML IJ SOLN
1.0000 mg | Freq: Once | INTRAMUSCULAR | Status: DC
  Filled 2020-06-17: qty 1

## 2020-06-17 MED ORDER — GABAPENTIN 600 MG PO TABS
600.0000 mg | ORAL_TABLET | Freq: Every day | ORAL | Status: DC
  Administered 2020-06-17 – 2020-06-18 (×2): 600 mg via ORAL
  Filled 2020-06-17 (×2): qty 1

## 2020-06-17 MED ORDER — HALOPERIDOL LACTATE 5 MG/ML IJ SOLN: 2.0000 mg | Freq: Once | INTRAMUSCULAR | Status: DC

## 2020-06-17 MED ORDER — HALOPERIDOL 1 MG PO TABS
2.0000 mg | ORAL_TABLET | Freq: Once | ORAL | Status: AC
  Filled 2020-06-17: qty 2

## 2020-06-17 MED ORDER — ROPINIROLE HCL 0.25 MG PO TABS
0.2500 mg | ORAL_TABLET | Freq: Once | ORAL | Status: AC
  Administered 2020-06-18: 0.25 mg via ORAL
  Filled 2020-06-17 (×2): qty 1

## 2020-06-17 MED ORDER — LORAZEPAM 2 MG/ML IJ SOLN
1.0000 mg | Freq: Once | INTRAMUSCULAR | Status: AC
  Administered 2020-06-17: 1 mg via INTRAVENOUS
  Filled 2020-06-17: qty 1

## 2020-06-17 MED ORDER — ROPINIROLE HCL 1 MG PO TABS
2.0000 mg | ORAL_TABLET | Freq: Once | ORAL | Status: AC
  Administered 2020-06-17: 2 mg via ORAL
  Filled 2020-06-17: qty 2

## 2020-06-17 MED ORDER — PROMETHAZINE HCL 25 MG/ML IJ SOLN: 12.5000 mg | Freq: Once | INTRAMUSCULAR | Status: DC

## 2020-06-17 MED ORDER — GABAPENTIN 100 MG PO CAPS
100.0000 mg | ORAL_CAPSULE | Freq: Two times a day (BID) | ORAL | Status: DC
  Administered 2020-06-17 – 2020-06-19 (×4): 100 mg via ORAL
  Filled 2020-06-17 (×5): qty 1

## 2020-06-17 MED ORDER — ROPINIROLE HCL 1 MG PO TABS
2.0000 mg | ORAL_TABLET | Freq: Every day | ORAL | Status: DC
  Administered 2020-06-17 – 2020-06-19 (×3): 2 mg via ORAL
  Filled 2020-06-17 (×4): qty 2

## 2020-06-17 MED ORDER — DEXTROSE-NACL 5-0.45 % IV SOLN: INTRAVENOUS | Status: DC

## 2020-06-17 MED ORDER — GABAPENTIN 100 MG PO CAPS: 100.0000 mg | ORAL_CAPSULE | ORAL | Status: DC

## 2020-06-17 MED ORDER — HALOPERIDOL LACTATE 5 MG/ML IJ SOLN
2.0000 mg | Freq: Once | INTRAMUSCULAR | Status: AC
  Administered 2020-06-17: 2 mg via INTRAMUSCULAR

## 2020-06-17 MED ORDER — MELATONIN 5 MG PO TABS
5.0000 mg | ORAL_TABLET | Freq: Every day | ORAL | Status: DC
  Administered 2020-06-17 – 2020-06-20 (×4): 5 mg via ORAL
  Filled 2020-06-17 (×4): qty 1

## 2020-06-17 MED ORDER — HALOPERIDOL LACTATE 5 MG/ML IJ SOLN
1.0000 mg | Freq: Four times a day (QID) | INTRAMUSCULAR | Status: DC | PRN
  Filled 2020-06-17: qty 1

## 2020-06-17 MED ORDER — HALOPERIDOL LACTATE 5 MG/ML IJ SOLN
2.0000 mg | Freq: Four times a day (QID) | INTRAMUSCULAR | Status: DC | PRN
  Administered 2020-06-17: 2 mg via INTRAVENOUS
  Filled 2020-06-17: qty 1

## 2020-06-17 NOTE — Evaluation (Signed)
Physical Therapy Evaluation Patient Details Name: Sonya Goodwin MRN: [id number] DOB: 05-27-1941 Today's Date: 06/17/2020   History of Present Illness  Pt is a 79 y/o female admitted secondary to fatigue and generalized weakness. Pt found to have acute encephalopathy with ?UTI. PMH including but not limited to sarcoidosis, adrenal insufficiency, chronic kidney disease stage IIIa, type 2 diabetes mellitus.    Clinical Impression  Pt presented supine in bed with HOB elevated, awake, very restless and confused throughout session. No family members present throughout. Pt unable to provide any reliable information regarding her PLOF or home environment. Pt repeatedly stating throughout "I want to die now!" Pt frequently achieving long sitting in bed with use of bed rails and supervision for safety, and then lying back down multiple times. Transfers deferred this session secondary to safety concerns with only one therapist and pt aggressive and combative towards nursing staff just prior to session. Hopeful to progress mobility once cognition has cleared up. Pt would continue to benefit from skilled physical therapy services at this time while admitted and after d/c to address the below listed limitations in order to improve overall safety and independence with functional mobility.     Follow Up Recommendations SNF;Supervision/Assistance - 24 hour    Equipment Recommendations  None recommended by PT    Recommendations for Other Services       Precautions / Restrictions Precautions Precautions: Fall Precaution Comments: wrist restraints Restrictions Weight Bearing Restrictions: No      Mobility  Bed Mobility Overal bed mobility: Needs Assistance Bed Mobility: Supine to Sit;Sit to Supine     Supine to sit: Supervision Sit to supine: Supervision   General bed mobility comments: pt very restless throughout, constantly moving her LEs into flexion and extension, able to achieve long  sitting in bed and back to supine without any physical assistance  Transfers                 General transfer comment: unable this date as pt very confused and agitated, combative with nursing staff just prior to   Ambulation/Gait                Stairs            Wheelchair Mobility    Modified Rankin (Stroke Patients Only)       Balance Overall balance assessment: Needs assistance Sitting-balance support: Feet supported Sitting balance-Leahy Scale: Poor                                       Pertinent Vitals/Pain Pain Assessment: Faces Faces Pain Scale: Hurts little more Pain Location: generalized Pain Descriptors / Indicators: Other (Comment) (restless) Pain Intervention(s): Monitored during session    Home Living Family/patient expects to be discharged to:: Unsure                 Additional Comments: pt very confused throughout and unable to provide any information regarding home environment or PLOF    Prior Function           Comments: Unsure - pt very confused throughout and unable to provide any information regarding home environment or PLOF     Hand Dominance        Extremity/Trunk Assessment   Upper Extremity Assessment Upper Extremity Assessment: Difficult to assess due to impaired cognition    Lower Extremity Assessment Lower Extremity Assessment: Difficult to assess due to  impaired cognition       Communication   Communication: No difficulties  Cognition Arousal/Alertness: Awake/alert Behavior During Therapy: Restless;Agitated Overall Cognitive Status: Impaired/Different from baseline Area of Impairment: Orientation;Memory;Following commands;Safety/judgement;Awareness;Problem solving                 Orientation Level: Disoriented to;Time;Situation   Memory: Decreased short-term memory;Decreased recall of precautions Following Commands: Follows one step commands  inconsistently Safety/Judgement: Decreased awareness of deficits;Decreased awareness of safety Awareness: Intellectual Problem Solving: Slow processing;Requires verbal cues;Requires tactile cues        General Comments      Exercises     Assessment/Plan    PT Assessment Patient needs continued PT services  PT Problem List Decreased strength;Decreased activity tolerance;Decreased balance;Decreased mobility;Decreased coordination;Decreased cognition;Decreased knowledge of use of DME;Decreased safety awareness;Decreased knowledge of precautions       PT Treatment Interventions DME instruction;Functional mobility training;Gait training;Stair training;Therapeutic activities;Therapeutic exercise;Balance training;Neuromuscular re-education;Cognitive remediation;Patient/family education    PT Goals (Current goals can be found in the Care Plan section)  Acute Rehab PT Goals Patient Stated Goal: "I want to die" PT Goal Formulation: Patient unable to participate in goal setting Time For Goal Achievement: 07/01/20 Potential to Achieve Goals: Fair    Frequency Min 2X/week   Barriers to discharge        Co-evaluation               AM-PAC PT "6 Clicks" Mobility  Outcome Measure Help needed turning from your back to your side while in a flat bed without using bedrails?: None Help needed moving from lying on your back to sitting on the side of a flat bed without using bedrails?: None Help needed moving to and from a bed to a chair (including a wheelchair)?: None Help needed standing up from a chair using your arms (e.g., wheelchair or bedside chair)?: A Little Help needed to walk in hospital room?: A Lot Help needed climbing 3-5 steps with a railing? : A Lot 6 Click Score: 19    End of Session   Activity Tolerance: Treatment limited secondary to agitation Patient left: in bed;with call bell/phone within reach;with bed alarm set;with restraints reapplied Nurse Communication:  Mobility status PT Visit Diagnosis: Other abnormalities of gait and mobility (R26.89)    Time: 1051-1106 PT Time Calculation (min) (ACUTE ONLY): 15 min   Charges:   PT Evaluation $PT Eval Moderate Complexity: 1 Mod          Eduard Clos, PT, DPT  Acute Rehabilitation Services Pager [phone] Office [place] 06/17/2020, 12:16 PM

## 2020-06-17 NOTE — Progress Notes (Signed)
Patient became increasingly confused when had woken up. Pulled out both her right FA and right AC IV, leaving a skin tear on her right AC. When RN tried to apply gauze to dress the wound patient started becoming agitated, aggressive, and impulsive with RN. Due to escalation of issue and increase in delirium RN had to place safety mittens and got order for wrist restraints. Wrist restraints were placed then removed per request by patient's daughter. Aileen Fass, MD notified with orders. Patient now asleep after interventions.

## 2020-06-17 NOTE — Plan of Care (Signed)
  Problem: Coping: Goal: Level of anxiety will decrease Outcome: Not Progressing   

## 2020-06-17 NOTE — Progress Notes (Signed)
Patient was being given oral medications with apple sauce. Patient spitted medicine out landing on my arms and face. All other meds given thru intramuscular.

## 2020-06-17 NOTE — Progress Notes (Addendum)
[facility] PROGRESS NOTE    Progress Note  Sonya Goodwin  DJT:[id number] DOB: 05-Jan-1941 DOA: 06/15/2020 PCP: Hulan Fess, MD     Brief Narrative:   Sonya Goodwin is an 79 y.o. female Street significant for sarcoidosis, adrenal insufficiency, chronic kidney disease stage III diabetes mellitus type 2 presents for evaluation of nauseated,fatigue and generalized weakness he was feeling weak so she took prior to admission about 4 tablets of Medrol at home they were 4 mg and 9 on the day of admission  Assessment/Plan:   Acute metabolic encephalopathy: Likely polypharmacy this patient has been taking her medication unintentionally, but on deliberately. Remained afebrile. Consulted the patient refused working with him.  New acute confusional state: Probably multifactorial in the setting of B12 deficiency, polypharmacy. Avoid benzos or narcotics. We will start her on melatonin daily Seroquel at night. Use Haldol IV as needed for agitation. Physical therapy will have to be evaluated but will probably be on 06/19/2020.  Possible UTI: UTI has been ruled out.  Vitamin B12 deficiency: Continue replete B12, continue parenteral daily.  Hypokalemia: Basic metabolic panel is pending this morning.  Diabetes mellitus type 2: With an A1c of 6.5 continue sliding scale blood glucose fairly controlled.  Chronic kidney disease stage IIIa: Creatinine seems to be at baseline.    DVT prophylaxis: lovenox Family Communication:husband Status is: Inpatient  Remains inpatient appropriate because:Hemodynamically unstable   Dispo: The patient is from: Home              Anticipated d/c is to: Home              Anticipated d/c date is: 3days              Patient currently is not medically stable to d/c.        Code Status:     Code Status Orders  (From admission, onward)         Start     Ordered   06/15/20 2204  Full code  Continuous        06/15/20 2207         Code Status History    Date Active Date Inactive Code Status Order ID Comments User Context   11/18/2013 1828 11/21/2013 1603 Full Code [id number]  Melina Schools, MD Inpatient   Advance Care Planning Activity        IV Access:    Peripheral IV   Procedures and diagnostic studies:   DG Chest Portable 1 View  Result Date: 06/15/2020 CLINICAL DATA:  Fatigue, weakness EXAM: PORTABLE CHEST 1 VIEW COMPARISON:  02/05/2017 FINDINGS: Single frontal view of the chest demonstrates a stable cardiac silhouette. Marked calcification of the mitral annulus. No airspace disease, effusion, or pneumothorax. IMPRESSION: 1. Stable exam, no acute process. Electronically Signed   By: Randa Ngo M.D.   On: 06/15/2020 15:03     Medical Consultants:    None.  Anti-Infectives:   none  Subjective:    Sonya Goodwin significantly agitation stated, swearing and cursing and being belligerent and offensive to the staff.  Objective:    Vitals:   06/16/20 1748 06/16/20 1830 06/16/20 2135 06/16/20 2207  BP: 140/62  123/61   Pulse: 100  (!) 43 79  Resp: (!) 24 (!) 21 20   Temp: 99.4 F (37.4 C)  98.7 F (37.1 C)   TempSrc: Oral  Oral   SpO2: 93%  96%   Weight:   62.3 kg    SpO2: 96 %  Intake/Output Summary (Last 24 hours) at 06/17/2020 0713 Last data filed at 06/17/2020 0648 Gross per 24 hour  Intake 20 ml  Output 200 ml  Net -180 ml   Filed Weights   06/15/20 1902 06/16/20 0420 06/16/20 2135  Weight: 62.4 kg 64.5 kg 62.3 kg    Exam: General exam: Agitated screaming and hollering. Respiratory system: Good air movement and clear to auscultation. Cardiovascular system: S1 & S2 heard, RRR. No JVD. Gastrointestinal system: Abdomen is nondistended, soft and nontender.  Central nervous system: Only alert to person.  We will 4 extremities without any difficulties. Extremities: No pedal edema. Skin: No rashes, lesions or ulcers  Data Reviewed:    Labs: Basic Metabolic  Panel: Recent Labs  Lab 06/15/20 1045 06/15/20 1246 06/16/20 0421  NA 138  --  142  K 3.0*  --  3.8  CL 96*  --  106  CO2 25  --  24  GLUCOSE 168*  --  137*  BUN 24*  --  25*  CREATININE 1.11*  --  1.16*  CALCIUM 9.6  --  8.7*  MG  --  1.9  --    GFR CrCl cannot be calculated (Unknown ideal weight.). Liver Function Tests: Recent Labs  Lab 06/15/20 1045  AST 32  ALT 26  ALKPHOS 34*  BILITOT 0.9  PROT 7.7  ALBUMIN 4.3   Recent Labs  Lab 06/15/20 1045  LIPASE 29   Recent Labs  Lab 06/16/20 0421  AMMONIA 15   Coagulation profile No results for input(s): INR, PROTIME in the last 168 hours. COVID-19 Labs  No results for input(s): DDIMER, FERRITIN, LDH, CRP in the last 72 hours.  Lab Results  Component Value Date   [place] NEGATIVE 06/15/2020    CBC: Recent Labs  Lab 06/15/20 1045 06/16/20 0421  WBC 13.1* 12.5*  NEUTROABS 11.6*  --   HGB 15.6* 13.3  HCT 47.3* 41.5  MCV 94.2 97.4  PLT 210 129*   Cardiac Enzymes: No results for input(s): CKTOTAL, CKMB, CKMBINDEX, TROPONINI in the last 168 hours. BNP (last 3 results) No results for input(s): PROBNP in the last 8760 hours. CBG: Recent Labs  Lab 06/16/20 0624 06/16/20 1125 06/16/20 1531 06/16/20 2138 06/17/20 0652  GLUCAP 164* 126* 108* 103* 150*   D-Dimer: No results for input(s): DDIMER in the last 72 hours. Hgb A1c: Recent Labs    06/16/20 0421  HGBA1C 6.3*   Lipid Profile: No results for input(s): CHOL, HDL, LDLCALC, TRIG, CHOLHDL, LDLDIRECT in the last 72 hours. Thyroid function studies: Recent Labs    06/16/20 0421  TSH 0.568   Anemia work up: Recent Labs    06/16/20 0421  VITAMINB12 153*   Sepsis Labs: Recent Labs  Lab 06/15/20 1045 06/15/20 1355 06/16/20 0421  WBC 13.1*  --  12.5*  LATICACIDVEN  --  2.3*  --    Microbiology Recent Results (from the past 240 hour(s))  Urine culture     Status: Abnormal   Collection Time: 06/15/20 12:38 PM   Specimen: Urine,  Clean Catch  Result Value Ref Range Status   Specimen Description   Final    URINE, CLEAN CATCH Performed at [facility], [address] [place], [place] [address]    Special Requests   Final    NONE Performed at [facility], [address] [place], [place] [address]    Culture (A)  Final    >=100,000 COLONIES/mL MULTIPLE SPECIES PRESENT,  SUGGEST RECOLLECTION   Report Status 06/16/2020 FINAL  Final  SARS Coronavirus 2 by RT PCR (hospital order, performed in [facility] hospital lab) Nasopharyngeal Urine, Clean Catch     Status: None   Collection Time: 06/15/20 12:38 PM   Specimen: Urine, Clean Catch; Nasopharyngeal  Result Value Ref Range Status   SARS Coronavirus 2 NEGATIVE NEGATIVE Final    Comment: (NOTE) SARS-CoV-2 target nucleic acids are NOT DETECTED.  The SARS-CoV-2 RNA is generally detectable in upper and lower respiratory specimens during the acute phase of infection. The lowest concentration of SARS-CoV-2 viral copies this assay can detect is 250 copies / mL. A negative result does not preclude SARS-CoV-2 infection and should not be used as the sole basis for treatment or other patient management decisions.  A negative result may occur with improper specimen collection / handling, submission of specimen other than nasopharyngeal swab, presence of viral mutation(s) within the areas targeted by this assay, and inadequate number of viral copies (<250 copies / mL). A negative result must be combined with clinical observations, patient history, and epidemiological information.  Fact Sheet for Patients:   StrictlyIdeas.no  Fact Sheet for Healthcare Providers: BankingDealers.co.za  This test is not yet approved or  cleared by the [place] FDA and has been authorized for detection and/or diagnosis of SARS-CoV-2 by FDA under an Emergency Use Authorization (EUA).  This EUA will remain in effect  (meaning this test can be used) for the duration of the COVID-19 declaration under Section 564(b)(1) of the Act, 21 U.S.C. section 360bbb-3(b)(1), unless the authorization is terminated or revoked sooner.  Performed at [facility], [address] [place], [place] [address]   Blood culture (routine x 2)     Status: None (Preliminary result)   Collection Time: 06/15/20  1:55 PM   Specimen: BLOOD  Result Value Ref Range Status   Specimen Description   Final    BLOOD RIGHT ANTECUBITAL Performed at [facility], [place] [address] [place], [place] [address]    Special Requests   Final    BOTTLES DRAWN AEROBIC AND ANAEROBIC Blood Culture adequate volume Performed at [facility], [address] [place], [place] [address]    Culture   Final    NO GROWTH < 24 HOURS Performed at [facility], [place] [address], [place], [place] [address]    Report Status PENDING  Incomplete  Blood culture (routine x 2)     Status: None (Preliminary result)   Collection Time: 06/15/20  1:55 PM   Specimen: BLOOD LEFT FOREARM  Result Value Ref Range Status   Specimen Description   Final    BLOOD LEFT FOREARM Performed at [facility], [address] [place], [place] [address]    Special Requests   Final    BOTTLES DRAWN AEROBIC AND ANAEROBIC Blood Culture adequate volume Performed at [facility], [address] [place], [place] [address]    Culture   Final    NO GROWTH < 24 HOURS Performed at [facility], [place] [address], [place],  [address]    Report Status PENDING  Incomplete     Medications:   . aspirin EC  81 mg Oral QHS  . brinzolamide  1 drop Both Eyes BID  . cyanocobalamin  1,000 mcg Intramuscular Daily  . enoxaparin (LOVENOX) injection  40 mg Subcutaneous Q24H  . insulin aspart  0-9 Units Subcutaneous  TID WC  . LORazepam  1 mg Intramuscular Once  . methylPREDNISolone  8 mg Oral BID  . ondansetron  4 mg Intravenous  Once  . potassium chloride  10 mEq Oral Daily  . rOPINIRole  0.5 mg Oral QHS  . sodium chloride flush  3 mL Intravenous Q12H   Continuous Infusions:     LOS: 2 days   Charlynne Cousins  [facility]  06/17/2020, 7:13 AM

## 2020-06-17 NOTE — Progress Notes (Signed)
Patient confused,beligerent,attempting to get out of bed. Patient took gown off and would not let staff put it back on her. Patient combative,trying to claw and bite staff,she also pulled her IV out from her right antecubital creating a skin tear. Staff unable to redirect patient. MD,Feliz Olevia Bowens is aware and is talking to patient's daughter.

## 2020-06-17 NOTE — Progress Notes (Addendum)
Patient daughter was at the nurses station asking for her mother's  Medication for restless leg syndrome. Daughter was informed that the medication is scheduled for bedtime. Daughter mentioning that the she had spoken to the doctor about it and needs to be given that moment. Tried to explain to the daughter that I have to relay these things to the MD since the present schedule med is at bedtime. Daughter mentioned that her mom takes her medicine anytime she wants. Informed the daughter I still have to get an order before I can give her anything.  I notified the MD of family's request and called back with verbal order to give med once. Med was given to patient with the help of daughter as patient was refusing to take it from RN.

## 2020-06-17 NOTE — Progress Notes (Signed)
Patient in bed asleep with daughter at bedside and telling CNA to tell the RN that her mom needs her Requip for restless leg syndrome.  Patient was medicated prior and and does not have a PRN for that , the next scheduled med is at bedtime.  Patient asleep with daughter at bedside, asking if patient can have Requip medicine for her restless leg. Patient was moving her lower extremities a little bit, but not awake. Daughter was informed that patient was medicated this afternoon with the next scheduled dose is at bedtime. Patient has no PRN orders for Requip and will notify the MD of her request.

## 2020-06-18 LAB — GLUCOSE, CAPILLARY
Glucose-Capillary: 119 mg/dL — ABNORMAL HIGH (ref 70–99)
Glucose-Capillary: 136 mg/dL — ABNORMAL HIGH (ref 70–99)
Glucose-Capillary: 212 mg/dL — ABNORMAL HIGH (ref 70–99)
Glucose-Capillary: 139 mg/dL — ABNORMAL HIGH (ref 70–99)

## 2020-06-18 LAB — CBC
HCT: 44.9 % (ref 36.0–46.0)
Hemoglobin: 14.1 g/dL (ref 12.0–15.0)
MCH: 30.1 pg (ref 26.0–34.0)
MCHC: 31.4 g/dL (ref 30.0–36.0)
MCV: 95.7 fL (ref 80.0–100.0)
Platelets: 153 10*3/uL (ref 150–400)
RBC: 4.69 MIL/uL (ref 3.87–5.11)
RDW: 14.1 % (ref 11.5–15.5)
WBC: 9.1 10*3/uL (ref 4.0–10.5)
nRBC: 0 % (ref 0.0–0.2)

## 2020-06-18 LAB — BASIC METABOLIC PANEL
Anion gap: 11 (ref 5–15)
BUN: 18 mg/dL (ref 8–23)
CO2: 28 mmol/L (ref 22–32)
Calcium: 9 mg/dL (ref 8.9–10.3)
Chloride: 104 mmol/L (ref 98–111)
Creatinine, Ser: 0.87 mg/dL (ref 0.44–1.00)
GFR calc Af Amer: >60 mL/min (ref >60)
GFR calc non Af Amer: >60 mL/min (ref >60)
Glucose, Bld: 151 mg/dL — ABNORMAL HIGH (ref 70–99)
Potassium: 3.5 mmol/L (ref 3.5–5.1)
Sodium: 143 mmol/L (ref 135–145)

## 2020-06-18 NOTE — Progress Notes (Signed)
[facility] PROGRESS NOTE    Progress Note  Sonya Goodwin  HBZ:[id number] DOB: Jun 17, 1941 DOA: 06/15/2020 PCP: Hulan Fess, MD     Brief Narrative:   Sonya Goodwin is an 79 y.o. female Street significant for sarcoidosis, adrenal insufficiency, chronic kidney disease stage III diabetes mellitus type 2 presents for evaluation of nauseated,fatigue and generalized weakness he was feeling weak so she took prior to admission about 4 tablets of Medrol at home they were 4 mg and 9 on the day of admission  Assessment/Plan:   Acute metabolic encephalopathy: Likely polypharmacy this patient has been taking her medication as she pleases.  New acute confusional state: Probably multifactorial in the setting of B12 deficiency, polypharmacy. Continue to avoid narcotics and benzodiazepines. Continue melatonin at night along with Seroquel Haldol IV as needed, physical therapy to evaluate on 06/19/2020 Her acute confusional state seems to be improving she is less aggressive more calm.  She relates she is not hungry this morning.  Possible UTI: UTI has been ruled out.  Vitamin B12 deficiency: Continue replete B12, continue parenteral daily.  Hypokalemia: Basic metabolic panel is pending this morning.  Diabetes mellitus type 2: With an A1c of 6.5 continue sliding scale blood glucose fairly controlled.  Chronic kidney disease stage IIIa: Creatinine seems to be at baseline.    DVT prophylaxis: lovenox Family Communication:husband Status is: Inpatient  Remains inpatient appropriate because:Hemodynamically unstable   Dispo: The patient is from: Home              Anticipated d/c is to: Home              Anticipated d/c date is: 3days              Patient currently is not medically stable to d/c.   Code Status:     Code Status Orders  (From admission, onward)         Start     Ordered   06/15/20 2204  Full code  Continuous        06/15/20 2207        Code  Status History    Date Active Date Inactive Code Status Order ID Comments User Context   11/18/2013 1828 11/21/2013 1603 Full Code [id number]  Melina Schools, MD Inpatient   Advance Care Planning Activity        IV Access:    Peripheral IV   Procedures and diagnostic studies:   No results found.   Medical Consultants:    None.  Anti-Infectives:   none  Subjective:    Sonya Goodwin more calm this morning, she relates she is not hungry.  Objective:    Vitals:   06/16/20 2207 06/17/20 1034 06/17/20 2115 06/18/20 0434  BP:  (!) 163/79 (!) 105/59 133/73  Pulse: 79 (!) 102 (!) 55 (!) 103  Resp:  18 20 18   Temp:  97.6 F (36.4 C) 98.8 F (37.1 C) 97.7 F (36.5 C)  TempSrc:  Oral Axillary   SpO2:  93% 93% 95%  Weight:   63.2 kg    SpO2: 95 %   Intake/Output Summary (Last 24 hours) at 06/18/2020 0852 Last data filed at 06/18/2020 0600 Gross per 24 hour  Intake 1575.29 ml  Output 1300 ml  Net 275.29 ml   Filed Weights   06/16/20 0420 06/16/20 2135 06/17/20 2115  Weight: 64.5 kg 62.3 kg 63.2 kg    Exam: General exam: In no acute distress calm this morning Respiratory system:  Good air movement and clear to auscultation. Cardiovascular system: S1 & S2 heard, RRR. No JVD. Gastrointestinal system: Abdomen is nondistended, soft and nontender.  Central nervous system: She is only oriented to person, not very talkative this morning. Extremities: No pedal edema. Skin: No rashes, lesions or ulcers  Data Reviewed:    Labs: Basic Metabolic Panel: Recent Labs  Lab 06/15/20 1045 06/15/20 1045 06/15/20 1246 06/16/20 0421 06/17/20 1000  NA 138  --   --  142 143  K 3.0*   < >  --  3.8 3.6  CL 96*  --   --  106 108  CO2 25  --   --  24 22  GLUCOSE 168*  --   --  137* 133*  BUN 24*  --   --  25* 28*  CREATININE 1.11*  --   --  1.16* 1.16*  CALCIUM 9.6  --   --  8.7* 9.2  MG  --   --  1.9  --   --    < > = values in this interval not displayed.    GFR CrCl cannot be calculated (Unknown ideal weight.). Liver Function Tests: Recent Labs  Lab 06/15/20 1045  AST 32  ALT 26  ALKPHOS 34*  BILITOT 0.9  PROT 7.7  ALBUMIN 4.3   Recent Labs  Lab 06/15/20 1045  LIPASE 29   Recent Labs  Lab 06/16/20 0421  AMMONIA 15   Coagulation profile No results for input(s): INR, PROTIME in the last 168 hours. COVID-19 Labs  No results for input(s): DDIMER, FERRITIN, LDH, CRP in the last 72 hours.  Lab Results  Component Value Date   [place] NEGATIVE 06/15/2020    CBC: Recent Labs  Lab 06/15/20 1045 06/16/20 0421 06/17/20 1000  WBC 13.1* 12.5* 11.9*  NEUTROABS 11.6*  --   --   HGB 15.6* 13.3 14.3  HCT 47.3* 41.5 45.6  MCV 94.2 97.4 97.2  PLT 210 129* 171   Cardiac Enzymes: No results for input(s): CKTOTAL, CKMB, CKMBINDEX, TROPONINI in the last 168 hours. BNP (last 3 results) No results for input(s): PROBNP in the last 8760 hours. CBG: Recent Labs  Lab 06/16/20 2138 06/17/20 0652 06/17/20 1133 06/17/20 1820 06/17/20 2112  GLUCAP 103* 150* 140* 105* 122*   D-Dimer: No results for input(s): DDIMER in the last 72 hours. Hgb A1c: Recent Labs    06/16/20 0421  HGBA1C 6.3*   Lipid Profile: No results for input(s): CHOL, HDL, LDLCALC, TRIG, CHOLHDL, LDLDIRECT in the last 72 hours. Thyroid function studies: Recent Labs    06/16/20 0421  TSH 0.568   Anemia work up: Recent Labs    06/16/20 0421  VITAMINB12 153*   Sepsis Labs: Recent Labs  Lab 06/15/20 1045 06/15/20 1355 06/16/20 0421 06/17/20 1000  WBC 13.1*  --  12.5* 11.9*  LATICACIDVEN  --  2.3*  --   --    Microbiology Recent Results (from the past 240 hour(s))  Urine culture     Status: Abnormal   Collection Time: 06/15/20 12:38 PM   Specimen: Urine, Clean Catch  Result Value Ref Range Status   Specimen Description   Final    URINE, CLEAN CATCH Performed at [facility], [address] [place], [place] [address]     Special Requests   Final    NONE Performed at [facility], [address] [place], [place] [address]    Culture (A)  Final    >=  100,000 COLONIES/mL MULTIPLE SPECIES PRESENT, SUGGEST RECOLLECTION   Report Status 06/16/2020 FINAL  Final  SARS Coronavirus 2 by RT PCR (hospital order, performed in [facility] hospital lab) Nasopharyngeal Urine, Clean Catch     Status: None   Collection Time: 06/15/20 12:38 PM   Specimen: Urine, Clean Catch; Nasopharyngeal  Result Value Ref Range Status   SARS Coronavirus 2 NEGATIVE NEGATIVE Final    Comment: (NOTE) SARS-CoV-2 target nucleic acids are NOT DETECTED.  The SARS-CoV-2 RNA is generally detectable in upper and lower respiratory specimens during the acute phase of infection. The lowest concentration of SARS-CoV-2 viral copies this assay can detect is 250 copies / mL. A negative result does not preclude SARS-CoV-2 infection and should not be used as the sole basis for treatment or other patient management decisions.  A negative result may occur with improper specimen collection / handling, submission of specimen other than nasopharyngeal swab, presence of viral mutation(s) within the areas targeted by this assay, and inadequate number of viral copies (<250 copies / mL). A negative result must be combined with clinical observations, patient history, and epidemiological information.  Fact Sheet for Patients:   StrictlyIdeas.no  Fact Sheet for Healthcare Providers: BankingDealers.co.za  This test is not yet approved or  cleared by the [place] FDA and has been authorized for detection and/or diagnosis of SARS-CoV-2 by FDA under an Emergency Use Authorization (EUA).  This EUA will remain in effect (meaning this test can be used) for the duration of the COVID-19 declaration under Section 564(b)(1) of the Act, 21 U.S.C. section 360bbb-3(b)(1), unless the authorization is terminated  or revoked sooner.  Performed at [facility], [address] [place], [place] [address]   Blood culture (routine x 2)     Status: None (Preliminary result)   Collection Time: 06/15/20  1:55 PM   Specimen: BLOOD  Result Value Ref Range Status   Specimen Description   Final    BLOOD RIGHT ANTECUBITAL Performed at [facility], [place] [address], [place], [place] [address]    Special Requests   Final    BOTTLES DRAWN AEROBIC AND ANAEROBIC Blood Culture adequate volume Performed at [facility], [address] [place], [place] [address]    Culture   Final    NO GROWTH 3 DAYS Performed at [facility], [place] [address], [place], [place] [address]    Report Status PENDING  Incomplete  Blood culture (routine x 2)     Status: None (Preliminary result)   Collection Time: 06/15/20  1:55 PM   Specimen: BLOOD LEFT FOREARM  Result Value Ref Range Status   Specimen Description   Final    BLOOD LEFT FOREARM Performed at [facility], [address] [place], [place] [address]    Special Requests   Final    BOTTLES DRAWN AEROBIC AND ANAEROBIC Blood Culture adequate volume Performed at [facility], [address] [place], [place] [address]    Culture   Final    NO GROWTH 3 DAYS Performed at [facility], [place] [address] [place], [place] [address]    Report Status PENDING  Incomplete     Medications:    aspirin EC  81 mg Oral QHS   brinzolamide  1 drop Both Eyes BID   cyanocobalamin  1,000 mcg Intramuscular Daily   enoxaparin (LOVENOX) injection  40 mg Subcutaneous Q24H   gabapentin  100  mg Oral BID   And   gabapentin  600 mg Oral QHS   insulin aspart  0-9 Units Subcutaneous TID WC   melatonin  5 mg Oral QHS   methylPREDNISolone  8 mg Oral BID   ondansetron  4 mg Intravenous Once   potassium chloride  10 mEq Oral Daily   rOPINIRole  0.25 mg Oral Once   rOPINIRole  2 mg Oral QHS   sodium chloride flush   3 mL Intravenous Q12H   Continuous Infusions:  dextrose 5 % and 0.45% NaCl 75 mL/hr at 06/17/20 1350      LOS: 3 days   Sonya Goodwin  [facility]  06/18/2020, 8:52 AM

## 2020-06-18 NOTE — TOC Initial Note (Signed)
Transition of Care [facility]) - Initial/Assessment Note    Patient Details  Name: Sonya Goodwin MRN: [id number] Date of Birth: July 10, 1941  [facility]) CM/SW Contact:    Trula Ore, [place] Phone Number: 06/18/2020, 11:51 AM  Clinical Narrative:                  CSW spoke with patients spouse Barbaraann Rondo. Patients spouse Barbaraann Rondo declined SNF placement for patient. Patients spouse is interested in [facility] for patient. CSW let patients spouse know she will have case manager to reach back out to him about home health services for patient.  TOC team will continue to follow. Expected Discharge Plan: [place] Barriers to Discharge: Continued Medical Work up   Patient Goals and CMS Choice Patient states their goals for this hospitalization and ongoing recovery are:: to go home with home health servcices CMS Medicare.gov Compare Post Acute Care list provided to:: Patient Represenative (must comment) (Spouse Barbaraann Rondo) Choice offered to / list presented to : Spouse Barbaraann Rondo)  Expected Discharge Plan and Services Expected Discharge Plan: [place]       Living arrangements for the past 2 months: Single Family Home                                      Prior Living Arrangements/Services Living arrangements for the past 2 months: Single Family Home Lives with:: Self, Spouse Patient language and need for interpreter reviewed:: Yes Do you feel safe going back to the place where you live?: Yes      Need for Family Participation in Patient Care: Yes (Comment) Care giver support system in place?: Yes (comment)   Criminal Activity/Legal Involvement Pertinent to Current Situation/Hospitalization: No - Comment as needed  Activities of Daily Living      Permission Sought/Granted Permission sought to share information with : Case Manager, Family Supports, Customer service manager                Emotional Assessment        Orientation: : Oriented to Self Alcohol / Substance Use: Not Applicable Psych Involvement: No (comment)  Admission diagnosis:  Adrenal insufficiency (HCC) [E27.40] UTI (urinary tract infection) [N39.0] Generalized weakness [R53.1] Urinary tract infection without hematuria, site unspecified [N39.0] Patient Active Problem List   Diagnosis Date Noted   UTI (urinary tract infection) 06/15/2020   Acute encephalopathy 06/15/2020   Hypokalemia 06/15/2020   Dyslipidemia 11/18/2014   Sarcoidosis    Adrenal insufficiency ([place])    Chronic kidney disease, stage 3a    Hypertension    Type 2 diabetes mellitus with stage 3 chronic kidney disease ([place])    PCP:  Hulan Fess, MD Pharmacy:   [id number] [place], [address] [place] [address] Phone: [phone] Fax: [phone]     Social Determinants of Health (SDOH) Interventions    Readmission Risk Interventions No flowsheet data found.

## 2020-06-18 NOTE — Plan of Care (Signed)
  Problem: Nutrition: Goal: Adequate nutrition will be maintained Outcome: Progressing   Problem: Coping: Goal: Level of anxiety will decrease Outcome: Progressing   Problem: Skin Integrity: Goal: Risk for impaired skin integrity will decrease Outcome: Progressing   

## 2020-06-18 NOTE — Progress Notes (Signed)
Patient had lunch and RN was in room when patient burped. Patient daughter at bedside requested for nausea medicine and was medicated.  After 3 hours later patient had another episode of nausea, and was medicated again with Zofran IV with some relief noted.  2 hours after, patient's daughter asking for more medication for nausea and had explained it is not scheduled yet and have to be relayed to the MD for orders. Relayed message to MD for further disposition. Informed that daughter that message was relayed to MD and just awaiting for response.

## 2020-06-19 DIAGNOSIS — G92 Toxic encephalopathy: Secondary | ICD-10-CM

## 2020-06-19 DIAGNOSIS — E2740 Unspecified adrenocortical insufficiency: Secondary | ICD-10-CM

## 2020-06-19 LAB — CBC
HCT: 44 % (ref 36.0–46.0)
Hemoglobin: 14.2 g/dL (ref 12.0–15.0)
MCH: 30.9 pg (ref 26.0–34.0)
MCHC: 32.3 g/dL (ref 30.0–36.0)
MCV: 95.7 fL (ref 80.0–100.0)
Platelets: 168 10*3/uL (ref 150–400)
RBC: 4.6 MIL/uL (ref 3.87–5.11)
RDW: 14.1 % (ref 11.5–15.5)
WBC: 8.9 10*3/uL (ref 4.0–10.5)
nRBC: 0 % (ref 0.0–0.2)

## 2020-06-19 LAB — BASIC METABOLIC PANEL
Anion gap: 12 (ref 5–15)
BUN: 16 mg/dL (ref 8–23)
CO2: 26 mmol/L (ref 22–32)
Calcium: 8.9 mg/dL (ref 8.9–10.3)
Chloride: 102 mmol/L (ref 98–111)
Creatinine, Ser: 0.94 mg/dL (ref 0.44–1.00)
GFR calc Af Amer: >60 mL/min (ref >60)
GFR calc non Af Amer: 58 mL/min — ABNORMAL LOW (ref >60)
Glucose, Bld: 143 mg/dL — ABNORMAL HIGH (ref 70–99)
Potassium: 3.5 mmol/L (ref 3.5–5.1)
Sodium: 140 mmol/L (ref 135–145)

## 2020-06-19 LAB — GLUCOSE, CAPILLARY
Glucose-Capillary: 142 mg/dL — ABNORMAL HIGH (ref 70–99)
Glucose-Capillary: 113 mg/dL — ABNORMAL HIGH (ref 70–99)
Glucose-Capillary: 176 mg/dL — ABNORMAL HIGH (ref 70–99)
Glucose-Capillary: 106 mg/dL — ABNORMAL HIGH (ref 70–99)

## 2020-06-19 MED ORDER — POLYETHYLENE GLYCOL 3350 17 G PO PACK
17.0000 g | PACK | Freq: Every day | ORAL | Status: DC
  Administered 2020-06-20 – 2020-06-21 (×2): 17 g via ORAL
  Filled 2020-06-19 (×2): qty 1

## 2020-06-19 MED ORDER — METOCLOPRAMIDE HCL 5 MG/5ML PO SOLN
5.0000 mg | Freq: Three times a day (TID) | ORAL | Status: DC
  Filled 2020-06-19: qty 5

## 2020-06-19 MED ORDER — GABAPENTIN 100 MG PO CAPS
100.0000 mg | ORAL_CAPSULE | Freq: Two times a day (BID) | ORAL | Status: DC
  Administered 2020-06-19 – 2020-06-21 (×4): 100 mg via ORAL
  Filled 2020-06-19 (×4): qty 1

## 2020-06-19 MED ORDER — PANTOPRAZOLE SODIUM 40 MG PO TBEC
40.0000 mg | DELAYED_RELEASE_TABLET | Freq: Every day | ORAL | Status: DC
  Administered 2020-06-20 – 2020-06-21 (×2): 40 mg via ORAL
  Filled 2020-06-19 (×2): qty 1

## 2020-06-19 MED ORDER — GABAPENTIN 600 MG PO TABS
300.0000 mg | ORAL_TABLET | Freq: Every day | ORAL | Status: DC
  Administered 2020-06-19 – 2020-06-20 (×2): 300 mg via ORAL
  Filled 2020-06-19 (×2): qty 1

## 2020-06-19 MED ORDER — MAGNESIUM SULFATE 2 GM/50ML IV SOLN
2.0000 g | Freq: Once | INTRAVENOUS | Status: DC
  Filled 2020-06-19: qty 50

## 2020-06-19 MED ORDER — POTASSIUM CHLORIDE CRYS ER 20 MEQ PO TBCR: 20.0000 meq | EXTENDED_RELEASE_TABLET | Freq: Two times a day (BID) | ORAL | Status: DC

## 2020-06-19 MED ORDER — POTASSIUM CHLORIDE 10 MEQ/100ML IV SOLN
10.0000 meq | INTRAVENOUS | Status: AC
  Administered 2020-06-19: 10 meq via INTRAVENOUS
  Filled 2020-06-19 (×2): qty 100

## 2020-06-19 NOTE — Progress Notes (Addendum)
[facility] PROGRESS NOTE    Progress Note  Sonya Goodwin  SJG:[id number] DOB: 1941/07/17 DOA: 06/15/2020 PCP: Hulan Fess, MD     Brief Narrative:   Sonya Goodwin is an 79 y.o. female Street significant for sarcoidosis, adrenal insufficiency, chronic kidney disease stage III diabetes mellitus type 2 presents for evaluation of nauseated,fatigue and generalized weakness he was feeling weak so she took prior to admission about 4 tablets of Medrol at home they were 4 mg and 9 on the day of admission  Assessment/Plan:   Acute metabolic encephalopathy: Likely polypharmacy this patient has been taking her medication as she pleases. Likely due to excess of Requip and Neurontin we had decreased her Neurontin at bedtime.  New acute confusional state: Probably multifactorial in the setting of B12 deficiency, polypharmacy. Continue to avoid narcotics and benzodiazepines. Haldol IV as needed, physical therapy to evaluate on 06/19/2020 Her acute confusional state seems to be improving she is less aggressive more calm.  She relates she is not hungry this morning.  Possible UTI: UTI has been ruled out.  Vitamin B12 deficiency: Continue replete B12, continue parenteral daily.  Hypokalemia: Basic metabolic panel is pending this morning.  Diabetes mellitus type 2: With an A1c of 6.5 continue sliding scale blood glucose fairly controlled.  Chronic kidney disease stage IIIa: Creatinine seems to be at baseline.  Relative adrenal insufficiency: Continue current dose of Medrol.  Decrease appetite, nausea and vomiting: Currently on Zofran.  She was tolerating her diet this morning, but yet the daughter believes that she is not eating. Also in the differential include relative adrenal insufficiency and possibly serotonin syndrome as the daughter and the patient take her medications inadequately she could be overdosing in her Requip. Have decreased her Neurontin. The daughter  relates she vomited yesterday she ate minimally, she was given Zofran and her nausea resolved.   DVT prophylaxis: lovenox Family Communication: Daughter Status is: Inpatient  Remains inpatient appropriate because:Hemodynamically unstable   Dispo: The patient is from: Home              Anticipated d/c is to: SNF              Anticipated d/c date is: 1 days              Patient currently is not medically stable to d/c.   Code Status:     Code Status Orders  (From admission, onward)         Start     Ordered   06/15/20 2204  Full code  Continuous        06/15/20 2207        Code Status History    Date Active Date Inactive Code Status Order ID Comments User Context   11/18/2013 1828 11/21/2013 1603 Full Code [id number]  Melina Schools, MD Inpatient   Advance Care Planning Activity        IV Access:    Peripheral IV   Procedures and diagnostic studies:   No results found.   Medical Consultants:    None.  Anti-Infectives:   none  Subjective:    Sonya Goodwin patient is tolerating her diet this morning.  Objective:    Vitals:   06/18/20 0906 06/18/20 1714 06/18/20 2057 06/19/20 0909  BP: 133/78 (!) 164/81 (!) 158/98 130/70  Pulse: 72 93 94 72  Resp: 18 20 20 18   Temp: 97.8 F (36.6 C) 99.4 F (37.4 C) 99.1 F (37.3 C)   TempSrc:  Oral Oral Oral   SpO2: 95% 92% 95% 90%  Weight:   63.1 kg    SpO2: 90 %   Intake/Output Summary (Last 24 hours) at 06/19/2020 1008 Last data filed at 06/19/2020 0600 Gross per 24 hour  Intake 1903.06 ml  Output 2450 ml  Net -546.94 ml   Filed Weights   06/16/20 2135 06/17/20 2115 06/18/20 2057  Weight: 62.3 kg 63.2 kg 63.1 kg    Exam: General exam: In no acute distress. Respiratory system: Good air movement and clear to auscultation. Cardiovascular system: S1 & S2 heard, RRR. No JVD. Gastrointestinal system: Abdomen is nondistended, soft and nontender.  Extremities: No pedal edema. Skin: No rashes,  lesions or ulcers Psychiatry: Judgement and insight appear normal.   Data Reviewed:    Labs: Basic Metabolic Panel: Recent Labs  Lab 06/15/20 1045 06/15/20 1045 06/15/20 1246 06/16/20 0421 06/16/20 0421 06/17/20 1000 06/17/20 1000 06/18/20 1158 06/19/20 0532  NA 138  --   --  142  --  143  --  143 140  K 3.0*   < >  --  3.8   < > 3.6   < > 3.5 3.5  CL 96*  --   --  106  --  108  --  104 102  CO2 25  --   --  24  --  22  --  28 26  GLUCOSE 168*  --   --  137*  --  133*  --  151* 143*  BUN 24*  --   --  25*  --  28*  --  18 16  CREATININE 1.11*  --   --  1.16*  --  1.16*  --  0.87 0.94  CALCIUM 9.6  --   --  8.7*  --  9.2  --  9.0 8.9  MG  --   --  1.9  --   --   --   --   --   --    < > = values in this interval not displayed.   GFR CrCl cannot be calculated (Unknown ideal weight.). Liver Function Tests: Recent Labs  Lab 06/15/20 1045  AST 32  ALT 26  ALKPHOS 34*  BILITOT 0.9  PROT 7.7  ALBUMIN 4.3   Recent Labs  Lab 06/15/20 1045  LIPASE 29   Recent Labs  Lab 06/16/20 0421  AMMONIA 15   Coagulation profile No results for input(s): INR, PROTIME in the last 168 hours. COVID-19 Labs  No results for input(s): DDIMER, FERRITIN, LDH, CRP in the last 72 hours.  Lab Results  Component Value Date   [place] NEGATIVE 06/15/2020    CBC: Recent Labs  Lab 06/15/20 1045 06/16/20 0421 06/17/20 1000 06/18/20 1158 06/19/20 0532  WBC 13.1* 12.5* 11.9* 9.1 8.9  NEUTROABS 11.6*  --   --   --   --   HGB 15.6* 13.3 14.3 14.1 14.2  HCT 47.3* 41.5 45.6 44.9 44.0  MCV 94.2 97.4 97.2 95.7 95.7  PLT 210 129* 171 153 168   Cardiac Enzymes: No results for input(s): CKTOTAL, CKMB, CKMBINDEX, TROPONINI in the last 168 hours. BNP (last 3 results) No results for input(s): PROBNP in the last 8760 hours. CBG: Recent Labs  Lab 06/18/20 0813 06/18/20 1137 06/18/20 1716 06/18/20 2057 06/19/20 0653  GLUCAP 119* 136* 212* 139* 142*   D-Dimer: No results for  input(s): DDIMER in the last 72 hours. Hgb A1c: No results for input(s): HGBA1C in the  last 72 hours. Lipid Profile: No results for input(s): CHOL, HDL, LDLCALC, TRIG, CHOLHDL, LDLDIRECT in the last 72 hours. Thyroid function studies: No results for input(s): TSH, T4TOTAL, T3FREE, THYROIDAB in the last 72 hours.  Invalid input(s): FREET3 Anemia work up: No results for input(s): VITAMINB12, FOLATE, FERRITIN, TIBC, IRON, RETICCTPCT in the last 72 hours. Sepsis Labs: Recent Labs  Lab 06/15/20 1045 06/15/20 1355 06/16/20 0421 06/17/20 1000 06/18/20 1158 06/19/20 0532  WBC   < >  --  12.5* 11.9* 9.1 8.9  LATICACIDVEN  --  2.3*  --   --   --   --    < > = values in this interval not displayed.   Microbiology Recent Results (from the past 240 hour(s))  Urine culture     Status: Abnormal   Collection Time: 06/15/20 12:38 PM   Specimen: Urine, Clean Catch  Result Value Ref Range Status   Specimen Description   Final    URINE, CLEAN CATCH Performed at [facility], [address] [place], [place] [address]    Special Requests   Final    NONE Performed at [facility], [address] [place], [place] [address]    Culture (A)  Final    >=100,000 COLONIES/mL MULTIPLE SPECIES PRESENT, SUGGEST RECOLLECTION   Report Status 06/16/2020 FINAL  Final  SARS Coronavirus 2 by RT PCR (hospital order, performed in [facility] hospital lab) Nasopharyngeal Urine, Clean Catch     Status: None   Collection Time: 06/15/20 12:38 PM   Specimen: Urine, Clean Catch; Nasopharyngeal  Result Value Ref Range Status   SARS Coronavirus 2 NEGATIVE NEGATIVE Final    Comment: (NOTE) SARS-CoV-2 target nucleic acids are NOT DETECTED.  The SARS-CoV-2 RNA is generally detectable in upper and lower respiratory specimens during the acute phase of infection. The lowest concentration of SARS-CoV-2 viral copies this assay can detect is 250 copies / mL. A negative result does not preclude  SARS-CoV-2 infection and should not be used as the sole basis for treatment or other patient management decisions.  A negative result may occur with improper specimen collection / handling, submission of specimen other than nasopharyngeal swab, presence of viral mutation(s) within the areas targeted by this assay, and inadequate number of viral copies (<250 copies / mL). A negative result must be combined with clinical observations, patient history, and epidemiological information.  Fact Sheet for Patients:   StrictlyIdeas.no  Fact Sheet for Healthcare Providers: BankingDealers.co.za  This test is not yet approved or  cleared by the [place] FDA and has been authorized for detection and/or diagnosis of SARS-CoV-2 by FDA under an Emergency Use Authorization (EUA).  This EUA will remain in effect (meaning this test can be used) for the duration of the COVID-19 declaration under Section 564(b)(1) of the Act, 21 U.S.C. section 360bbb-3(b)(1), unless the authorization is terminated or revoked sooner.  Performed at [facility], [address] [place], [place] [address]   Blood culture (routine x 2)     Status: None (Preliminary result)   Collection Time: 06/15/20  1:55 PM   Specimen: BLOOD  Result Value Ref Range Status   Specimen Description   Final    BLOOD RIGHT ANTECUBITAL Performed at [facility], [place] [address], [place], [place] [address]    Special Requests   Final    BOTTLES DRAWN AEROBIC AND ANAEROBIC Blood Culture adequate volume Performed at [facility], [phone] [place], [place] [address]    Culture   Final    NO GROWTH 4 DAYS Performed at [facility] [address] [place], [place] [address]    Report Status PENDING  Incomplete  Blood culture (routine x 2)     Status: None (Preliminary result)   Collection Time: 06/15/20  1:55 PM   Specimen: BLOOD LEFT FOREARM   Result Value Ref Range Status   Specimen Description   Final    BLOOD LEFT FOREARM Performed at [facility], [address] [place], [place] [address]    Special Requests   Final    BOTTLES DRAWN AEROBIC AND ANAEROBIC Blood Culture adequate volume Performed at [facility], [address] [place], [place] [address]    Culture   Final    NO GROWTH 4 DAYS Performed at [facility], [place] [address], [place], [place] [address]    Report Status PENDING  Incomplete     Medications:   . aspirin EC  81 mg Oral QHS  . brinzolamide  1 drop Both Eyes BID  . cyanocobalamin  1,000 mcg Intramuscular Daily  . enoxaparin (LOVENOX) injection  40 mg Subcutaneous Q24H  . gabapentin  100 mg Oral BID   And  . gabapentin  600 mg Oral QHS  . insulin aspart  0-9 Units Subcutaneous TID WC  . melatonin  5 mg Oral QHS  . methylPREDNISolone  8 mg Oral BID  . metoCLOPramide  5 mg Oral TID AC  . polyethylene glycol  17 g Oral Daily  . potassium chloride  10 mEq Oral Daily  . rOPINIRole  2 mg Oral QHS  . sodium chloride flush  3 mL Intravenous Q12H   Continuous Infusions: . dextrose 5 % and 0.45% NaCl 75 mL/hr at 06/18/20 1500      LOS: 4 days   Charlynne Cousins  [facility]  06/19/2020, 10:08 AM

## 2020-06-20 LAB — BASIC METABOLIC PANEL
Anion gap: 10 (ref 5–15)
BUN: 21 mg/dL (ref 8–23)
CO2: 24 mmol/L (ref 22–32)
Calcium: 9.1 mg/dL (ref 8.9–10.3)
Chloride: 105 mmol/L (ref 98–111)
Creatinine, Ser: 1.09 mg/dL — ABNORMAL HIGH (ref 0.44–1.00)
GFR calc Af Amer: 56 mL/min — ABNORMAL LOW (ref >60)
GFR calc non Af Amer: 48 mL/min — ABNORMAL LOW (ref >60)
Glucose, Bld: 139 mg/dL — ABNORMAL HIGH (ref 70–99)
Potassium: 4.1 mmol/L (ref 3.5–5.1)
Sodium: 139 mmol/L (ref 135–145)

## 2020-06-20 LAB — CULTURE, BLOOD (ROUTINE X 2)
Culture: NO GROWTH
Culture: NO GROWTH
Special Requests: ADEQUATE
Special Requests: ADEQUATE

## 2020-06-20 LAB — GLUCOSE, CAPILLARY
Glucose-Capillary: 147 mg/dL — ABNORMAL HIGH (ref 70–99)
Glucose-Capillary: 111 mg/dL — ABNORMAL HIGH (ref 70–99)
Glucose-Capillary: 140 mg/dL — ABNORMAL HIGH (ref 70–99)
Glucose-Capillary: 94 mg/dL (ref 70–99)

## 2020-06-20 LAB — FERRITIN: Ferritin: 112 ng/mL (ref 11–307)

## 2020-06-20 LAB — MAGNESIUM: Magnesium: 1.9 mg/dL (ref 1.7–2.4)

## 2020-06-20 LAB — PHOSPHORUS: Phosphorus: 4.7 mg/dL — ABNORMAL HIGH (ref 2.5–4.6)

## 2020-06-20 MED ORDER — ROPINIROLE HCL 1 MG PO TABS: 2.0000 mg | ORAL_TABLET | Freq: Every day | ORAL | Status: DC

## 2020-06-20 MED ORDER — ROPINIROLE HCL 1 MG PO TABS
2.0000 mg | ORAL_TABLET | Freq: Every day | ORAL | Status: DC
  Administered 2020-06-20: 2 mg via ORAL
  Filled 2020-06-20: qty 2

## 2020-06-20 MED ORDER — FOSFOMYCIN TROMETHAMINE 3 G PO PACK
3.0000 g | PACK | Freq: Once | ORAL | Status: AC
  Administered 2020-06-20: 3 g via ORAL
  Filled 2020-06-20: qty 3

## 2020-06-20 MED ORDER — SENNOSIDES-DOCUSATE SODIUM 8.6-50 MG PO TABS
1.0000 | ORAL_TABLET | Freq: Two times a day (BID) | ORAL | Status: DC
  Administered 2020-06-20 – 2020-06-21 (×2): 1 via ORAL
  Filled 2020-06-20 (×3): qty 1

## 2020-06-20 MED ORDER — LACTATED RINGERS IV SOLN: INTRAVENOUS | Status: DC

## 2020-06-20 MED ORDER — METHYLPREDNISOLONE 4 MG PO TABS
4.0000 mg | ORAL_TABLET | Freq: Two times a day (BID) | ORAL | Status: DC
  Administered 2020-06-20 – 2020-06-21 (×2): 4 mg via ORAL
  Filled 2020-06-20 (×3): qty 1

## 2020-06-20 NOTE — Progress Notes (Signed)
PROGRESS NOTE  Sonya Goodwin IOE:[id number] DOB: 03-18-1941 DOA: 06/15/2020 PCP: Hulan Fess, MD  HPI/Recap of past 24 hours: Sonya Goodwin is an 79 y.o. female with PMH significant for sarcoidosis, adrenal insufficiency, chronic kidney disease stage III, diabetes mellitus type 2 presents for evaluation of nausea, fatigue and generalized weakness. She was feeling weak so she took multiple tablets of Medrol prior to admission.  She was admitted for acute metabolic encephalopathy secondary to polypharmacy and dehydration.  06/20/20: Seen and examined with her daughter at bedside.  She is alert and oriented x3.  Her nausea has improved.  PT assessment recommended home health PT.  Has not had a bowel movement since Thursday, increased her doses for bowel regimen.   Assessment/Plan: Principal Problem:   Acute encephalopathy Active Problems:   Sarcoidosis   Adrenal insufficiency (HCC)   Chronic kidney disease, stage 3a   Type 2 diabetes mellitus with stage 3 chronic kidney disease (HCC)   UTI (urinary tract infection)   Hypokalemia  Improving acute metabolic encephalopathy: Likely multifactorial secondary to polypharmacy, dehydration, and B12 deficiency Appears to be confused about how to take her medications.  Will benefit from home health RN. Presently she is alert oriented x3.  Per daughter she is almost completely back to her baseline mentation. Continue to reorient as needed Avoid dehydration Continue PT OT with assistance and fall precautions  Possible UTI: Urine culture taken on 06/15/20 grew > 100, colonies multiple species present. Will treat with 1 dose of fosfomycin  Vitamin B12 deficiency: Continue to replete B12, continue parenteral daily.  Restless leg syndrome/polyneuropathy Continue ropinirole, gabapentin Obtain ferritin level  Adrenal insufficiency Blood pressure is at goal Resume home dose of Medrol 4 mg twice daily Continue PPI for GI  prophylaxis TSH normal at 0.568 on 06/16/2020. B12 deficiency, being repleted Will need close follow-up appointment with her endocrinologist.  Resolved post repletion: Hypokalemia: Serum potassium 4.1 Serum magnesium 1.9.  Diabetes mellitus type 2 with hyperglycemia likely exacerbated by steroids: Hemoglobin A1c of 6.3 on 06/16/2020  Continue insulin sliding scale  Avoid hypoglycemia  Chronic kidney disease stage IIIA: Baseline creatinine appears to be 0.9 with GFR of 58 Mild uptrending of her creatinine 1.09 likely secondary to dehydration Encourage oral free water intake We will start gentle IV fluid hydration LR at 50 cc/h Monitor urine output and repeat BMP in the morning  Resolved nausea and vomiting: Abdominal exam benign this morning IV antiemetics as needed Denies any nausea this morning Continue to encourage oral intake  Constipation No bowel movement in the last 5 days Increase bowel regimen, if unsuccessful will give smog enema  Ambulatory dysfunction/physical debility PT assessment recommended home health PT  Continue PT OT with assistance and fall precautions   DVT prophylaxis: lovenox subcu daily Family Communication: Daughter at bedside. Status is: Inpatient  Remains inpatient appropriate because:Hemodynamically unstable   Dispo: The patient is from: Home  Anticipated d/c is to:  Home with home health PT OT RN  Anticipated d/c date is: 06/21/2020  Patient currently is not medically stable to d/c.   Code Status:  Full code.   Objective: Vitals:   06/19/20 1650 06/19/20 2049 06/20/20 0502 06/20/20 0944  BP: 137/86 130/62 121/64 (!) 146/71  Pulse: (!) 59 69 74 73  Resp: 18 16 18 18   Temp: 98.1 F (36.7 C) 98.3 F (36.8 C) 98.3 F (36.8 C) (!) 97.4 F (36.3 C)  TempSrc: Oral Oral Oral Oral  SpO2: 91% 94% 92% 94%  Weight:  64.1 kg      Intake/Output Summary (Last 24 hours) at 06/20/2020 1350 Last  data filed at 06/20/2020 0910 Gross per 24 hour  Intake 710.89 ml  Output 700 ml  Net 10.89 ml   Filed Weights   06/17/20 2115 06/18/20 2057 06/19/20 2049  Weight: 63.2 kg 63.1 kg 64.1 kg    Exam:  . General: 79 y.o. year-old female well developed well nourished in no acute distress.  Alert and oriented x3. . Cardiovascular: Regular rate and rhythm with no rubs or gallops.  No thyromegaly or JVD noted.   Marland Kitchen Respiratory: Clear to auscultation with no wheezes or rales. Good inspiratory effort. . Abdomen: Soft nontender nondistended with normal bowel sounds x4 quadrants. . Musculoskeletal: No lower extremity edema.  . Skin: Very thin skin with bruising diffusely. Marland Kitchen Psychiatry: Mood is appropriate for condition and setting   Data Reviewed: CBC: Recent Labs  Lab 06/15/20 1045 06/16/20 0421 06/17/20 1000 06/18/20 1158 06/19/20 0532  WBC 13.1* 12.5* 11.9* 9.1 8.9  NEUTROABS 11.6*  --   --   --   --   HGB 15.6* 13.3 14.3 14.1 14.2  HCT 47.3* 41.5 45.6 44.9 44.0  MCV 94.2 97.4 97.2 95.7 95.7  PLT 210 129* 171 153 709   Basic Metabolic Panel: Recent Labs  Lab 06/15/20 1045 06/15/20 1246 06/16/20 0421 06/17/20 1000 06/18/20 1158 06/19/20 0532 06/20/20 0622  NA   < >  --  142 143 143 140 139  K   < >  --  3.8 3.6 3.5 3.5 4.1  CL   < >  --  106 108 104 102 105  CO2   < >  --  24 22 28 26 24   GLUCOSE   < >  --  137* 133* 151* 143* 139*  BUN   < >  --  25* 28* 18 16 21   CREATININE   < >  --  1.16* 1.16* 0.87 0.94 1.09*  CALCIUM   < >  --  8.7* 9.2 9.0 8.9 9.1  MG  --  1.9  --   --   --   --  1.9  PHOS  --   --   --   --   --   --  4.7*   < > = values in this interval not displayed.   GFR: CrCl cannot be calculated (Unknown ideal weight.). Liver Function Tests: Recent Labs  Lab 06/15/20 1045  AST 32  ALT 26  ALKPHOS 34*  BILITOT 0.9  PROT 7.7  ALBUMIN 4.3   Recent Labs  Lab 06/15/20 1045  LIPASE 29   Recent Labs  Lab 06/16/20 0421  AMMONIA 15    Coagulation Profile: No results for input(s): INR, PROTIME in the last 168 hours. Cardiac Enzymes: No results for input(s): CKTOTAL, CKMB, CKMBINDEX, TROPONINI in the last 168 hours. BNP (last 3 results) No results for input(s): PROBNP in the last 8760 hours. HbA1C: No results for input(s): HGBA1C in the last 72 hours. CBG: Recent Labs  Lab 06/19/20 1110 06/19/20 1651 06/19/20 2051 06/20/20 0636 06/20/20 1126  GLUCAP 113* 176* 106* 147* 111*   Lipid Profile: No results for input(s): CHOL, HDL, LDLCALC, TRIG, CHOLHDL, LDLDIRECT in the last 72 hours. Thyroid Function Tests: No results for input(s): TSH, T4TOTAL, FREET4, T3FREE, THYROIDAB in the last 72 hours. Anemia Panel: Recent Labs    06/20/20 1043  FERRITIN 112   Urine analysis:    Component Value  Date/Time   COLORURINE YELLOW 06/15/2020 1238   APPEARANCEUR CLEAR 06/15/2020 1238   LABSPEC <1.005 (L) 06/15/2020 1238   PHURINE 5.5 06/15/2020 1238   GLUCOSEU NEGATIVE 06/15/2020 1238   HGBUR TRACE (A) 06/15/2020 [place] 06/15/2020 1238   KETONESUR NEGATIVE 06/15/2020 1238   PROTEINUR NEGATIVE 06/15/2020 1238   NITRITE POSITIVE (A) 06/15/2020 1238   LEUKOCYTESUR TRACE (A) 06/15/2020 1238   Sepsis Labs: @LABRCNTIP (procalcitonin:4,lacticidven:4)  ) Recent Results (from the past 240 hour(s))  Urine culture     Status: Abnormal   Collection Time: 06/15/20 12:38 PM   Specimen: Urine, Clean Catch  Result Value Ref Range Status   Specimen Description   Final    URINE, CLEAN CATCH Performed at [facility], [address] [place], [place] [address]    Special Requests   Final    NONE Performed at [facility], [address] [place], [place] [address]    Culture (A)  Final    >=100,000 COLONIES/mL MULTIPLE SPECIES PRESENT, SUGGEST RECOLLECTION   Report Status 06/16/2020 FINAL  Final  SARS Coronavirus 2 by RT PCR (hospital order, performed in [facility] hospital lab)  Nasopharyngeal Urine, Clean Catch     Status: None   Collection Time: 06/15/20 12:38 PM   Specimen: Urine, Clean Catch; Nasopharyngeal  Result Value Ref Range Status   SARS Coronavirus 2 NEGATIVE NEGATIVE Final    Comment: (NOTE) SARS-CoV-2 target nucleic acids are NOT DETECTED.  The SARS-CoV-2 RNA is generally detectable in upper and lower respiratory specimens during the acute phase of infection. The lowest concentration of SARS-CoV-2 viral copies this assay can detect is 250 copies / mL. A negative result does not preclude SARS-CoV-2 infection and should not be used as the sole basis for treatment or other patient management decisions.  A negative result may occur with improper specimen collection / handling, submission of specimen other than nasopharyngeal swab, presence of viral mutation(s) within the areas targeted by this assay, and inadequate number of viral copies (<250 copies / mL). A negative result must be combined with clinical observations, patient history, and epidemiological information.  Fact Sheet for Patients:   StrictlyIdeas.no  Fact Sheet for Healthcare Providers: BankingDealers.co.za  This test is not yet approved or  cleared by the [place] FDA and has been authorized for detection and/or diagnosis of SARS-CoV-2 by FDA under an Emergency Use Authorization (EUA).  This EUA will remain in effect (meaning this test can be used) for the duration of the COVID-19 declaration under Section 564(b)(1) of the Act, 21 U.S.C. section 360bbb-3(b)(1), unless the authorization is terminated or revoked sooner.  Performed at [facility], [address] [place], [place] [address]   Blood culture (routine x 2)     Status: None   Collection Time: 06/15/20  1:55 PM   Specimen: BLOOD  Result Value Ref Range Status   Specimen Description   Final    BLOOD RIGHT ANTECUBITAL Performed at [facility],  [place] [address], [place], [place] [address]    Special Requests   Final    BOTTLES DRAWN AEROBIC AND ANAEROBIC Blood Culture adequate volume Performed at [facility], [address] [place], [place] [address]    Culture   Final    NO GROWTH 5 DAYS Performed at [facility], [place] [address] [place], [place] [address]    Report Status 06/20/2020 FINAL  Final  Blood culture (routine x 2)     Status: None   Collection Time: 06/15/20  1:55 PM   Specimen: BLOOD LEFT FOREARM  Result Value Ref Range Status   Specimen Description   Final    BLOOD LEFT FOREARM Performed at [facility], [address] [place], [place] [address]    Special Requests   Final    BOTTLES DRAWN AEROBIC AND ANAEROBIC Blood Culture adequate volume Performed at [facility], [address] [place], [place] [address]    Culture   Final    NO GROWTH 5 DAYS Performed at [facility], [place] [address], [place], [place] [address]    Report Status 06/20/2020 FINAL  Final      Studies: No results found.  Scheduled Meds: . aspirin EC  81 mg Oral QHS  . brinzolamide  1 drop Both Eyes BID  . cyanocobalamin  1,000 mcg Intramuscular Daily  . enoxaparin (LOVENOX) injection  40 mg Subcutaneous Q24H  . gabapentin  300 mg Oral QHS   And  . gabapentin  100 mg Oral BID  . insulin aspart  0-9 Units Subcutaneous TID WC  . melatonin  5 mg Oral QHS  . methylPREDNISolone  8 mg Oral BID  . pantoprazole  40 mg Oral Daily  . polyethylene glycol  17 g Oral Daily  . rOPINIRole  2 mg Oral QHS  . senna-docusate  1 tablet Oral BID  . sodium chloride flush  3 mL Intravenous Q12H    Continuous Infusions: . dextrose 5 % and 0.45% NaCl 10 mL/hr at 06/19/20 1014  . magnesium sulfate bolus IVPB       LOS: 5 days     Kayleen Memos, MD [facility] Pager [phone]  If 7PM-7AM, please contact night-coverage [email or web address] Password [email or web address] 06/20/2020, 1:50 PM

## 2020-06-20 NOTE — Progress Notes (Signed)
Physical Therapy Treatment Patient Details Name: Sonya Goodwin MRN: [id number] DOB: [birth year] Today's Date: 06/20/2020    History of Present Illness Pt is a 79 y/o female admitted secondary to fatigue and generalized weakness. Pt found to have acute encephalopathy with ?UTI. PMH including but not limited to sarcoidosis, adrenal insufficiency, chronic kidney disease stage IIIa, type 2 diabetes mellitus.    PT Comments    Patient progressing well towards PT goals. Cognition seems to be improved from prior session. Noted to still have some cognitive deficits relating to memory, awareness and safety-might be close to baseline cognitively. Tolerated gait training with Min guard assist and use of crutches for support; 2/4 DOE noted. Discussed recommendation of HHPT with pt, daughter and son. All agree. Encouraged OOB to chair for all meals and walking to bathroom. Will follow.   Follow Up Recommendations  Home health PT;Supervision for mobility/OOB     Equipment Recommendations  None recommended by PT    Recommendations for Other Services       Precautions / Restrictions Precautions Precautions: Fall Restrictions Weight Bearing Restrictions: No    Mobility  Bed Mobility Overal bed mobility: Needs Assistance Bed Mobility: Supine to Sit     Supine to sit: Supervision     General bed mobility comments: Supervision for safety, use of rails, no assist needed.  Transfers Overall transfer level: Needs assistance Equipment used: Crutches Transfers: Sit to/from Stand Sit to Stand: Min assist         General transfer comment: Min A to steady in standing due to first time being up in 5 days. Transferred to chair post ambulation.  Ambulation/Gait Ambulation/Gait assistance: Min guard Gait Distance (Feet): 175 Feet Assistive device: Crutches Gait Pattern/deviations: Step-to pattern;Step-through pattern;Decreased stride length Gait velocity: decreased   General Gait  Details: Slow, steady gait with 4 point gait pattern with crutches. 2/4 DOE. 2 standing rest breaks.   Stairs             Wheelchair Mobility    Modified Rankin (Stroke Patients Only)       Balance Overall balance assessment: Needs assistance Sitting-balance support: No upper extremity supported Sitting balance-Leahy Scale: Good Sitting balance - Comments: supervision for safety.   Standing balance support: During functional activity Standing balance-Leahy Scale: Poor Standing balance comment: requires Ue support, close min guard                            Cognition Arousal/Alertness: Awake/alert Behavior During Therapy: WFL for tasks assessed/performed Overall Cognitive Status: Impaired/Different from baseline Area of Impairment: Memory;Orientation;Safety/judgement;Awareness                 Orientation Level: Disoriented to;Time   Memory: Decreased short-term memory   Safety/Judgement: Decreased awareness of deficits Awareness: Anticipatory   General Comments: Needs to be redirected at times; very HOH. Able to get year with 2 options, but not month, "around August since that is when my daughter's bday is."      Exercises      General Comments General comments (skin integrity, edema, etc.): daughter present during session; pt's son placed on speaker at beginning of session and spoke to him via phone post session to provide recommendations.      Pertinent Vitals/Pain Pain Assessment: No/denies pain    Home Living                      Prior Function  PT Goals (current goals can now be found in the care plan section) Progress towards PT goals: Progressing toward goals    Frequency    Min 3X/week      PT Plan Discharge plan needs to be updated;Frequency needs to be updated    Co-evaluation              AM-PAC PT "6 Clicks" Mobility   Outcome Measure  Help needed turning from your back to your side  while in a flat bed without using bedrails?: None Help needed moving from lying on your back to sitting on the side of a flat bed without using bedrails?: None Help needed moving to and from a bed to a chair (including a wheelchair)?: A Little Help needed standing up from a chair using your arms (e.g., wheelchair or bedside chair)?: A Little Help needed to walk in hospital room?: A Little Help needed climbing 3-5 steps with a railing? : A Lot 6 Click Score: 19    End of Session Equipment Utilized During Treatment: Gait belt Activity Tolerance: Patient tolerated treatment well Patient left: in chair;with call bell/phone within reach;with family/visitor present Nurse Communication: Mobility status PT Visit Diagnosis: Other abnormalities of gait and mobility (R26.89)     Time: 9290-9030 PT Time Calculation (min) (ACUTE ONLY): 33 min  Charges:  $Gait Training: 8-22 mins $Therapeutic Activity: 8-22 mins                     Marisa Severin, PT, DPT Acute Rehabilitation Services Pager [phone] Office [phone]       Marguarite Arbour A Sabra Heck 06/20/2020, 12:42 PM

## 2020-06-21 LAB — BASIC METABOLIC PANEL
Anion gap: 14 (ref 5–15)
BUN: 22 mg/dL (ref 8–23)
CO2: 24 mmol/L (ref 22–32)
Calcium: 9 mg/dL (ref 8.9–10.3)
Chloride: 103 mmol/L (ref 98–111)
Creatinine, Ser: 0.99 mg/dL (ref 0.44–1.00)
GFR calc Af Amer: >60 mL/min (ref >60)
GFR calc non Af Amer: 54 mL/min — ABNORMAL LOW (ref >60)
Glucose, Bld: 122 mg/dL — ABNORMAL HIGH (ref 70–99)
Potassium: 4.1 mmol/L (ref 3.5–5.1)
Sodium: 141 mmol/L (ref 135–145)

## 2020-06-21 LAB — GLUCOSE, CAPILLARY
Glucose-Capillary: 114 mg/dL — ABNORMAL HIGH (ref 70–99)
Glucose-Capillary: 107 mg/dL — ABNORMAL HIGH (ref 70–99)

## 2020-06-21 LAB — VITAMIN B12: Vitamin B-12: >7500 pg/mL — ABNORMAL HIGH (ref 180–914)

## 2020-06-21 MED ORDER — ROSUVASTATIN CALCIUM 5 MG PO TABS
5.0000 mg | ORAL_TABLET | Freq: Every day | ORAL | Status: DC
  Administered 2020-06-21: 5 mg via ORAL
  Filled 2020-06-21: qty 1

## 2020-06-21 MED ORDER — VITAMIN D 25 MCG (1000 UNIT) PO TABS
2000.0000 [IU] | ORAL_TABLET | Freq: Every day | ORAL | Status: DC
  Administered 2020-06-21: 2000 [IU] via ORAL
  Filled 2020-06-21: qty 2

## 2020-06-21 MED ORDER — ADULT MULTIVITAMIN W/MINERALS CH
1.0000 | ORAL_TABLET | Freq: Every day | ORAL | Status: DC
  Administered 2020-06-21: 1 via ORAL
  Filled 2020-06-21: qty 1

## 2020-06-21 MED ORDER — GABAPENTIN 100 MG PO CAPS: 100.0000 mg | ORAL_CAPSULE | ORAL | 0 refills | Status: AC

## 2020-06-21 MED ORDER — VITAMIN D 50 MCG (2000 UT) PO CAPS: 1.0000 | ORAL_CAPSULE | Freq: Every day | ORAL | Status: DC

## 2020-06-21 MED ORDER — TRIAMTERENE-HCTZ 75-50 MG PO TABS
1.0000 | ORAL_TABLET | Freq: Every day | ORAL | Status: DC
  Administered 2020-06-21: 1 via ORAL
  Filled 2020-06-21: qty 1

## 2020-06-21 MED ORDER — PANTOPRAZOLE SODIUM 40 MG PO TBEC: 40.0000 mg | DELAYED_RELEASE_TABLET | Freq: Every day | ORAL | 0 refills | Status: AC

## 2020-06-21 MED ORDER — POLYETHYLENE GLYCOL 3350 17 G PO PACK: 17.0000 g | PACK | Freq: Every day | ORAL | 0 refills | Status: DC

## 2020-06-21 MED ORDER — AMITRIPTYLINE HCL 25 MG PO TABS: 25.0000 mg | ORAL_TABLET | Freq: Every day | ORAL | Status: DC

## 2020-06-21 NOTE — Progress Notes (Addendum)
Physical Therapy Note  Patient suffers from acute encephalopathy with possible UT  Ias well as PMH including but not limited to sarcoidosis, adrenal insufficiency, chronic kidney disease stage IIIa, type 2 diabetes mellitus, general deconditioning,  which impairs their ability to perform daily activities like walking, standing, bathing and dressing in the home.  A walker alone will not resolve the issues with performing activities of daily living. A lightweight wheelchair will allow patient to safely perform daily activities.  The patient can self propel in the home or has a caregiver who can provide assistance.     Sonya Goodwin, [place]  Acute Rehabilitation Services Pager [phone] Office [phone]

## 2020-06-21 NOTE — Plan of Care (Signed)
  Problem: Activity: Goal: Risk for activity intolerance will decrease Outcome: Progressing   

## 2020-06-21 NOTE — Care Management Important Message (Signed)
Important Message  Patient Details  Name: Sonya Goodwin MRN: [id number] Date of Birth: 1941-04-04   Medicare Important Message Given:  Yes     Elliemae Braman 06/21/2020, 1:40 PM

## 2020-06-21 NOTE — Progress Notes (Signed)
    Durable Medical Equipment  (From admission, onward)         Start     Ordered   06/21/20 1101  For home use only DME lightweight manual wheelchair with seat cushion  Once       Comments: Patient suffers from  encephalopathy  which impairs their ability to perform daily activities like walking ,standing , bathing ,dressing in the home.  A walker will not resolve  issue with performing activities of daily living. A wheelchair will allow patient to safely perform daily activities. Patient is not able to propel themselves in the home using a standard weight wheelchair due to weakness. Patient can self propel in the lightweight wheelchair. Length of need lifetime. Accessories: elevating leg rests (ELRs), wheel locks, extensions and anti-tippers.   06/21/20 1104

## 2020-06-21 NOTE — TOC Transition Note (Addendum)
Transition of Care [facility]) - CM/SW Discharge Note   Patient Details  Name: SYRETTA KOCHEL MRN: [id number] Date of Birth: [birth year]  [facility] of [facility]) CM/SW Contact:  Zenon Mayo, RN Phone Number: 06/21/2020, 11:11 AM   Clinical Narrative:    NCM spoke with son, he states he will be transporting patient, home, NCM offered choice, he does not have a preference of agency.  NCM made referral to [place] with [facility] for [place], [place], [place], [place]  and if possible SW.  NCM also made referral to [facility] with [employer] for light weight wheelchair, which will be brought to patient's room prior to dc.  Awaiting call back from  [place] with [facility] .  [place] with [facility] states they are able to take referral.   Final next level of care: [place] Barriers to Discharge: No Barriers Identified   Patient Goals and CMS Choice Patient states their goals for this hospitalization and ongoing recovery are:: to go home with home health servcices CMS Medicare.gov Compare Post Acute Care list provided to:: Patient Represenative (must comment) Choice offered to / list presented to : Adult Children  Discharge Placement                       Discharge Plan and Services                DME Arranged: Lightweight manual wheelchair with seat cushion DME Agency: [employer] Date DME Agency Contacted: 06/21/20 Time DME Agency Contacted: 1109 Representative spoke with at DME Agency: [place]: PT,OT,RN,aide, [place] Agency: [facility] Date [place]: 06/21/20 Time [place]: 1111 Representative spoke with at [place]: [place] ([place]) Interventions     Readmission Risk Interventions No flowsheet data found.

## 2020-06-21 NOTE — Discharge Instructions (Signed)
Urinary Tract Infection, Adult A urinary tract infection (UTI) is an infection of any part of the urinary tract. The urinary tract includes:  The kidneys.  The ureters.  The bladder.  The urethra. These organs make, store, and get rid of pee (urine) in the body. What are the causes? This is caused by germs (bacteria) in your genital area. These germs grow and cause swelling (inflammation) of your urinary tract. What increases the risk? You are more likely to develop this condition if:  You have a small, thin tube (catheter) to drain pee.  You cannot control when you pee or poop (incontinence).  You are female, and: ? You use these methods to prevent pregnancy:  A medicine that kills sperm (spermicide).  A device that blocks sperm (diaphragm). ? You have low levels of a female hormone (estrogen). ? You are pregnant.  You have genes that add to your risk.  You are sexually active.  You take antibiotic medicines.  You have trouble peeing because of: ? A prostate that is bigger than normal, if you are female. ? A blockage in the part of your body that drains pee from the bladder (urethra). ? A kidney stone. ? A nerve condition that affects your bladder (neurogenic bladder). ? Not getting enough to drink. ? Not peeing often enough.  You have other conditions, such as: ? Diabetes. ? A weak disease-fighting system (immune system). ? Sickle cell disease. ? Gout. ? Injury of the spine. What are the signs or symptoms? Symptoms of this condition include:  Needing to pee right away (urgently).  Peeing often.  Peeing small amounts often.  Pain or burning when peeing.  Blood in the pee.  Pee that smells bad or not like normal.  Trouble peeing.  Pee that is cloudy.  Fluid coming from the vagina, if you are female.  Pain in the belly or lower back. Other symptoms include:  Throwing up (vomiting).  No urge to eat.  Feeling mixed up (confused).  Being tired  and grouchy (irritable).  A fever.  Watery poop (diarrhea). How is this treated? This condition may be treated with:  Antibiotic medicine.  Other medicines.  Drinking enough water. Follow these instructions at home:  Medicines  Take over-the-counter and prescription medicines only as told by your doctor.  If you were prescribed an antibiotic medicine, take it as told by your doctor. Do not stop taking it even if you start to feel better. General instructions  Make sure you: ? Pee until your bladder is empty. ? Do not hold pee for a long time. ? Empty your bladder after sex. ? Wipe from front to back after pooping if you are a female. Use each tissue one time when you wipe.  Drink enough fluid to keep your pee pale yellow.  Keep all follow-up visits as told by your doctor. This is important. Contact a doctor if:  You do not get better after 1-2 days.  Your symptoms go away and then come back. Get help right away if:  You have very bad back pain.  You have very bad pain in your lower belly.  You have a fever.  You are sick to your stomach (nauseous).  You are throwing up. Summary  A urinary tract infection (UTI) is an infection of any part of the urinary tract.  This condition is caused by germs in your genital area.  There are many risk factors for a UTI. These include having a small, thin   tube to drain pee and not being able to control when you pee or poop.  Treatment includes antibiotic medicines for germs.  Drink enough fluid to keep your pee pale yellow. This information is not intended to replace advice given to you by your health care provider. Make sure you discuss any questions you have with your health care provider. Document Revised: 12/03/2018 Document Reviewed: 06/25/2018 Elsevier Patient Education  [place] refers to the changes in the body that occur during a period of inactivity. The changes  happen in the heart, lungs, and muscles. They make you feel tired and weak (fatigued) and decrease your ability to be active. The three stages of deconditioning include:  Mild deconditioning. This is a change in your ability to do your usual exercise activities, such as running, biking, or swimming.  Moderate deconditioning. This is a change in your ability to do normal everyday activities, such as walking, shopping for groceries, and doing chores.  Severe deconditioning. In this stage, you may not be able to do minimal activity or usual self-care. What are the causes? Deconditioning can occur after only a few days of inactivity. The longer the period of inactivity, the more severe the deconditioning will be, and the longer it will take to return to your previous level of functioning. Deconditioning is often caused by inactivity due to:  Illnesses, such as cancer, stroke, heart attack, fibromyalgia, and chronic fatigue syndrome.  Injuries, especially back injuries, broken bones, and injuries to soft tissues, such as ligaments and tendons.  A long stay in the hospital.  Pregnancy, especially if long periods of bed rest are needed. What increases the risk? The following factors may make you more likely to develop this condition:  Staying in the hospital or being on bed rest.  Obesity.  Poor nutrition.  Being an older adult.  Having an injury or illness that affects your movement and activity. What are the signs or symptoms? Symptoms of this condition include:  Weakness and tiredness.  Shortness of breath with minor physical effort (exertion).  A heartbeat that is faster than normal. You may not notice this without taking your pulse.  Pain or discomfort with activity.  Decreased strength, endurance, and balance.  Difficulty doing your usual forms of exercise.  Difficulty doing activities of daily living, such as grocery shopping or chores. You may also have problems walking  around the house and doing basic self-care, such as getting to the bathroom, preparing meals, or doing laundry. How is this diagnosed? This condition is diagnosed based on your medical history and a physical exam. During the physical exam, your health care provider will check for signs of deconditioning, such as:  Decreased size of muscles.  Decreased strength.  Trouble with balance.  Shortness of breath or a heart rate that is faster than normal after minor exertion. How is this treated? Treatment for this condition involves an exercise program in which activity is increased slowly. Your health care provider will tell you which exercises are right for you. The exercise program will likely include:  Aerobic exercise. This type of exercise helps improve the functioning of the heart, lungs, and muscles.  Strength training. This type of exercise helps increase muscle size and strength. Both of these types of exercise will improve your endurance. You may be referred to a physical therapist who can create a safe strengthening program for you to follow. Follow these instructions at home: Eating and drinking   Eat a  healthy, well-balanced diet. This includes: ? Proteins, such as lean meats and fish, to build muscles. ? Fresh fruits and vegetables. ? Carbohydrates, such as whole grains, to boost energy.  Drink enough fluid to keep your urine pale yellow. Activity   Follow the exercise program that is recommended by your health care provider or physical therapist.  Do not increase your exercise any faster than directed. General instructions  Take over-the-counter and prescription medicines only as told by your health care provider.  Do not use any products that contain nicotine or tobacco, such as cigarettes, e-cigarettes, and chewing tobacco. If you need help quitting, ask your health care provider.  Keep all follow-up visits as told by your health care provider. This is  important. Contact a health care provider if:  You are not able to do the recommended exercise program.  You are becoming more and more tired and weak.  You become light-headed when rising to a sitting or standing position.  Your level of endurance decreases after it has improved. Get help right away if you:  Have chest pain.  Are very short of breath.  Have any episodes of fainting. Summary  Deconditioning refers to the changes in the body that occur during a period of inactivity.  Deconditioning happens in the heart, lungs, and muscles. The changes make you feel tired and weak and decrease your ability to be active.  Treatment for deconditioning involves an exercise program in which activity is increased slowly. This information is not intended to replace advice given to you by your health care provider. Make sure you discuss any questions you have with your health care provider. Document Revised: 05/13/2019 Document Reviewed: 05/13/2019 Elsevier Patient Education  [place].  Addison's Disease  Addison's disease, which is also called primary adrenal insufficiency, is a condition in which the adrenal glands do not make enough of the hormones cortisol and aldosterone. The disease causes blood pressure to drop and causes potassium to build up to dangerous levels. If Addison's disease is not treated, it can suddenly get worse and become life-threatening. This is called an adrenal crisis (Addisonian crisis). What are the causes? This condition may be caused by:  A disease in which the body's immune system damages the adrenal glands.  An infection of the adrenal glands.  Bleeding (hemorrhage) in the adrenal glands.  A tumor. What are the signs or symptoms? Common symptoms of this condition include:  Severe fatigue.  Muscle weakness.  Loss of appetite.  Weight loss.  Darkening of the skin.  Low blood pressure (hypotension). Other symptoms  include:  Nausea or vomiting.  Diarrhea.  Dizziness or fainting.  Irritability  Depression.  Salt cravings.  Low blood sugar (hypoglycemia).  Irregular or no menstrual periods. Symptoms usually develop slowly and get worse gradually. How is this diagnosed? This condition may be diagnosed based on your:  Medical history.  Symptoms.  Lab test results. Lab tests include a measurement of your blood cortisol levels.  Imaging tests results. You may have a CT scan of the adrenal glands. How is this treated? This condition cannot be cured, but it can be managed with medicines that replace cortisol and aldosterone. You may need to take these medicines:  Several times a day, by mouth.  Through an injection if you become so sick that you are unable to take these medicines by mouth or you are unable to keep them down. Illness, stress, and surgery can increase your body's need for cortisol. It is  very important that you talk with your health care provider and understand how to adjust your medicine dosages if you become ill, stressed, or if you are going to have surgery. Follow these instructions at home:  Keep all follow-up visits as told by your health care provider. This is important. Medicines  Know how to increase your medicine dosage during periods of stress, mild illness, or surgery.  Take over-the-counter and prescription medicines only as told by your health care provider. General instructions   When you travel, carry a needle, a syringe, and an injectable form of cortisol in case of an emergency.  In case of an emergency, wear a medical alert bracelet or neck chain so that others understand that you have Addison's disease.  Carry an ID card that states that you have Addison's disease. The card should include: ? Instructions to inject a certain amount of medicine if you are severely hurt or cannot respond. ? The name and phone number of your health care provider. ? The  name and phone number of your closest relative. Contact a health care provider if:  You get sick with another illness.  You develop new symptoms. Get help right away if:  You have a severe infection or other illness.  You have severe vomiting or diarrhea.  You find it necessary to give yourself injectable medicine.  You have symptoms of an Addisoniancrisis. These symptoms include: ? Sudden, severe pain in the lower back, abdomen, or legs. ? Severe vomiting and diarrhea. ? Dehydration. ? Low blood pressure. ? Loss of consciousness. Summary  Addison's disease is the inability of the adrenal glands to make hormones that regulate everyday functions of the body.  If untreated, this condition can lead to life-threatening problems.  It is very important that you talk with your health care provider and understand how to adjust your medicine dosages if you become ill, stressed, or if you are going to have surgery.  Take over-the-counter and prescription medicines only as told by your health care provider.  Keep all follow-up visits as told by your health care provider. This is important. This information is not intended to replace advice given to you by your health care provider. Make sure you discuss any questions you have with your health care provider. Document Revised: 09/10/2018 Document Reviewed: 09/10/2018 Elsevier Patient Education  [place].  Dehydration, Adult Dehydration is condition in which there is not enough water or other fluids in the body. This happens when a person loses more fluids than he or she takes in. Important body parts cannot work right without the right amount of fluids. Any loss of fluids from the body can cause dehydration. Dehydration can be mild, worse, or very bad. It should be treated right away to keep it from getting very bad. What are the causes? This condition may be caused by:  Conditions that cause loss of water or other fluids, such  as: ? Watery poop (diarrhea). ? Vomiting. ? Sweating a lot. ? Peeing (urinating) a lot.  Not drinking enough fluids, especially when you: ? Are ill. ? Are doing things that take a lot of energy to do.  Other illnesses and conditions, such as fever or infection.  Certain medicines, such as medicines that take extra fluid out of the body (diuretics).  Lack of safe drinking water.  Not being able to get enough water and food. What increases the risk? The following factors may make you more likely to develop this condition:  Having  a long-term (chronic) illness that has not been treated the right way, such as: ? Diabetes. ? Heart disease. ? Kidney disease.  Being 34 years of age or older.  Having a disability.  Living in a place that is high above the ground or sea (high in altitude). The thinner, dried air causes more fluid loss.  Doing exercises that put stress on your body for a long time. What are the signs or symptoms? Symptoms of dehydration depend on how bad it is. Mild or worse dehydration  Thirst.  Dry lips or dry mouth.  Feeling dizzy or light-headed, especially when you stand up from sitting.  Muscle cramps.  Your body making: ? Dark pee (urine). Pee may be the color of tea. ? Less pee than normal. ? Less tears than normal.  Headache. Very bad dehydration  Changes in skin. Skin may: ? Be cold to the touch (clammy). ? Be blotchy or pale. ? Not go back to normal right after you lightly pinch it and let it go.  Little or no tears, pee, or sweat.  Changes in vital signs, such as: ? Fast breathing. ? Low blood pressure. ? Weak pulse. ? Pulse that is more than 100 beats a minute when you are sitting still.  Other changes, such as: ? Feeling very thirsty. ? Eyes that look hollow (sunken). ? Cold hands and feet. ? Being mixed up (confused). ? Being very tired (lethargic) or having trouble waking from sleep. ? Short-term weight loss. ? Loss of  consciousness. How is this treated? Treatment for this condition depends on how bad it is. Treatment should start right away. Do not wait until your condition gets very bad. Very bad dehydration is an emergency. You will need to go to a hospital.  Mild or worse dehydration can be treated at home. You may be asked to: ? Drink more fluids. ? Drink an oral rehydration solution (ORS). This drink helps get the right amounts of fluids and salts and minerals in the blood (electrolytes).  Very bad dehydration can be treated: ? With fluids through an IV tube. ? By getting normal levels of salts and minerals in your blood. This is often done by giving salts and minerals through a tube. The tube is passed through your nose and into your stomach. ? By treating the root cause. Follow these instructions at home: Oral rehydration solution If told by your doctor, drink an ORS:  Make an ORS. Use instructions on the package.  Start by drinking small amounts, about  cup (120 mL) every 5-10 minutes.  Slowly drink more until you have had the amount that your doctor said to have. Eating and drinking         Drink enough clear fluid to keep your pee pale yellow. If you were told to drink an ORS, finish the ORS first. Then, start slowly drinking other clear fluids. Drink fluids such as: ? Water. Do not drink only water. Doing that can make the salt (sodium) level in your body get too low. ? Water from ice chips you suck on. ? Fruit juice that you have added water to (diluted). ? Low-calorie sports drinks.  Eat foods that have the right amounts of salts and minerals, such as: ? Bananas. ? Oranges. ? Potatoes. ? Tomatoes. ? Spinach.  Do not drink alcohol.  Avoid: ? Drinks that have a lot of sugar. These include:  High-calorie sports drinks.  Fruit juice that you did not add water to.  Soda.  Caffeine. ? Foods that are greasy or have a lot of fat or sugar. General instructions  Take  over-the-counter and prescription medicines only as told by your doctor.  Do not take salt tablets. Doing that can make the salt level in your body get too high.  Return to your normal activities as told by your doctor. Ask your doctor what activities are safe for you.  Keep all follow-up visits as told by your doctor. This is important. Contact a doctor if:  You have pain in your belly (abdomen) and the pain: ? Gets worse. ? Stays in one place.  You have a rash.  You have a stiff neck.  You get angry or annoyed (irritable) more easily than normal.  You are more tired or have a harder time waking than normal.  You feel: ? Weak or dizzy. ? Very thirsty. Get help right away if you have:  Any symptoms of very bad dehydration.  Symptoms of vomiting, such as: ? You cannot eat or drink without vomiting. ? Your vomiting gets worse or does not go away. ? Your vomit has blood or green stuff in it.  Symptoms that get worse with treatment.  A fever.  A very bad headache.  Problems with peeing or pooping (having a bowel movement), such as: ? Watery poop that gets worse or does not go away. ? Blood in your poop (stool). This may cause poop to look black and tarry. ? Not peeing in 6-8 hours. ? Peeing only a small amount of very dark pee in 6-8 hours.  Trouble breathing. These symptoms may be an emergency. Do not wait to see if the symptoms will go away. Get medical help right away. Call your local emergency services (911 in the [place] Do not drive yourself to the hospital. Summary  Dehydration is a condition in which there is not enough water or other fluids in the body. This happens when a person loses more fluids than he or she takes in.  Treatment for this condition depends on how bad it is. Treatment should be started right away. Do not wait until your condition gets very bad.  Drink enough clear fluid to keep your pee pale yellow. If you were told to drink an oral  rehydration solution (ORS), finish the ORS first. Then, start slowly drinking other clear fluids.  Take over-the-counter and prescription medicines only as told by your doctor.  Get help right away if you have any symptoms of very bad dehydration. This information is not intended to replace advice given to you by your health care provider. Make sure you discuss any questions you have with your health care provider. Document Revised: 07/29/2019 Document Reviewed: 07/29/2019 Elsevier Patient Education  [place].

## 2020-06-21 NOTE — Progress Notes (Signed)
Physical Therapy Treatment Patient Details Name: Sonya Goodwin MRN: [id number] DOB: 06/05/1941 Today's Date: 06/21/2020    History of Present Illness Pt is a 79 y/o female admitted secondary to fatigue and generalized weakness. Pt found to have acute encephalopathy with ?UTI. PMH including but not limited to sarcoidosis, adrenal insufficiency, chronic kidney disease stage IIIa, type 2 diabetes mellitus.    PT Comments    Continuing work on functional mobility and activity tolerance;  Session focused on functional transfer, gait, and planning for dc home; Pt requires min/minguard assist for bed mobiltiy and transfers, Supervision for gait with bil axillary crutches (she has used the crutches for years, and is uninterested in trying RW); we discussed car transfers, placing a chair at the top of her ramp in case of need for a rest break, plans for assist for pt and Goodwin to get into home;   Daughter present during session; Daughter expressed concern about Sonya Goodwin's Goodwin to manage at home, and indicated if there was a need for a higher level of care, she wouldn't know where to start; Provided listening and support; worth considering maximizing HH services   Follow Up Recommendations  Home health PT;Supervision for mobility/OOB (Maximize HH services; consider HHSW)  Notified MD and TOC CM of updated recs     Equipment Recommendations  Wheelchair (measurements PT)    Recommendations for Other Services       Precautions / Restrictions Precautions Precautions: Fall    Mobility  Bed Mobility Overal bed mobility: Needs Assistance Bed Mobility: Supine to Sit     Supine to sit: Supervision     General bed mobility comments: Supervision for safety, use of rails, no assist needed.  Transfers Overall transfer level: Needs assistance Equipment used: Crutches Transfers: Sit to/from Stand Sit to Stand: Min guard         General transfer comment: Minguard  for safety, but did not need physical assist; Stood twice from EOB; reported shakier than yesterday, but still able to complete the task  Ambulation/Gait Ambulation/Gait assistance: Min guard (without physical contact) Gait Distance (Feet): 80 Feet Assistive device: Crutches Gait Pattern/deviations: Step-to pattern;Step-through pattern;Decreased stride length Gait velocity: decreased   General Gait Details: Slow, steady gait with 4 point gait pattern with crutches. No need for standing rest break   Stairs             Wheelchair Mobility    Modified Rankin (Stroke Patients Only)       Balance     Sitting balance-Leahy Scale: Good       Standing balance-Leahy Scale: Poor                              Cognition Arousal/Alertness: Awake/alert Behavior During Therapy: WFL for tasks assessed/performed Overall Cognitive Status: Within Functional Limits for tasks assessed (for simple mobiltiy tasks) Area of Impairment: Memory                                      Exercises      General Comments General comments (skin integrity, edema, etc.): Daughter present during session; Daughter expressed concern about Sonya Goodwin to manage at home, and indicated if there was a need for a higher level of care, she wouldn't know where to start; Provided listening and support; worth considering maximizing [facility]  serivces      Pertinent Vitals/Pain Pain Assessment: Faces Faces Pain Scale: Hurts a little bit Pain Location: mild, but notable L knee pain with progressive amb Pain Descriptors / Indicators: Aching Pain Intervention(s): Monitored during session    Home Living                      Prior Function            PT Goals (current goals can now be found in the care plan section) Acute Rehab PT Goals Patient Stated Goal: Hopes to be going home today PT Goal Formulation: Patient unable to participate in goal  setting Time For Goal Achievement: 07/01/20 Potential to Achieve Goals: Fair Progress towards PT goals: Progressing toward goals    Frequency    Min 3X/week      PT Plan Current plan remains appropriate    Co-evaluation              AM-PAC PT "6 Clicks" Mobility   Outcome Measure  Help needed turning from your back to your side while in a flat bed without using bedrails?: None Help needed moving from lying on your back to sitting on the side of a flat bed without using bedrails?: None Help needed moving to and from a bed to a chair (including a wheelchair)?: A Little Help needed standing up from a chair using your arms (e.g., wheelchair or bedside chair)?: A Little Help needed to walk in hospital room?: A Little Help needed climbing 3-5 steps with a railing? : A Lot 6 Click Score: 19    End of Session Equipment Utilized During Treatment: Gait belt Activity Tolerance: Patient tolerated treatment well Patient left: in chair;with call bell/phone within reach;with family/visitor present Nurse Communication: Mobility status PT Visit Diagnosis: Other abnormalities of gait and mobility (R26.89)     Time: 9381-0175 PT Time Calculation (min) (ACUTE ONLY): 40 min  Charges:  $Gait Training: 8-22 mins $Therapeutic Activity: 23-37 mins                     Roney Marion, PT  Acute Rehabilitation Services Pager [phone] Office [phone]    Colletta [place] 06/21/2020, 11:23 AM

## 2020-06-21 NOTE — Discharge Summary (Signed)
Discharge Summary  Sonya Goodwin OHY:[id number] DOB: November 16, 1941  PCP: Hulan Fess, MD  Admit date: 06/15/2020 Discharge date: 06/21/2020  Time spent: 35 minutes   Recommendations for Outpatient Follow-up:  1. Follow up with your PCP 2. Follow up with your endocrinologist within a week. 3. Follow-up with your pulmonologist 4. Continue PT OT with assistance and fall precautions  Discharge Diagnoses:  Active Hospital Problems   Diagnosis Date Noted  . Acute encephalopathy 06/15/2020  . UTI (urinary tract infection) 06/15/2020  . Hypokalemia 06/15/2020  . Chronic kidney disease, stage 3a   . Type 2 diabetes mellitus with stage 3 chronic kidney disease ([place])   . Adrenal insufficiency ([place])   . Sarcoidosis     Resolved Hospital Problems  No resolved problems to display.    Discharge Condition: Stable   Diet recommendation: Resume previous diet   Vitals:   06/21/20 0459 06/21/20 0903  BP: 130/67 (!) 151/58  Pulse: (!) 55 81  Resp: 15 18  Temp: 97.6 F (36.4 C) 97.8 F (36.6 C)  SpO2: 91% 97%    History of present illness:  Sonya Goodwin an 79 y.o.femalewith PMH significant for sarcoidosis, adrenal insufficiency, chronic kidney disease stage IIIA, diabetes mellitus type 2 presents for evaluation of nausea, fatigue and generalized weakness. She was feeling weak so she took multiple tablets of Medrol prior to admission.  She was admitted for acute metabolic encephalopathy likely secondary to polypharmacy and dehydration.  06/21/20: Seen and examined with her daughter at bedside.    She feels better this morning.  She is back to her baseline.  She is alert and oriented and eager to go home.     Hospital Course:  Principal Problem:   Acute encephalopathy Active Problems:   Sarcoidosis   Adrenal insufficiency (HCC)   Chronic kidney disease, stage 3a   Type 2 diabetes mellitus with stage 3 chronic kidney disease (HCC)   UTI (urinary tract infection)    Hypokalemia  Resolved acute metabolic encephalopathy likely multifactorial secondary to polypharmacy, presumed UTI, dehydration, and B12 deficiency.         She is back to her baseline mentation. Presented with confusion, pyuria, dehydration, B12 deficiency in the setting of unintentional inappropriate use of her home meds. Had taken multiple doses of her Medrol tablets. Received IV fluid hydration, B12 injection, 1 dose of fosfomycin for presumed UTI. Steroids titrated back to her home dose regimen. Received PT OT with assistance and fall precautions: Recommendation for home health PT OT.  Polypharmacy with concerns for inability to properly manage her own medications  She will benefit from home health RN Daughter and son are hopeful that [name] and [place] and her daughter.  Presumed UTI: UTI cannot be excluded. Urine culture taken on 06/15/20 grew > 100, colonies multiple species present. Treated with 1 dose of fosfomycin  Essential hypertension Blood pressure was initially soft and antihypertensives were held. Blood pressure is trending up Resume home regimen with Triamterene and HCTZ Take a potassium supplement while on HCTZ, 10 mEq daily, to avoid hypokalemia. Follow-up with your PCP for periodic monitoring of serum sodium while on HCTZ  Resolved Vitamin B12 deficiency: B12 was repleted intramuscularly. Hold off B12 replacement Follow-up with your PCP  Restless leg syndrome/polyneuropathy Continue ropinirole, gabapentin Ferritin level 112 on 06/20/2020  Adrenal insufficiency/Sarcoidosis Blood pressure is at goal Continue home dose of Medrol 4 mg twice daily Continue PPI for  GI prophylaxis TSH normal at 0.568 on 06/16/2020. B12 repleted Follow-up with your endocrinologist within a week  Resolved post repletion: Hypokalemia: Serum potassium 4.1 Serum magnesium 1.9. Resume potassium supplement as prescribed 10  mEq daily when on HCTZ, a diuretic.  Diabetes mellitus type 2 with hyperglycemia likely exacerbated by steroids: Hemoglobin A1c of 6.3 on 06/16/2020  Resume home regimen and follow-up with your PCP.  Hyperlipidemia Resume home Crestor  Insomnia Resume amitriptyline Follow-up with your PCP  Chronic kidney disease stage IIIA: Baseline creatinine appears to be 0.9 with GFR of 58 Renal function back to baseline Avoid dehydration Follow-up with your PCP  Resolved nausea and vomiting: Tolerating her diet well Continue to encourage oral intake  Resolved constipation Last bowel movement this morning Continue bowel regimen as needed  Ambulatory dysfunction/physical debility PT assessment recommended home health PT  Continue PT OT with assistance and fall precautions DME wheelchair.     Code Status: Full code.    Procedures:  None  Consultations:  None  Discharge Exam: BP (!) 151/58 (BP Location: Left Arm)   Pulse 81   Temp 97.8 F (36.6 C) (Oral)   Resp 18   Wt 62.9 kg   SpO2 97%   BMI 23.08 kg/m  . General: 79 y.o. year-old female well developed well nourished in no acute distress.  Alert and oriented x3. . Cardiovascular: Regular rate and rhythm with no rubs or gallops.  No thyromegaly or JVD noted.   Marland Kitchen Respiratory: Clear to auscultation with no wheezes or rales. Good inspiratory effort. . Abdomen: Soft nontender nondistended with normal bowel sounds x4 quadrants. . Musculoskeletal: No lower extremity edema.  Marland Kitchen Psychiatry: Mood is appropriate for condition and setting  Discharge Instructions You were cared for by a hospitalist during your hospital stay. If you have any questions about your discharge medications or the care you received while you were in the hospital after you are discharged, you can call the unit and asked to speak with the hospitalist on call if the hospitalist that took care of you is not available. Once you are discharged, your  primary care physician will handle any further medical issues. Please note that NO REFILLS for any discharge medications will be authorized once you are discharged, as it is imperative that you return to your primary care physician (or establish a relationship with a primary care physician if you do not have one) for your aftercare needs so that they can reassess your need for medications and monitor your lab values.   Allergies as of 06/21/2020      Reactions   Metoclopramide Other (See Comments)   unknown   Codeine Nausea And Vomiting   Pt reports that all opiates "make me violently ill".   Other Itching, Rash   Food-cranberries   Pain Patch [menthol] Nausea And Vomiting   Statins Other (See Comments)   Muscle wasting, attempted Crestor 10mg  biw, then decreased to qw, but still had muscle problems.  Re-challenged after off x several weeks, problem resumed.  Muscle wasting, attempted Crestor 10mg  biw, then decreased to qw, but still had muscle problems.  Re-challenged after off x several weeks, problem resumed.    Alendronate Other (See Comments)   Stomach pain   Aspirin Tinitus   Duragesic-100 [fentanyl] Tinitus   Duragesic patch   Fenofibrate Other (See Comments)   Arthralgias and myalgias   Fosamax [alendronate Sodium] Other (See Comments)   Stomach pain   Losartan Other (See Comments)   Tremors  Miacalcin [calcitonin (salmon)] Other (See Comments)   unknown   Nitrofurantoin Other (See Comments)   Tremors   Nsaids Other (See Comments)   Avoid with CKD III   Prolia [denosumab] Other (See Comments)   Due to kidney function   Sulfamethoxazole-trimethoprim Nausea And Vomiting   Tolmetin Other (See Comments)   Avoid with CKD III Avoid with CKD III   Welchol [colesevelam] Other (See Comments)   Myalgias   Zetia [ezetimibe]    Arthralgias and myalgias   Lovaza [omega-3-acid Ethyl Esters] Palpitations      Medication List    TAKE these medications   amitriptyline 25 MG  tablet Commonly known as: ELAVIL Take 25 mg by mouth at bedtime.   aspirin 81 MG chewable tablet Chew 81 mg by mouth daily.   dorzolamide 2 % ophthalmic solution Commonly known as: TRUSOPT Place 1 drop into both eyes 2 (two) times daily.   FISH OIL PO Take 2 capsules by mouth daily.   gabapentin 100 MG capsule Commonly known as: NEURONTIN Take 1 capsule (100 mg total) by mouth See admin instructions. Take 100mg  in the morning, 100mg  in the afternoon and 300mg  at bedtime. What changed:   additional instructions  Another medication with the same name was removed. Continue taking this medication, and follow the directions you see here.   meclizine 25 MG tablet Commonly known as: ANTIVERT Take 25 mg by mouth 2 (two) times daily as needed for dizziness or nausea.   methylPREDNISolone 4 MG tablet Commonly known as: MEDROL Take 4 mg by mouth See admin instructions. Take 4mg  twice daily. Can take up to 32mg  as needed when ill.   multivitamin with minerals Tabs tablet Take 1 tablet by mouth daily.   ONE TOUCH ULTRA TEST test strip Generic drug: glucose blood Use to test your blood sugar once daily as directed   pantoprazole 40 MG tablet Commonly known as: PROTONIX Take 1 tablet (40 mg total) by mouth daily. Start taking on: June 22, 2020 What changed:   when to take this  reasons to take this   pioglitazone 15 MG tablet Commonly known as: ACTOS Take 15 mg by mouth daily.   polyethylene glycol 17 g packet Commonly known as: MIRALAX / GLYCOLAX Take 17 g by mouth daily. Start taking on: June 22, 2020   potassium chloride 10 MEQ tablet Commonly known as: KLOR-CON Take 1 tablet (10 mEq total) by mouth daily. What changed: how much to take   rOPINIRole 2 MG tablet Commonly known as: REQUIP Take 2 mg by mouth at bedtime.   rosuvastatin 5 MG tablet Commonly known as: CRESTOR Take 5 mg by mouth daily.   traMADol 50 MG tablet Commonly known as: ULTRAM Take 1  tablet (50 mg total) by mouth every 6 (six) hours as needed for moderate pain.   triamterene-hydrochlorothiazide 75-50 MG tablet Commonly known as: MAXZIDE Take 1 tablet by mouth daily.   Vitamin D 50 MCG (2000 UT) Caps Take 1 capsule by mouth daily.            Durable Medical Equipment  (From admission, onward)         Start     Ordered   06/21/20 1101  For home use only DME lightweight manual wheelchair with seat cushion  Once       Comments: Patient suffers from  encephalopathy  which impairs their ability to perform daily activities like walking ,standing , bathing ,dressing in the home.  A walker will  not resolve  issue with performing activities of daily living. A wheelchair will allow patient to safely perform daily activities. Patient is not able to propel themselves in the home using a standard weight wheelchair due to weakness. Patient can self propel in the lightweight wheelchair. Length of need lifetime. Accessories: elevating leg rests (ELRs), wheel locks, extensions and anti-tippers.   06/21/20 1104         Allergies  Allergen Reactions  . Metoclopramide Other (See Comments)    unknown  . Codeine Nausea And Vomiting    Pt reports that all opiates "make me violently ill".  . Other Itching and Rash    Food-cranberries  . Pain Patch [Menthol] Nausea And Vomiting  . Statins Other (See Comments)    Muscle wasting, attempted Crestor 10mg  biw, then decreased to qw, but still had muscle problems.  Re-challenged after off x several weeks, problem resumed.  Muscle wasting, attempted Crestor 10mg  biw, then decreased to qw, but still had muscle problems.  Re-challenged after off x several weeks, problem resumed.   . Alendronate Other (See Comments)    Stomach pain  . Aspirin Tinitus  . Duragesic-100 [Fentanyl] Tinitus    Duragesic patch  . Fenofibrate Other (See Comments)    Arthralgias and myalgias  . Fosamax [Alendronate Sodium] Other (See Comments)    Stomach  pain  . Losartan Other (See Comments)    Tremors  . Miacalcin [Calcitonin ([place])] Other (See Comments)    unknown  . Nitrofurantoin Other (See Comments)    Tremors  . Nsaids Other (See Comments)    Avoid with CKD III  . Prolia [Denosumab] Other (See Comments)    Due to kidney function   . Sulfamethoxazole-Trimethoprim Nausea And Vomiting  . Tolmetin Other (See Comments)    Avoid with CKD III Avoid with CKD III  . Welchol [Colesevelam] Other (See Comments)    Myalgias  . Zetia [Ezetimibe]     Arthralgias and myalgias  . Lovaza [Omega-3-Acid Ethyl Esters] Palpitations    Follow-up Information    Hulan Fess, MD. Call in 1 day(s).   Specialty: Family Medicine Why: Please call for a post hospital follow-up appointment. Contact information: [place] [address] [phone]        Nonah Mattes, MD. Call in 1 day(s).   Specialty: Internal Medicine Why: Please call for a post hospital follow-up appointment. Contact information: [facility] [address] [place] [address] [facility] Follow up.   Why: wheelchair Contact information: [place] [address] [place], [place] Follow up.   Specialty: Home Health Services Why: HHPT Contact information: [place] [address] [phone]                The results of significant diagnostics from this hospitalization (including imaging, microbiology, ancillary and laboratory) are listed below for reference.    Significant Diagnostic Studies: DG Chest Portable 1 View  Result Date: 06/15/2020 CLINICAL DATA:  Fatigue, weakness EXAM: PORTABLE CHEST 1 VIEW COMPARISON:  02/05/2017 FINDINGS: Single frontal view of the chest demonstrates a stable cardiac silhouette. Marked calcification of the mitral annulus. No airspace disease, effusion, or pneumothorax.  IMPRESSION: 1. Stable exam, no acute process. Electronically Signed   By: Randa Ngo M.D.   On: 06/15/2020 15:03    Microbiology: Recent  Results (from the past 240 hour(s))  Urine culture     Status: Abnormal   Collection Time: 06/15/20 12:38 PM   Specimen: Urine, Clean Catch  Result Value Ref Range Status   Specimen Description   Final    URINE, CLEAN CATCH Performed at [facility], [address] [place], [place] [address]    Special Requests   Final    NONE Performed at [facility], [address] [place], [place] [address]    Culture (A)  Final    >=100,000 COLONIES/mL MULTIPLE SPECIES PRESENT, SUGGEST RECOLLECTION   Report Status 06/16/2020 FINAL  Final  SARS Coronavirus 2 by RT PCR (hospital order, performed in [facility] hospital lab) Nasopharyngeal Urine, Clean Catch     Status: None   Collection Time: 06/15/20 12:38 PM   Specimen: Urine, Clean Catch; Nasopharyngeal  Result Value Ref Range Status   SARS Coronavirus 2 NEGATIVE NEGATIVE Final    Comment: (NOTE) SARS-CoV-2 target nucleic acids are NOT DETECTED.  The SARS-CoV-2 RNA is generally detectable in upper and lower respiratory specimens during the acute phase of infection. The lowest concentration of SARS-CoV-2 viral copies this assay can detect is 250 copies / mL. A negative result does not preclude SARS-CoV-2 infection and should not be used as the sole basis for treatment or other patient management decisions.  A negative result may occur with improper specimen collection / handling, submission of specimen other than nasopharyngeal swab, presence of viral mutation(s) within the areas targeted by this assay, and inadequate number of viral copies (<250 copies / mL). A negative result must be combined with clinical observations, patient history, and epidemiological information.  Fact Sheet for Patients:   StrictlyIdeas.no  Fact Sheet for Healthcare  Providers: BankingDealers.co.za  This test is not yet approved or  cleared by the [place] FDA and has been authorized for detection and/or diagnosis of SARS-CoV-2 by FDA under an Emergency Use Authorization (EUA).  This EUA will remain in effect (meaning this test can be used) for the duration of the COVID-19 declaration under Section 564(b)(1) of the Act, 21 U.S.C. section 360bbb-3(b)(1), unless the authorization is terminated or revoked sooner.  Performed at [facility], [address] [place], [place] [address]   Blood culture (routine x 2)     Status: None   Collection Time: 06/15/20  1:55 PM   Specimen: BLOOD  Result Value Ref Range Status   Specimen Description   Final    BLOOD RIGHT ANTECUBITAL Performed at [facility], [place] [address], [place], [place] [address]    Special Requests   Final    BOTTLES DRAWN AEROBIC AND ANAEROBIC Blood Culture adequate volume Performed at [facility], [address] [place], [place] [address]    Culture   Final    NO GROWTH 5 DAYS Performed at [facility], [place] [address] [place], [place] [address]    Report Status 06/20/2020 FINAL  Final  Blood culture (routine x 2)     Status: None   Collection Time: 06/15/20  1:55 PM   Specimen: BLOOD LEFT FOREARM  Result Value Ref Range Status   Specimen Description   Final    BLOOD LEFT FOREARM Performed at [facility], [address] [place], [place] [address]    Special Requests   Final    BOTTLES DRAWN AEROBIC AND ANAEROBIC Blood Culture adequate volume Performed at  [place], [address] [place], [place] [address]    Culture   Final    NO GROWTH 5 DAYS Performed at [facility], [place] [address] [place], [place] [address]    Report Status 06/20/2020 FINAL  Final     Labs: Basic Metabolic Panel: Recent Labs  Lab 06/15/20 1246 06/16/20 0421 06/17/20 1000 06/18/20 1158  06/19/20 0532 06/20/20 0622 06/21/20 0424  NA  --    < > 143 143 140 139 141  K  --    < > 3.6 3.5 3.5 4.1 4.1  CL  --    < > 108 104 102 105 103  CO2  --    < > 22 28 26 24 24   GLUCOSE  --    < > 133* 151* 143* 139* 122*  BUN  --    < > 28* 18 16 21 22   CREATININE  --    < > 1.16* 0.87 0.94 1.09* 0.99  CALCIUM  --    < > 9.2 9.0 8.9 9.1 9.0  MG 1.9  --   --   --   --  1.9  --   PHOS  --   --   --   --   --  4.7*  --    < > = values in this interval not displayed.   Liver Function Tests: Recent Labs  Lab 06/15/20 1045  AST 32  ALT 26  ALKPHOS 34*  BILITOT 0.9  PROT 7.7  ALBUMIN 4.3   Recent Labs  Lab 06/15/20 1045  LIPASE 29   Recent Labs  Lab 06/16/20 0421  AMMONIA 15   CBC: Recent Labs  Lab 06/15/20 1045 06/16/20 0421 06/17/20 1000 06/18/20 1158 06/19/20 0532  WBC 13.1* 12.5* 11.9* 9.1 8.9  NEUTROABS 11.6*  --   --   --   --   HGB 15.6* 13.3 14.3 14.1 14.2  HCT 47.3* 41.5 45.6 44.9 44.0  MCV 94.2 97.4 97.2 95.7 95.7  PLT 210 129* 171 153 168   Cardiac Enzymes: No results for input(s): CKTOTAL, CKMB, CKMBINDEX, TROPONINI in the last 168 hours. BNP: BNP (last 3 results) No results for input(s): BNP in the last 8760 hours.  ProBNP (last 3 results) No results for input(s): PROBNP in the last 8760 hours.  CBG: Recent Labs  Lab 06/20/20 1126 06/20/20 1609 06/20/20 2115 06/21/20 0632 06/21/20 1109  GLUCAP 111* 140* 94 114* 107*       Signed:  Kayleen Memos, MD [facility] 06/21/2020, 11:17 AM

## 2020-06-21 NOTE — Progress Notes (Signed)
DISCHARGE NOTE HOME Sonya Goodwin to be discharged Home per MD order. Discussed prescriptions and follow up appointments with the patient. Prescriptions given to patient; medication list explained in detail. Patient verbalized understanding.  Skin clean, dry and intact without evidence of skin break down, no evidence of skin tears noted. IV catheter discontinued intact. Site without signs and symptoms of complications. Dressing and pressure applied. Pt denies pain at the site currently. No complaints noted.  Patient free of lines, drains, and wounds.   An After Visit Summary (AVS) was printed and given to the patient. Patient escorted via wheelchair, and discharged home via private auto.  Arlyss Repress, RN

## 2020-06-23 DIAGNOSIS — M1710 Unilateral primary osteoarthritis, unspecified knee: Secondary | ICD-10-CM | POA: Diagnosis not present

## 2020-06-23 DIAGNOSIS — E2740 Unspecified adrenocortical insufficiency: Secondary | ICD-10-CM | POA: Diagnosis not present

## 2020-06-23 DIAGNOSIS — G2581 Restless legs syndrome: Secondary | ICD-10-CM | POA: Diagnosis not present

## 2020-06-23 DIAGNOSIS — Z4800 Encounter for change or removal of nonsurgical wound dressing: Secondary | ICD-10-CM | POA: Diagnosis not present

## 2020-06-23 DIAGNOSIS — E785 Hyperlipidemia, unspecified: Secondary | ICD-10-CM | POA: Diagnosis not present

## 2020-06-23 DIAGNOSIS — E1151 Type 2 diabetes mellitus with diabetic peripheral angiopathy without gangrene: Secondary | ICD-10-CM | POA: Diagnosis not present

## 2020-06-23 DIAGNOSIS — N1831 Chronic kidney disease, stage 3a: Secondary | ICD-10-CM | POA: Diagnosis not present

## 2020-06-23 DIAGNOSIS — L97822 Non-pressure chronic ulcer of other part of left lower leg with fat layer exposed: Secondary | ICD-10-CM | POA: Diagnosis not present

## 2020-06-23 DIAGNOSIS — E1142 Type 2 diabetes mellitus with diabetic polyneuropathy: Secondary | ICD-10-CM | POA: Diagnosis not present

## 2020-06-23 DIAGNOSIS — L97812 Non-pressure chronic ulcer of other part of right lower leg with fat layer exposed: Secondary | ICD-10-CM | POA: Diagnosis not present

## 2020-06-23 DIAGNOSIS — L97221 Non-pressure chronic ulcer of left calf limited to breakdown of skin: Secondary | ICD-10-CM | POA: Diagnosis not present

## 2020-06-23 DIAGNOSIS — L89891 Pressure ulcer of other site, stage 1: Secondary | ICD-10-CM | POA: Diagnosis not present

## 2020-06-23 DIAGNOSIS — I872 Venous insufficiency (chronic) (peripheral): Secondary | ICD-10-CM | POA: Diagnosis not present

## 2020-06-23 DIAGNOSIS — E1143 Type 2 diabetes mellitus with diabetic autonomic (poly)neuropathy: Secondary | ICD-10-CM | POA: Diagnosis not present

## 2020-06-23 DIAGNOSIS — S51812D Laceration without foreign body of left forearm, subsequent encounter: Secondary | ICD-10-CM | POA: Diagnosis not present

## 2020-06-23 DIAGNOSIS — D631 Anemia in chronic kidney disease: Secondary | ICD-10-CM | POA: Diagnosis not present

## 2020-06-23 DIAGNOSIS — D869 Sarcoidosis, unspecified: Secondary | ICD-10-CM | POA: Diagnosis not present

## 2020-06-23 DIAGNOSIS — E1122 Type 2 diabetes mellitus with diabetic chronic kidney disease: Secondary | ICD-10-CM | POA: Diagnosis not present

## 2020-06-23 DIAGNOSIS — I129 Hypertensive chronic kidney disease with stage 1 through stage 4 chronic kidney disease, or unspecified chronic kidney disease: Secondary | ICD-10-CM | POA: Diagnosis not present

## 2020-06-23 DIAGNOSIS — L97821 Non-pressure chronic ulcer of other part of left lower leg limited to breakdown of skin: Secondary | ICD-10-CM | POA: Diagnosis not present

## 2020-06-25 DIAGNOSIS — E1142 Type 2 diabetes mellitus with diabetic polyneuropathy: Secondary | ICD-10-CM | POA: Diagnosis not present

## 2020-06-25 DIAGNOSIS — I129 Hypertensive chronic kidney disease with stage 1 through stage 4 chronic kidney disease, or unspecified chronic kidney disease: Secondary | ICD-10-CM | POA: Diagnosis not present

## 2020-06-25 DIAGNOSIS — N1831 Chronic kidney disease, stage 3a: Secondary | ICD-10-CM | POA: Diagnosis not present

## 2020-06-25 DIAGNOSIS — E785 Hyperlipidemia, unspecified: Secondary | ICD-10-CM | POA: Diagnosis not present

## 2020-06-25 DIAGNOSIS — E1122 Type 2 diabetes mellitus with diabetic chronic kidney disease: Secondary | ICD-10-CM | POA: Diagnosis not present

## 2020-06-25 DIAGNOSIS — D869 Sarcoidosis, unspecified: Secondary | ICD-10-CM | POA: Diagnosis not present

## 2020-06-25 DIAGNOSIS — M1710 Unilateral primary osteoarthritis, unspecified knee: Secondary | ICD-10-CM | POA: Diagnosis not present

## 2020-06-25 DIAGNOSIS — E2740 Unspecified adrenocortical insufficiency: Secondary | ICD-10-CM | POA: Diagnosis not present

## 2020-06-25 DIAGNOSIS — E1143 Type 2 diabetes mellitus with diabetic autonomic (poly)neuropathy: Secondary | ICD-10-CM | POA: Diagnosis not present

## 2020-06-25 DIAGNOSIS — G2581 Restless legs syndrome: Secondary | ICD-10-CM | POA: Diagnosis not present

## 2020-06-26 DIAGNOSIS — E785 Hyperlipidemia, unspecified: Secondary | ICD-10-CM | POA: Diagnosis not present

## 2020-06-26 DIAGNOSIS — G2581 Restless legs syndrome: Secondary | ICD-10-CM | POA: Diagnosis not present

## 2020-06-26 DIAGNOSIS — N1831 Chronic kidney disease, stage 3a: Secondary | ICD-10-CM | POA: Diagnosis not present

## 2020-06-26 DIAGNOSIS — M1710 Unilateral primary osteoarthritis, unspecified knee: Secondary | ICD-10-CM | POA: Diagnosis not present

## 2020-06-26 DIAGNOSIS — I129 Hypertensive chronic kidney disease with stage 1 through stage 4 chronic kidney disease, or unspecified chronic kidney disease: Secondary | ICD-10-CM | POA: Diagnosis not present

## 2020-06-26 DIAGNOSIS — E1142 Type 2 diabetes mellitus with diabetic polyneuropathy: Secondary | ICD-10-CM | POA: Diagnosis not present

## 2020-06-26 DIAGNOSIS — E2740 Unspecified adrenocortical insufficiency: Secondary | ICD-10-CM | POA: Diagnosis not present

## 2020-06-26 DIAGNOSIS — D869 Sarcoidosis, unspecified: Secondary | ICD-10-CM | POA: Diagnosis not present

## 2020-06-26 DIAGNOSIS — E1122 Type 2 diabetes mellitus with diabetic chronic kidney disease: Secondary | ICD-10-CM | POA: Diagnosis not present

## 2020-06-26 DIAGNOSIS — E1143 Type 2 diabetes mellitus with diabetic autonomic (poly)neuropathy: Secondary | ICD-10-CM | POA: Diagnosis not present

## 2020-06-27 DIAGNOSIS — E1122 Type 2 diabetes mellitus with diabetic chronic kidney disease: Secondary | ICD-10-CM | POA: Diagnosis not present

## 2020-06-27 DIAGNOSIS — R531 Weakness: Secondary | ICD-10-CM

## 2020-06-27 DIAGNOSIS — I129 Hypertensive chronic kidney disease with stage 1 through stage 4 chronic kidney disease, or unspecified chronic kidney disease: Secondary | ICD-10-CM | POA: Diagnosis not present

## 2020-06-27 DIAGNOSIS — E1143 Type 2 diabetes mellitus with diabetic autonomic (poly)neuropathy: Secondary | ICD-10-CM | POA: Diagnosis not present

## 2020-06-27 DIAGNOSIS — E2740 Unspecified adrenocortical insufficiency: Secondary | ICD-10-CM | POA: Diagnosis not present

## 2020-06-27 DIAGNOSIS — G2581 Restless legs syndrome: Secondary | ICD-10-CM | POA: Diagnosis not present

## 2020-06-27 DIAGNOSIS — E1142 Type 2 diabetes mellitus with diabetic polyneuropathy: Secondary | ICD-10-CM | POA: Diagnosis not present

## 2020-06-27 DIAGNOSIS — N1831 Chronic kidney disease, stage 3a: Secondary | ICD-10-CM | POA: Diagnosis not present

## 2020-06-27 DIAGNOSIS — M1710 Unilateral primary osteoarthritis, unspecified knee: Secondary | ICD-10-CM | POA: Diagnosis not present

## 2020-06-27 DIAGNOSIS — E785 Hyperlipidemia, unspecified: Secondary | ICD-10-CM | POA: Diagnosis not present

## 2020-06-27 DIAGNOSIS — D869 Sarcoidosis, unspecified: Secondary | ICD-10-CM | POA: Diagnosis not present

## 2020-06-28 DIAGNOSIS — I10 Essential (primary) hypertension: Secondary | ICD-10-CM | POA: Diagnosis not present

## 2020-06-28 DIAGNOSIS — N1831 Chronic kidney disease, stage 3a: Secondary | ICD-10-CM | POA: Diagnosis not present

## 2020-06-28 DIAGNOSIS — E1143 Type 2 diabetes mellitus with diabetic autonomic (poly)neuropathy: Secondary | ICD-10-CM | POA: Diagnosis not present

## 2020-06-28 DIAGNOSIS — E538 Deficiency of other specified B group vitamins: Secondary | ICD-10-CM | POA: Diagnosis not present

## 2020-06-28 DIAGNOSIS — G2581 Restless legs syndrome: Secondary | ICD-10-CM | POA: Diagnosis not present

## 2020-06-28 DIAGNOSIS — E785 Hyperlipidemia, unspecified: Secondary | ICD-10-CM | POA: Diagnosis not present

## 2020-06-28 DIAGNOSIS — M1710 Unilateral primary osteoarthritis, unspecified knee: Secondary | ICD-10-CM | POA: Diagnosis not present

## 2020-06-28 DIAGNOSIS — N3000 Acute cystitis without hematuria: Secondary | ICD-10-CM | POA: Diagnosis not present

## 2020-06-28 DIAGNOSIS — E1142 Type 2 diabetes mellitus with diabetic polyneuropathy: Secondary | ICD-10-CM | POA: Diagnosis not present

## 2020-06-28 DIAGNOSIS — E876 Hypokalemia: Secondary | ICD-10-CM | POA: Diagnosis not present

## 2020-06-28 DIAGNOSIS — D869 Sarcoidosis, unspecified: Secondary | ICD-10-CM | POA: Diagnosis not present

## 2020-06-28 DIAGNOSIS — E2740 Unspecified adrenocortical insufficiency: Secondary | ICD-10-CM | POA: Diagnosis not present

## 2020-06-28 DIAGNOSIS — E1122 Type 2 diabetes mellitus with diabetic chronic kidney disease: Secondary | ICD-10-CM | POA: Diagnosis not present

## 2020-06-28 DIAGNOSIS — I129 Hypertensive chronic kidney disease with stage 1 through stage 4 chronic kidney disease, or unspecified chronic kidney disease: Secondary | ICD-10-CM | POA: Diagnosis not present

## 2020-06-30 DIAGNOSIS — E785 Hyperlipidemia, unspecified: Secondary | ICD-10-CM | POA: Diagnosis not present

## 2020-06-30 DIAGNOSIS — M1710 Unilateral primary osteoarthritis, unspecified knee: Secondary | ICD-10-CM | POA: Diagnosis not present

## 2020-06-30 DIAGNOSIS — E1122 Type 2 diabetes mellitus with diabetic chronic kidney disease: Secondary | ICD-10-CM | POA: Diagnosis not present

## 2020-06-30 DIAGNOSIS — E1143 Type 2 diabetes mellitus with diabetic autonomic (poly)neuropathy: Secondary | ICD-10-CM | POA: Diagnosis not present

## 2020-06-30 DIAGNOSIS — E1142 Type 2 diabetes mellitus with diabetic polyneuropathy: Secondary | ICD-10-CM | POA: Diagnosis not present

## 2020-06-30 DIAGNOSIS — G2581 Restless legs syndrome: Secondary | ICD-10-CM | POA: Diagnosis not present

## 2020-06-30 DIAGNOSIS — D869 Sarcoidosis, unspecified: Secondary | ICD-10-CM | POA: Diagnosis not present

## 2020-06-30 DIAGNOSIS — E2740 Unspecified adrenocortical insufficiency: Secondary | ICD-10-CM | POA: Diagnosis not present

## 2020-06-30 DIAGNOSIS — N1831 Chronic kidney disease, stage 3a: Secondary | ICD-10-CM | POA: Diagnosis not present

## 2020-06-30 DIAGNOSIS — I129 Hypertensive chronic kidney disease with stage 1 through stage 4 chronic kidney disease, or unspecified chronic kidney disease: Secondary | ICD-10-CM | POA: Diagnosis not present

## 2020-07-03 DIAGNOSIS — M1710 Unilateral primary osteoarthritis, unspecified knee: Secondary | ICD-10-CM | POA: Diagnosis not present

## 2020-07-03 DIAGNOSIS — I129 Hypertensive chronic kidney disease with stage 1 through stage 4 chronic kidney disease, or unspecified chronic kidney disease: Secondary | ICD-10-CM | POA: Diagnosis not present

## 2020-07-03 DIAGNOSIS — E1143 Type 2 diabetes mellitus with diabetic autonomic (poly)neuropathy: Secondary | ICD-10-CM | POA: Diagnosis not present

## 2020-07-03 DIAGNOSIS — E1142 Type 2 diabetes mellitus with diabetic polyneuropathy: Secondary | ICD-10-CM | POA: Diagnosis not present

## 2020-07-03 DIAGNOSIS — E2740 Unspecified adrenocortical insufficiency: Secondary | ICD-10-CM | POA: Diagnosis not present

## 2020-07-03 DIAGNOSIS — N1831 Chronic kidney disease, stage 3a: Secondary | ICD-10-CM | POA: Diagnosis not present

## 2020-07-03 DIAGNOSIS — D869 Sarcoidosis, unspecified: Secondary | ICD-10-CM | POA: Diagnosis not present

## 2020-07-03 DIAGNOSIS — E785 Hyperlipidemia, unspecified: Secondary | ICD-10-CM | POA: Diagnosis not present

## 2020-07-03 DIAGNOSIS — G2581 Restless legs syndrome: Secondary | ICD-10-CM | POA: Diagnosis not present

## 2020-07-03 DIAGNOSIS — E1122 Type 2 diabetes mellitus with diabetic chronic kidney disease: Secondary | ICD-10-CM | POA: Diagnosis not present

## 2020-07-04 DIAGNOSIS — E2740 Unspecified adrenocortical insufficiency: Secondary | ICD-10-CM | POA: Diagnosis not present

## 2020-07-04 DIAGNOSIS — G2581 Restless legs syndrome: Secondary | ICD-10-CM | POA: Diagnosis not present

## 2020-07-04 DIAGNOSIS — D869 Sarcoidosis, unspecified: Secondary | ICD-10-CM | POA: Diagnosis not present

## 2020-07-04 DIAGNOSIS — N1831 Chronic kidney disease, stage 3a: Secondary | ICD-10-CM | POA: Diagnosis not present

## 2020-07-04 DIAGNOSIS — I129 Hypertensive chronic kidney disease with stage 1 through stage 4 chronic kidney disease, or unspecified chronic kidney disease: Secondary | ICD-10-CM | POA: Diagnosis not present

## 2020-07-04 DIAGNOSIS — E1142 Type 2 diabetes mellitus with diabetic polyneuropathy: Secondary | ICD-10-CM | POA: Diagnosis not present

## 2020-07-04 DIAGNOSIS — E785 Hyperlipidemia, unspecified: Secondary | ICD-10-CM | POA: Diagnosis not present

## 2020-07-04 DIAGNOSIS — M1710 Unilateral primary osteoarthritis, unspecified knee: Secondary | ICD-10-CM | POA: Diagnosis not present

## 2020-07-04 DIAGNOSIS — E1122 Type 2 diabetes mellitus with diabetic chronic kidney disease: Secondary | ICD-10-CM | POA: Diagnosis not present

## 2020-07-04 DIAGNOSIS — E1143 Type 2 diabetes mellitus with diabetic autonomic (poly)neuropathy: Secondary | ICD-10-CM | POA: Diagnosis not present

## 2020-07-05 DIAGNOSIS — E1122 Type 2 diabetes mellitus with diabetic chronic kidney disease: Secondary | ICD-10-CM | POA: Diagnosis not present

## 2020-07-05 DIAGNOSIS — N1831 Chronic kidney disease, stage 3a: Secondary | ICD-10-CM | POA: Diagnosis not present

## 2020-07-05 DIAGNOSIS — I129 Hypertensive chronic kidney disease with stage 1 through stage 4 chronic kidney disease, or unspecified chronic kidney disease: Secondary | ICD-10-CM | POA: Diagnosis not present

## 2020-07-05 DIAGNOSIS — G2581 Restless legs syndrome: Secondary | ICD-10-CM | POA: Diagnosis not present

## 2020-07-05 DIAGNOSIS — E785 Hyperlipidemia, unspecified: Secondary | ICD-10-CM | POA: Diagnosis not present

## 2020-07-05 DIAGNOSIS — M1710 Unilateral primary osteoarthritis, unspecified knee: Secondary | ICD-10-CM | POA: Diagnosis not present

## 2020-07-05 DIAGNOSIS — E1143 Type 2 diabetes mellitus with diabetic autonomic (poly)neuropathy: Secondary | ICD-10-CM | POA: Diagnosis not present

## 2020-07-05 DIAGNOSIS — D869 Sarcoidosis, unspecified: Secondary | ICD-10-CM | POA: Diagnosis not present

## 2020-07-05 DIAGNOSIS — E2740 Unspecified adrenocortical insufficiency: Secondary | ICD-10-CM | POA: Diagnosis not present

## 2020-07-05 DIAGNOSIS — E1142 Type 2 diabetes mellitus with diabetic polyneuropathy: Secondary | ICD-10-CM | POA: Diagnosis not present

## 2020-07-06 DIAGNOSIS — G2581 Restless legs syndrome: Secondary | ICD-10-CM | POA: Diagnosis not present

## 2020-07-06 DIAGNOSIS — N1831 Chronic kidney disease, stage 3a: Secondary | ICD-10-CM | POA: Diagnosis not present

## 2020-07-06 DIAGNOSIS — E2740 Unspecified adrenocortical insufficiency: Secondary | ICD-10-CM | POA: Diagnosis not present

## 2020-07-06 DIAGNOSIS — I129 Hypertensive chronic kidney disease with stage 1 through stage 4 chronic kidney disease, or unspecified chronic kidney disease: Secondary | ICD-10-CM | POA: Diagnosis not present

## 2020-07-06 DIAGNOSIS — E1142 Type 2 diabetes mellitus with diabetic polyneuropathy: Secondary | ICD-10-CM | POA: Diagnosis not present

## 2020-07-06 DIAGNOSIS — M1710 Unilateral primary osteoarthritis, unspecified knee: Secondary | ICD-10-CM | POA: Diagnosis not present

## 2020-07-06 DIAGNOSIS — E1122 Type 2 diabetes mellitus with diabetic chronic kidney disease: Secondary | ICD-10-CM | POA: Diagnosis not present

## 2020-07-06 DIAGNOSIS — D869 Sarcoidosis, unspecified: Secondary | ICD-10-CM | POA: Diagnosis not present

## 2020-07-06 DIAGNOSIS — E785 Hyperlipidemia, unspecified: Secondary | ICD-10-CM | POA: Diagnosis not present

## 2020-07-06 DIAGNOSIS — E1143 Type 2 diabetes mellitus with diabetic autonomic (poly)neuropathy: Secondary | ICD-10-CM | POA: Diagnosis not present

## 2020-07-10 DIAGNOSIS — E2740 Unspecified adrenocortical insufficiency: Secondary | ICD-10-CM | POA: Diagnosis not present

## 2020-07-10 DIAGNOSIS — I129 Hypertensive chronic kidney disease with stage 1 through stage 4 chronic kidney disease, or unspecified chronic kidney disease: Secondary | ICD-10-CM | POA: Diagnosis not present

## 2020-07-10 DIAGNOSIS — M1710 Unilateral primary osteoarthritis, unspecified knee: Secondary | ICD-10-CM | POA: Diagnosis not present

## 2020-07-10 DIAGNOSIS — E1142 Type 2 diabetes mellitus with diabetic polyneuropathy: Secondary | ICD-10-CM | POA: Diagnosis not present

## 2020-07-10 DIAGNOSIS — E785 Hyperlipidemia, unspecified: Secondary | ICD-10-CM | POA: Diagnosis not present

## 2020-07-10 DIAGNOSIS — E1122 Type 2 diabetes mellitus with diabetic chronic kidney disease: Secondary | ICD-10-CM | POA: Diagnosis not present

## 2020-07-10 DIAGNOSIS — E1143 Type 2 diabetes mellitus with diabetic autonomic (poly)neuropathy: Secondary | ICD-10-CM | POA: Diagnosis not present

## 2020-07-10 DIAGNOSIS — N1831 Chronic kidney disease, stage 3a: Secondary | ICD-10-CM | POA: Diagnosis not present

## 2020-07-10 DIAGNOSIS — G2581 Restless legs syndrome: Secondary | ICD-10-CM | POA: Diagnosis not present

## 2020-07-10 DIAGNOSIS — D869 Sarcoidosis, unspecified: Secondary | ICD-10-CM | POA: Diagnosis not present

## 2020-07-12 DIAGNOSIS — I129 Hypertensive chronic kidney disease with stage 1 through stage 4 chronic kidney disease, or unspecified chronic kidney disease: Secondary | ICD-10-CM | POA: Diagnosis not present

## 2020-07-12 DIAGNOSIS — E1143 Type 2 diabetes mellitus with diabetic autonomic (poly)neuropathy: Secondary | ICD-10-CM | POA: Diagnosis not present

## 2020-07-12 DIAGNOSIS — N189 Chronic kidney disease, unspecified: Secondary | ICD-10-CM | POA: Diagnosis not present

## 2020-07-12 DIAGNOSIS — M1710 Unilateral primary osteoarthritis, unspecified knee: Secondary | ICD-10-CM | POA: Diagnosis not present

## 2020-07-12 DIAGNOSIS — M8580 Other specified disorders of bone density and structure, unspecified site: Secondary | ICD-10-CM | POA: Diagnosis not present

## 2020-07-12 DIAGNOSIS — I10 Essential (primary) hypertension: Secondary | ICD-10-CM | POA: Diagnosis not present

## 2020-07-12 DIAGNOSIS — E1122 Type 2 diabetes mellitus with diabetic chronic kidney disease: Secondary | ICD-10-CM | POA: Diagnosis not present

## 2020-07-12 DIAGNOSIS — E782 Mixed hyperlipidemia: Secondary | ICD-10-CM | POA: Diagnosis not present

## 2020-07-12 DIAGNOSIS — H409 Unspecified glaucoma: Secondary | ICD-10-CM | POA: Diagnosis not present

## 2020-07-12 DIAGNOSIS — E1142 Type 2 diabetes mellitus with diabetic polyneuropathy: Secondary | ICD-10-CM | POA: Diagnosis not present

## 2020-07-12 DIAGNOSIS — N1831 Chronic kidney disease, stage 3a: Secondary | ICD-10-CM | POA: Diagnosis not present

## 2020-07-12 DIAGNOSIS — N183 Chronic kidney disease, stage 3 unspecified: Secondary | ICD-10-CM | POA: Diagnosis not present

## 2020-07-12 DIAGNOSIS — G2581 Restless legs syndrome: Secondary | ICD-10-CM | POA: Diagnosis not present

## 2020-07-12 DIAGNOSIS — M810 Age-related osteoporosis without current pathological fracture: Secondary | ICD-10-CM | POA: Diagnosis not present

## 2020-07-12 DIAGNOSIS — M1990 Unspecified osteoarthritis, unspecified site: Secondary | ICD-10-CM | POA: Diagnosis not present

## 2020-07-12 DIAGNOSIS — D869 Sarcoidosis, unspecified: Secondary | ICD-10-CM | POA: Diagnosis not present

## 2020-07-12 DIAGNOSIS — E2740 Unspecified adrenocortical insufficiency: Secondary | ICD-10-CM | POA: Diagnosis not present

## 2020-07-12 DIAGNOSIS — E785 Hyperlipidemia, unspecified: Secondary | ICD-10-CM | POA: Diagnosis not present

## 2020-07-14 DIAGNOSIS — M1710 Unilateral primary osteoarthritis, unspecified knee: Secondary | ICD-10-CM | POA: Diagnosis not present

## 2020-07-14 DIAGNOSIS — E2740 Unspecified adrenocortical insufficiency: Secondary | ICD-10-CM | POA: Diagnosis not present

## 2020-07-14 DIAGNOSIS — E1142 Type 2 diabetes mellitus with diabetic polyneuropathy: Secondary | ICD-10-CM | POA: Diagnosis not present

## 2020-07-14 DIAGNOSIS — E785 Hyperlipidemia, unspecified: Secondary | ICD-10-CM | POA: Diagnosis not present

## 2020-07-14 DIAGNOSIS — E1122 Type 2 diabetes mellitus with diabetic chronic kidney disease: Secondary | ICD-10-CM | POA: Diagnosis not present

## 2020-07-14 DIAGNOSIS — D869 Sarcoidosis, unspecified: Secondary | ICD-10-CM | POA: Diagnosis not present

## 2020-07-14 DIAGNOSIS — N1831 Chronic kidney disease, stage 3a: Secondary | ICD-10-CM | POA: Diagnosis not present

## 2020-07-14 DIAGNOSIS — G2581 Restless legs syndrome: Secondary | ICD-10-CM | POA: Diagnosis not present

## 2020-07-14 DIAGNOSIS — I129 Hypertensive chronic kidney disease with stage 1 through stage 4 chronic kidney disease, or unspecified chronic kidney disease: Secondary | ICD-10-CM | POA: Diagnosis not present

## 2020-07-14 DIAGNOSIS — E1143 Type 2 diabetes mellitus with diabetic autonomic (poly)neuropathy: Secondary | ICD-10-CM | POA: Diagnosis not present

## 2020-07-18 DIAGNOSIS — M1710 Unilateral primary osteoarthritis, unspecified knee: Secondary | ICD-10-CM | POA: Diagnosis not present

## 2020-07-18 DIAGNOSIS — E785 Hyperlipidemia, unspecified: Secondary | ICD-10-CM | POA: Diagnosis not present

## 2020-07-18 DIAGNOSIS — G2581 Restless legs syndrome: Secondary | ICD-10-CM | POA: Diagnosis not present

## 2020-07-18 DIAGNOSIS — D869 Sarcoidosis, unspecified: Secondary | ICD-10-CM | POA: Diagnosis not present

## 2020-07-18 DIAGNOSIS — N1831 Chronic kidney disease, stage 3a: Secondary | ICD-10-CM | POA: Diagnosis not present

## 2020-07-18 DIAGNOSIS — E1122 Type 2 diabetes mellitus with diabetic chronic kidney disease: Secondary | ICD-10-CM | POA: Diagnosis not present

## 2020-07-18 DIAGNOSIS — E1143 Type 2 diabetes mellitus with diabetic autonomic (poly)neuropathy: Secondary | ICD-10-CM | POA: Diagnosis not present

## 2020-07-18 DIAGNOSIS — E1142 Type 2 diabetes mellitus with diabetic polyneuropathy: Secondary | ICD-10-CM | POA: Diagnosis not present

## 2020-07-18 DIAGNOSIS — I129 Hypertensive chronic kidney disease with stage 1 through stage 4 chronic kidney disease, or unspecified chronic kidney disease: Secondary | ICD-10-CM | POA: Diagnosis not present

## 2020-07-18 DIAGNOSIS — E2740 Unspecified adrenocortical insufficiency: Secondary | ICD-10-CM | POA: Diagnosis not present

## 2020-07-19 DIAGNOSIS — E2740 Unspecified adrenocortical insufficiency: Secondary | ICD-10-CM | POA: Diagnosis not present

## 2020-07-19 DIAGNOSIS — E1143 Type 2 diabetes mellitus with diabetic autonomic (poly)neuropathy: Secondary | ICD-10-CM | POA: Diagnosis not present

## 2020-07-19 DIAGNOSIS — E1122 Type 2 diabetes mellitus with diabetic chronic kidney disease: Secondary | ICD-10-CM | POA: Diagnosis not present

## 2020-07-19 DIAGNOSIS — D869 Sarcoidosis, unspecified: Secondary | ICD-10-CM | POA: Diagnosis not present

## 2020-07-19 DIAGNOSIS — E785 Hyperlipidemia, unspecified: Secondary | ICD-10-CM | POA: Diagnosis not present

## 2020-07-19 DIAGNOSIS — M1710 Unilateral primary osteoarthritis, unspecified knee: Secondary | ICD-10-CM | POA: Diagnosis not present

## 2020-07-19 DIAGNOSIS — G2581 Restless legs syndrome: Secondary | ICD-10-CM | POA: Diagnosis not present

## 2020-07-19 DIAGNOSIS — E1142 Type 2 diabetes mellitus with diabetic polyneuropathy: Secondary | ICD-10-CM | POA: Diagnosis not present

## 2020-07-19 DIAGNOSIS — I129 Hypertensive chronic kidney disease with stage 1 through stage 4 chronic kidney disease, or unspecified chronic kidney disease: Secondary | ICD-10-CM | POA: Diagnosis not present

## 2020-07-19 DIAGNOSIS — N1831 Chronic kidney disease, stage 3a: Secondary | ICD-10-CM | POA: Diagnosis not present

## 2020-07-24 DIAGNOSIS — M1710 Unilateral primary osteoarthritis, unspecified knee: Secondary | ICD-10-CM | POA: Diagnosis not present

## 2020-07-24 DIAGNOSIS — E1142 Type 2 diabetes mellitus with diabetic polyneuropathy: Secondary | ICD-10-CM | POA: Diagnosis not present

## 2020-07-24 DIAGNOSIS — D869 Sarcoidosis, unspecified: Secondary | ICD-10-CM | POA: Diagnosis not present

## 2020-07-24 DIAGNOSIS — R69 Illness, unspecified: Secondary | ICD-10-CM | POA: Diagnosis not present

## 2020-07-24 DIAGNOSIS — E785 Hyperlipidemia, unspecified: Secondary | ICD-10-CM | POA: Diagnosis not present

## 2020-07-24 DIAGNOSIS — I129 Hypertensive chronic kidney disease with stage 1 through stage 4 chronic kidney disease, or unspecified chronic kidney disease: Secondary | ICD-10-CM | POA: Diagnosis not present

## 2020-07-24 DIAGNOSIS — G2581 Restless legs syndrome: Secondary | ICD-10-CM | POA: Diagnosis not present

## 2020-07-24 DIAGNOSIS — E2740 Unspecified adrenocortical insufficiency: Secondary | ICD-10-CM | POA: Diagnosis not present

## 2020-07-24 DIAGNOSIS — E1143 Type 2 diabetes mellitus with diabetic autonomic (poly)neuropathy: Secondary | ICD-10-CM | POA: Diagnosis not present

## 2020-07-24 DIAGNOSIS — E1122 Type 2 diabetes mellitus with diabetic chronic kidney disease: Secondary | ICD-10-CM | POA: Diagnosis not present

## 2020-07-24 DIAGNOSIS — N1831 Chronic kidney disease, stage 3a: Secondary | ICD-10-CM | POA: Diagnosis not present

## 2020-07-27 DIAGNOSIS — E785 Hyperlipidemia, unspecified: Secondary | ICD-10-CM | POA: Diagnosis not present

## 2020-07-27 DIAGNOSIS — N1831 Chronic kidney disease, stage 3a: Secondary | ICD-10-CM | POA: Diagnosis not present

## 2020-07-27 DIAGNOSIS — E1143 Type 2 diabetes mellitus with diabetic autonomic (poly)neuropathy: Secondary | ICD-10-CM | POA: Diagnosis not present

## 2020-07-27 DIAGNOSIS — G2581 Restless legs syndrome: Secondary | ICD-10-CM | POA: Diagnosis not present

## 2020-07-27 DIAGNOSIS — D869 Sarcoidosis, unspecified: Secondary | ICD-10-CM | POA: Diagnosis not present

## 2020-07-27 DIAGNOSIS — L03119 Cellulitis of unspecified part of limb: Secondary | ICD-10-CM | POA: Diagnosis not present

## 2020-07-27 DIAGNOSIS — L03116 Cellulitis of left lower limb: Secondary | ICD-10-CM | POA: Diagnosis not present

## 2020-07-27 DIAGNOSIS — E1142 Type 2 diabetes mellitus with diabetic polyneuropathy: Secondary | ICD-10-CM | POA: Diagnosis not present

## 2020-07-27 DIAGNOSIS — E1122 Type 2 diabetes mellitus with diabetic chronic kidney disease: Secondary | ICD-10-CM | POA: Diagnosis not present

## 2020-07-27 DIAGNOSIS — I129 Hypertensive chronic kidney disease with stage 1 through stage 4 chronic kidney disease, or unspecified chronic kidney disease: Secondary | ICD-10-CM | POA: Diagnosis not present

## 2020-07-27 DIAGNOSIS — E2740 Unspecified adrenocortical insufficiency: Secondary | ICD-10-CM | POA: Diagnosis not present

## 2020-07-27 DIAGNOSIS — M1710 Unilateral primary osteoarthritis, unspecified knee: Secondary | ICD-10-CM | POA: Diagnosis not present

## 2020-08-03 DIAGNOSIS — L97929 Non-pressure chronic ulcer of unspecified part of left lower leg with unspecified severity: Secondary | ICD-10-CM | POA: Diagnosis not present

## 2020-08-03 DIAGNOSIS — L03119 Cellulitis of unspecified part of limb: Secondary | ICD-10-CM | POA: Diagnosis not present

## 2020-08-04 DIAGNOSIS — E1142 Type 2 diabetes mellitus with diabetic polyneuropathy: Secondary | ICD-10-CM | POA: Diagnosis not present

## 2020-08-04 DIAGNOSIS — G2581 Restless legs syndrome: Secondary | ICD-10-CM | POA: Diagnosis not present

## 2020-08-04 DIAGNOSIS — D869 Sarcoidosis, unspecified: Secondary | ICD-10-CM | POA: Diagnosis not present

## 2020-08-04 DIAGNOSIS — E785 Hyperlipidemia, unspecified: Secondary | ICD-10-CM | POA: Diagnosis not present

## 2020-08-04 DIAGNOSIS — E2740 Unspecified adrenocortical insufficiency: Secondary | ICD-10-CM | POA: Diagnosis not present

## 2020-08-04 DIAGNOSIS — M1710 Unilateral primary osteoarthritis, unspecified knee: Secondary | ICD-10-CM | POA: Diagnosis not present

## 2020-08-04 DIAGNOSIS — N1831 Chronic kidney disease, stage 3a: Secondary | ICD-10-CM | POA: Diagnosis not present

## 2020-08-04 DIAGNOSIS — I129 Hypertensive chronic kidney disease with stage 1 through stage 4 chronic kidney disease, or unspecified chronic kidney disease: Secondary | ICD-10-CM | POA: Diagnosis not present

## 2020-08-04 DIAGNOSIS — E1143 Type 2 diabetes mellitus with diabetic autonomic (poly)neuropathy: Secondary | ICD-10-CM | POA: Diagnosis not present

## 2020-08-04 DIAGNOSIS — E1122 Type 2 diabetes mellitus with diabetic chronic kidney disease: Secondary | ICD-10-CM | POA: Diagnosis not present

## 2020-08-07 DIAGNOSIS — M1710 Unilateral primary osteoarthritis, unspecified knee: Secondary | ICD-10-CM | POA: Diagnosis not present

## 2020-08-07 DIAGNOSIS — D869 Sarcoidosis, unspecified: Secondary | ICD-10-CM | POA: Diagnosis not present

## 2020-08-07 DIAGNOSIS — E1142 Type 2 diabetes mellitus with diabetic polyneuropathy: Secondary | ICD-10-CM | POA: Diagnosis not present

## 2020-08-07 DIAGNOSIS — E1122 Type 2 diabetes mellitus with diabetic chronic kidney disease: Secondary | ICD-10-CM | POA: Diagnosis not present

## 2020-08-07 DIAGNOSIS — G2581 Restless legs syndrome: Secondary | ICD-10-CM | POA: Diagnosis not present

## 2020-08-07 DIAGNOSIS — E2740 Unspecified adrenocortical insufficiency: Secondary | ICD-10-CM | POA: Diagnosis not present

## 2020-08-07 DIAGNOSIS — N1831 Chronic kidney disease, stage 3a: Secondary | ICD-10-CM | POA: Diagnosis not present

## 2020-08-07 DIAGNOSIS — E1143 Type 2 diabetes mellitus with diabetic autonomic (poly)neuropathy: Secondary | ICD-10-CM | POA: Diagnosis not present

## 2020-08-07 DIAGNOSIS — I129 Hypertensive chronic kidney disease with stage 1 through stage 4 chronic kidney disease, or unspecified chronic kidney disease: Secondary | ICD-10-CM | POA: Diagnosis not present

## 2020-08-07 DIAGNOSIS — E785 Hyperlipidemia, unspecified: Secondary | ICD-10-CM | POA: Diagnosis not present

## 2020-08-09 DIAGNOSIS — N1831 Chronic kidney disease, stage 3a: Secondary | ICD-10-CM | POA: Diagnosis not present

## 2020-08-09 DIAGNOSIS — M1710 Unilateral primary osteoarthritis, unspecified knee: Secondary | ICD-10-CM | POA: Diagnosis not present

## 2020-08-09 DIAGNOSIS — E1142 Type 2 diabetes mellitus with diabetic polyneuropathy: Secondary | ICD-10-CM | POA: Diagnosis not present

## 2020-08-09 DIAGNOSIS — E785 Hyperlipidemia, unspecified: Secondary | ICD-10-CM | POA: Diagnosis not present

## 2020-08-09 DIAGNOSIS — E1122 Type 2 diabetes mellitus with diabetic chronic kidney disease: Secondary | ICD-10-CM | POA: Diagnosis not present

## 2020-08-09 DIAGNOSIS — E1143 Type 2 diabetes mellitus with diabetic autonomic (poly)neuropathy: Secondary | ICD-10-CM | POA: Diagnosis not present

## 2020-08-09 DIAGNOSIS — E2740 Unspecified adrenocortical insufficiency: Secondary | ICD-10-CM | POA: Diagnosis not present

## 2020-08-09 DIAGNOSIS — D869 Sarcoidosis, unspecified: Secondary | ICD-10-CM | POA: Diagnosis not present

## 2020-08-09 DIAGNOSIS — I129 Hypertensive chronic kidney disease with stage 1 through stage 4 chronic kidney disease, or unspecified chronic kidney disease: Secondary | ICD-10-CM | POA: Diagnosis not present

## 2020-08-09 DIAGNOSIS — G2581 Restless legs syndrome: Secondary | ICD-10-CM | POA: Diagnosis not present

## 2020-08-13 DIAGNOSIS — S81812A Laceration without foreign body, left lower leg, initial encounter: Secondary | ICD-10-CM | POA: Diagnosis not present

## 2020-08-14 DIAGNOSIS — D869 Sarcoidosis, unspecified: Secondary | ICD-10-CM | POA: Diagnosis not present

## 2020-08-14 DIAGNOSIS — E1143 Type 2 diabetes mellitus with diabetic autonomic (poly)neuropathy: Secondary | ICD-10-CM | POA: Diagnosis not present

## 2020-08-14 DIAGNOSIS — I129 Hypertensive chronic kidney disease with stage 1 through stage 4 chronic kidney disease, or unspecified chronic kidney disease: Secondary | ICD-10-CM | POA: Diagnosis not present

## 2020-08-14 DIAGNOSIS — E2740 Unspecified adrenocortical insufficiency: Secondary | ICD-10-CM | POA: Diagnosis not present

## 2020-08-14 DIAGNOSIS — E785 Hyperlipidemia, unspecified: Secondary | ICD-10-CM | POA: Diagnosis not present

## 2020-08-14 DIAGNOSIS — E1122 Type 2 diabetes mellitus with diabetic chronic kidney disease: Secondary | ICD-10-CM | POA: Diagnosis not present

## 2020-08-14 DIAGNOSIS — N1831 Chronic kidney disease, stage 3a: Secondary | ICD-10-CM | POA: Diagnosis not present

## 2020-08-14 DIAGNOSIS — M1710 Unilateral primary osteoarthritis, unspecified knee: Secondary | ICD-10-CM | POA: Diagnosis not present

## 2020-08-14 DIAGNOSIS — G2581 Restless legs syndrome: Secondary | ICD-10-CM | POA: Diagnosis not present

## 2020-08-14 DIAGNOSIS — E1142 Type 2 diabetes mellitus with diabetic polyneuropathy: Secondary | ICD-10-CM | POA: Diagnosis not present

## 2020-08-16 DIAGNOSIS — D869 Sarcoidosis, unspecified: Secondary | ICD-10-CM | POA: Diagnosis not present

## 2020-08-16 DIAGNOSIS — N1831 Chronic kidney disease, stage 3a: Secondary | ICD-10-CM | POA: Diagnosis not present

## 2020-08-16 DIAGNOSIS — E1122 Type 2 diabetes mellitus with diabetic chronic kidney disease: Secondary | ICD-10-CM | POA: Diagnosis not present

## 2020-08-16 DIAGNOSIS — E785 Hyperlipidemia, unspecified: Secondary | ICD-10-CM | POA: Diagnosis not present

## 2020-08-16 DIAGNOSIS — E1142 Type 2 diabetes mellitus with diabetic polyneuropathy: Secondary | ICD-10-CM | POA: Diagnosis not present

## 2020-08-16 DIAGNOSIS — G2581 Restless legs syndrome: Secondary | ICD-10-CM | POA: Diagnosis not present

## 2020-08-16 DIAGNOSIS — M1710 Unilateral primary osteoarthritis, unspecified knee: Secondary | ICD-10-CM | POA: Diagnosis not present

## 2020-08-16 DIAGNOSIS — E1143 Type 2 diabetes mellitus with diabetic autonomic (poly)neuropathy: Secondary | ICD-10-CM | POA: Diagnosis not present

## 2020-08-16 DIAGNOSIS — E2740 Unspecified adrenocortical insufficiency: Secondary | ICD-10-CM | POA: Diagnosis not present

## 2020-08-16 DIAGNOSIS — I129 Hypertensive chronic kidney disease with stage 1 through stage 4 chronic kidney disease, or unspecified chronic kidney disease: Secondary | ICD-10-CM | POA: Diagnosis not present

## 2020-08-17 ENCOUNTER — Ambulatory Visit: Payer: Medicare HMO | Admitting: Internal Medicine

## 2020-08-21 DIAGNOSIS — M1710 Unilateral primary osteoarthritis, unspecified knee: Secondary | ICD-10-CM | POA: Diagnosis not present

## 2020-08-21 DIAGNOSIS — I129 Hypertensive chronic kidney disease with stage 1 through stage 4 chronic kidney disease, or unspecified chronic kidney disease: Secondary | ICD-10-CM | POA: Diagnosis not present

## 2020-08-21 DIAGNOSIS — E1122 Type 2 diabetes mellitus with diabetic chronic kidney disease: Secondary | ICD-10-CM | POA: Diagnosis not present

## 2020-08-21 DIAGNOSIS — E1142 Type 2 diabetes mellitus with diabetic polyneuropathy: Secondary | ICD-10-CM | POA: Diagnosis not present

## 2020-08-21 DIAGNOSIS — E1143 Type 2 diabetes mellitus with diabetic autonomic (poly)neuropathy: Secondary | ICD-10-CM | POA: Diagnosis not present

## 2020-08-21 DIAGNOSIS — E785 Hyperlipidemia, unspecified: Secondary | ICD-10-CM | POA: Diagnosis not present

## 2020-08-21 DIAGNOSIS — D869 Sarcoidosis, unspecified: Secondary | ICD-10-CM | POA: Diagnosis not present

## 2020-08-21 DIAGNOSIS — N1831 Chronic kidney disease, stage 3a: Secondary | ICD-10-CM | POA: Diagnosis not present

## 2020-08-21 DIAGNOSIS — E2740 Unspecified adrenocortical insufficiency: Secondary | ICD-10-CM | POA: Diagnosis not present

## 2020-08-21 DIAGNOSIS — G2581 Restless legs syndrome: Secondary | ICD-10-CM | POA: Diagnosis not present

## 2020-08-24 DIAGNOSIS — S81812A Laceration without foreign body, left lower leg, initial encounter: Secondary | ICD-10-CM | POA: Diagnosis not present

## 2020-08-25 DIAGNOSIS — E559 Vitamin D deficiency, unspecified: Secondary | ICD-10-CM | POA: Diagnosis not present

## 2020-08-25 DIAGNOSIS — L97922 Non-pressure chronic ulcer of unspecified part of left lower leg with fat layer exposed: Secondary | ICD-10-CM | POA: Diagnosis not present

## 2020-08-25 DIAGNOSIS — D860 Sarcoidosis of lung: Secondary | ICD-10-CM | POA: Diagnosis not present

## 2020-08-28 DIAGNOSIS — M810 Age-related osteoporosis without current pathological fracture: Secondary | ICD-10-CM | POA: Diagnosis not present

## 2020-08-28 DIAGNOSIS — E782 Mixed hyperlipidemia: Secondary | ICD-10-CM | POA: Diagnosis not present

## 2020-08-28 DIAGNOSIS — M8580 Other specified disorders of bone density and structure, unspecified site: Secondary | ICD-10-CM | POA: Diagnosis not present

## 2020-08-28 DIAGNOSIS — I10 Essential (primary) hypertension: Secondary | ICD-10-CM | POA: Diagnosis not present

## 2020-08-28 DIAGNOSIS — N189 Chronic kidney disease, unspecified: Secondary | ICD-10-CM | POA: Diagnosis not present

## 2020-08-28 DIAGNOSIS — H409 Unspecified glaucoma: Secondary | ICD-10-CM | POA: Diagnosis not present

## 2020-08-28 DIAGNOSIS — N183 Chronic kidney disease, stage 3 unspecified: Secondary | ICD-10-CM | POA: Diagnosis not present

## 2020-08-28 DIAGNOSIS — M1990 Unspecified osteoarthritis, unspecified site: Secondary | ICD-10-CM | POA: Diagnosis not present

## 2020-08-28 DIAGNOSIS — E1142 Type 2 diabetes mellitus with diabetic polyneuropathy: Secondary | ICD-10-CM | POA: Diagnosis not present

## 2020-08-28 DIAGNOSIS — E1122 Type 2 diabetes mellitus with diabetic chronic kidney disease: Secondary | ICD-10-CM | POA: Diagnosis not present

## 2020-08-29 ENCOUNTER — Encounter (HOSPITAL_BASED_OUTPATIENT_CLINIC_OR_DEPARTMENT_OTHER): Payer: Medicare HMO | Attending: Internal Medicine | Admitting: Internal Medicine

## 2020-08-29 DIAGNOSIS — E11622 Type 2 diabetes mellitus with other skin ulcer: Secondary | ICD-10-CM | POA: Diagnosis not present

## 2020-08-29 DIAGNOSIS — E1122 Type 2 diabetes mellitus with diabetic chronic kidney disease: Secondary | ICD-10-CM | POA: Diagnosis not present

## 2020-08-29 DIAGNOSIS — I129 Hypertensive chronic kidney disease with stage 1 through stage 4 chronic kidney disease, or unspecified chronic kidney disease: Secondary | ICD-10-CM | POA: Diagnosis not present

## 2020-08-29 DIAGNOSIS — N183 Chronic kidney disease, stage 3 unspecified: Secondary | ICD-10-CM | POA: Diagnosis not present

## 2020-08-29 DIAGNOSIS — I872 Venous insufficiency (chronic) (peripheral): Secondary | ICD-10-CM | POA: Insufficient documentation

## 2020-08-29 DIAGNOSIS — Z882 Allergy status to sulfonamides status: Secondary | ICD-10-CM | POA: Diagnosis not present

## 2020-08-29 DIAGNOSIS — E1139 Type 2 diabetes mellitus with other diabetic ophthalmic complication: Secondary | ICD-10-CM | POA: Insufficient documentation

## 2020-08-29 DIAGNOSIS — M1990 Unspecified osteoarthritis, unspecified site: Secondary | ICD-10-CM | POA: Insufficient documentation

## 2020-08-29 DIAGNOSIS — E1140 Type 2 diabetes mellitus with diabetic neuropathy, unspecified: Secondary | ICD-10-CM | POA: Diagnosis not present

## 2020-08-29 DIAGNOSIS — L97821 Non-pressure chronic ulcer of other part of left lower leg limited to breakdown of skin: Secondary | ICD-10-CM | POA: Diagnosis not present

## 2020-08-29 DIAGNOSIS — H409 Unspecified glaucoma: Secondary | ICD-10-CM | POA: Diagnosis not present

## 2020-08-29 DIAGNOSIS — L97822 Non-pressure chronic ulcer of other part of left lower leg with fat layer exposed: Secondary | ICD-10-CM | POA: Diagnosis not present

## 2020-08-29 DIAGNOSIS — Z809 Family history of malignant neoplasm, unspecified: Secondary | ICD-10-CM | POA: Insufficient documentation

## 2020-08-29 DIAGNOSIS — Z881 Allergy status to other antibiotic agents status: Secondary | ICD-10-CM | POA: Insufficient documentation

## 2020-08-29 DIAGNOSIS — Z888 Allergy status to other drugs, medicaments and biological substances status: Secondary | ICD-10-CM | POA: Diagnosis not present

## 2020-08-29 DIAGNOSIS — E785 Hyperlipidemia, unspecified: Secondary | ICD-10-CM | POA: Diagnosis not present

## 2020-08-29 DIAGNOSIS — Z885 Allergy status to narcotic agent status: Secondary | ICD-10-CM | POA: Diagnosis not present

## 2020-08-29 DIAGNOSIS — H42 Glaucoma in diseases classified elsewhere: Secondary | ICD-10-CM | POA: Insufficient documentation

## 2020-08-29 DIAGNOSIS — D869 Sarcoidosis, unspecified: Secondary | ICD-10-CM | POA: Diagnosis not present

## 2020-08-29 DIAGNOSIS — Z886 Allergy status to analgesic agent status: Secondary | ICD-10-CM | POA: Diagnosis not present

## 2020-08-29 DIAGNOSIS — E1151 Type 2 diabetes mellitus with diabetic peripheral angiopathy without gangrene: Secondary | ICD-10-CM | POA: Insufficient documentation

## 2020-08-29 NOTE — Progress Notes (Signed)
Sonya Goodwin, Sonya Goodwin ([id number]) Visit Report for 08/29/2020 Abuse/Suicide Risk Screen Details Patient Name: Date of Service: Sonya [place], [name] 08/29/2020 10:30 A M Medical Record Number: [id number] Patient Account Number: [id number] Date of Birth/Sex: Treating RN: 1941/08/20 (79 y.o. [name] Primary Care Jediah Horger: Nicholes Stairs Other Clinician: Referring Leaf [employer]: Treating Adeyemi Hamad/Extender: Nyra Jabs in Treatment: 0 Abuse/Suicide Risk Screen Items Answer ABUSE RISK SCREEN: Has anyone close to you tried to hurt or harm you recentlyo No Do you feel uncomfortable with anyone in your familyo No Has anyone forced you do things that you didnt want to doo No Electronic Signature(s) Signed: 08/29/2020 4:57:23 PM By: Baruch Gouty RN, BSN Entered By: Baruch Gouty on 08/29/2020 11:14:37 -------------------------------------------------------------------------------- Activities of Daily Living Details Patient Name: Date of Service: Sonya, Goodwin 08/29/2020 10:30 A M Medical Record Number: [id number] Patient Account Number: [id number] Date of Birth/Sex: Treating RN: Sep 01, 1941 (79 y.o. [name] Primary Care Chaylee Ehrsam: Nicholes Stairs Other Clinician: Referring Damien Cisar: Treating Hermilo Dutter/Extender: Nyra Jabs in Treatment: 0 Activities of Daily Living Items Answer Activities of Daily Living (Please select one for each item) Drive Automobile Not Able T Medications ake Need Assistance Use T elephone Completely Able Care for Appearance Completely Able Use T oilet Completely Able Bath / Shower Completely Able Dress Self Completely Able Feed Self Completely Able Walk Need Assistance Get In / Out Bed Completely Able Housework Need Assistance Prepare Meals Need Assistance Handle Money Need Assistance Shop for Self Need Assistance Electronic Signature(s) Signed: 08/29/2020 4:57:23 PM By: Baruch Gouty RN, BSN Entered By: Baruch Gouty on 08/29/2020 11:16:12 -------------------------------------------------------------------------------- Education Screening Details Patient Name: Date of Service: Sonya Raider S. 08/29/2020 10:30 A M Medical Record Number: [id number] Patient Account Number: [id number] Date of Birth/Sex: Treating RN: [birth year] (79 y.o. [name] Primary Care Ronni Osterberg: Nicholes Stairs Other Clinician: Referring Aspyn Warnke: Treating Lio Wehrly/Extender: Nyra Jabs in Treatment: 0 Primary Learner Assessed: Patient Learning Preferences/Education Level/Primary Language Learning Preference: Explanation, Demonstration, Printed Material Highest Education Level: College or Above Preferred Language: English Cognitive Barrier Language Barrier: No Translator Needed: No Memory Deficit: No Emotional Barrier: No Cultural/Religious Beliefs Affecting Medical Care: No Physical Barrier Impaired Vision: No Impaired Hearing: Yes Hearing Aid, deaf in right ear Decreased Hand dexterity: No Knowledge/Comprehension Knowledge Level: High Comprehension Level: High Ability to understand written instructions: High Ability to understand verbal instructions: High Motivation Anxiety Level: Calm Cooperation: Cooperative Education Importance: Acknowledges Need Interest in Health Problems: Asks Questions Perception: Coherent Willingness to Engage in Self-Management High Activities: Readiness to Engage in Self-Management High Activities: Electronic Signature(s) Signed: 08/29/2020 4:57:23 PM By: Baruch Gouty RN, BSN Entered By: Baruch Gouty on 08/29/2020 11:17:10 -------------------------------------------------------------------------------- Fall Risk Assessment Details Patient Name: Date of Service: Sonya [place], [name] 08/29/2020 10:30 A M Medical Record Number: [id number] Patient Account Number: [id number] Date of  Birth/Sex: Treating RN: Mar 28, 1941 (79 y.o. [name] Primary Care Murel Shenberger: Nicholes Stairs Other Clinician: Referring Ameria Sanjurjo: Treating Jacy Howat/Extender: Nyra Jabs in Treatment: 0 Fall Risk Assessment Items Have you had 2 or more falls in the last 12 monthso 0 No Have you had any fall that resulted in injury in the last 12 monthso 0 No FALLS RISK SCREEN History of falling - immediate or within 3 months 0 No Secondary diagnosis (Do you have 2 or more medical diagnoseso) 0 No Ambulatory aid None/bed rest/wheelchair/nurse 0 No Crutches/cane/walker 15 Yes Furniture  0 No Intravenous therapy Access/Saline/Heparin Lock 0 No Gait/Transferring Normal/ bed rest/ wheelchair 0 No Weak (short steps with or without shuffle, stooped but able to lift head while walking, may seek 10 Yes support from furniture) Impaired (short steps with shuffle, may have difficulty arising from chair, head down, impaired 0 No balance) Mental Status Oriented to own ability 0 Yes Electronic Signature(s) Signed: 08/29/2020 4:57:23 PM By: Baruch Gouty RN, BSN Entered By: Baruch Gouty on 08/29/2020 11:17:22 -------------------------------------------------------------------------------- Foot Assessment Details Patient Name: Date of Service: Sonya Raider S. 08/29/2020 10:30 A M Medical Record Number: [id number] Patient Account Number: [id number] Date of Birth/Sex: Treating RN: 1941-10-04 (79 y.o. [name] Primary Care Tynisa Vohs: Nicholes Stairs Other Clinician: Referring Chloie Loney: Treating Annlouise Gerety/Extender: Nyra Jabs in Treatment: 0 Foot Assessment Items Site Locations + = Sensation present, - = Sensation absent, C = Callus, U = Ulcer R = Redness, W = Warmth, M = Maceration, PU = Pre-ulcerative lesion F = Fissure, S = Swelling, D = Dryness Assessment Right: Left: Other Deformity: No No Prior Foot Ulcer: No No Prior  Amputation: No No Charcot Joint: No No Ambulatory Status: Ambulatory With Help Assistance Device: Crutches Gait: Steady Electronic Signature(s) Signed: 08/29/2020 4:57:23 PM By: Baruch Gouty RN, BSN Entered By: Baruch Gouty on 08/29/2020 11:20:10 -------------------------------------------------------------------------------- Nutrition Risk Screening Details Patient Name: Date of Service: Sonya Raider S. 08/29/2020 10:30 A M Medical Record Number: [id number] Patient Account Number: [id number] Date of Birth/Sex: Treating RN: 02/02/1941 (78 y.o. [name] Primary Care Henrine Hayter: Nicholes Stairs Other Clinician: Referring Bobby Barton: Treating Yitta Gongaware/Extender: Nyra Jabs in Treatment: 0 Height (in): 66 Weight (lbs): 141 Body Mass Index (BMI): 22.8 Nutrition Risk Screening Items Score Screening NUTRITION RISK SCREEN: I have an illness or condition that made me change the kind and/or amount of food I eat 0 No I eat fewer than two meals per day 0 No I eat few fruits and vegetables, or milk products 0 No I have three or more drinks of beer, liquor or wine almost every day 0 No I have tooth or mouth problems that make it hard for me to eat 0 No I don't always have enough money to buy the food I need 0 No I eat alone most of the time 0 No I take three or more different prescribed or over-the-counter drugs a day 1 Yes Without wanting to, I have lost or gained 10 pounds in the last six months 0 No I am not always physically able to shop, cook and/or feed myself 0 No Nutrition Protocols Good Risk Protocol 0 No interventions needed Moderate Risk Protocol High Risk Proctocol Risk Level: Good Risk Score: 1 Electronic Signature(s) Signed: 08/29/2020 4:57:23 PM By: Baruch Gouty RN, BSN Entered By: Baruch Gouty on 08/29/2020 11:17:54

## 2020-08-30 NOTE — Progress Notes (Signed)
Sonya Goodwin ([id number]) Visit Report for 08/29/2020 Allergy List Details Patient Name: Date of Service: Sonya Goodwin, Sonya Goodwin 08/29/2020 10:30 A M Medical Record Number: [id number] Patient Account Number: [id number] Date of Birth/Sex: Treating RN: October 14, 1941 (79 y.o. Sonya Goodwin Primary Care Barnett Elzey: Sonya Goodwin Sonya Goodwin: Sonya Goodwin Sonya Goodwin: Treating Sonya Goodwin: Sonya Goodwin in Treatment: 0 Allergies Active Allergies metoclopramide codeine Reaction: itching, rash menthol Reaction: nausea/vomiting Statins-Hmg-Coa Reductase Inhibitors alendronate sodium aspirin Reaction: tinitis fentanyl Reaction: tinitis fenofibrate Fosamax losartan Miacalcin nitrofurantoin NSAIDS (Non-Steroidal Anti-Inflammatory Drug) Prolia Sulfa (Sulfonamide Antibiotics) Reaction: nausea/vomiting tolmetin WelChol Zetia Lovaza Allergy Notes Electronic Signature(s) Signed: 08/29/2020 4:57:23 PM By: Sonya Gouty RN, BSN Entered By: Sonya Goodwin on 08/29/2020 11:00:17 -------------------------------------------------------------------------------- Arrival Information Details Patient Name: Date of Service: Sonya Raider S. 08/29/2020 10:30 A M Medical Record Number: [id number] Patient Account Number: [id number] Date of Birth/Sex: Treating RN: 1941-02-04 (79 y.o. Sonya Goodwin Primary Care Sonya Goodwin: Sonya Goodwin Sonya Goodwin: Sonya Goodwin Henessy Rohrer: Treating Sonya Goodwin/Extender: Sonya Goodwin in Treatment: 0 Visit Information Patient Arrived: Wheel Chair Arrival Time: 10:40 Accompanied By: husband Transfer Assistance: None Patient Identification Verified: Yes Secondary Verification Process Completed: Yes Electronic Signature(s) Signed: 08/30/2020 11:00:36 AM By: Sonya Goodwin Entered By: Sonya Goodwin on 08/29/2020  10:40:41 -------------------------------------------------------------------------------- Clinic Level of Care Assessment Details Patient Name: Date of Service: Sonya Goodwin 08/29/2020 10:30 A M Medical Record Number: [id number] Patient Account Number: [id number] Date of Birth/Sex: Treating RN: 08-07-1941 (79 y.o. Sonya Goodwin Primary Care Srinidhi Landers: Sonya Goodwin Sonya Goodwin: Sonya Goodwin Loni Abdon: Treating Talesha Ellithorpe/Extender: Sonya Goodwin in Treatment: 0 Clinic Level of Care Assessment Items TOOL 1 Quantity Score X- 1 0 Use when EandM and Procedure is performed on INITIAL visit ASSESSMENTS - Nursing Assessment / Reassessment X- 1 20 General Physical Exam (combine w/ comprehensive assessment (listed just below) when performed on new pt. evals) X- 1 25 Comprehensive Assessment (HX, ROS, Risk Assessments, Wounds Hx, etc.) ASSESSMENTS - Wound and Skin Assessment / Reassessment []  - 0 Dermatologic / Skin Assessment (not related to wound area) ASSESSMENTS - Ostomy and/or Continence Assessment and Care []  - 0 Incontinence Assessment and Management []  - 0 Ostomy Care Assessment and Management (repouching, etc.) PROCESS - Coordination of Care X - Simple Patient / Family Education for ongoing care 1 15 []  - 0 Complex (extensive) Patient / Family Education for ongoing care X- 1 10 Staff obtains Programmer, systems, Records, T Results / Process Orders est []  - 0 Staff telephones HHA, Nursing Homes / Clarify orders / etc []  - 0 Routine Transfer to another Facility (non-emergent condition) []  - 0 Routine Hospital Admission (non-emergent condition) X- 1 15 New Admissions / Biomedical engineer / Ordering NPWT Apligraf, etc. , []  - 0 Emergency Hospital Admission (emergent condition) PROCESS - Special Needs []  - 0 Pediatric / Minor Patient Management []  - 0 Isolation Patient Management []  - 0 Hearing / Language / Visual special needs []  -  0 Assessment of Community assistance (transportation, D/C planning, etc.) []  - 0 Additional assistance / Altered mentation []  - 0 Support Surface(s) Assessment (bed, cushion, seat, etc.) INTERVENTIONS - Miscellaneous []  - 0 External ear exam []  - 0 Patient Transfer (multiple staff / Civil Service fast streamer / Similar devices) []  - 0 Simple Staple / Suture removal (25 or less) []  - 0 Complex Staple / Suture removal (26 or more) []  - 0 Hypo/Hyperglycemic Management (do not check if billed separately) X- 1 15 Ankle /  Brachial Index (ABI) - do not check if billed separately Has the patient been seen at the hospital within the last three years: Yes Total Score: 100 Level Of Care: New/Established - Level 3 Electronic Signature(s) Signed: 08/30/2020 10:37:46 AM By: Sonya Coria RN Entered By: Sonya Goodwin on 08/29/2020 12:25:47 -------------------------------------------------------------------------------- Lower Extremity Assessment Details Patient Name: Date of Service: Sonya Goodwin, Sonya Goodwin 08/29/2020 10:30 A M Medical Record Number: [id number] Patient Account Number: [id number] Date of Birth/Sex: Treating RN: [birth year] (79 y.o. Sonya Goodwin, Sonya Goodwin Primary Care Ellysa Parrack: Sonya Goodwin Sonya Goodwin: Sonya Goodwin Chanetta Moosman: Treating Cassey Hurrell/Extender: Sonya Goodwin in Treatment: 0 Edema Assessment Assessed: Sonya Goodwin: No] Sonya Goodwin: No] Edema: [Left: No] [Right: No] Calf Left: Right: Point of Measurement: cm From Medial Instep 20.9 cm 23 cm Ankle Left: Right: Point of Measurement: cm From Medial Instep 16.5 cm 17.3 cm Vascular Assessment Pulses: Dorsalis Pedis Palpable: [Left:Yes] [Right:Yes] Blood Pressure: Brachial: [Left:155] [Right:155] Ankle: [Right:Dorsalis Pedis: 158] Ankle Brachial Index: [Right:1.02] Notes left DP and PT noncompressible, right PT noncompressible Electronic Signature(s) Signed: 08/29/2020 4:57:23 PM By: Sonya Gouty RN, BSN Entered By:  Sonya Goodwin on 08/29/2020 11:30:09 -------------------------------------------------------------------------------- Multi Wound Chart Details Patient Name: Date of Service: Sonya Raider S. 08/29/2020 10:30 A M Medical Record Number: [id number] Patient Account Number: [id number] Date of Birth/Sex: Treating RN: 31-Oct-1941 (79 y.o. Sonya Goodwin Primary Care Alexsia Klindt: Sonya Goodwin Sonya Goodwin: Sonya Goodwin Tomica Arseneault: Treating Abby Stines/Extender: Sonya Goodwin in Treatment: 0 Vital Signs Height(in): 18 Pulse(bpm): 51 Weight(lbs): 141 Blood Pressure(mmHg): 155/78 Body Mass Index(BMI): 23 Temperature(F): 98.2 Respiratory Rate(breaths/min): 18 Photos: [1:No Photos Left, Anterior Lower Leg] [2:No Photos Left, Lateral Lower Leg] [N/A:N/A N/A] Wound Location: [1:Gradually Appeared] [2:Gradually Appeared] [N/A:N/A] Wounding Event: [1:Venous Leg Ulcer] [2:Diabetic Wound/Ulcer of the Lower] [N/A:N/A] Primary Etiology: [1:N/A] [2:Extremity Venous Leg Ulcer] [N/A:N/A] Secondary Etiology: [1:Cataracts, Glaucoma, Hypertension, Cataracts, Glaucoma, Hypertension, N/A] Comorbid History: [1:Type II Diabetes, Osteoarthritis, Neuropathy 06/15/2020] [2:Type II Diabetes, Osteoarthritis, Neuropathy 06/15/2020] [N/A:N/A] Date Acquired: [1:0] [2:0] [N/A:N/A] Weeks of Treatment: [1:Open] [2:Open] [N/A:N/A] Wound Status: [1:1.5x1.2x0.1] [2:1.4x0.7x0.1] [N/A:N/A] Measurements L x W x D (cm) [1:1.414] [2:0.77] [N/A:N/A] A (cm) : rea [1:0.141] [2:0.077] [N/A:N/A] Volume (cm) : [1:Full Thickness Without Exposed] [2:Grade 1] [N/A:N/A] Classification: [1:Support Structures Small] [2:Small] [N/A:N/A] Exudate A mount: [1:Serous] [2:Serous] [N/A:N/A] Exudate Type: [1:amber] [2:amber] [N/A:N/A] Exudate Color: [1:Flat and Intact] [2:Flat and Intact] [N/A:N/A] Wound Margin: [1:Small (1-33%)] [2:Small (1-33%)] [N/A:N/A] Granulation A mount: [1:Pink] [2:Pink] [N/A:N/A] Granulation  Quality: [1:Large (67-100%)] [2:Large (67-100%)] [N/A:N/A] Necrotic A mount: [1:Fat Layer (Subcutaneous Tissue): Yes Fat Layer (Subcutaneous Tissue): Yes N/A] Exposed Structures: [1:Fascia: No Tendon: No Muscle: No Joint: No Bone: No None] [2:Fascia: No Tendon: No Muscle: No Joint: No Bone: No None] [N/A:N/A] Epithelialization: [1:Debridement - Excisional] [2:Debridement - Excisional] [N/A:N/A] Debridement: Pre-procedure Verification/Time Out 12:01 [2:12:01] [N/A:N/A] Taken: [1:Lidocaine 5% topical ointment] [2:Lidocaine 5% topical ointment] [N/A:N/A] Pain Control: [1:Subcutaneous, Slough] [2:Subcutaneous, Slough] [N/A:N/A] Tissue Debrided: [1:Skin/Subcutaneous Tissue] [2:Skin/Subcutaneous Tissue] [N/A:N/A] Level: [1:1.8] [2:0.98] [N/A:N/A] Debridement A (sq cm): [1:rea Curette] [2:Curette] [N/A:N/A] Instrument: [1:Minimum] [2:Minimum] [N/A:N/A] Bleeding: [1:Pressure] [2:Pressure] [N/A:N/A] Hemostasis A chieved: [1:0] [2:0] [N/A:N/A] Procedural Pain: [1:0] [2:0] [N/A:N/A] Post Procedural Pain: [1:Procedure was tolerated well] [2:Procedure was tolerated well] [N/A:N/A] Debridement Treatment Response: [1:1.5x1.2x0.1] [2:1.4x0.7x0.1] [N/A:N/A] Post Debridement Measurements L x W x D (cm) [1:0.141] [2:0.077] [N/A:N/A] Post Debridement Volume: (cm) [1:Debridement] [2:Debridement] [N/A:N/A] Treatment Notes Electronic Signature(s) Signed: 08/29/2020 5:08:08 PM By: Linton Ham MD Signed: 08/30/2020 10:37:46 AM By: Sonya Coria RN Entered By: Dellia Nims,  Legrand Como on 08/29/2020 12:38:53 -------------------------------------------------------------------------------- Multi-Disciplinary Care Plan Details Patient Name: Date of Service: [place], Sonya Goodwin 08/29/2020 10:30 A M Medical Record Number: [id number] Patient Account Number: [id number] Date of Birth/Sex: Treating RN: 05-18-1941 (79 y.o. Sonya Goodwin Primary Care Syriana Croslin: Sonya Goodwin Sonya Goodwin: Sonya Goodwin Hannan Tetzlaff: Treating  Khaalid Lefkowitz/Extender: Sonya Goodwin in Treatment: 0 Active Inactive Wound/Skin Impairment Nursing Diagnoses: Knowledge deficit related to ulceration/compromised skin integrity Goals: Patient/caregiver will verbalize understanding of skin care regimen Date Initiated: 08/29/2020 Target Resolution Date: 09/28/2020 Goal Status: Active Ulcer/skin breakdown will have a volume reduction of 30% by week 4 Date Initiated: 08/29/2020 Target Resolution Date: 09/28/2020 Goal Status: Active Interventions: Assess patient/caregiver ability to obtain necessary supplies Assess patient/caregiver ability to perform ulcer/skin care regimen upon admission and as needed Assess ulceration(s) every visit Notes: Electronic Signature(s) Signed: 08/30/2020 10:37:46 AM By: Sonya Coria RN Entered By: Sonya Goodwin on 08/29/2020 11:57:15 -------------------------------------------------------------------------------- Pain Assessment Details Patient Name: Date of Service: Sonya Goodwin, Sonya Goodwin 08/29/2020 10:30 A M Medical Record Number: [id number] Patient Account Number: [id number] Date of Birth/Sex: Treating RN: [birth year] (79 y.o. Sonya Goodwin Primary Care Zamiah Tollett: Sonya Goodwin Sonya Goodwin: Sonya Goodwin Airanna Partin: Treating Chaynce Schafer/Extender: Sonya Goodwin in Treatment: 0 Active Problems Location of Pain Severity and Description of Pain Patient Has Paino Yes Site Locations Pain Location: Pain in Ulcers With Dressing Change: Yes Duration of the Pain. Constant / Intermittento Intermittent Rate the pain. Current Pain Level: 0 Worst Pain Level: 8 Least Pain Level: 0 Character of Pain Describe the Pain: Sharp, Shooting Pain Management and Medication Current Pain Management: Medication: Yes Sonya: time How does your wound impact your activities of daily livingo Sleep: Yes Bathing: No Appetite: No Relationship With Others: No Bladder Continence:  No Emotions: Yes Bowel Continence: No Work: No Toileting: No Drive: No Dressing: No Hobbies: No Electronic Signature(s) Signed: 08/29/2020 4:57:23 PM By: Sonya Gouty RN, BSN Entered By: Sonya Goodwin on 08/29/2020 11:38:44 -------------------------------------------------------------------------------- Patient/Caregiver Education Details Patient Name: Date of Service: Sonya Goodwin 8/31/2021andnbsp10:30 A M Medical Record Number: [id number] Patient Account Number: [id number] Date of Birth/Gender: Treating RN: 1941/06/16 (79 y.o. Sonya Goodwin Primary Care Physician: Sonya Goodwin Sonya Goodwin: Sonya Goodwin Physician: Treating Physician/Extender: Sonya Goodwin in Treatment: 0 Education Assessment Education Provided To: Patient Education Topics Provided Wound/Skin Impairment: Methods: Explain/Verbal Responses: State content correctly Electronic Signature(s) Signed: 08/30/2020 10:37:46 AM By: Sonya Coria RN Entered By: Sonya Goodwin on 08/29/2020 11:57:27 -------------------------------------------------------------------------------- Wound Assessment Details Patient Name: Date of Service: Sonya Goodwin, Sonya Goodwin 08/29/2020 10:30 A M Medical Record Number: [id number] Patient Account Number: [id number] Date of Birth/Sex: Treating RN: 1941/07/30 (79 y.o. Sonya Goodwin Primary Care Cheney Ewart: Sonya Goodwin Sonya Goodwin: Sonya Goodwin Seiji Wiswell: Treating Taesha Goodell/Extender: Sonya Goodwin in Treatment: 0 Wound Status Wound Number: 1 Primary Venous Leg Ulcer Etiology: Wound Location: Left, Anterior Lower Leg Wound Open Wounding Event: Gradually Appeared Status: Date Acquired: 06/15/2020 Comorbid Cataracts, Glaucoma, Hypertension, Type II Diabetes, Weeks Of Treatment: 0 History: Osteoarthritis, Neuropathy Clustered Wound: No Wound Measurements Length: (cm) 1.5 Width: (cm) 1.2 Depth: (cm) 0.1 Area: (cm)  1.414 Volume: (cm) 0.141 % Reduction in Area: % Reduction in Volume: Epithelialization: None Tunneling: No Undermining: No Wound Description Classification: Full Thickness Without Exposed Support Structures Wound Margin: Flat and Intact Exudate Amount: Small Exudate Type: Serous Exudate Color: amber Foul Odor After Cleansing: No Slough/Fibrino Yes Wound Bed Granulation Amount: Small (1-33%) Exposed  Structure Granulation Quality: Pink Fascia Exposed: No Necrotic Amount: Large (67-100%) Fat Layer (Subcutaneous Tissue) Exposed: Yes Necrotic Quality: Adherent Slough Tendon Exposed: No Muscle Exposed: No Joint Exposed: No Bone Exposed: No Electronic Signature(s) Signed: 08/29/2020 4:57:23 PM By: Sonya Gouty RN, BSN Entered By: Sonya Goodwin on 08/29/2020 11:33:32 -------------------------------------------------------------------------------- Wound Assessment Details Patient Name: Date of Service: Sonya Raider S. 08/29/2020 10:30 A M Medical Record Number: [id number] Patient Account Number: [id number] Date of Birth/Sex: Treating RN: 1941/12/15 (79 y.o. Sonya Goodwin Primary Care Cashel Bellina: Sonya Goodwin Sonya Goodwin: Sonya Goodwin Shriley Joffe: Treating Bobbette Eakes/Extender: Sonya Goodwin in Treatment: 0 Wound Status Wound Number: 2 Primary Diabetic Wound/Ulcer of the Lower Extremity Etiology: Wound Location: Left, Lateral Lower Leg Secondary Venous Leg Ulcer Wounding Event: Gradually Appeared Etiology: Date Acquired: 06/15/2020 Date Acquired: 06/15/2020 Wound Status: Open Weeks Of Treatment: 0 Comorbid Cataracts, Glaucoma, Hypertension, Type II Diabetes, Clustered Wound: No History: Osteoarthritis, Neuropathy Wound Measurements Length: (cm) 1.4 Width: (cm) 0.7 Depth: (cm) 0.1 Area: (cm) 0.77 Volume: (cm) 0.077 % Reduction in Area: % Reduction in Volume: Epithelialization: None Tunneling: No Undermining: No Wound  Description Classification: Grade 1 Wound Margin: Flat and Intact Exudate Amount: Small Exudate Type: Serous Exudate Color: amber Foul Odor After Cleansing: No Slough/Fibrino Yes Wound Bed Granulation Amount: Small (1-33%) Exposed Structure Granulation Quality: Pink Fascia Exposed: No Necrotic Amount: Large (67-100%) Fat Layer (Subcutaneous Tissue) Exposed: Yes Necrotic Quality: Adherent Slough Tendon Exposed: No Muscle Exposed: No Joint Exposed: No Bone Exposed: No Electronic Signature(s) Signed: 08/29/2020 4:57:23 PM By: Sonya Gouty RN, BSN Entered By: Sonya Goodwin on 08/29/2020 11:35:01 -------------------------------------------------------------------------------- [place] Details Patient Name: Date of Service: Sonya Raider S. 08/29/2020 10:30 A M Medical Record Number: [id number] Patient Account Number: [id number] Date of Birth/Sex: Treating RN: 12-27-1941 (79 y.o. Sonya Goodwin Primary Care Pihu Basil: Sonya Goodwin Sonya Goodwin: Sonya Goodwin Chardai Gangemi: Treating Doren Kaspar/Extender: Sonya Goodwin in Treatment: 0 Vital Signs Time Taken: 10:40 Temperature (F): 98.2 Height (in): 66 Pulse (bpm): 97 Source: Stated Respiratory Rate (breaths/min): 18 Weight (lbs): 141 Blood Pressure (mmHg): 155/78 Source: Stated Reference Range: 80 - 120 mg / dl Body Mass Index (BMI): 22.8 Electronic Signature(s) Signed: 08/30/2020 11:00:36 AM By: Sonya Goodwin Entered By: Sonya Goodwin on 08/29/2020 10:41:12

## 2020-08-30 NOTE — Progress Notes (Signed)
Sonya Goodwin, Sonya Goodwin ([id number]) Visit Report for 08/29/2020 Chief Complaint Document Details Patient Name: Date of Service: Sonya Goodwin, [name] 08/29/2020 10:30 A M Medical Record Number: [id number] Patient Account Number: [id number] Date of Birth/Sex: Treating RN: Dec 24, 1941 (79 y.o. [name] Primary Care Provider: Nicholes Stairs Other Clinician: Referring Provider: Treating Provider/Extender: Nyra Jabs in Treatment: 0 Information Obtained from: Patient Chief Complaint 08/29/2020; patient is here for review of wounds on the left anterior lower leg Electronic Signature(s) Signed: 08/29/2020 5:08:08 PM By: Linton Ham MD Entered By: Linton Ham on 08/29/2020 12:39:14 -------------------------------------------------------------------------------- Debridement Details Patient Name: Date of Service: Sonya Chambers. 08/29/2020 10:30 A M Medical Record Number: [id number] Patient Account Number: [id number] Date of Birth/Sex: Treating RN: 11-Oct-1941 (79 y.o. [name] Primary Care Provider: Nicholes Stairs Other Clinician: Referring Provider: Treating Provider/Extender: Nyra Jabs in Treatment: 0 Debridement Performed for Assessment: Wound #1 Left,Anterior Lower Leg Performed By: Physician Ricard Dillon., MD Debridement Type: Debridement Severity of Tissue Pre Debridement: Fat layer exposed Level of Consciousness (Pre-procedure): Awake and Alert Pre-procedure Verification/Time Out Yes - 12:01 Taken: Start Time: 12:01 Pain Control: Lidocaine 5% topical ointment T Area Debrided (L x W): otal 1.5 (cm) x 1.2 (cm) = 1.8 (cm) Tissue and other material debrided: Viable, Non-Viable, Slough, Subcutaneous, Skin: Dermis , Skin: Epidermis, Slough Level: Skin/Subcutaneous Tissue Debridement Description: Excisional Instrument: Curette Bleeding: Minimum Hemostasis Achieved: Pressure End Time: 12:01 Procedural  Pain: 0 Post Procedural Pain: 0 Response to Treatment: Procedure was tolerated well Level of Consciousness (Post- Awake and Alert procedure): Post Debridement Measurements of Total Wound Length: (cm) 1.5 Width: (cm) 1.2 Depth: (cm) 0.1 Volume: (cm) 0.141 Character of Wound/Ulcer Post Debridement: Improved Severity of Tissue Post Debridement: Fat layer exposed Post Procedure Diagnosis Same as Pre-procedure Electronic Signature(s) Signed: 08/29/2020 5:08:08 PM By: Linton Ham MD Signed: 08/30/2020 10:37:46 AM By: Carlene Coria RN Entered By: Carlene Coria on 08/29/2020 12:02:13 -------------------------------------------------------------------------------- Debridement Details Patient Name: Date of Service: Sonya Raider S. 08/29/2020 10:30 A M Medical Record Number: [id number] Patient Account Number: [id number] Date of Birth/Sex: Treating RN: 05-Aug-1941 (79 y.o. [name] Primary Care Provider: Nicholes Stairs Other Clinician: Referring Provider: Treating Provider/Extender: Nyra Jabs in Treatment: 0 Debridement Performed for Assessment: Wound #2 Left,Lateral Lower Leg Performed By: Physician Ricard Dillon., MD Debridement Type: Debridement Severity of Tissue Pre Debridement: Fat layer exposed Level of Consciousness (Pre-procedure): Awake and Alert Pre-procedure Verification/Time Out Yes - 12:01 Taken: Start Time: 12:01 Pain Control: Lidocaine 5% topical ointment T Area Debrided (L x W): otal 1.4 (cm) x 0.7 (cm) = 0.98 (cm) Tissue and other material debrided: Viable, Non-Viable, Slough, Subcutaneous, Skin: Dermis , Skin: Epidermis, Slough Level: Skin/Subcutaneous Tissue Debridement Description: Excisional Instrument: Curette Bleeding: Minimum Hemostasis Achieved: Pressure End Time: 12:01 Procedural Pain: 0 Post Procedural Pain: 0 Response to Treatment: Procedure was tolerated well Level of Consciousness (Post- Awake and  Alert procedure): Post Debridement Measurements of Total Wound Length: (cm) 1.4 Width: (cm) 0.7 Depth: (cm) 0.1 Volume: (cm) 0.077 Character of Wound/Ulcer Post Debridement: Improved Severity of Tissue Post Debridement: Fat layer exposed Post Procedure Diagnosis Same as Pre-procedure Electronic Signature(s) Signed: 08/29/2020 5:08:08 PM By: Linton Ham MD Signed: 08/30/2020 10:37:46 AM By: Carlene Coria RN Entered By: Carlene Coria on 08/29/2020 12:04:41 -------------------------------------------------------------------------------- HPI Details Patient Name: Date of Service: Sonya Raider S. 08/29/2020 10:30 A M Medical Record Number: [id number] Patient Account  Number: [id number] Date of Birth/Sex: Treating RN: [birth year] (79 y.o. [name] Primary Care Provider: Nicholes Stairs Other Clinician: Referring Provider: Treating Provider/Extender: Nyra Jabs in Treatment: 0 History of Present Illness HPI Description: ADMISSION 08/29/2020 This is a 79 year old woman who has wounds on her left lower leg mid aspect. She states that these have been present since June when she was hospitalized with a UTI, delirium possibly medication issues. I do not see any mention of the wounds on her left leg at that time. In any case these develop sometime after this. The patient is followed by Dr. Jacelyn Grip at [place] at [facility]. She has been using bacitracin ointment to the wounds. She has [facility] home health although I am not exactly sure what they are doing. Her husband is angry about a bill from home health again we were not able to help him exactly. The patient has chronic sarcoidosis and adrenal insufficiency she has been on longstanding prednisone. She has all the classic skin features of chronic steroid use. I think this is the primary issue for these wounds. They have also noted weeping fluid coming out of the wounds and even weeping draining fluid on  the right leg although we were not able to define any wound on the right leg. She could very well have a component of chronic venous insufficiency as well. Past medical history includes sarcoidosis, type 2 diabetes with a recent hemoglobin A1c of 6.3, diabetic neuropathy, stage III P chronic renal failure, hypertension, adrenal insufficiency. ABI on the right noncompressible on the left at 1.02 Electronic Signature(s) Signed: 08/29/2020 5:08:08 PM By: Linton Ham MD Entered By: Linton Ham on 08/29/2020 12:43:25 -------------------------------------------------------------------------------- Physical Exam Details Patient Name: Date of Service: Sonya Raider S. 08/29/2020 10:30 A M Medical Record Number: [id number] Patient Account Number: [id number] Date of Birth/Sex: Treating RN: 1941/05/10 (79 y.o. [name] Primary Care Provider: Nicholes Stairs Other Clinician: Referring Provider: Treating Provider/Extender: Nyra Jabs in Treatment: 0 Constitutional Patient is hypertensive.. Pulse regular and within target range for patient.Marland Kitchen Respirations regular, non-labored and within target range.. Temperature is normal and within the target range for the patient.Marland Kitchen Appears in no distress. Cardiovascular Pedal pulses are palpable on the left.. Skin changes of chronic steroid use. Notes Wound exam; the patient has 2 small circular ulcers in the lower tibial area 1 medially and 1 laterally. Both of these 100% covered with necrotic debris requiring debridement with a #3 curette. These cleaned up quite nicely. Notable the post debridement there is weeping edema fluid. Careful inspection of the skin on the right leg did not show any open wound in spite of the history of weeping edema fluid Electronic Signature(s) Signed: 08/29/2020 5:08:08 PM By: Linton Ham MD Entered By: Linton Ham on 08/29/2020  12:46:47 -------------------------------------------------------------------------------- Physician Orders Details Patient Name: Date of Service: Sonya Raider S. 08/29/2020 10:30 A M Medical Record Number: [id number] Patient Account Number: [id number] Date of Birth/Sex: Treating RN: Jun 05, 1941 (79 y.o. [name] Primary Care Provider: Nicholes Stairs Other Clinician: Referring Provider: Treating Provider/Extender: Nyra Jabs in Treatment: 0 Verbal / Phone Orders: No Diagnosis Coding Follow-up Appointments Return Appointment in 2 weeks. Dressing Change Frequency Wound #1 Left,Anterior Lower Leg Change dressing three times week. Wound #2 Left,Lateral Lower Leg Change dressing three times week. Wound Cleansing Wound #1 Left,Anterior Lower Leg Clean wound with Normal Saline. Wound #2 Left,Lateral Lower Leg Clean wound with  Normal Saline. Primary Wound Dressing Wound #1 Left,Anterior Lower Leg Calcium Alginate with Silver Wound #2 Left,Lateral Lower Leg Calcium Alginate with Silver Secondary Dressing Wound #1 Left,Anterior Lower Leg Dry Gauze ABD pad Wound #2 Left,Lateral Lower Leg Dry Gauze ABD pad Edema Control Wound #1 Left,Anterior Lower Leg Kerlix and Coban - Left Lower Extremity Wound #2 Left,Lateral Lower Leg Kerlix and Coban - Left Lower Extremity Home Health Wound #1 Left,Anterior Lower Leg [place] skilled nursing for wound care. [facility] [place] Wound #2 [facility] skilled nursing for wound care. [facility] [place] Electronic Signature(s) Signed: 08/29/2020 5:08:08 PM By: Linton Ham MD Signed: 08/30/2020 10:37:46 AM By: Carlene Coria RN Entered By: Carlene Coria on 08/29/2020 12:04:01 -------------------------------------------------------------------------------- Problem List Details Patient Name: Date of Service: Sonya Chambers. 08/29/2020 10:30 A M Medical Record Number:  [id number] Patient Account Number: [id number] Date of Birth/Sex: Treating RN: 01-25-1941 (79 y.o. [name] Primary Care Provider: Nicholes Stairs Other Clinician: Referring Provider: Treating Provider/Extender: Nyra Jabs in Treatment: 0 Active Problems ICD-10 Encounter Code Description Active Date MDM Diagnosis 4045454666 Non-pressure chronic ulcer of other part of left lower leg limited to breakdown 08/29/2020 No Yes of skin I87.2 Venous insufficiency (chronic) (peripheral) 08/29/2020 No Yes Z92.241 Personal history of systemic steroid therapy 08/29/2020 No Yes Inactive Problems Resolved Problems Electronic Signature(s) Signed: 08/29/2020 5:08:08 PM By: Linton Ham MD Entered By: Linton Ham on 08/29/2020 12:36:23 -------------------------------------------------------------------------------- Progress Note Details Patient Name: Date of Service: Sonya Raider S. 08/29/2020 10:30 A M Medical Record Number: [id number] Patient Account Number: [id number] Date of Birth/Sex: Treating RN: [birth year] (79 y.o. [name] Primary Care Provider: Nicholes Stairs Other Clinician: Referring Provider: Treating Provider/Extender: Nyra Jabs in Treatment: 0 Subjective Chief Complaint Information obtained from Patient 08/29/2020; patient is here for review of wounds on the left anterior lower leg History of Present Illness (HPI) ADMISSION 08/29/2020 This is a 79 year old woman who has wounds on her left lower leg mid aspect. She states that these have been present since June when she was hospitalized with a UTI, delirium possibly medication issues. I do not see any mention of the wounds on her left leg at that time. In any case these develop sometime after this. The patient is followed by Dr. Jacelyn Grip at [place] at [facility]. She has been using bacitracin ointment to the wounds. She has [facility] home health although I am  not exactly sure what they are doing. Her husband is angry about a bill from home health again we were not able to help him exactly. The patient has chronic sarcoidosis and adrenal insufficiency she has been on longstanding prednisone. She has all the classic skin features of chronic steroid use. I think this is the primary issue for these wounds. They have also noted weeping fluid coming out of the wounds and even weeping draining fluid on the right leg although we were not able to define any wound on the right leg. She could very well have a component of chronic venous insufficiency as well. Past medical history includes sarcoidosis, type 2 diabetes with a recent hemoglobin A1c of 6.3, diabetic neuropathy, stage III P chronic renal failure, hypertension, adrenal insufficiency. ABI on the right noncompressible on the left at 1.02 Patient History Information obtained from Patient. Allergies metoclopramide, codeine (Reaction: itching, rash), menthol (Reaction: nausea/vomiting), Statins-Hmg-Coa Reductase Inhibitors, alendronate sodium, aspirin (Reaction: tinitis), fentanyl (Reaction: tinitis), fenofibrate, Fosamax, losartan, Miacalcin, nitrofurantoin,  NSAIDS (Non-Steroidal Anti-Inflammatory Drug), Prolia, Sulfa (Sulfonamide Antibiotics) (Reaction: nausea/vomiting), tolmetin, WelChol, Zetia, Lovaza Family History Cancer - Father,Maternal Grandparents, No family history of Diabetes, Heart Disease, Hereditary Spherocytosis, Hypertension, Kidney Disease, Lung Disease, Seizures, Stroke, Thyroid Problems, Tuberculosis. Social History Never smoker, Marital Status - Married, Alcohol Use - Rarely, Drug Use - No History, Caffeine Use - Daily - coffee. Medical History Eyes Patient has history of Cataracts - bil removed, Glaucoma Denies history of Optic Neuritis Ear/Nose/Mouth/Throat Denies history of Chronic sinus problems/congestion, Middle ear problems Cardiovascular Patient has history of  Hypertension Endocrine Patient has history of Type II Diabetes Denies history of Type I Diabetes Genitourinary Denies history of End Stage Renal Disease Musculoskeletal Patient has history of Osteoarthritis Neurologic Patient has history of Neuropathy Oncologic Denies history of Received Chemotherapy, Received Radiation Psychiatric Denies history of Anorexia/bulimia, Confinement Anxiety Patient is treated with Oral Agents. Blood sugar is tested. Hospitalization/Surgery History - ORIF left distal radius. - lumbarlaminectomy/decompression. - knee arthroscopy. - cholecystectomy. - partial hysterectomy. - breast biopsy. - cataract surgery. Medical A Surgical History Notes nd Constitutional Symptoms (General Health) vitamin d deficiency Ear/Nose/Mouth/Throat deaf right ear, partial deafness left ear Respiratory sarcoidosis Cardiovascular hyperlipidemia Gastrointestinal GERD Endocrine adrenal insufficiency Genitourinary CKD stage 3, hx kidney stones Review of Systems (ROS) Constitutional Symptoms (General Health) Denies complaints or symptoms of Fatigue, Fever, Chills, Marked Weight Change. Eyes Complains or has symptoms of Glasses / Contacts - reading. Ear/Nose/Mouth/Throat Denies complaints or symptoms of Chronic sinus problems or rhinitis. Respiratory Denies complaints or symptoms of Chronic or frequent coughs, Shortness of Breath. Cardiovascular Denies complaints or symptoms of Chest pain. Gastrointestinal Denies complaints or symptoms of Frequent diarrhea, Nausea, Vomiting. Genitourinary Denies complaints or symptoms of Frequent urination. Integumentary (Skin) Complains or has symptoms of Wounds - left lower leg. Musculoskeletal Denies complaints or symptoms of Muscle Pain, Muscle Weakness. Neurologic Complains or has symptoms of Numbness/parasthesias. Psychiatric Denies complaints or symptoms of Claustrophobia, Suicidal. Objective Constitutional Patient is  hypertensive.. Pulse regular and within target range for patient.Marland Kitchen Respirations regular, non-labored and within target range.. Temperature is normal and within the target range for the patient.Marland Kitchen Appears in no distress. Vitals Time Taken: 10:40 AM, Height: 66 in, Source: Stated, Weight: 141 lbs, Source: Stated, BMI: 22.8, Temperature: 98.2 F, Pulse: 97 bpm, Respiratory Rate: 18 breaths/min, Blood Pressure: 155/78 mmHg. Cardiovascular Pedal pulses are palpable on the left.. Skin changes of chronic steroid use. General Notes: Wound exam; the patient has 2 small circular ulcers in the lower tibial area 1 medially and 1 laterally. Both of these 100% covered with necrotic debris requiring debridement with a #3 curette. These cleaned up quite nicely. Notable the post debridement there is weeping edema fluid. Careful inspection of the skin on the right leg did not show any open wound in spite of the history of weeping edema fluid Integumentary (Hair, Skin) Wound #1 status is Open. Original cause of wound was Gradually Appeared. The wound is located on the Left,Anterior Lower Leg. The wound measures 1.5cm length x 1.2cm width x 0.1cm depth; 1.414cm^2 area and 0.141cm^3 volume. There is Fat Layer (Subcutaneous Tissue) exposed. There is no tunneling or undermining noted. There is a small amount of serous drainage noted. The wound margin is flat and intact. There is small (1-33%) pink granulation within the wound bed. There is a large (67-100%) amount of necrotic tissue within the wound bed including Adherent Slough. Wound #2 status is Open. Original cause of wound was Gradually Appeared. The wound is located on the  Left,Lateral Lower Leg. The wound measures 1.4cm length x 0.7cm width x 0.1cm depth; 0.77cm^2 area and 0.077cm^3 volume. There is Fat Layer (Subcutaneous Tissue) exposed. There is no tunneling or undermining noted. There is a small amount of serous drainage noted. The wound margin is flat and  intact. There is small (1-33%) pink granulation within the wound bed. There is a large (67-100%) amount of necrotic tissue within the wound bed including Adherent Slough. Assessment Active Problems ICD-10 Non-pressure chronic ulcer of other part of left lower leg limited to breakdown of skin Venous insufficiency (chronic) (peripheral) Personal history of systemic steroid therapy Procedures Wound #1 Pre-procedure diagnosis of Wound #1 is a Venous Leg Ulcer located on the Left,Anterior Lower Leg .Severity of Tissue Pre Debridement is: Fat layer exposed. There was a Excisional Skin/Subcutaneous Tissue Debridement with a total area of 1.8 sq cm performed by Ricard Dillon., MD. With the following instrument(s): Curette to remove Viable and Non-Viable tissue/material. Material removed includes Subcutaneous Tissue, Slough, Skin: Dermis, and Skin: Epidermis after achieving pain control using Lidocaine 5% topical ointment. No specimens were taken. A time out was conducted at 12:01, prior to the start of the procedure. A Minimum amount of bleeding was controlled with Pressure. The procedure was tolerated well with a pain level of 0 throughout and a pain level of 0 following the procedure. Post Debridement Measurements: 1.5cm length x 1.2cm width x 0.1cm depth; 0.141cm^3 volume. Character of Wound/Ulcer Post Debridement is improved. Severity of Tissue Post Debridement is: Fat layer exposed. Post procedure Diagnosis Wound #1: Same as Pre-Procedure Wound #2 Pre-procedure diagnosis of Wound #2 is a Diabetic Wound/Ulcer of the Lower Extremity located on the Left,Lateral Lower Leg .Severity of Tissue Pre Debridement is: Fat layer exposed. There was a Excisional Skin/Subcutaneous Tissue Debridement with a total area of 0.98 sq cm performed by Ricard Dillon., MD. With the following instrument(s): Curette to remove Viable and Non-Viable tissue/material. Material removed includes Subcutaneous  Tissue, Slough, Skin: Dermis, and Skin: Epidermis after achieving pain control using Lidocaine 5% topical ointment. No specimens were taken. A time out was conducted at 12:01, prior to the start of the procedure. A Minimum amount of bleeding was controlled with Pressure. The procedure was tolerated well with a pain level of 0 throughout and a pain level of 0 following the procedure. Post Debridement Measurements: 1.4cm length x 0.7cm width x 0.1cm depth; 0.077cm^3 volume. Character of Wound/Ulcer Post Debridement is improved. Severity of Tissue Post Debridement is: Fat layer exposed. Post procedure Diagnosis Wound #2: Same as Pre-Procedure Plan Follow-up Appointments: Return Appointment in 2 weeks. Dressing Change Frequency: Wound #1 Left,Anterior Lower Leg: Change dressing three times week. Wound #2 Left,Lateral Lower Leg: Change dressing three times week. Wound Cleansing: Wound #1 Left,Anterior Lower Leg: Clean wound with Normal Saline. Wound #2 Left,Lateral Lower Leg: Clean wound with Normal Saline. Primary Wound Dressing: Wound #1 Left,Anterior Lower Leg: Calcium Alginate with Silver Wound #2 Left,Lateral Lower Leg: Calcium Alginate with Silver Secondary Dressing: Wound #1 Left,Anterior Lower Leg: Dry Gauze ABD pad Wound #2 Left,Lateral Lower Leg: Dry Gauze ABD pad Edema Control: Wound #1 Left,Anterior Lower Leg: Kerlix and Coban - Left Lower Extremity Wound #2 Left,Lateral Lower Leg: Kerlix and Coban - Left Lower Extremity Home Health: Wound #1 Left,Anterior Lower Leg: [place] skilled nursing for wound care. [facility] Wound #2 Left,Lateral Lower Leg: [place] skilled nursing for wound care. - [facility] 1. I put silver alginate on both of these small  areas under kerlix Coban 2. The patient has [facility] home health to change the dressing 3. May need to change to something with better debridement properties if the surface of this wound  reaccumulates. 4. The weeping edema fluid in her legs makes me think that there is an element of chronic venous insufficiency here along with severe steroid skin damage. I spent 35 minutes in review of this patient's past medical history face-to-face evaluation and preparation of this record Electronic Signature(s) Signed: 08/29/2020 5:08:08 PM By: Linton Ham MD Entered By: Linton Ham on 08/29/2020 12:52:32 -------------------------------------------------------------------------------- HxROS Details Patient Name: Date of Service: Sonya Raider S. 08/29/2020 10:30 A M Medical Record Number: [id number] Patient Account Number: [id number] Date of Birth/Sex: Treating RN: 1941/08/27 (79 y.o. [name] Primary Care Provider: Nicholes Stairs Other Clinician: Referring Provider: Treating Provider/Extender: Nyra Jabs in Treatment: 0 Information Obtained From Patient Constitutional Symptoms (General Health) Complaints and Symptoms: Negative for: Fatigue; Fever; Chills; Marked Weight Change Medical History: Past Medical History Notes: vitamin d deficiency Eyes Complaints and Symptoms: Positive for: Glasses / Contacts - reading Medical History: Positive for: Cataracts - bil removed; Glaucoma Negative for: Optic Neuritis Ear/Nose/Mouth/Throat Complaints and Symptoms: Negative for: Chronic sinus problems or rhinitis Medical History: Negative for: Chronic sinus problems/congestion; Middle ear problems Past Medical History Notes: deaf right ear, partial deafness left ear Respiratory Complaints and Symptoms: Negative for: Chronic or frequent coughs; Shortness of Breath Medical History: Past Medical History Notes: sarcoidosis Cardiovascular Complaints and Symptoms: Negative for: Chest pain Medical History: Positive for: Hypertension Past Medical History Notes: hyperlipidemia Gastrointestinal Complaints and Symptoms: Negative for:  Frequent diarrhea; Nausea; Vomiting Medical History: Past Medical History Notes: GERD Genitourinary Complaints and Symptoms: Negative for: Frequent urination Medical History: Negative for: End Stage Renal Disease Past Medical History Notes: CKD stage 3, hx kidney stones Integumentary (Skin) Complaints and Symptoms: Positive for: Wounds - left lower leg Musculoskeletal Complaints and Symptoms: Negative for: Muscle Pain; Muscle Weakness Medical History: Positive for: Osteoarthritis Neurologic Complaints and Symptoms: Positive for: Numbness/parasthesias Medical History: Positive for: Neuropathy Psychiatric Complaints and Symptoms: Negative for: Claustrophobia; Suicidal Medical History: Negative for: Anorexia/bulimia; Confinement Anxiety Hematologic/Lymphatic Endocrine Medical History: Positive for: Type II Diabetes Negative for: Type I Diabetes Past Medical History Notes: adrenal insufficiency Time with diabetes: not sure Treated with: Oral agents Blood sugar tested every day: Yes Tested : once Immunological Oncologic Medical History: Negative for: Received Chemotherapy; Received Radiation HBO Extended History Items Eyes: Eyes: Cataracts Glaucoma Immunizations Pneumococcal Vaccine: Received Pneumococcal Vaccination: Yes Implantable Devices No devices added Hospitalization / Surgery History Type of Hospitalization/Surgery ORIF left distal radius lumbarlaminectomy/decompression knee arthroscopy cholecystectomy partial hysterectomy breast biopsy cataract surgery Family and Social History Cancer: Yes - Father,Maternal Grandparents; Diabetes: No; Heart Disease: No; Hereditary Spherocytosis: No; Hypertension: No; Kidney Disease: No; Lung Disease: No; Seizures: No; Stroke: No; Thyroid Problems: No; Tuberculosis: No; Never smoker; Marital Status - Married; Alcohol Use: Rarely; Drug Use: No History; Caffeine Use: Daily - coffee; Financial Concerns: No; Food,  Clothing or Shelter Needs: No; Support System Lacking: No; Transportation Concerns: No Electronic Signature(s) Signed: 08/29/2020 4:57:23 PM By: Baruch Gouty RN, BSN Signed: 08/29/2020 5:08:08 PM By: Linton Ham MD Entered By: Baruch Gouty on 08/29/2020 11:44:27 -------------------------------------------------------------------------------- SuperBill Details Patient Name: Date of Service: Sonya Chambers 08/29/2020 Medical Record Number: [id number] Patient Account Number: [id number] Date of Birth/Sex: Treating RN: October 16, 1941 (79 y.o. [name] Primary Care Provider: Nicholes Stairs Other Clinician: Referring Provider: Treating  Provider/Extender: Nyra Jabs in Treatment: 0 Diagnosis Coding ICD-10 Codes Code Description [id number] Non-pressure chronic ulcer of other part of left lower leg limited to breakdown of skin I87.2 Venous insufficiency (chronic) (peripheral) Z92.241 Personal history of systemic steroid therapy Facility Procedures CPT4 Code: [id number] Description: 99213 - WOUND CARE VISIT-LEV 3 EST PT Modifier: 25 Quantity: 1 Physician Procedures : CPT4 Code Description Modifier [id number] WC PHYS LEVEL 3 NEW PT 25 ICD-10 Diagnosis Description L97.821 Non-pressure chronic ulcer of other part of left lower leg limited to breakdown of skin I87.2 Venous insufficiency (chronic) (peripheral) Z92.241  Personal history of systemic steroid therapy Quantity: 1 : [id number] 11042 - WC PHYS SUBQ TISS 20 SQ CM ICD-10 Diagnosis Description L97.821 Non-pressure chronic ulcer of other part of left lower leg limited to breakdown of skin Quantity: 1 Electronic Signature(s) Signed: 08/29/2020 5:08:08 PM By: Linton Ham MD Signed: 08/30/2020 10:37:46 AM By: Carlene Coria RN Entered By: Carlene Coria on 08/29/2020 14:06:56

## 2020-08-31 DIAGNOSIS — G2581 Restless legs syndrome: Secondary | ICD-10-CM | POA: Diagnosis not present

## 2020-08-31 DIAGNOSIS — E1122 Type 2 diabetes mellitus with diabetic chronic kidney disease: Secondary | ICD-10-CM | POA: Diagnosis not present

## 2020-08-31 DIAGNOSIS — E1143 Type 2 diabetes mellitus with diabetic autonomic (poly)neuropathy: Secondary | ICD-10-CM | POA: Diagnosis not present

## 2020-08-31 DIAGNOSIS — L89891 Pressure ulcer of other site, stage 1: Secondary | ICD-10-CM | POA: Diagnosis not present

## 2020-08-31 DIAGNOSIS — E1142 Type 2 diabetes mellitus with diabetic polyneuropathy: Secondary | ICD-10-CM | POA: Diagnosis not present

## 2020-08-31 DIAGNOSIS — M1710 Unilateral primary osteoarthritis, unspecified knee: Secondary | ICD-10-CM | POA: Diagnosis not present

## 2020-08-31 DIAGNOSIS — D869 Sarcoidosis, unspecified: Secondary | ICD-10-CM | POA: Diagnosis not present

## 2020-08-31 DIAGNOSIS — E2740 Unspecified adrenocortical insufficiency: Secondary | ICD-10-CM | POA: Diagnosis not present

## 2020-08-31 DIAGNOSIS — N1831 Chronic kidney disease, stage 3a: Secondary | ICD-10-CM | POA: Diagnosis not present

## 2020-08-31 DIAGNOSIS — I129 Hypertensive chronic kidney disease with stage 1 through stage 4 chronic kidney disease, or unspecified chronic kidney disease: Secondary | ICD-10-CM | POA: Diagnosis not present

## 2020-09-05 DIAGNOSIS — E1142 Type 2 diabetes mellitus with diabetic polyneuropathy: Secondary | ICD-10-CM | POA: Diagnosis not present

## 2020-09-05 DIAGNOSIS — E1122 Type 2 diabetes mellitus with diabetic chronic kidney disease: Secondary | ICD-10-CM | POA: Diagnosis not present

## 2020-09-05 DIAGNOSIS — I129 Hypertensive chronic kidney disease with stage 1 through stage 4 chronic kidney disease, or unspecified chronic kidney disease: Secondary | ICD-10-CM | POA: Diagnosis not present

## 2020-09-05 DIAGNOSIS — G2581 Restless legs syndrome: Secondary | ICD-10-CM | POA: Diagnosis not present

## 2020-09-05 DIAGNOSIS — E2740 Unspecified adrenocortical insufficiency: Secondary | ICD-10-CM | POA: Diagnosis not present

## 2020-09-05 DIAGNOSIS — M1710 Unilateral primary osteoarthritis, unspecified knee: Secondary | ICD-10-CM | POA: Diagnosis not present

## 2020-09-05 DIAGNOSIS — L89891 Pressure ulcer of other site, stage 1: Secondary | ICD-10-CM | POA: Diagnosis not present

## 2020-09-05 DIAGNOSIS — D869 Sarcoidosis, unspecified: Secondary | ICD-10-CM | POA: Diagnosis not present

## 2020-09-05 DIAGNOSIS — N1831 Chronic kidney disease, stage 3a: Secondary | ICD-10-CM | POA: Diagnosis not present

## 2020-09-05 DIAGNOSIS — E1143 Type 2 diabetes mellitus with diabetic autonomic (poly)neuropathy: Secondary | ICD-10-CM | POA: Diagnosis not present

## 2020-09-07 DIAGNOSIS — L89891 Pressure ulcer of other site, stage 1: Secondary | ICD-10-CM | POA: Diagnosis not present

## 2020-09-07 DIAGNOSIS — M1710 Unilateral primary osteoarthritis, unspecified knee: Secondary | ICD-10-CM | POA: Diagnosis not present

## 2020-09-07 DIAGNOSIS — G2581 Restless legs syndrome: Secondary | ICD-10-CM | POA: Diagnosis not present

## 2020-09-07 DIAGNOSIS — E1122 Type 2 diabetes mellitus with diabetic chronic kidney disease: Secondary | ICD-10-CM | POA: Diagnosis not present

## 2020-09-07 DIAGNOSIS — E1142 Type 2 diabetes mellitus with diabetic polyneuropathy: Secondary | ICD-10-CM | POA: Diagnosis not present

## 2020-09-07 DIAGNOSIS — I129 Hypertensive chronic kidney disease with stage 1 through stage 4 chronic kidney disease, or unspecified chronic kidney disease: Secondary | ICD-10-CM | POA: Diagnosis not present

## 2020-09-07 DIAGNOSIS — E1143 Type 2 diabetes mellitus with diabetic autonomic (poly)neuropathy: Secondary | ICD-10-CM | POA: Diagnosis not present

## 2020-09-07 DIAGNOSIS — E2740 Unspecified adrenocortical insufficiency: Secondary | ICD-10-CM | POA: Diagnosis not present

## 2020-09-07 DIAGNOSIS — N1831 Chronic kidney disease, stage 3a: Secondary | ICD-10-CM | POA: Diagnosis not present

## 2020-09-07 DIAGNOSIS — D869 Sarcoidosis, unspecified: Secondary | ICD-10-CM | POA: Diagnosis not present

## 2020-09-11 DIAGNOSIS — G2581 Restless legs syndrome: Secondary | ICD-10-CM | POA: Diagnosis not present

## 2020-09-11 DIAGNOSIS — L89891 Pressure ulcer of other site, stage 1: Secondary | ICD-10-CM | POA: Diagnosis not present

## 2020-09-11 DIAGNOSIS — M1710 Unilateral primary osteoarthritis, unspecified knee: Secondary | ICD-10-CM | POA: Diagnosis not present

## 2020-09-11 DIAGNOSIS — E2740 Unspecified adrenocortical insufficiency: Secondary | ICD-10-CM | POA: Diagnosis not present

## 2020-09-11 DIAGNOSIS — D869 Sarcoidosis, unspecified: Secondary | ICD-10-CM | POA: Diagnosis not present

## 2020-09-11 DIAGNOSIS — I129 Hypertensive chronic kidney disease with stage 1 through stage 4 chronic kidney disease, or unspecified chronic kidney disease: Secondary | ICD-10-CM | POA: Diagnosis not present

## 2020-09-11 DIAGNOSIS — E1143 Type 2 diabetes mellitus with diabetic autonomic (poly)neuropathy: Secondary | ICD-10-CM | POA: Diagnosis not present

## 2020-09-11 DIAGNOSIS — E1122 Type 2 diabetes mellitus with diabetic chronic kidney disease: Secondary | ICD-10-CM | POA: Diagnosis not present

## 2020-09-11 DIAGNOSIS — E1142 Type 2 diabetes mellitus with diabetic polyneuropathy: Secondary | ICD-10-CM | POA: Diagnosis not present

## 2020-09-11 DIAGNOSIS — N1831 Chronic kidney disease, stage 3a: Secondary | ICD-10-CM | POA: Diagnosis not present

## 2020-09-12 ENCOUNTER — Encounter (HOSPITAL_BASED_OUTPATIENT_CLINIC_OR_DEPARTMENT_OTHER): Payer: Medicare HMO | Attending: Internal Medicine | Admitting: Internal Medicine

## 2020-09-12 ENCOUNTER — Other Ambulatory Visit: Payer: Self-pay

## 2020-09-12 DIAGNOSIS — I129 Hypertensive chronic kidney disease with stage 1 through stage 4 chronic kidney disease, or unspecified chronic kidney disease: Secondary | ICD-10-CM | POA: Insufficient documentation

## 2020-09-12 DIAGNOSIS — Z92241 Personal history of systemic steroid therapy: Secondary | ICD-10-CM | POA: Insufficient documentation

## 2020-09-12 DIAGNOSIS — D869 Sarcoidosis, unspecified: Secondary | ICD-10-CM | POA: Insufficient documentation

## 2020-09-12 DIAGNOSIS — L97822 Non-pressure chronic ulcer of other part of left lower leg with fat layer exposed: Secondary | ICD-10-CM | POA: Diagnosis not present

## 2020-09-12 DIAGNOSIS — I872 Venous insufficiency (chronic) (peripheral): Secondary | ICD-10-CM | POA: Insufficient documentation

## 2020-09-12 DIAGNOSIS — E1140 Type 2 diabetes mellitus with diabetic neuropathy, unspecified: Secondary | ICD-10-CM | POA: Insufficient documentation

## 2020-09-12 DIAGNOSIS — L97821 Non-pressure chronic ulcer of other part of left lower leg limited to breakdown of skin: Secondary | ICD-10-CM | POA: Diagnosis not present

## 2020-09-12 DIAGNOSIS — E2740 Unspecified adrenocortical insufficiency: Secondary | ICD-10-CM | POA: Insufficient documentation

## 2020-09-12 DIAGNOSIS — E1122 Type 2 diabetes mellitus with diabetic chronic kidney disease: Secondary | ICD-10-CM | POA: Diagnosis not present

## 2020-09-12 DIAGNOSIS — N183 Chronic kidney disease, stage 3 unspecified: Secondary | ICD-10-CM | POA: Insufficient documentation

## 2020-09-12 DIAGNOSIS — E11622 Type 2 diabetes mellitus with other skin ulcer: Secondary | ICD-10-CM | POA: Diagnosis not present

## 2020-09-15 DIAGNOSIS — I129 Hypertensive chronic kidney disease with stage 1 through stage 4 chronic kidney disease, or unspecified chronic kidney disease: Secondary | ICD-10-CM | POA: Diagnosis not present

## 2020-09-15 DIAGNOSIS — E1122 Type 2 diabetes mellitus with diabetic chronic kidney disease: Secondary | ICD-10-CM | POA: Diagnosis not present

## 2020-09-15 DIAGNOSIS — D869 Sarcoidosis, unspecified: Secondary | ICD-10-CM | POA: Diagnosis not present

## 2020-09-15 DIAGNOSIS — G2581 Restless legs syndrome: Secondary | ICD-10-CM | POA: Diagnosis not present

## 2020-09-15 DIAGNOSIS — M1710 Unilateral primary osteoarthritis, unspecified knee: Secondary | ICD-10-CM | POA: Diagnosis not present

## 2020-09-15 DIAGNOSIS — E1142 Type 2 diabetes mellitus with diabetic polyneuropathy: Secondary | ICD-10-CM | POA: Diagnosis not present

## 2020-09-15 DIAGNOSIS — L89891 Pressure ulcer of other site, stage 1: Secondary | ICD-10-CM | POA: Diagnosis not present

## 2020-09-15 DIAGNOSIS — E1143 Type 2 diabetes mellitus with diabetic autonomic (poly)neuropathy: Secondary | ICD-10-CM | POA: Diagnosis not present

## 2020-09-15 DIAGNOSIS — S81812A Laceration without foreign body, left lower leg, initial encounter: Secondary | ICD-10-CM | POA: Diagnosis not present

## 2020-09-15 DIAGNOSIS — N1831 Chronic kidney disease, stage 3a: Secondary | ICD-10-CM | POA: Diagnosis not present

## 2020-09-15 DIAGNOSIS — E2740 Unspecified adrenocortical insufficiency: Secondary | ICD-10-CM | POA: Diagnosis not present

## 2020-09-18 DIAGNOSIS — E1122 Type 2 diabetes mellitus with diabetic chronic kidney disease: Secondary | ICD-10-CM | POA: Diagnosis not present

## 2020-09-18 DIAGNOSIS — E1143 Type 2 diabetes mellitus with diabetic autonomic (poly)neuropathy: Secondary | ICD-10-CM | POA: Diagnosis not present

## 2020-09-18 DIAGNOSIS — N1831 Chronic kidney disease, stage 3a: Secondary | ICD-10-CM | POA: Diagnosis not present

## 2020-09-18 DIAGNOSIS — D869 Sarcoidosis, unspecified: Secondary | ICD-10-CM | POA: Diagnosis not present

## 2020-09-18 DIAGNOSIS — E2740 Unspecified adrenocortical insufficiency: Secondary | ICD-10-CM | POA: Diagnosis not present

## 2020-09-18 DIAGNOSIS — G2581 Restless legs syndrome: Secondary | ICD-10-CM | POA: Diagnosis not present

## 2020-09-18 DIAGNOSIS — M1710 Unilateral primary osteoarthritis, unspecified knee: Secondary | ICD-10-CM | POA: Diagnosis not present

## 2020-09-18 DIAGNOSIS — I129 Hypertensive chronic kidney disease with stage 1 through stage 4 chronic kidney disease, or unspecified chronic kidney disease: Secondary | ICD-10-CM | POA: Diagnosis not present

## 2020-09-18 DIAGNOSIS — E1142 Type 2 diabetes mellitus with diabetic polyneuropathy: Secondary | ICD-10-CM | POA: Diagnosis not present

## 2020-09-18 DIAGNOSIS — L89891 Pressure ulcer of other site, stage 1: Secondary | ICD-10-CM | POA: Diagnosis not present

## 2020-09-18 NOTE — Progress Notes (Signed)
Sonya Goodwin, Sonya Goodwin ([id number]) Visit Report for 09/12/2020 Debridement Details Patient Name: Date of Service: [place], [name] 09/12/2020 12:30 PM Medical Record Number: [id number] Patient Account Number: [id number] Date of Birth/Sex: Treating RN: May 10, 1941 (79 y.o. [name] Primary Care Provider: Nicholes Stairs Other Clinician: Referring Provider: Treating Provider/Extender: Nyra Jabs in Treatment: 2 Debridement Performed for Assessment: Wound #1 Left,Anterior Lower Leg Performed By: Physician Ricard Dillon., MD Debridement Type: Debridement Severity of Tissue Pre Debridement: Fat layer exposed Level of Consciousness (Pre-procedure): Awake and Alert Pre-procedure Verification/Time Out Yes - 13:13 Taken: Start Time: 13:13 Pain Control: Lidocaine 5% topical ointment T Area Debrided (L x W): otal 1.4 (cm) x 1.1 (cm) = 1.54 (cm) Tissue and other material debrided: Viable, Non-Viable, Slough, Subcutaneous, Skin: Dermis , Skin: Epidermis, Slough Level: Skin/Subcutaneous Tissue Debridement Description: Excisional Instrument: Curette Bleeding: Moderate Hemostasis Achieved: Pressure End Time: 13:16 Procedural Pain: 3 Post Procedural Pain: 0 Response to Treatment: Procedure was tolerated well Level of Consciousness (Post- Awake and Alert procedure): Post Debridement Measurements of Total Wound Length: (cm) 1.4 Width: (cm) 1.1 Depth: (cm) 0.1 Volume: (cm) 0.121 Character of Wound/Ulcer Post Debridement: Improved Severity of Tissue Post Debridement: Fat layer exposed Post Procedure Diagnosis Same as Pre-procedure Electronic Signature(s) Signed: 09/12/2020 5:12:45 PM By: Linton Ham MD Signed: 09/18/2020 1:15:48 PM By: Carlene Coria RN Entered By: Linton Ham on 09/12/2020 13:44:09 -------------------------------------------------------------------------------- Debridement Details Patient Name: Date of Service: Sonya Goodwin. 09/12/2020 12:30 PM Medical Record Number: [id number] Patient Account Number: [id number] Date of Birth/Sex: Treating RN: 31-May-1941 (79 y.o. [name] Primary Care Provider: Nicholes Stairs Other Clinician: Referring Provider: Treating Provider/Extender: Nyra Jabs in Treatment: 2 Debridement Performed for Assessment: Wound #2 Left,Lateral Lower Leg Performed By: Physician Ricard Dillon., MD Debridement Type: Debridement Severity of Tissue Pre Debridement: Fat layer exposed Level of Consciousness (Pre-procedure): Awake and Alert Pre-procedure Verification/Time Out Yes - 13:13 Taken: Start Time: 13:13 Pain Control: Lidocaine 5% topical ointment T Area Debrided (L x W): otal 1.4 (cm) x 0.9 (cm) = 1.26 (cm) Tissue and other material debrided: Viable, Non-Viable, Slough, Subcutaneous, Skin: Dermis , Skin: Epidermis, Slough Level: Skin/Subcutaneous Tissue Debridement Description: Excisional Instrument: Curette Bleeding: Moderate Hemostasis Achieved: Pressure End Time: 13:16 Procedural Pain: 3 Post Procedural Pain: 0 Response to Treatment: Procedure was tolerated well Level of Consciousness (Post- Awake and Alert procedure): Post Debridement Measurements of Total Wound Length: (cm) 1.4 Width: (cm) 0.9 Depth: (cm) 0.1 Volume: (cm) 0.099 Character of Wound/Ulcer Post Debridement: Improved Severity of Tissue Post Debridement: Fat layer exposed Post Procedure Diagnosis Same as Pre-procedure Electronic Signature(s) Signed: 09/12/2020 5:12:45 PM By: Linton Ham MD Signed: 09/18/2020 1:15:48 PM By: Carlene Coria RN Entered By: Linton Ham on 09/12/2020 13:44:18 -------------------------------------------------------------------------------- HPI Details Patient Name: Date of Service: Sonya Raider S. 09/12/2020 12:30 PM Medical Record Number: [id number] Patient Account Number: [id number] Date of Birth/Sex: Treating  RN: 1941-12-30 (79 y.o. [name] Primary Care Provider: Nicholes Stairs Other Clinician: Referring Provider: Treating Provider/Extender: Nyra Jabs in Treatment: 2 History of Present Illness HPI Description: ADMISSION 08/29/2020 This is a 79 year old woman who has wounds on her left lower leg mid aspect. She states that these have been present since June when she was hospitalized with a UTI, delirium possibly medication issues. I do not see any mention of the wounds on her left leg at that time. In any case these develop  sometime after this. The patient is followed by Dr. Jacelyn Grip at [place] at [facility]. She has been using bacitracin ointment to the wounds. She has [facility] home health although I am not exactly sure what they are doing. Her husband is angry about a bill from home health again we were not able to help him exactly. The patient has chronic sarcoidosis and adrenal insufficiency she has been on longstanding prednisone. She has all the classic skin features of chronic steroid use. I think this is the primary issue for these wounds. They have also noted weeping fluid coming out of the wounds and even weeping draining fluid on the right leg although we were not able to define any wound on the right leg. She could very well have a component of chronic venous insufficiency as well. Past medical history includes sarcoidosis, type 2 diabetes with a recent hemoglobin A1c of 6.3, diabetic neuropathy, stage III P chronic renal failure, hypertension, adrenal insufficiency. ABI on the right noncompressible on the left at 1.02 9/14 2 small punched out areas of the left anterior mid tibia. We have been using Iodoflex. Better looking wound surfaces Electronic Signature(s) Signed: 09/12/2020 5:12:45 PM By: Linton Ham MD Entered By: Linton Ham on 09/12/2020 13:45:26 -------------------------------------------------------------------------------- Physical  Exam Details Patient Name: Date of Service: Sonya Raider S. 09/12/2020 12:30 PM Medical Record Number: [id number] Patient Account Number: [id number] Date of Birth/Sex: Treating RN: 02/20/1941 (79 y.o. [name] Primary Care Provider: Nicholes Stairs Other Clinician: Referring Provider: Treating Provider/Extender: Nyra Jabs in Treatment: 2 Cardiovascular Dorsalis pedis pulses are palpable bilaterally. Notes Wound exam; still the 2 small circular punched-out areas in the mid lower tibia one medial and 1 lateral to the tibia. I used a #3 curette to remove the remaining debris. Better looking wound surfaces minor bleeding. No evidence of surrounding infection Electronic Signature(s) Signed: 09/12/2020 5:12:45 PM By: Linton Ham MD Entered By: Linton Ham on 09/12/2020 13:46:14 -------------------------------------------------------------------------------- Physician Orders Details Patient Name: Date of Service: Sonya Raider S. 09/12/2020 12:30 PM Medical Record Number: [id number] Patient Account Number: [id number] Date of Birth/Sex: Treating RN: 1941-07-27 (79 y.o. [name] Primary Care Provider: Nicholes Stairs Other Clinician: Referring Provider: Treating Provider/Extender: Nyra Jabs in Treatment: 2 Verbal / Phone Orders: No Diagnosis Coding ICD-10 Coding Code Description [phone] Non-pressure chronic ulcer of other part of left lower leg limited to breakdown of skin I87.2 Venous insufficiency (chronic) (peripheral) Z92.241 Personal history of systemic steroid therapy Follow-up Appointments Return Appointment in 2 weeks. Dressing Change Frequency Wound #1 Left,Anterior Lower Leg Change dressing three times week. Wound #2 Left,Lateral Lower Leg Change dressing three times week. Wound Cleansing Wound #1 Left,Anterior Lower Leg Clean wound with Normal Saline. Wound #2 Left,Lateral Lower  Leg Clean wound with Normal Saline. Primary Wound Dressing Wound #1 Left,Anterior Lower Leg Iodoflex Wound #2 Left,Lateral Lower Leg Iodoflex Secondary Dressing Wound #1 Left,Anterior Lower Leg Dry Gauze ABD pad Wound #2 Left,Lateral Lower Leg Dry Gauze ABD pad Edema Control Wound #1 Left,Anterior Lower Leg Kerlix and Coban - Left Lower Extremity Wound #2 Left,Lateral Lower Leg Kerlix and Coban - Left Lower Extremity Home Health Wound #1 Left,Anterior Lower Leg Continue Home Health skilled nursing for wound care. [facility] [place] Wound #2 [place] skilled nursing for wound care. Jack[facility] [place] Electronic Signature(s) Signed: 09/12/2020 5:12:45 PM By: Linton Ham MD Signed: 09/18/2020 1:15:48 PM By: Carlene Coria RN Entered By: Dolores Lory  Carrie on 09/12/2020 13:15:41 -------------------------------------------------------------------------------- Problem List Details Patient Name: Date of Service: [place], [name] 09/12/2020 12:30 PM Medical Record Number: [id number] Patient Account Number: [id number] Date of Birth/Sex: Treating RN: [birth year] (79 y.o. [name] Primary Care Provider: Nicholes Stairs Other Clinician: Referring Provider: Treating Provider/Extender: Nyra Jabs in Treatment: 2 Active Problems ICD-10 Encounter Code Description Active Date MDM Diagnosis [phone] Non-pressure chronic ulcer of other part of left lower leg limited to breakdown 08/29/2020 No Yes of skin I87.2 Venous insufficiency (chronic) (peripheral) 08/29/2020 No Yes Z92.241 Personal history of systemic steroid therapy 08/29/2020 No Yes Inactive Problems Resolved Problems Electronic Signature(s) Signed: 09/12/2020 5:12:45 PM By: Linton Ham MD Entered By: Linton Ham on 09/12/2020 13:43:41 -------------------------------------------------------------------------------- Progress Note Details Patient Name: Date of  Service: Sonya Raider S. 09/12/2020 12:30 PM Medical Record Number: [id number] Patient Account Number: [id number] Date of Birth/Sex: Treating RN: [birth year] (79 y.o. [name] Primary Care Provider: Nicholes Stairs Other Clinician: Referring Provider: Treating Provider/Extender: Nyra Jabs in Treatment: 2 Subjective History of Present Illness (HPI) ADMISSION 08/29/2020 This is a 79 year old woman who has wounds on her left lower leg mid aspect. She states that these have been present since June when she was hospitalized with a UTI, delirium possibly medication issues. I do not see any mention of the wounds on her left leg at that time. In any case these develop sometime after this. The patient is followed by Dr. Jacelyn Grip at [place] at [facility]. She has been using bacitracin ointment to the wounds. She has [facility] home health although I am not exactly sure what they are doing. Her husband is angry about a bill from home health again we were not able to help him exactly. The patient has chronic sarcoidosis and adrenal insufficiency she has been on longstanding prednisone. She has all the classic skin features of chronic steroid use. I think this is the primary issue for these wounds. They have also noted weeping fluid coming out of the wounds and even weeping draining fluid on the right leg although we were not able to define any wound on the right leg. She could very well have a component of chronic venous insufficiency as well. Past medical history includes sarcoidosis, type 2 diabetes with a recent hemoglobin A1c of 6.3, diabetic neuropathy, stage III P chronic renal failure, hypertension, adrenal insufficiency. ABI on the right noncompressible on the left at 1.02 9/14 2 small punched out areas of the left anterior mid tibia. We have been using Iodoflex. Better looking wound surfaces Objective Constitutional Vitals Time Taken: 12:46 PM, Height: 66 in,  Source: Stated, Weight: 141 lbs, Source: Stated, BMI: 22.8, Temperature: 98.3 F, Pulse: 101 bpm, Respiratory Rate: 18 breaths/min, Blood Pressure: 143/70 mmHg. Cardiovascular Dorsalis pedis pulses are palpable bilaterally. General Notes: Wound exam; still the 2 small circular punched-out areas in the mid lower tibia one medial and 1 lateral to the tibia. I used a #3 curette to remove the remaining debris. Better looking wound surfaces minor bleeding. No evidence of surrounding infection Integumentary (Hair, Skin) Wound #1 status is Open. Original cause of wound was Gradually Appeared. The wound is located on the Left,Anterior Lower Leg. The wound measures 1.4cm length x 1.1cm width x 0.1cm depth; 1.21cm^2 area and 0.121cm^3 volume. There is Fat Layer (Subcutaneous Tissue) exposed. There is no tunneling or undermining noted. There is a medium amount of serous drainage noted. The wound margin is flat and  intact. There is small (1-33%) pink granulation within the wound bed. There is a large (67-100%) amount of necrotic tissue within the wound bed including Adherent Slough. Wound #2 status is Open. Original cause of wound was Gradually Appeared. The wound is located on the Left,Lateral Lower Leg. The wound measures 1.4cm length x 0.9cm width x 0.1cm depth; 0.99cm^2 area and 0.099cm^3 volume. There is Fat Layer (Subcutaneous Tissue) exposed. There is no tunneling noted, however, there is undermining starting at 12:00 and ending at 12:00 with a maximum distance of 2.7cm. There is a medium amount of serous drainage noted. The wound margin is well defined and not attached to the wound base. There is small (1-33%) pink granulation within the wound bed. There is a large (67-100%) amount of necrotic tissue within the wound bed including Adherent Slough. Assessment Active Problems ICD-10 Non-pressure chronic ulcer of other part of left lower leg limited to breakdown of skin Venous insufficiency (chronic)  (peripheral) Personal history of systemic steroid therapy Procedures Wound #1 Pre-procedure diagnosis of Wound #1 is a Venous Leg Ulcer located on the Left,Anterior Lower Leg .Severity of Tissue Pre Debridement is: Fat layer exposed. There was a Excisional Skin/Subcutaneous Tissue Debridement with a total area of 1.54 sq cm performed by Ricard Dillon., MD. With the following instrument(s): Curette to remove Viable and Non-Viable tissue/material. Material removed includes Subcutaneous Tissue, Slough, Skin: Dermis, and Skin: Epidermis after achieving pain control using Lidocaine 5% topical ointment. No specimens were taken. A time out was conducted at 13:13, prior to the start of the procedure. A Moderate amount of bleeding was controlled with Pressure. The procedure was tolerated well with a pain level of 3 throughout and a pain level of 0 following the procedure. Post Debridement Measurements: 1.4cm length x 1.1cm width x 0.1cm depth; 0.121cm^3 volume. Character of Wound/Ulcer Post Debridement is improved. Severity of Tissue Post Debridement is: Fat layer exposed. Post procedure Diagnosis Wound #1: Same as Pre-Procedure Wound #2 Pre-procedure diagnosis of Wound #2 is a Diabetic Wound/Ulcer of the Lower Extremity located on the Left,Lateral Lower Leg .Severity of Tissue Pre Debridement is: Fat layer exposed. There was a Excisional Skin/Subcutaneous Tissue Debridement with a total area of 1.26 sq cm performed by Ricard Dillon., MD. With the following instrument(s): Curette to remove Viable and Non-Viable tissue/material. Material removed includes Subcutaneous Tissue, Slough, Skin: Dermis, and Skin: Epidermis after achieving pain control using Lidocaine 5% topical ointment. No specimens were taken. A time out was conducted at 13:13, prior to the start of the procedure. A Moderate amount of bleeding was controlled with Pressure. The procedure was tolerated well with a pain level of 3 throughout  and a pain level of 0 following the procedure. Post Debridement Measurements: 1.4cm length x 0.9cm width x 0.1cm depth; 0.099cm^3 volume. Character of Wound/Ulcer Post Debridement is improved. Severity of Tissue Post Debridement is: Fat layer exposed. Post procedure Diagnosis Wound #2: Same as Pre-Procedure Plan Follow-up Appointments: Return Appointment in 2 weeks. Dressing Change Frequency: Wound #1 Left,Anterior Lower Leg: Change dressing three times week. Wound #2 Left,Lateral Lower Leg: Change dressing three times week. Wound Cleansing: Wound #1 Left,Anterior Lower Leg: Clean wound with Normal Saline. Wound #2 Left,Lateral Lower Leg: Clean wound with Normal Saline. Primary Wound Dressing: Wound #1 Left,Anterior Lower Leg: Iodoflex Wound #2 Left,Lateral Lower Leg: Iodoflex Secondary Dressing: Wound #1 Left,Anterior Lower Leg: Dry Gauze ABD pad Wound #2 Left,Lateral Lower Leg: Dry Gauze ABD pad Edema Control: Wound #1 Left,Anterior Lower Leg: Kerlix  and Coban - Left Lower Extremity Wound #2 Left,Lateral Lower Leg: Kerlix and Coban - Left Lower Extremity Home Health: Wound #1 Left,Anterior Lower Leg: [place] skilled nursing for wound care. [facility] Wound #2 Left,Lateral Lower Leg: [place] skilled nursing for wound care. - [facility] 1. Debridement today. Using Iodoflex surface is better and I am hopeful that we will not have to do any further debridements 2. The Iodoflex seems to have really helped with ongoing debridement between the visits here and I am going to continue this for now 3. If the wound surface is clean the next time we see her consider endoform and/or putting her in for an advanced treatment product. Electronic Signature(s) Signed: 09/12/2020 5:12:45 PM By: Linton Ham MD Entered By: Linton Ham on 09/12/2020 13:47:20 -------------------------------------------------------------------------------- SuperBill  Details Patient Name: Date of Service: Sonya Goodwin 09/12/2020 Medical Record Number: [id number] Patient Account Number: [id number] Date of Birth/Sex: Treating RN: 1941-12-19 (79 y.o. [name] Primary Care Provider: Nicholes Stairs Other Clinician: Referring Provider: Treating Provider/Extender: Nyra Jabs in Treatment: 2 Diagnosis Coding ICD-10 Codes Code Description [phone] Non-pressure chronic ulcer of other part of left lower leg limited to breakdown of skin I87.2 Venous insufficiency (chronic) (peripheral) Z92.241 Personal history of systemic steroid therapy Facility Procedures CPT4 Code: [id number] Description: 22025 - DEB SUBQ TISSUE 20 SQ CM/< ICD-10 Diagnosis Description L97.821 Non-pressure chronic ulcer of other part of left lower leg limited to breakdown Modifier: of skin Quantity: 1 Physician Procedures : CPT4 Code Description Modifier [id number] 11042 - WC PHYS SUBQ TISS 20 SQ CM ICD-10 Diagnosis Description L97.821 Non-pressure chronic ulcer of other part of left lower leg limited to breakdown of skin Quantity: 1 Electronic Signature(s) Signed: 09/12/2020 5:12:45 PM By: Linton Ham MD Entered By: Linton Ham on 09/12/2020 13:47:35

## 2020-09-18 NOTE — Progress Notes (Signed)
AWA, BACHICHA ([id number]) Visit Report for 09/12/2020 Arrival Information Details Patient Name: Date of Service: [place], [name] 09/12/2020 12:30 PM Medical Record Number: [id number] Patient Account Number: [id number] Date of Birth/Sex: Treating RN: 1941/03/16 (79 y.o. Martyn Malay, Linda Primary Care Emmalene Kattner: Nicholes Stairs Other Clinician: Referring Avyon Herendeen: Treating Marvena Tally/Extender: Nyra Jabs in Treatment: 2 Visit Information History Since Last Visit Added or deleted any medications: No Patient Arrived: Wheel Chair Any new allergies or adverse reactions: No Arrival Time: 12:40 Had a fall or experienced change in No Accompanied By: spouse activities of daily living that may affect Transfer Assistance: None risk of falls: Patient Identification Verified: Yes Signs or symptoms of abuse/neglect since last visito No Secondary Verification Process Completed: Yes Hospitalized since last visit: No Patient Requires Transmission-Based Precautions: No Implantable device outside of the clinic excluding No Patient Has Alerts: No cellular tissue based products placed in the center since last visit: Has Dressing in Place as Prescribed: Yes Has Compression in Place as Prescribed: Yes Pain Present Now: Yes Electronic Signature(s) Signed: 09/12/2020 5:02:37 PM By: Baruch Gouty RN, BSN Entered By: Baruch Gouty on 09/12/2020 12:46:09 -------------------------------------------------------------------------------- Encounter Discharge Information Details Patient Name: Date of Service: [name] 09/12/2020 12:30 PM Medical Record Number: [id number] Patient Account Number: [id number] Date of Birth/Sex: Treating RN: 05-15-1941 (79 y.o. Clearnce Sorrel Primary Care Lc Joynt: Nicholes Stairs Other Clinician: Referring Gennaro Lizotte: Treating Andre Swander/Extender: Nyra Jabs in Treatment: 2 Encounter Discharge  Information Items Post Procedure Vitals Discharge Condition: Stable Temperature (F): 98.3 Ambulatory Status: Wheelchair Pulse (bpm): 101 Discharge Destination: Home Respiratory Rate (breaths/min): 18 Transportation: Private Auto Blood Pressure (mmHg): 143/70 Accompanied By: wife Schedule Follow-up Appointment: Yes Clinical Summary of Care: Patient Declined Electronic Signature(s) Signed: 09/14/2020 4:34:39 PM By: Kela Millin Entered By: Kela Millin on 09/12/2020 13:45:19 -------------------------------------------------------------------------------- Lower Extremity Assessment Details Patient Name: Date of Service: NAKEMA, FAKE 09/12/2020 12:30 PM Medical Record Number: [id number] Patient Account Number: [id number] Date of Birth/Sex: Treating RN: 17-Jun-1941 (79 y.o. [name] Primary Care Val Schiavo: Nicholes Stairs Other Clinician: Referring Venus Gilles: Treating Tawn Fitzner/Extender: Nyra Jabs in Treatment: 2 Edema Assessment Assessed: [name]: No] [Right: No] Edema: [Left: N] [Right: o] Calf Left: Right: Point of Measurement: cm From Medial Instep 19.7 cm cm Ankle Left: Right: Point of Measurement: cm From Medial Instep 16.5 cm cm Vascular Assessment Pulses: Dorsalis Pedis Palpable: [Left:Yes] Electronic Signature(s) Signed: 09/12/2020 5:02:37 PM By: Baruch Gouty RN, BSN Entered By: Baruch Gouty on 09/12/2020 12:54:43 -------------------------------------------------------------------------------- Multi Wound Chart Details Patient Name: Date of Service: [name] 09/12/2020 12:30 PM Medical Record Number: [id number] Patient Account Number: [id number] Date of Birth/Sex: Treating RN: 27-Jul-1941 (79 y.o. [name] Primary Care Mija Effertz: Nicholes Stairs Other Clinician: Referring Clovis Warwick: Treating Maleiah Dula/Extender: Nyra Jabs in Treatment: 2 Vital Signs Height(in):  9 Pulse(bpm): 101 Weight(lbs): 141 Blood Pressure(mmHg): 143/70 Body Mass Index(BMI): 23 Temperature(F): 98.3 Respiratory Rate(breaths/min): 18 Photos: [1:No Photos Left, Anterior Lower Leg] [2:No Photos Left, Lateral Lower Leg] [N/A:N/A N/A] Wound Location: [1:Gradually Appeared] [2:Gradually Appeared] [N/A:N/A] Wounding Event: [1:Venous Leg Ulcer] [2:Diabetic Wound/Ulcer of the Lower] [N/A:N/A] Primary Etiology: [1:N/A] [2:Extremity Venous Leg Ulcer] [N/A:N/A] Secondary Etiology: [1:Cataracts, Glaucoma, Hypertension, Cataracts, Glaucoma, Hypertension,] [N/A:N/A] Comorbid History: [1:Type II Diabetes, Osteoarthritis, Type II Diabetes, Osteoarthritis, Neuropathy 06/15/2020] [2:Neuropathy 06/15/2020] [N/A:N/A] Date Acquired: [1:2] [2:2] [N/A:N/A] Weeks of Treatment: [1:Open] [2:Open] [N/A:N/A] Wound Status: [1:1.4x1.1x0.1] [2:1.4x0.9x0.1] [N/A:N/A] Measurements  L x W x D (cm) [1:1.21] [2:0.99] [N/A:N/A] A (cm) : rea [1:0.121] [2:0.099] [N/A:N/A] Volume (cm) : [1:14.40%] [2:-28.60%] [N/A:N/A] % Reduction in A rea: [1:14.20%] [2:-28.60%] [N/A:N/A] % Reduction in Volume: [2:12] Starting Position 1 (o'clock): [2:12] Ending Position 1 (o'clock): [2:2.7] Maximum Distance 1 (cm): [1:No] [2:Yes] [N/A:N/A] Undermining: [1:Full Thickness Without Exposed] [2:Grade 1] [N/A:N/A] Classification: [1:Support Structures Medium] [2:Medium] [N/A:N/A] Exudate A mount: [1:Serous] [2:Serous] [N/A:N/A] Exudate Type: [1:amber] [2:amber] [N/A:N/A] Exudate Color: [1:Flat and Intact] [2:Well defined, not attached] [N/A:N/A] Wound Margin: [1:Small (1-33%)] [2:Small (1-33%)] [N/A:N/A] Granulation A mount: [1:Pink] [2:Pink] [N/A:N/A] Granulation Quality: [1:Large (67-100%)] [2:Large (67-100%)] [N/A:N/A] Necrotic A mount: [1:Fat Layer (Subcutaneous Tissue): Yes Fat Layer (Subcutaneous Tissue): Yes N/A] Exposed Structures: [1:Fascia: No Tendon: No Muscle: No Joint: No Bone: No None] [2:Fascia: No Tendon: No  Muscle: No Joint: No Bone: No None] [N/A:N/A] Epithelialization: [1:Debridement - Excisional] [2:Debridement - Excisional] [N/A:N/A] Debridement: Pre-procedure Verification/Time Out 13:13 [2:13:13] [N/A:N/A] Taken: [1:Lidocaine 5% topical ointment] [2:Lidocaine 5% topical ointment] [N/A:N/A] Pain Control: [1:Subcutaneous, Slough] [2:Subcutaneous, Slough] [N/A:N/A] Tissue Debrided: [1:Skin/Subcutaneous Tissue] [2:Skin/Subcutaneous Tissue] [N/A:N/A] Level: [1:1.54] [2:1.26] [N/A:N/A] Debridement A (sq cm): [1:rea Curette] [2:Curette] [N/A:N/A] Instrument: [1:Moderate] [2:Moderate] [N/A:N/A] Bleeding: [1:Pressure] [2:Pressure] [N/A:N/A] Hemostasis A chieved: [1:3] [2:3] [N/A:N/A] Procedural Pain: [1:0] [2:0] [N/A:N/A] Post Procedural Pain: [1:Procedure was tolerated well] [2:Procedure was tolerated well] [N/A:N/A] Debridement Treatment Response: [1:1.4x1.1x0.1] [2:1.4x0.9x0.1] [N/A:N/A] Post Debridement Measurements L x W x D (cm) [1:0.121] [2:0.099] [N/A:N/A] Post Debridement Volume: (cm) [1:Debridement] [2:Debridement] [N/A:N/A] Treatment Notes Electronic Signature(s) Signed: 09/12/2020 5:12:45 PM By: Linton Ham MD Signed: 09/18/2020 1:15:48 PM By: Carlene Coria RN Entered By: Linton Ham on 09/12/2020 13:43:56 -------------------------------------------------------------------------------- Multi-Disciplinary Care Plan Details Patient Name: Date of Service: [place], [place]. 09/12/2020 12:30 PM Medical Record Number: [id number] Patient Account Number: [id number] Date of Birth/Sex: Treating RN: 1941/10/19 (79 y.o. [name] Primary Care Danial Sisley: Nicholes Stairs Other Clinician: Referring Shar Paez: Treating Alvetta Hidrogo/Extender: Nyra Jabs in Treatment: 2 Active Inactive Wound/Skin Impairment Nursing Diagnoses: Knowledge deficit related to ulceration/compromised skin integrity Goals: Patient/caregiver will verbalize understanding of skin  care regimen Date Initiated: 08/29/2020 Target Resolution Date: 09/28/2020 Goal Status: Active Ulcer/skin breakdown will have a volume reduction of 30% by week 4 Date Initiated: 08/29/2020 Target Resolution Date: 09/28/2020 Goal Status: Active Interventions: Assess patient/caregiver ability to obtain necessary supplies Assess patient/caregiver ability to perform ulcer/skin care regimen upon admission and as needed Assess ulceration(s) every visit Notes: Electronic Signature(s) Signed: 09/18/2020 1:15:48 PM By: Carlene Coria RN Entered By: Carlene Coria on 09/12/2020 12:50:49 -------------------------------------------------------------------------------- Pain Assessment Details Patient Name: Date of Service: RUBYLEE, ZAMARRIPA 09/12/2020 12:30 PM Medical Record Number: [id number] Patient Account Number: [id number] Date of Birth/Sex: Treating RN: 1941/09/12 (79 y.o. [name] Primary Care Verner Kopischke: Nicholes Stairs Other Clinician: Referring Imajean Mcdermid: Treating Jayten Gabbard/Extender: Nyra Jabs in Treatment: 2 Active Problems Location of Pain Severity and Description of Pain Patient Has Paino Yes Site Locations Pain Location: Pain in Ulcers With Dressing Change: Yes Duration of the Pain. Constant / Intermittento Intermittent Rate the pain. Current Pain Level: 8 Worst Pain Level: 10 Least Pain Level: 0 Character of Pain Describe the Pain: Aching, Sharp, Shooting, Throbbing Pain Management and Medication Current Pain Management: Medication: Yes Is the Current Pain Management Adequate: Adequate Rest: Yes How does your wound impact your activities of daily livingo Sleep: Yes Bathing: No Appetite: No Relationship With Others: No Bladder Continence: No Emotions: Yes Bowel Continence: No Work: No Toileting: No  Drive: No Dressing: No Hobbies: No Electronic Signature(s) Signed: 09/12/2020 5:02:37 PM By: Baruch Gouty RN, BSN Entered By:  Baruch Gouty on 09/12/2020 12:50:33 -------------------------------------------------------------------------------- Patient/Caregiver Education Details Patient Name: Date of Service: [name] 9/14/2021andnbsp12:30 PM Medical Record Number: [id number] Patient Account Number: [id number] Date of Birth/Gender: Treating RN: [birth year] (79 y.o. [name] Primary Care Physician: Nicholes Stairs Other Clinician: Referring Physician: Treating Physician/Extender: Nyra Jabs in Treatment: 2 Education Assessment Education Provided To: Patient Education Topics Provided Wound/Skin Impairment: Methods: Explain/Verbal Responses: State content correctly Electronic Signature(s) Signed: 09/18/2020 1:15:48 PM By: Carlene Coria RN Entered By: Carlene Coria on 09/12/2020 12:51:10 -------------------------------------------------------------------------------- Wound Assessment Details Patient Name: Date of Service: BRETA, DEMEDEIROS 09/12/2020 12:30 PM Medical Record Number: [id number] Patient Account Number: [id number] Date of Birth/Sex: Treating RN: 1941-07-19 (79 y.o. [name] Primary Care Burnis Halling: Nicholes Stairs Other Clinician: Referring Kaydon Creedon: Treating Deirdra Heumann/Extender: Nyra Jabs in Treatment: 2 Wound Status Wound Number: 1 Primary Venous Leg Ulcer Etiology: Wound Location: Left, Anterior Lower Leg Wound Open Wounding Event: Gradually Appeared Status: Date Acquired: 06/15/2020 Comorbid Cataracts, Glaucoma, Hypertension, Type II Diabetes, Weeks Of Treatment: 2 History: Osteoarthritis, Neuropathy Clustered Wound: No Photos Photo Uploaded By: Mikeal Hawthorne on 09/14/2020 11:24:30 Wound Measurements Length: (cm) 1.4 Width: (cm) 1.1 Depth: (cm) 0.1 Area: (cm) 1.21 Volume: (cm) 0.121 % Reduction in Area: 14.4% % Reduction in Volume: 14.2% Epithelialization: None Tunneling:  No Undermining: No Wound Description Classification: Full Thickness Without Exposed Support Structures Wound Margin: Flat and Intact Exudate Amount: Medium Exudate Type: Serous Exudate Color: amber Foul Odor After Cleansing: No Slough/Fibrino Yes Wound Bed Granulation Amount: Small (1-33%) Exposed Structure Granulation Quality: Pink Fascia Exposed: No Necrotic Amount: Large (67-100%) Fat Layer (Subcutaneous Tissue) Exposed: Yes Necrotic Quality: Adherent Slough Tendon Exposed: No Muscle Exposed: No Joint Exposed: No Bone Exposed: No Treatment Notes Wound #1 (Left, Anterior Lower Leg) 1. Cleanse With Wound Cleanser Soap and water 3. Primary Dressing Applied Iodoflex 4. Secondary Dressing ABD Pad 6. Support Layer Holiday representative) Signed: 09/12/2020 5:02:37 PM By: Baruch Gouty RN, BSN Entered By: Baruch Gouty on 09/12/2020 13:00:09 -------------------------------------------------------------------------------- Wound Assessment Details Patient Name: Date of Service: [name] 09/12/2020 12:30 PM Medical Record Number: [id number] Patient Account Number: [id number] Date of Birth/Sex: Treating RN: Oct 05, 1941 (79 y.o. [name] Primary Care Jeffren Dombek: Nicholes Stairs Other Clinician: Referring Abaigeal Moomaw: Treating Johnathen Testa/Extender: Nyra Jabs in Treatment: 2 Wound Status Wound Number: 2 Primary Diabetic Wound/Ulcer of the Lower Extremity Etiology: Wound Location: Left, Lateral Lower Leg Secondary Venous Leg Ulcer Wounding Event: Gradually Appeared Etiology: Date Acquired: 06/15/2020 Wound Status: Open Weeks Of Treatment: 2 Comorbid Cataracts, Glaucoma, Hypertension, Type II Diabetes, Clustered Wound: No History: Osteoarthritis, Neuropathy Photos Photo Uploaded By: Mikeal Hawthorne on 09/14/2020 11:24:31 Wound Measurements Length: (cm) 1.4 Width: (cm) 0.9 Depth: (cm) 0.1 Area: (cm)  0.99 Volume: (cm) 0.099 % Reduction in Area: -28.6% % Reduction in Volume: -28.6% Epithelialization: None Tunneling: No Undermining: Yes Starting Position (o'clock): 12 Ending Position (o'clock): 12 Maximum Distance: (cm) 2.7 Wound Description Classification: Grade 1 Wound Margin: Well defined, not attached Exudate Amount: Medium Exudate Type: Serous Exudate Color: amber Foul Odor After Cleansing: No Slough/Fibrino Yes Wound Bed Granulation Amount: Small (1-33%) Exposed Structure Granulation Quality: Pink Fascia Exposed: No Necrotic Amount: Large (67-100%) Fat Layer (Subcutaneous Tissue) Exposed: Yes Necrotic Quality: Adherent Slough Tendon Exposed: No Muscle Exposed: No Joint Exposed:  No Bone Exposed: No Treatment Notes Wound #2 (Left, Lateral Lower Leg) 1. Cleanse With Wound Cleanser Soap and water 3. Primary Dressing Applied Iodoflex 4. Secondary Dressing ABD Pad 6. Support Layer Holiday representative) Signed: 09/12/2020 5:02:37 PM By: Baruch Gouty RN, BSN Entered By: Baruch Gouty on 09/12/2020 13:01:54 -------------------------------------------------------------------------------- [facility] Details Patient Name: Date of Service: [name], [name] 09/12/2020 12:30 PM Medical Record Number: [id number] Patient Account Number: [id number] Date of Birth/Sex: Treating RN: 12/18/1941 (79 y.o. [name] Primary Care Yoshie Kosel: Nicholes Stairs Other Clinician: Referring Lashaun Krapf: Treating Aishi Courts/Extender: Nyra Jabs in Treatment: 2 Vital Signs Time Taken: 12:46 Temperature (F): 98.3 Height (in): 66 Pulse (bpm): 101 Source: Stated Respiratory Rate (breaths/min): 18 Weight (lbs): 141 Blood Pressure (mmHg): 143/70 Source: Stated Reference Range: 80 - 120 mg / dl Body Mass Index (BMI): 22.8 Electronic Signature(s) Signed: 09/12/2020 5:02:37 PM By: Baruch Gouty RN, BSN Entered By:  Baruch Gouty on 09/12/2020 12:46:48

## 2020-09-20 DIAGNOSIS — E1143 Type 2 diabetes mellitus with diabetic autonomic (poly)neuropathy: Secondary | ICD-10-CM | POA: Diagnosis not present

## 2020-09-20 DIAGNOSIS — E1122 Type 2 diabetes mellitus with diabetic chronic kidney disease: Secondary | ICD-10-CM | POA: Diagnosis not present

## 2020-09-20 DIAGNOSIS — D869 Sarcoidosis, unspecified: Secondary | ICD-10-CM | POA: Diagnosis not present

## 2020-09-20 DIAGNOSIS — G2581 Restless legs syndrome: Secondary | ICD-10-CM | POA: Diagnosis not present

## 2020-09-20 DIAGNOSIS — E1142 Type 2 diabetes mellitus with diabetic polyneuropathy: Secondary | ICD-10-CM | POA: Diagnosis not present

## 2020-09-20 DIAGNOSIS — N1831 Chronic kidney disease, stage 3a: Secondary | ICD-10-CM | POA: Diagnosis not present

## 2020-09-20 DIAGNOSIS — E2740 Unspecified adrenocortical insufficiency: Secondary | ICD-10-CM | POA: Diagnosis not present

## 2020-09-20 DIAGNOSIS — M1710 Unilateral primary osteoarthritis, unspecified knee: Secondary | ICD-10-CM | POA: Diagnosis not present

## 2020-09-20 DIAGNOSIS — L89891 Pressure ulcer of other site, stage 1: Secondary | ICD-10-CM | POA: Diagnosis not present

## 2020-09-20 DIAGNOSIS — I129 Hypertensive chronic kidney disease with stage 1 through stage 4 chronic kidney disease, or unspecified chronic kidney disease: Secondary | ICD-10-CM | POA: Diagnosis not present

## 2020-09-22 DIAGNOSIS — N1831 Chronic kidney disease, stage 3a: Secondary | ICD-10-CM | POA: Diagnosis not present

## 2020-09-22 DIAGNOSIS — E1143 Type 2 diabetes mellitus with diabetic autonomic (poly)neuropathy: Secondary | ICD-10-CM | POA: Diagnosis not present

## 2020-09-22 DIAGNOSIS — E1142 Type 2 diabetes mellitus with diabetic polyneuropathy: Secondary | ICD-10-CM | POA: Diagnosis not present

## 2020-09-22 DIAGNOSIS — G2581 Restless legs syndrome: Secondary | ICD-10-CM | POA: Diagnosis not present

## 2020-09-22 DIAGNOSIS — L89891 Pressure ulcer of other site, stage 1: Secondary | ICD-10-CM | POA: Diagnosis not present

## 2020-09-22 DIAGNOSIS — E1122 Type 2 diabetes mellitus with diabetic chronic kidney disease: Secondary | ICD-10-CM | POA: Diagnosis not present

## 2020-09-22 DIAGNOSIS — D869 Sarcoidosis, unspecified: Secondary | ICD-10-CM | POA: Diagnosis not present

## 2020-09-22 DIAGNOSIS — E2740 Unspecified adrenocortical insufficiency: Secondary | ICD-10-CM | POA: Diagnosis not present

## 2020-09-22 DIAGNOSIS — I129 Hypertensive chronic kidney disease with stage 1 through stage 4 chronic kidney disease, or unspecified chronic kidney disease: Secondary | ICD-10-CM | POA: Diagnosis not present

## 2020-09-22 DIAGNOSIS — M1710 Unilateral primary osteoarthritis, unspecified knee: Secondary | ICD-10-CM | POA: Diagnosis not present

## 2020-09-25 DIAGNOSIS — E1122 Type 2 diabetes mellitus with diabetic chronic kidney disease: Secondary | ICD-10-CM | POA: Diagnosis not present

## 2020-09-25 DIAGNOSIS — M1990 Unspecified osteoarthritis, unspecified site: Secondary | ICD-10-CM | POA: Diagnosis not present

## 2020-09-25 DIAGNOSIS — N189 Chronic kidney disease, unspecified: Secondary | ICD-10-CM | POA: Diagnosis not present

## 2020-09-25 DIAGNOSIS — H409 Unspecified glaucoma: Secondary | ICD-10-CM | POA: Diagnosis not present

## 2020-09-25 DIAGNOSIS — I10 Essential (primary) hypertension: Secondary | ICD-10-CM | POA: Diagnosis not present

## 2020-09-25 DIAGNOSIS — M8580 Other specified disorders of bone density and structure, unspecified site: Secondary | ICD-10-CM | POA: Diagnosis not present

## 2020-09-25 DIAGNOSIS — E1142 Type 2 diabetes mellitus with diabetic polyneuropathy: Secondary | ICD-10-CM | POA: Diagnosis not present

## 2020-09-25 DIAGNOSIS — N183 Chronic kidney disease, stage 3 unspecified: Secondary | ICD-10-CM | POA: Diagnosis not present

## 2020-09-25 DIAGNOSIS — M810 Age-related osteoporosis without current pathological fracture: Secondary | ICD-10-CM | POA: Diagnosis not present

## 2020-09-25 DIAGNOSIS — E782 Mixed hyperlipidemia: Secondary | ICD-10-CM | POA: Diagnosis not present

## 2020-09-26 ENCOUNTER — Other Ambulatory Visit: Payer: Self-pay

## 2020-09-26 ENCOUNTER — Encounter (HOSPITAL_BASED_OUTPATIENT_CLINIC_OR_DEPARTMENT_OTHER): Payer: Medicare HMO | Admitting: Internal Medicine

## 2020-09-26 DIAGNOSIS — L97821 Non-pressure chronic ulcer of other part of left lower leg limited to breakdown of skin: Secondary | ICD-10-CM | POA: Diagnosis not present

## 2020-09-26 DIAGNOSIS — N183 Chronic kidney disease, stage 3 unspecified: Secondary | ICD-10-CM | POA: Diagnosis not present

## 2020-09-26 DIAGNOSIS — I129 Hypertensive chronic kidney disease with stage 1 through stage 4 chronic kidney disease, or unspecified chronic kidney disease: Secondary | ICD-10-CM | POA: Diagnosis not present

## 2020-09-26 DIAGNOSIS — E1140 Type 2 diabetes mellitus with diabetic neuropathy, unspecified: Secondary | ICD-10-CM | POA: Diagnosis not present

## 2020-09-26 DIAGNOSIS — I872 Venous insufficiency (chronic) (peripheral): Secondary | ICD-10-CM | POA: Diagnosis not present

## 2020-09-26 DIAGNOSIS — E1122 Type 2 diabetes mellitus with diabetic chronic kidney disease: Secondary | ICD-10-CM | POA: Diagnosis not present

## 2020-09-26 DIAGNOSIS — Z92241 Personal history of systemic steroid therapy: Secondary | ICD-10-CM | POA: Diagnosis not present

## 2020-09-26 DIAGNOSIS — D869 Sarcoidosis, unspecified: Secondary | ICD-10-CM | POA: Diagnosis not present

## 2020-09-26 DIAGNOSIS — L97819 Non-pressure chronic ulcer of other part of right lower leg with unspecified severity: Secondary | ICD-10-CM | POA: Diagnosis not present

## 2020-09-26 DIAGNOSIS — E2740 Unspecified adrenocortical insufficiency: Secondary | ICD-10-CM | POA: Diagnosis not present

## 2020-09-26 NOTE — Progress Notes (Signed)
Sonya Goodwin, Sonya Goodwin ([id number]) Visit Report for 09/26/2020 Arrival Information Details Patient Name: Date of Service: [place], [name] 09/26/2020 12:30 PM Medical Record Number: [id number] Patient Account Number: [id number] Date of Birth/Sex: Treating RN: 05-Mar-1941 (79 y.o. Martyn Malay, Vaughan Basta Primary Care Tully Mcinturff: Nicholes Stairs Other Clinician: Referring Tatem Holsonback: Treating Ceasar Decandia/Extender: Nyra Jabs in Treatment: 4 Visit Information History Since Last Visit Added or deleted any medications: No Patient Arrived: Wheel Chair Any new allergies or adverse reactions: No Arrival Time: 12:36 Had a fall or experienced change in No Accompanied By: spouse activities of daily living that may affect Transfer Assistance: None risk of falls: Patient Identification Verified: Yes Signs or symptoms of abuse/neglect since last visito No Secondary Verification Process Completed: Yes Hospitalized since last visit: No Patient Requires Transmission-Based Precautions: No Implantable device outside of the clinic excluding No Patient Has Alerts: No cellular tissue based products placed in the center since last visit: Has Dressing in Place as Prescribed: Yes Has Compression in Place as Prescribed: Yes Pain Present Now: Yes Electronic Signature(s) Signed: 09/26/2020 5:46:00 PM By: Baruch Gouty RN, BSN Entered By: Baruch Gouty on 09/26/2020 12:42:32 -------------------------------------------------------------------------------- Compression Therapy Details Patient Name: Date of Service: Sonya Goodwin. 09/26/2020 12:30 PM Medical Record Number: [id number] Patient Account Number: [id number] Date of Birth/Sex: Treating RN: 07/29/1941 (79 y.o. [name] Primary Care Rayshawn Visconti: Nicholes Stairs Other Clinician: Referring Jaimie Pippins: Treating Monicia Tse/Extender: Nyra Jabs in Treatment: 4 Compression Therapy Performed for Wound  Assessment: Wound #1 Left,Anterior Lower Leg Performed By: Clinician Carlene Coria, RN Compression Type: Double Layer Post Procedure Diagnosis Same as Pre-procedure Electronic Signature(s) Signed: 09/26/2020 5:34:55 PM By: Carlene Coria RN Entered By: Carlene Coria on 09/26/2020 13:48:37 -------------------------------------------------------------------------------- Compression Therapy Details Patient Name: Date of Service: Sonya Goodwin, Sonya Goodwin 09/26/2020 12:30 PM Medical Record Number: [id number] Patient Account Number: [id number] Date of Birth/Sex: Treating RN: 12/28/1941 (79 y.o. [name] Primary Care Satchel Heidinger: Nicholes Stairs Other Clinician: Referring Chelan Heringer: Treating Eyob Godlewski/Extender: Nyra Jabs in Treatment: 4 Compression Therapy Performed for Wound Assessment: Wound #2 Left,Lateral Lower Leg Performed By: Clinician Carlene Coria, RN Compression Type: Double Layer Post Procedure Diagnosis Same as Pre-procedure Electronic Signature(s) Signed: 09/26/2020 5:34:55 PM By: Carlene Coria RN Entered By: Carlene Coria on 09/26/2020 13:48:38 -------------------------------------------------------------------------------- Compression Therapy Details Patient Name: Date of Service: Sonya Goodwin, Sonya Goodwin 09/26/2020 12:30 PM Medical Record Number: [id number] Patient Account Number: [id number] Date of Birth/Sex: Treating RN: 1941-08-13 (79 y.o. [name] Primary Care Macie Baum: Nicholes Stairs Other Clinician: Referring Adoria Kawamoto: Treating Javen Ridings/Extender: Nyra Jabs in Treatment: 4 Compression Therapy Performed for Wound Assessment: Wound #3 Right,Anterior Lower Leg Performed By: Clinician Carlene Coria, RN Compression Type: Double Layer Post Procedure Diagnosis Same as Pre-procedure Electronic Signature(s) Signed: 09/26/2020 5:34:55 PM By: Carlene Coria RN Entered By: Carlene Coria on 09/26/2020  13:48:38 -------------------------------------------------------------------------------- Encounter Discharge Information Details Patient Name: Date of Service: Sonya Goodwin. 09/26/2020 12:30 PM Medical Record Number: [id number] Patient Account Number: [id number] Date of Birth/Sex: Treating RN: 01-22-1941 (79 y.o. Clearnce Sorrel Primary Care Ariadne Rissmiller: Nicholes Stairs Other Clinician: Referring Dalbert Stillings: Treating Arnaldo Heffron/Extender: Nyra Jabs in Treatment: 4 Encounter Discharge Information Items Discharge Condition: Stable Ambulatory Status: Wheelchair Discharge Destination: Home Transportation: Private Auto Accompanied By: husband Schedule Follow-up Appointment: Yes Clinical Summary of Care: Patient Declined Electronic Signature(s) Signed: 09/26/2020 5:04:51 PM By: Kela Millin Entered By: Verita Schneiders,  Shannon on 09/26/2020 14:53:23 -------------------------------------------------------------------------------- Lower Extremity Assessment Details Patient Name: Date of Service: [place], [name] 09/26/2020 12:30 PM Medical Record Number: [id number] Patient Account Number: [id number] Date of Birth/Sex: Treating RN: 1941/08/03 (79 y.o. [name] Primary Care Caralynn Gelber: Nicholes Stairs Other Clinician: Referring Kalyiah Saintil: Treating Melanny Wire/Extender: Nyra Jabs in Treatment: 4 Edema Assessment Assessed: [name]: No] [name]: No] Edema: [Left: Yes] [Right: Yes] Calf Left: Right: Point of Measurement: From Medial Instep 19.7 cm 24.5 cm Ankle Left: Right: Point of Measurement: From Medial Instep 16.5 cm 18.5 cm Vascular Assessment Pulses: Dorsalis Pedis Palpable: [Left:Yes] [Right:Yes] Blood Pressure: Brachial: [Right:163] Ankle: [Right:Dorsalis Pedis: 194 1.19] Electronic Signature(s) Signed: 09/26/2020 5:46:00 PM By: Baruch Gouty RN, BSN Entered By: Baruch Gouty on 09/26/2020  13:10:24 -------------------------------------------------------------------------------- Multi Wound Chart Details Patient Name: Date of Service: Sonya Goodwin. 09/26/2020 12:30 PM Medical Record Number: [id number] Patient Account Number: [id number] Date of Birth/Sex: Treating RN: 11/29/1941 (79 y.o. [name] Primary Care Aly Hauser: Nicholes Stairs Other Clinician: Referring Dilana Mcphie: Treating Jodette Wik/Extender: Nyra Jabs in Treatment: 4 Vital Signs Height(in): 67 Pulse(bpm): 119 Weight(lbs): 141 Blood Pressure(mmHg): 163/55 Body Mass Index(BMI): 23 Temperature(F): 97.9 Respiratory Rate(breaths/min): 20 Photos: [1:No Photos Left, Anterior Lower Leg] [2:No Photos Left, Lateral Lower Leg] [3:No Photos Right, Anterior Lower Leg] Wound Location: [1:Gradually Appeared] [2:Gradually Appeared] [3:Trauma] Wounding Event: [1:Venous Leg Ulcer] [2:Diabetic Wound/Ulcer of the Lower] [3:Skin T ear] Primary Etiology: [1:N/A] [2:Extremity Venous Leg Ulcer] [3:Venous Leg Ulcer] Secondary Etiology: [1:Cataracts, Glaucoma, Hypertension, Cataracts, Glaucoma, Hypertension, Cataracts, Glaucoma, Hypertension,] Comorbid History: [1:Type II Diabetes, Osteoarthritis, Neuropathy 06/15/2020] [2:Type II Diabetes, Osteoarthritis, Neuropathy 06/15/2020] [3:Type II Diabetes, Osteoarthritis, Neuropathy 09/24/2020] Date Acquired: [1:4] [2:4] [3:0] Weeks of Treatment: [1:Open] [2:Open] [3:Open] Wound Status: [1:1.8x1.3x0.2] [2:1.4x1x0.2] [3:1.5x1.1x0.1] Measurements L x W x D (cm) [1:1.838] [2:1.1] [3:N/A] A (cm) : rea [1:0.368] [2:0.22] [3:N/A] Volume (cm) : [1:-30.00%] [2:-42.90%] [3:N/A] % Reduction in Area: [1:-161.00%] [2:-185.70%] [3:N/A] % Reduction in Volume: [1:Full Thickness Without Exposed] [2:Grade 1] [3:Full Thickness Without Exposed] Classification: [1:Support Structures Medium] [2:Medium] [3:Support Structures Large] Exudate Amount: [1:Serous] [2:Serous]  [3:Serous] Exudate Type: [1:amber] [2:amber] [3:amber] Exudate Color: [1:Flat and Intact] [2:Well defined, not attached] [3:Flat and Intact] Wound Margin: [1:Medium (34-66%)] [2:None Present (0%)] [3:Large (67-100%)] Granulation Amount: [1:Red] [2:N/A] [3:Red] Granulation Quality: [1:Medium (34-66%)] [2:Large (67-100%)] [3:None Present (0%)] Necrotic Amount: [1:Fat Layer (Subcutaneous Tissue): Yes Fat Layer (Subcutaneous Tissue): Yes Fat Layer (Subcutaneous Tissue): Yes] Exposed Structures: [1:Fascia: No Tendon: No Muscle: No Joint: No Bone: No None] [2:Fascia: No Tendon: No Muscle: No Joint: No Bone: No None] [3:Fascia: No Tendon: No Muscle: No Joint: No Bone: No Small (1-33%)] Epithelialization: [1:Compression Therapy] [2:Compression Therapy] [3:Compression Therapy] Treatment Notes Electronic Signature(s) Signed: 09/26/2020 5:19:28 PM By: Linton Ham MD Signed: 09/26/2020 5:34:55 PM By: Carlene Coria RN Entered By: Linton Ham on 09/26/2020 14:18:06 -------------------------------------------------------------------------------- Multi-Disciplinary Care Plan Details Patient Name: Date of Service: [place], [place]. 09/26/2020 12:30 PM Medical Record Number: [id number] Patient Account Number: [id number] Date of Birth/Sex: Treating RN: 08-04-1941 (79 y.o. [name] Primary Care Kerri-Anne Haeberle: Nicholes Stairs Other Clinician: Referring Donnalynn Wheeless: Treating Lauren Aguayo/Extender: Nyra Jabs in Treatment: 4 Active Inactive Wound/Skin Impairment Nursing Diagnoses: Knowledge deficit related to ulceration/compromised skin integrity Goals: Patient/caregiver will verbalize understanding of skin care regimen Date Initiated: 08/29/2020 Target Resolution Date: 10/28/2020 Goal Status: Active Ulcer/skin breakdown will have a volume reduction of 30% by week 4 Date Initiated: 08/29/2020 Date Inactivated: 09/26/2020  Target Resolution Date: 09/28/2020 Goal Status:  Unmet Unmet Reason: comorbities Ulcer/skin breakdown will have a volume reduction of 50% by week 8 Date Initiated: 09/26/2020 Target Resolution Date: 10/28/2020 Goal Status: Active Interventions: Assess patient/caregiver ability to obtain necessary supplies Assess patient/caregiver ability to perform ulcer/skin care regimen upon admission and as needed Assess ulceration(s) every visit Notes: Electronic Signature(s) Signed: 09/26/2020 5:34:55 PM By: Carlene Coria RN Entered By: Carlene Coria on 09/26/2020 12:35:49 -------------------------------------------------------------------------------- Pain Assessment Details Patient Name: Date of Service: Sonya Goodwin, Sonya Goodwin 09/26/2020 12:30 PM Medical Record Number: [id number] Patient Account Number: [id number] Date of Birth/Sex: Treating RN: [birth year] (79 y.o. [name] Primary Care Azeem Poorman: Nicholes Stairs Other Clinician: Referring Caron Tardif: Treating Arrington Yohe/Extender: Nyra Jabs in Treatment: 4 Active Problems Location of Pain Severity and Description of Pain Patient Has Paino Yes Site Locations Pain Location: Generalized Pain, Pain in Ulcers With Dressing Change: Yes Duration of the Pain. Constant / Intermittento Intermittent Rate the pain. Current Pain Level: 5 Worst Pain Level: 8 Least Pain Level: 0 Character of Pain Describe the Pain: Aching, Other: sore Pain Management and Medication Current Pain Management: Medication: Yes Is the Current Pain Management Adequate: Adequate Rest: Yes How does your wound impact your activities of daily livingo Sleep: Yes Bathing: No Appetite: No Relationship With Others: No Bladder Continence: No Emotions: No Bowel Continence: No Hobbies: No Toileting: No Dressing: No Electronic Signature(s) Signed: 09/26/2020 5:46:00 PM By: Baruch Gouty RN, BSN Entered By: Baruch Gouty on 09/26/2020  12:45:43 -------------------------------------------------------------------------------- Patient/Caregiver Education Details Patient Name: Date of Service: Sonya Goodwin 9/28/2021andnbsp12:30 PM Medical Record Number: [id number] Patient Account Number: [id number] Date of Birth/Gender: Treating RN: Jul 08, 1941 (79 y.o. [name] Primary Care Physician: Nicholes Stairs Other Clinician: Referring Physician: Treating Physician/Extender: Nyra Jabs in Treatment: 4 Education Assessment Education Provided To: Patient Education Topics Provided Wound/Skin Impairment: Methods: Explain/Verbal Responses: State content correctly Electronic Signature(s) Signed: 09/26/2020 5:34:55 PM By: Carlene Coria RN Entered By: Carlene Coria on 09/26/2020 12:36:01 -------------------------------------------------------------------------------- Wound Assessment Details Patient Name: Date of Service: Sonya Goodwin, Sonya Goodwin 09/26/2020 12:30 PM Medical Record Number: [id number] Patient Account Number: [id number] Date of Birth/Sex: Treating RN: Jan 20, 1941 (79 y.o. [name] Primary Care Paizlie Klaus: Nicholes Stairs Other Clinician: Referring Jenavie Stanczak: Treating Bernette Seeman/Extender: Nyra Jabs in Treatment: 4 Wound Status Wound Number: 1 Primary Venous Leg Ulcer Etiology: Wound Location: Left, Anterior Lower Leg Wound Open Wounding Event: Gradually Appeared Status: Date Acquired: 06/15/2020 Comorbid Cataracts, Glaucoma, Hypertension, Type II Diabetes, Weeks Of Treatment: 4 History: Osteoarthritis, Neuropathy Clustered Wound: No Wound Measurements Length: (cm) 1.8 Width: (cm) 1.3 Depth: (cm) 0.2 Area: (cm) 1.838 Volume: (cm) 0.368 % Reduction in Area: -30% % Reduction in Volume: -161% Epithelialization: None Tunneling: No Undermining: No Wound Description Classification: Full Thickness Without Exposed Support  Structures Wound Margin: Flat and Intact Exudate Amount: Medium Exudate Type: Serous Exudate Color: amber Foul Odor After Cleansing: No Slough/Fibrino Yes Wound Bed Granulation Amount: Medium (34-66%) Exposed Structure Granulation Quality: Red Fascia Exposed: No Necrotic Amount: Medium (34-66%) Fat Layer (Subcutaneous Tissue) Exposed: Yes Necrotic Quality: Adherent Slough Tendon Exposed: No Muscle Exposed: No Joint Exposed: No Bone Exposed: No Treatment Notes Wound #1 (Left, Anterior Lower Leg) 1. Cleanse With Wound Cleanser Soap and water 2. Periwound Care Moisturizing lotion 3. Primary Dressing Applied Collegen AG 4. Secondary Dressing ABD Pad Dry Gauze 6. Support Layer Holiday representative) Signed: 09/26/2020 5:46:00 PM By:  Baruch Gouty RN, BSN Entered By: Baruch Gouty on 09/26/2020 13:01:20 -------------------------------------------------------------------------------- Wound Assessment Details Patient Name: Date of Service: Sonya Goodwin, Sonya Goodwin 09/26/2020 12:30 PM Medical Record Number: [id number] Patient Account Number: [id number] Date of Birth/Sex: Treating RN: 11-Mar-1941 (79 y.o. [name] Primary Care Jennah Satchell: Nicholes Stairs Other Clinician: Referring Duston Smolenski: Treating Kennia Vanvorst/Extender: Nyra Jabs in Treatment: 4 Wound Status Wound Number: 2 Primary Diabetic Wound/Ulcer of the Lower Extremity Etiology: Wound Location: Left, Lateral Lower Leg Secondary Venous Leg Ulcer Wounding Event: Gradually Appeared Etiology: Date Acquired: 06/15/2020 Wound Status: Open Weeks Of Treatment: 4 Comorbid Cataracts, Glaucoma, Hypertension, Type II Diabetes, Clustered Wound: No History: Osteoarthritis, Neuropathy Wound Measurements Length: (cm) 1.4 Width: (cm) 1 Depth: (cm) 0.2 Area: (cm) 1.1 Volume: (cm) 0.22 % Reduction in Area: -42.9% % Reduction in Volume: -185.7% Epithelialization:  None Tunneling: No Undermining: No Wound Description Classification: Grade 1 Wound Margin: Well defined, not attached Exudate Amount: Medium Exudate Type: Serous Exudate Color: amber Foul Odor After Cleansing: No Slough/Fibrino Yes Wound Bed Granulation Amount: None Present (0%) Exposed Structure Necrotic Amount: Large (67-100%) Fascia Exposed: No Necrotic Quality: Adherent Slough Fat Layer (Subcutaneous Tissue) Exposed: Yes Tendon Exposed: No Muscle Exposed: No Joint Exposed: No Bone Exposed: No Treatment Notes Wound #2 (Left, Lateral Lower Leg) 1. Cleanse With Wound Cleanser Soap and water 2. Periwound Care Moisturizing lotion 3. Primary Dressing Applied Collegen AG 4. Secondary Dressing ABD Pad Dry Gauze 6. Support Layer Holiday representative) Signed: 09/26/2020 5:46:00 PM By: Baruch Gouty RN, BSN Entered By: Baruch Gouty on 09/26/2020 13:01:38 -------------------------------------------------------------------------------- Wound Assessment Details Patient Name: Date of Service: Sonya Goodwin. 09/26/2020 12:30 PM Medical Record Number: [id number] Patient Account Number: [id number] Date of Birth/Sex: Treating RN: 01/08/1941 (79 y.o. [name] Primary Care Francheska Villeda: Nicholes Stairs Other Clinician: Referring Shayley Medlin: Treating Ladislaus Repsher/Extender: Nyra Jabs in Treatment: 4 Wound Status Wound Number: 3 Primary Skin Tear Etiology: Wound Location: Right, Anterior Lower Leg Secondary Venous Leg Ulcer Wounding Event: Trauma Etiology: Date Acquired: 09/24/2020 Wound Status: Open Weeks Of Treatment: 0 Comorbid Cataracts, Glaucoma, Hypertension, Type II Diabetes, Clustered Wound: No History: Osteoarthritis, Neuropathy Wound Measurements Length: (cm) 1.5 Width: (cm) 1.1 Depth: (cm) 0.1 % Reduction in Area: % Reduction in Volume: Epithelialization: Small (1-33%) Tunneling: No Undermining:  No Wound Description Classification: Full Thickness Without Exposed Support Structures Wound Margin: Flat and Intact Exudate Amount: Large Exudate Type: Serous Exudate Color: amber Foul Odor After Cleansing: No Slough/Fibrino No Wound Bed Granulation Amount: Large (67-100%) Exposed Structure Granulation Quality: Red Fascia Exposed: No Necrotic Amount: None Present (0%) Fat Layer (Subcutaneous Tissue) Exposed: Yes Tendon Exposed: No Muscle Exposed: No Joint Exposed: No Bone Exposed: No Treatment Notes Wound #3 (Right, Anterior Lower Leg) 1. Cleanse With Wound Cleanser Soap and water 2. Periwound Care Moisturizing lotion 3. Primary Dressing Applied Collegen AG 4. Secondary Dressing ABD Pad Dry Gauze 6. Support Layer Holiday representative) Signed: 09/26/2020 5:46:00 PM By: Baruch Gouty RN, BSN Entered By: Baruch Gouty on 09/26/2020 12:57:19 -------------------------------------------------------------------------------- [place] Details Patient Name: Date of Service: Sonya Pier, [name] 09/26/2020 12:30 PM Medical Record Number: [id number] Patient Account Number: [id number] Date of Birth/Sex: Treating RN: May 19, 1941 (79 y.o. [name] Primary Care Kamiah Fite: Nicholes Stairs Other Clinician: Referring Demica Zook: Treating Bijan Ridgley/Extender: Nyra Jabs in Treatment: 4 Vital Signs Time Taken: 12:42 Temperature (F): 97.9 Height (in): 66 Pulse (bpm): 119 Source: Stated Respiratory Rate (  breaths/min): 20 Weight (lbs): 141 Blood Pressure (mmHg): 163/55 Source: Stated Reference Range: 80 - 120 mg / dl Body Mass Index (BMI): 22.8 Electronic Signature(s) Signed: 09/26/2020 5:46:00 PM By: Baruch Gouty RN, BSN Entered By: Baruch Gouty on 09/26/2020 12:43:14

## 2020-09-26 NOTE — Progress Notes (Signed)
Sonya Goodwin, Sonya Goodwin ([id number]) Visit Report for 09/26/2020 HPI Details Patient Name: Date of Service: Sonya Goodwin 09/26/2020 12:30 Goodwin Medical Record Number: [id number] Patient Account Number: [id number] Date of Birth/Sex: Treating Goodwin: Sonya Goodwin (Sonya y.o. Sonya Goodwin: Sonya Goodwin Other Clinician: Referring Goodwin: Treating Goodwin/Extender: Sonya Goodwin in Treatment: 4 History of Present Illness HPI Description: ADMISSION 08/29/2020 This is a Sonya year old woman who has wounds on her left lower leg mid aspect. She states that these have been present since June when she was hospitalized with a UTI, delirium possibly medication issues. I do not see any mention of the wounds on her left leg at that time. In any case these develop sometime after this. The patient is followed by Sonya Goodwin at [facility]. She has been using bacitracin ointment to the wounds. She has [facility] home health although I am not exactly sure what they are doing. Her husband is angry about a bill from home health again we were not able to help him exactly. The patient has chronic sarcoidosis and adrenal insufficiency she has been on longstanding prednisone. She has all the classic skin features of chronic steroid use. I think this is the primary issue for these wounds. They have also noted weeping fluid coming out of the wounds and even weeping draining fluid on the right leg although we were not able to define any wound on the right leg. She could very well have a component of chronic venous insufficiency as well. Past medical history includes sarcoidosis, type 2 diabetes with a recent hemoglobin A1c of 6.3, diabetic neuropathy, stage III P chronic renal failure, hypertension, adrenal insufficiency. ABI on the right noncompressible on the left at 1.02 9/14 2 small punched out areas of the left anterior mid tibia. We have been using Iodoflex.  Better looking wound surfaces 9/28; the 2 areas on the left anterior mid tibia are about the same. She has a new weeping area on the right side just medial to the tibia. I think all of these wounds are weeping edema fluid from a collection of fluid in the upper third of her lower leg. The skin distally is more fibrosed and there is no room for weeping edema fluid. I wanted to put her in 3 layer compression versus kerlix and Coban but she will not agree to it Electronic Signature(s) Signed: 09/26/2020 5:19:28 Goodwin By: Sonya Goodwin Entered By: Sonya Goodwin on 09/26/2020 14:19:23 -------------------------------------------------------------------------------- Physical Exam Details Patient Name: Date of Service: Sonya Goodwin Medical Record Number: [id number] Patient Account Number: [id number] Date of Birth/Sex: Treating Goodwin: [birth year] (Sonya y.o. Sonya Goodwin: Sonya Goodwin Other Clinician: Referring Goodwin: Treating Goodwin/Extender: Sonya Goodwin in Treatment: 4 Constitutional Patient is hypertensive.. Pulse regular and within target range for patient.Marland Kitchen Respirations regular, non-labored and within target range.. Temperature is normal and within the target range for the patient.Marland Kitchen Appears in no distress. Cardiovascular Pedal pulses are easily palpable on both sides. Skin changes suggestive of possibly Raynaud's. Notes Wound exam; still a small circular punched out areas in the mid lower tibia on the left. These are weeping edema fluid, lifeless surfaces. Electronic Signature(s) Signed: 09/26/2020 5:19:28 Goodwin By: Sonya Goodwin Signed: 09/26/2020 5:19:28 Goodwin By: Sonya Goodwin Entered By: Sonya Goodwin on 09/26/2020 14:20:28 -------------------------------------------------------------------------------- Physician Orders Details Patient Name: Date of Service: Sonya Raider S. 09/26/2020 12:30  Goodwin Medical  Record Number: [id number] Patient Account Number: [id number] Date of Birth/Sex: Treating Goodwin: [birth year] (Sonya y.o. Sonya Goodwin: Sonya Goodwin Other Clinician: Referring Goodwin: Treating Goodwin/Extender: Sonya Goodwin in Treatment: 4 Verbal / Phone Orders: No Diagnosis Coding ICD-10 Coding Code Description [phone] Non-pressure chronic ulcer of other part of left lower leg limited to breakdown of skin I87.2 Venous insufficiency (chronic) (peripheral) Z92.241 Personal history of systemic steroid therapy Follow-up Appointments Return Appointment in 2 weeks. Dressing Change Frequency Wound #1 Left,Anterior Lower Leg Change dressing three times week. Wound #2 Left,Lateral Lower Leg Change dressing three times week. Wound #3 Right,Anterior Lower Leg Change dressing three times week. Wound Cleansing Wound #1 Left,Anterior Lower Leg Clean wound with Normal Saline. Wound #2 Left,Lateral Lower Leg Clean wound with Normal Saline. Wound #3 Right,Anterior Lower Leg Clean wound with Normal Saline. Primary Wound Dressing Wound #1 Left,Anterior Lower Leg Silver Collagen - moisten with hydrogel Wound #2 Left,Lateral Lower Leg Silver Collagen - moisten with hydrogel Wound #3 Right,Anterior Lower Leg Silver Collagen - moisten with hydrogel Secondary Dressing Wound #1 Left,Anterior Lower Leg Dry Gauze ABD pad Wound #2 Left,Lateral Lower Leg Dry Gauze ABD pad Wound #3 Right,Anterior Lower Leg Dry Gauze ABD pad Edema Control Wound #1 Left,Anterior Lower Leg Kerlix and Coban - Bilateral Wound #2 Left,Lateral Lower Leg Kerlix and Coban - Bilateral Wound #3 Right,Anterior Lower Leg Kerlix and Coban - Bilateral Home Health Wound #1 Left,Anterior Lower Leg [facility]. Sonya [facility]win Wound #2 [facility]. Sonya Goodwin Electronic Signature(s) Signed: 09/26/2020 5:19:28 Goodwin By: Sonya Goodwin Signed: 09/26/2020 5:34:55 Goodwin By: Sonya Goodwin Entered By: Sonya Coria on 09/26/2020 13:49:26 -------------------------------------------------------------------------------- Problem List Details Patient Name: Date of Service: Sonya Goodwin Medical Record Number: [id number] Patient Account Number: [id number] Date of Birth/Sex: Treating Goodwin: [birth year] (79 y.o. Sonya Goodwin: Sonya Goodwin Other Clinician: Referring Goodwin: Treating Goodwin/Extender: Sonya Goodwin in Treatment: 4 Active Problems ICD-10 Encounter Code Description Active Date MDM Diagnosis [phone] Non-pressure chronic ulcer of other part of left lower leg limited to breakdown 08/29/2020 No Yes of skin I87.2 Venous insufficiency (chronic) (peripheral) 08/29/2020 No Yes Z92.241 Personal history of systemic steroid therapy 08/29/2020 No Yes L97.819 Non-pressure chronic ulcer of other part of right lower leg with unspecified 09/26/2020 No Yes severity Inactive Problems Resolved Problems Electronic Signature(s) Signed: 09/26/2020 5:19:28 Goodwin By: Sonya Goodwin Entered By: Sonya Goodwin on 09/26/2020 14:17:45 -------------------------------------------------------------------------------- Progress Note Details Patient Name: Date of Service: Sonya Goodwin Medical Record Number: [id number] Patient Account Number: [id number] Date of Birth/Sex: Treating Goodwin: [birth year] (79 y.o. Sonya Goodwin: Sonya Goodwin Other Clinician: Referring Goodwin: Treating Goodwin/Extender: Sonya Goodwin in Treatment: 4 Subjective History of Present Illness (HPI) ADMISSION 08/29/2020 This is a 79 year old woman who has wounds on her left lower leg mid aspect. She states that these have been present  since June when she was hospitalized with a UTI, delirium possibly medication issues. I do not see any mention of the wounds on her left leg at that time. In any case these develop sometime after this. The patient is followed by Sonya Goodwin at [facility]. She has been using bacitracin ointment to the wounds. She has [facility] home health although I am not  exactly sure what they are doing. Her husband is angry about a bill from home health again we were not able to help him exactly. The patient has chronic sarcoidosis and adrenal insufficiency she has been on longstanding prednisone. She has all the classic skin features of chronic steroid use. I think this is the primary issue for these wounds. They have also noted weeping fluid coming out of the wounds and even weeping draining fluid on the right leg although we were not able to define any wound on the right leg. She could very well have a component of chronic venous insufficiency as well. Past medical history includes sarcoidosis, type 2 diabetes with a recent hemoglobin A1c of 6.3, diabetic neuropathy, stage III P chronic renal failure, hypertension, adrenal insufficiency. ABI on the right noncompressible on the left at 1.02 9/14 2 small punched out areas of the left anterior mid tibia. We have been using Iodoflex. Better looking wound surfaces 9/28; the 2 areas on the left anterior mid tibia are about the same. She has a new weeping area on the right side just medial to the tibia. I think all of these wounds are weeping edema fluid from a collection of fluid in the upper third of her lower leg. The skin distally is more fibrosed and there is no room for weeping edema fluid. I wanted to put her in 3 layer compression versus kerlix and Coban but she will not agree to it Objective Constitutional Patient is hypertensive.. Pulse regular and within target range for patient.Marland Kitchen Respirations regular, non-labored and within target range..  Temperature is normal and within the target range for the patient.Marland Kitchen Appears in no distress. Vitals Time Taken: 12:42 Goodwin, Height: 66 in, Source: Stated, Weight: 141 lbs, Source: Stated, BMI: 22.8, Temperature: 97.9 F, Pulse: 119 bpm, Respiratory Rate: 20 breaths/min, Blood Pressure: 163/55 mmHg. Cardiovascular Pedal pulses are easily palpable on both sides. Skin changes suggestive of possibly Raynaud's. General Notes: Wound exam; still a small circular punched out areas in the mid lower tibia on the left. These are weeping edema fluid, lifeless surfaces. Integumentary (Hair, Skin) Wound #1 status is Open. Original cause of wound was Gradually Appeared. The wound is located on the Left,Anterior Lower Leg. The wound measures 1.8cm length x 1.3cm width x 0.2cm depth; 1.838cm^2 area and 0.368cm^3 volume. There is Fat Layer (Subcutaneous Tissue) exposed. There is no tunneling or undermining noted. There is a medium amount of serous drainage noted. The wound margin is flat and intact. There is medium (34-66%) red granulation within the wound bed. There is a medium (34-66%) amount of necrotic tissue within the wound bed including Adherent Slough. Wound #2 status is Open. Original cause of wound was Gradually Appeared. The wound is located on the Left,Lateral Lower Leg. The wound measures 1.4cm length x 1cm width x 0.2cm depth; 1.1cm^2 area and 0.22cm^3 volume. There is Fat Layer (Subcutaneous Tissue) exposed. There is no tunneling or undermining noted. There is a medium amount of serous drainage noted. The wound margin is well defined and not attached to the wound base. There is no granulation within the wound bed. There is a large (67-100%) amount of necrotic tissue within the wound bed including Adherent Slough. Wound #3 status is Open. Original cause of wound was Trauma. The wound is located on the Right,Anterior Lower Leg. The wound measures 1.5cm length x 1.1cm width x 0.1cm depth. There is Fat Layer  (Subcutaneous Tissue) exposed. There is no tunneling or undermining noted. There is a  large amount of serous drainage noted. The wound margin is flat and intact. There is large (67-100%) red granulation within the wound bed. There is no necrotic tissue within the wound bed. Assessment Active Problems ICD-10 Non-pressure chronic ulcer of other part of left lower leg limited to breakdown of skin Venous insufficiency (chronic) (peripheral) Personal history of systemic steroid therapy Non-pressure chronic ulcer of other part of right lower leg with unspecified severity Procedures Wound #1 Pre-procedure diagnosis of Wound #1 is a Venous Leg Ulcer located on the Left,Anterior Lower Leg . There was a Double Layer Compression Therapy Procedure by Sonya Coria, Goodwin. Post procedure Diagnosis Wound #1: Same as Pre-Procedure Wound #2 Pre-procedure diagnosis of Wound #2 is a Diabetic Wound/Ulcer of the Lower Extremity located on the Left,Lateral Lower Leg . There was a Double Layer Compression Therapy Procedure by Sonya Coria, Goodwin. Post procedure Diagnosis Wound #2: Same as Pre-Procedure Wound #3 Pre-procedure diagnosis of Wound #3 is a Skin T located on the Right,Anterior Lower Leg . There was a Double Layer Compression Therapy Procedure by ear Sonya Coria, Goodwin. Post procedure Diagnosis Wound #3: Same as Pre-Procedure Plan Follow-up Appointments: Return Appointment in 2 weeks. Dressing Change Frequency: Wound #1 Left,Anterior Lower Leg: Change dressing three times week. Wound #2 Left,Lateral Lower Leg: Change dressing three times week. Wound #3 Right,Anterior Lower Leg: Change dressing three times week. Wound Cleansing: Wound #1 Left,Anterior Lower Leg: Clean wound with Normal Saline. Wound #2 Left,Lateral Lower Leg: Clean wound with Normal Saline. Wound #3 Right,Anterior Lower Leg: Clean wound with Normal Saline. Primary Wound Dressing: Wound #1 Left,Anterior Lower Leg: Silver Collagen  - moisten with hydrogel Wound #2 Left,Lateral Lower Leg: Silver Collagen - moisten with hydrogel Wound #3 Right,Anterior Lower Leg: Silver Collagen - moisten with hydrogel Secondary Dressing: Wound #1 Left,Anterior Lower Leg: Dry Gauze ABD pad Wound #2 Left,Lateral Lower Leg: Dry Gauze ABD pad Wound #3 Right,Anterior Lower Leg: Dry Gauze ABD pad Edema Control: Wound #1 Left,Anterior Lower Leg: Kerlix and Coban - Bilateral Wound #2 Left,Lateral Lower Leg: Kerlix and Coban - Bilateral Wound #3 Right,Anterior Lower Leg: Kerlix and Coban - Bilateral Home Health: Wound #1 Left,Anterior Lower Leg: [facility] skilled [facility] for wound care. [facility] Wound #2 Left,Lateral Lower Leg: [facility] skilled [facility] for wound care. - [facility] 1. Change the primary dressing to silver collagen 2. ABDs under kerlix and Coban 3. I wanted to put her in 3 layer compression to see if we can control the edema better however she would not allow it. 4. There is still too much fluid weeping out of the wound areas for these to even look close to healing. 5. The patient is on 8 mg of prednisone much of her skin changes related to this Electronic Signature(s) Signed: 09/26/2020 5:19:28 Goodwin By: Sonya Goodwin Entered By: Sonya Goodwin on 09/26/2020 14:22:02 -------------------------------------------------------------------------------- SuperBill Details Patient Name: Date of Service: Sonya Chambers 09/26/2020 Medical Record Number: [id number] Patient Account Number: [id number] Date of Birth/Sex: Treating Goodwin: [birth year] (79 y.o. Sonya Goodwin: Sonya Goodwin Other Clinician: Referring Goodwin: Treating Goodwin/Extender: Sonya Goodwin in Treatment: 4 Diagnosis Coding ICD-10 Codes Code Description [phone] Non-pressure chronic ulcer of other part of left lower leg limited to breakdown of skin I87.2 Venous insufficiency  (chronic) (peripheral) Z92.241 Personal history of systemic steroid therapy L97.819 Non-pressure chronic ulcer of other part of right lower leg with unspecified severity Facility Procedures CPT4: Code [id number]  foo Description: 81 BILATERAL: Application of multi-layer venous compression system; leg (below knee), including ankle and t. Modifier: Quantity: 1 Physician Procedures : CPT4 Code Description Modifier [id number] - WC PHYS LEVEL 3 - EST PT ICD-10 Diagnosis Description L97.821 Non-pressure chronic ulcer of other part of left lower leg limited to breakdown of skin L97.819 Non-pressure chronic ulcer of other part of  right lower leg with unspecified severity I87.2 Venous insufficiency (chronic) (peripheral) Z92.241 Personal history of systemic steroid therapy Quantity: 1 Electronic Signature(s) Signed: 09/26/2020 5:19:28 Goodwin By: Sonya Goodwin Entered By: Sonya Goodwin on 09/26/2020 14:22:33

## 2020-09-27 DIAGNOSIS — L89891 Pressure ulcer of other site, stage 1: Secondary | ICD-10-CM | POA: Diagnosis not present

## 2020-09-27 DIAGNOSIS — E2740 Unspecified adrenocortical insufficiency: Secondary | ICD-10-CM | POA: Diagnosis not present

## 2020-09-27 DIAGNOSIS — S81812A Laceration without foreign body, left lower leg, initial encounter: Secondary | ICD-10-CM | POA: Diagnosis not present

## 2020-09-27 DIAGNOSIS — E1143 Type 2 diabetes mellitus with diabetic autonomic (poly)neuropathy: Secondary | ICD-10-CM | POA: Diagnosis not present

## 2020-09-27 DIAGNOSIS — E1142 Type 2 diabetes mellitus with diabetic polyneuropathy: Secondary | ICD-10-CM | POA: Diagnosis not present

## 2020-09-27 DIAGNOSIS — D869 Sarcoidosis, unspecified: Secondary | ICD-10-CM | POA: Diagnosis not present

## 2020-09-27 DIAGNOSIS — M1710 Unilateral primary osteoarthritis, unspecified knee: Secondary | ICD-10-CM | POA: Diagnosis not present

## 2020-09-27 DIAGNOSIS — N1831 Chronic kidney disease, stage 3a: Secondary | ICD-10-CM | POA: Diagnosis not present

## 2020-09-27 DIAGNOSIS — E1122 Type 2 diabetes mellitus with diabetic chronic kidney disease: Secondary | ICD-10-CM | POA: Diagnosis not present

## 2020-09-27 DIAGNOSIS — I129 Hypertensive chronic kidney disease with stage 1 through stage 4 chronic kidney disease, or unspecified chronic kidney disease: Secondary | ICD-10-CM | POA: Diagnosis not present

## 2020-09-27 DIAGNOSIS — G2581 Restless legs syndrome: Secondary | ICD-10-CM | POA: Diagnosis not present

## 2020-09-29 DIAGNOSIS — E1142 Type 2 diabetes mellitus with diabetic polyneuropathy: Secondary | ICD-10-CM | POA: Diagnosis not present

## 2020-09-29 DIAGNOSIS — I129 Hypertensive chronic kidney disease with stage 1 through stage 4 chronic kidney disease, or unspecified chronic kidney disease: Secondary | ICD-10-CM | POA: Diagnosis not present

## 2020-09-29 DIAGNOSIS — E2740 Unspecified adrenocortical insufficiency: Secondary | ICD-10-CM | POA: Diagnosis not present

## 2020-09-29 DIAGNOSIS — N1831 Chronic kidney disease, stage 3a: Secondary | ICD-10-CM | POA: Diagnosis not present

## 2020-09-29 DIAGNOSIS — G2581 Restless legs syndrome: Secondary | ICD-10-CM | POA: Diagnosis not present

## 2020-09-29 DIAGNOSIS — L89891 Pressure ulcer of other site, stage 1: Secondary | ICD-10-CM | POA: Diagnosis not present

## 2020-09-29 DIAGNOSIS — E1122 Type 2 diabetes mellitus with diabetic chronic kidney disease: Secondary | ICD-10-CM | POA: Diagnosis not present

## 2020-09-29 DIAGNOSIS — D869 Sarcoidosis, unspecified: Secondary | ICD-10-CM | POA: Diagnosis not present

## 2020-09-29 DIAGNOSIS — M1710 Unilateral primary osteoarthritis, unspecified knee: Secondary | ICD-10-CM | POA: Diagnosis not present

## 2020-09-29 DIAGNOSIS — E1143 Type 2 diabetes mellitus with diabetic autonomic (poly)neuropathy: Secondary | ICD-10-CM | POA: Diagnosis not present

## 2020-10-02 DIAGNOSIS — E1142 Type 2 diabetes mellitus with diabetic polyneuropathy: Secondary | ICD-10-CM | POA: Diagnosis not present

## 2020-10-02 DIAGNOSIS — E1122 Type 2 diabetes mellitus with diabetic chronic kidney disease: Secondary | ICD-10-CM | POA: Diagnosis not present

## 2020-10-02 DIAGNOSIS — I129 Hypertensive chronic kidney disease with stage 1 through stage 4 chronic kidney disease, or unspecified chronic kidney disease: Secondary | ICD-10-CM | POA: Diagnosis not present

## 2020-10-02 DIAGNOSIS — G2581 Restless legs syndrome: Secondary | ICD-10-CM | POA: Diagnosis not present

## 2020-10-02 DIAGNOSIS — D869 Sarcoidosis, unspecified: Secondary | ICD-10-CM | POA: Diagnosis not present

## 2020-10-02 DIAGNOSIS — N1831 Chronic kidney disease, stage 3a: Secondary | ICD-10-CM | POA: Diagnosis not present

## 2020-10-02 DIAGNOSIS — M1710 Unilateral primary osteoarthritis, unspecified knee: Secondary | ICD-10-CM | POA: Diagnosis not present

## 2020-10-02 DIAGNOSIS — E2740 Unspecified adrenocortical insufficiency: Secondary | ICD-10-CM | POA: Diagnosis not present

## 2020-10-02 DIAGNOSIS — E1143 Type 2 diabetes mellitus with diabetic autonomic (poly)neuropathy: Secondary | ICD-10-CM | POA: Diagnosis not present

## 2020-10-02 DIAGNOSIS — L89891 Pressure ulcer of other site, stage 1: Secondary | ICD-10-CM | POA: Diagnosis not present

## 2020-10-03 DIAGNOSIS — S81812A Laceration without foreign body, left lower leg, initial encounter: Secondary | ICD-10-CM | POA: Diagnosis not present

## 2020-10-04 DIAGNOSIS — N1831 Chronic kidney disease, stage 3a: Secondary | ICD-10-CM | POA: Diagnosis not present

## 2020-10-04 DIAGNOSIS — I129 Hypertensive chronic kidney disease with stage 1 through stage 4 chronic kidney disease, or unspecified chronic kidney disease: Secondary | ICD-10-CM | POA: Diagnosis not present

## 2020-10-04 DIAGNOSIS — E1142 Type 2 diabetes mellitus with diabetic polyneuropathy: Secondary | ICD-10-CM | POA: Diagnosis not present

## 2020-10-04 DIAGNOSIS — D869 Sarcoidosis, unspecified: Secondary | ICD-10-CM | POA: Diagnosis not present

## 2020-10-04 DIAGNOSIS — L89891 Pressure ulcer of other site, stage 1: Secondary | ICD-10-CM | POA: Diagnosis not present

## 2020-10-04 DIAGNOSIS — E2740 Unspecified adrenocortical insufficiency: Secondary | ICD-10-CM | POA: Diagnosis not present

## 2020-10-04 DIAGNOSIS — E1122 Type 2 diabetes mellitus with diabetic chronic kidney disease: Secondary | ICD-10-CM | POA: Diagnosis not present

## 2020-10-04 DIAGNOSIS — G2581 Restless legs syndrome: Secondary | ICD-10-CM | POA: Diagnosis not present

## 2020-10-04 DIAGNOSIS — E1143 Type 2 diabetes mellitus with diabetic autonomic (poly)neuropathy: Secondary | ICD-10-CM | POA: Diagnosis not present

## 2020-10-04 DIAGNOSIS — M1710 Unilateral primary osteoarthritis, unspecified knee: Secondary | ICD-10-CM | POA: Diagnosis not present

## 2020-10-06 DIAGNOSIS — M1710 Unilateral primary osteoarthritis, unspecified knee: Secondary | ICD-10-CM | POA: Diagnosis not present

## 2020-10-06 DIAGNOSIS — L89891 Pressure ulcer of other site, stage 1: Secondary | ICD-10-CM | POA: Diagnosis not present

## 2020-10-06 DIAGNOSIS — E1122 Type 2 diabetes mellitus with diabetic chronic kidney disease: Secondary | ICD-10-CM | POA: Diagnosis not present

## 2020-10-06 DIAGNOSIS — E1142 Type 2 diabetes mellitus with diabetic polyneuropathy: Secondary | ICD-10-CM | POA: Diagnosis not present

## 2020-10-06 DIAGNOSIS — I129 Hypertensive chronic kidney disease with stage 1 through stage 4 chronic kidney disease, or unspecified chronic kidney disease: Secondary | ICD-10-CM | POA: Diagnosis not present

## 2020-10-06 DIAGNOSIS — D869 Sarcoidosis, unspecified: Secondary | ICD-10-CM | POA: Diagnosis not present

## 2020-10-06 DIAGNOSIS — G2581 Restless legs syndrome: Secondary | ICD-10-CM | POA: Diagnosis not present

## 2020-10-06 DIAGNOSIS — E2740 Unspecified adrenocortical insufficiency: Secondary | ICD-10-CM | POA: Diagnosis not present

## 2020-10-06 DIAGNOSIS — E1143 Type 2 diabetes mellitus with diabetic autonomic (poly)neuropathy: Secondary | ICD-10-CM | POA: Diagnosis not present

## 2020-10-06 DIAGNOSIS — N1831 Chronic kidney disease, stage 3a: Secondary | ICD-10-CM | POA: Diagnosis not present

## 2020-10-09 DIAGNOSIS — L89891 Pressure ulcer of other site, stage 1: Secondary | ICD-10-CM | POA: Diagnosis not present

## 2020-10-09 DIAGNOSIS — D869 Sarcoidosis, unspecified: Secondary | ICD-10-CM | POA: Diagnosis not present

## 2020-10-09 DIAGNOSIS — E1143 Type 2 diabetes mellitus with diabetic autonomic (poly)neuropathy: Secondary | ICD-10-CM | POA: Diagnosis not present

## 2020-10-09 DIAGNOSIS — M1710 Unilateral primary osteoarthritis, unspecified knee: Secondary | ICD-10-CM | POA: Diagnosis not present

## 2020-10-09 DIAGNOSIS — I129 Hypertensive chronic kidney disease with stage 1 through stage 4 chronic kidney disease, or unspecified chronic kidney disease: Secondary | ICD-10-CM | POA: Diagnosis not present

## 2020-10-09 DIAGNOSIS — E2740 Unspecified adrenocortical insufficiency: Secondary | ICD-10-CM | POA: Diagnosis not present

## 2020-10-09 DIAGNOSIS — E1142 Type 2 diabetes mellitus with diabetic polyneuropathy: Secondary | ICD-10-CM | POA: Diagnosis not present

## 2020-10-09 DIAGNOSIS — N1831 Chronic kidney disease, stage 3a: Secondary | ICD-10-CM | POA: Diagnosis not present

## 2020-10-09 DIAGNOSIS — G2581 Restless legs syndrome: Secondary | ICD-10-CM | POA: Diagnosis not present

## 2020-10-09 DIAGNOSIS — E1122 Type 2 diabetes mellitus with diabetic chronic kidney disease: Secondary | ICD-10-CM | POA: Diagnosis not present

## 2020-10-10 ENCOUNTER — Encounter (HOSPITAL_BASED_OUTPATIENT_CLINIC_OR_DEPARTMENT_OTHER): Payer: Medicare HMO | Attending: Internal Medicine | Admitting: Internal Medicine

## 2020-10-10 ENCOUNTER — Other Ambulatory Visit: Payer: Self-pay

## 2020-10-10 DIAGNOSIS — L97819 Non-pressure chronic ulcer of other part of right lower leg with unspecified severity: Secondary | ICD-10-CM | POA: Diagnosis not present

## 2020-10-10 DIAGNOSIS — I872 Venous insufficiency (chronic) (peripheral): Secondary | ICD-10-CM | POA: Diagnosis not present

## 2020-10-10 DIAGNOSIS — L97821 Non-pressure chronic ulcer of other part of left lower leg limited to breakdown of skin: Secondary | ICD-10-CM | POA: Diagnosis not present

## 2020-10-10 NOTE — Progress Notes (Signed)
Sonya Goodwin, Sonya Goodwin ([id number]) Visit Report for 10/10/2020 Arrival Information Details Patient Name: Date of Service: [place], [name] 10/10/2020 12:30 PM Medical Record Number: [id number] Patient Account Number: [id number] Date of Birth/Sex: Treating RN: [birth year] (79 y.o. [name] Primary Care Floetta Brickey: Nicholes Stairs Other Clinician: Referring Saliyah Gillin: Treating Keyani Rigdon/Extender: Nyra Jabs in Treatment: 6 Visit Information History Since Last Visit Added or deleted any medications: No Patient Arrived: Wheel Chair Any new allergies or adverse reactions: No Arrival Time: 12:33 Had a fall or experienced change in No Accompanied By: spouse activities of daily living that may affect Transfer Assistance: None risk of falls: Patient Identification Verified: Yes Signs or symptoms of abuse/neglect since last visito No Secondary Verification Process Completed: Yes Hospitalized since last visit: No Patient Requires Transmission-Based Precautions: No Implantable device outside of the clinic excluding No Patient Has Alerts: No cellular tissue based products placed in the center since last visit: Has Dressing in Place as Prescribed: Yes Has Compression in Place as Prescribed: Yes Pain Present Now: Yes Electronic Signature(s) Signed: 10/10/2020 4:37:34 PM By: Baruch Gouty RN, BSN Entered By: Baruch Gouty on 10/10/2020 12:39:46 -------------------------------------------------------------------------------- Compression Therapy Details Patient Name: Date of Service: Sonya Goodwin. 10/10/2020 12:30 PM Medical Record Number: [id number] Patient Account Number: [id number] Date of Birth/Sex: Treating RN: 1941/02/27 (79 y.o. [name] Primary Care Marcia Hartwell: Nicholes Stairs Other Clinician: Referring Tuleen Mandelbaum: Treating Theran Vandergrift/Extender: Nyra Jabs in Treatment: 6 Compression Therapy Performed for  Wound Assessment: Wound #1 Left,Anterior Lower Leg Performed By: Clinician Carlene Coria, RN Compression Type: Double Layer Post Procedure Diagnosis Same as Pre-procedure Electronic Signature(s) Signed: 10/10/2020 5:49:00 PM By: Carlene Coria RN Entered By: Carlene Coria on 10/10/2020 13:34:19 -------------------------------------------------------------------------------- Compression Therapy Details Patient Name: Date of Service: Sonya Goodwin, Sonya Goodwin 10/10/2020 12:30 PM Medical Record Number: [id number] Patient Account Number: [id number] Date of Birth/Sex: Treating RN: January 16, 1941 (79 y.o. [name] Primary Care Dayona Shaheen: Nicholes Stairs Other Clinician: Referring Jessicca Stitzer: Treating Theadora Noyes/Extender: Nyra Jabs in Treatment: 6 Compression Therapy Performed for Wound Assessment: Wound #2 Left,Lateral Lower Leg Performed By: Clinician Carlene Coria, RN Compression Type: Double Layer Post Procedure Diagnosis Same as Pre-procedure Electronic Signature(s) Signed: 10/10/2020 5:49:00 PM By: Carlene Coria RN Entered By: Carlene Coria on 10/10/2020 13:34:19 -------------------------------------------------------------------------------- Compression Therapy Details Patient Name: Date of Service: Sonya Goodwin, Sonya Goodwin 10/10/2020 12:30 PM Medical Record Number: [id number] Patient Account Number: [id number] Date of Birth/Sex: Treating RN: [birth year] (79 y.o. [name] Primary Care Destony Prevost: Nicholes Stairs Other Clinician: Referring Anmol Paschen: Treating Issiah Huffaker/Extender: Nyra Jabs in Treatment: 6 Compression Therapy Performed for Wound Assessment: Wound #3 Right,Anterior Lower Leg Performed By: Clinician Carlene Coria, RN Compression Type: Double Layer Post Procedure Diagnosis Same as Pre-procedure Electronic Signature(s) Signed: 10/10/2020 5:49:00 PM By: Carlene Coria RN Entered By: Carlene Coria on 10/10/2020  13:34:19 -------------------------------------------------------------------------------- Compression Therapy Details Patient Name: Date of Service: Sonya Goodwin, Sonya Goodwin 10/10/2020 12:30 PM Medical Record Number: [id number] Patient Account Number: [id number] Date of Birth/Sex: Treating RN: December 08, 1941 (79 y.o. [name] Primary Care Jimmie Dattilio: Nicholes Stairs Other Clinician: Referring Clifton Kovacic: Treating Ariyanna Oien/Extender: Nyra Jabs in Treatment: 6 Compression Therapy Performed for Wound Assessment: Wound #4 Left,Proximal,Lateral Lower Leg Performed By: Clinician Carlene Coria, RN Compression Type: Double Layer Post Procedure Diagnosis Same as Pre-procedure Electronic Signature(s) Signed: 10/10/2020 5:49:00 PM By: Carlene Coria RN Entered By: Carlene Coria on  10/10/2020 13:34:20 -------------------------------------------------------------------------------- Compression Therapy Details Patient Name: Date of Service: Sonya Goodwin, Sonya Goodwin 10/10/2020 12:30 PM Medical Record Number: [id number] Patient Account Number: [id number] Date of Birth/Sex: Treating RN: [birth year] (79 y.o. [name] Primary Care Gayl Ivanoff: Nicholes Stairs Other Clinician: Referring Antwone Capozzoli: Treating Emmanuelle Hibbitts/Extender: Nyra Jabs in Treatment: 6 Compression Therapy Performed for Wound Assessment: Wound #5 Right,Lateral Lower Leg Performed By: Clinician Carlene Coria, RN Compression Type: Double Layer Post Procedure Diagnosis Same as Pre-procedure Electronic Signature(s) Signed: 10/10/2020 5:49:00 PM By: Carlene Coria RN Entered By: Carlene Coria on 10/10/2020 13:34:20 -------------------------------------------------------------------------------- Encounter Discharge Information Details Patient Name: Date of Service: Sonya Goodwin. 10/10/2020 12:30 PM Medical Record Number: [id number] Patient Account Number: [id number] Date of  Birth/Sex: Treating RN: [birth year] (79 y.o. Helene Shoe, Tammi Klippel Primary Care Remi Rester: Nicholes Stairs Other Clinician: Referring Wilfred Siverson: Treating Lan Entsminger/Extender: Nyra Jabs in Treatment: 6 Encounter Discharge Information Items Discharge Condition: Stable Ambulatory Status: Wheelchair Discharge Destination: Home Transportation: Private Auto Accompanied By: family member Schedule Follow-up Appointment: Yes Clinical Summary of Care: Electronic Signature(s) Signed: 10/10/2020 5:27:04 PM By: Deon Pilling Entered By: Deon Pilling on 10/10/2020 13:40:30 -------------------------------------------------------------------------------- Lower Extremity Assessment Details Patient Name: Date of Service: Sonya Goodwin, Sonya Goodwin 10/10/2020 12:30 PM Medical Record Number: [id number] Patient Account Number: [id number] Date of Birth/Sex: Treating RN: 1941/05/06 (79 y.o. [name] Primary Care Urbano Milhouse: Nicholes Stairs Other Clinician: Referring Tressa Maldonado: Treating Hani Patnode/Extender: Nyra Jabs in Treatment: 6 Edema Assessment Assessed: [name]: No] [name]: No] Edema: [Left: Yes] [Right: Yes] Calf Left: Right: Point of Measurement: From Medial Instep 21 cm 23 cm Ankle Left: Right: Point of Measurement: From Medial Instep 14.8 cm 17.4 cm Vascular Assessment Pulses: Dorsalis Pedis Palpable: [Left:Yes] [Right:Yes] Electronic Signature(s) Signed: 10/10/2020 4:37:34 PM By: Baruch Gouty RN, BSN Entered By: Baruch Gouty on 10/10/2020 12:51:58 -------------------------------------------------------------------------------- Multi Wound Chart Details Patient Name: Date of Service: Sonya Goodwin. 10/10/2020 12:30 PM Medical Record Number: [id number] Patient Account Number: [id number] Date of Birth/Sex: Treating RN: [birth year] (79 y.o. [name] Primary Care Layia Walla: Nicholes Stairs Other Clinician: Referring  Frederico Gerling: Treating Athziri Freundlich/Extender: Nyra Jabs in Treatment: 6 Vital Signs Height(in): 74 Pulse(bpm): 110 Weight(lbs): 141 Blood Pressure(mmHg): 159/72 Body Mass Index(BMI): 23 Temperature(F): 98 Respiratory Rate(breaths/min): 18 Photos: [1:No Photos Left, Anterior Lower Leg] [2:No Photos Left, Lateral Lower Leg] [3:No Photos Right, Anterior Lower Leg] Wound Location: [1:Gradually Appeared] [2:Gradually Appeared] [3:Trauma] Wounding Event: [1:Venous Leg Ulcer] [2:Diabetic Wound/Ulcer of the Lower] [3:Skin Tear] Primary Etiology: [1:N/A] [2:Extremity Venous Leg Ulcer] [3:Venous Leg Ulcer] Secondary Etiology: [1:Cataracts, Glaucoma, Hypertension, Cataracts, Glaucoma, Hypertension,] [3:Cataracts, Glaucoma, Hypertension,] Comorbid History: [1:Type II Diabetes, Osteoarthritis, Type II Diabetes, Osteoarthritis, Neuropathy 06/15/2020] [2:Neuropathy 06/15/2020] [3:Type II Diabetes, Osteoarthritis, Neuropathy 09/24/2020] Date Acquired: [1:6] [2:6] [3:2] Weeks of Treatment: [1:Open] [2:Open] [3:Open] Wound Status: [1:1.4x0.8x0.2] [2:1.7x0.7x0.2] [3:1.5x0.8x0.1] Measurements L x W x D (cm) [1:0.88] [2:0.935] [3:0.942] A (cm) : rea [1:0.176] [2:0.187] [3:0.094] Volume (cm) : [1:37.80%] [2:-21.40%] [3:N/A] % Reduction in Area: [1:-24.80%] [2:-142.90%] [3:N/A] % Reduction in Volume: [1:10] [2:12] Starting Position 1 (o'clock): [1:5] [2:12] Ending Position 1 (o'clock): [1:0.3] [2:0.8] Maximum Distance 1 (cm): [1:Yes] [2:Yes] [3:No] Undermining: [1:Full Thickness Without Exposed] [2:Grade 1] [3:Full Thickness Without Exposed] Classification: [1:Support Structures Medium] [2:Medium] [3:Support Structures Medium] Exudate Amount: [1:Serosanguineous] [2:Serous] [3:Serous] Exudate Type: [1:red, brown] [2:amber] [3:amber] Exudate Color: [1:Flat and Intact] [2:Well defined, not attached] [3:Flat and Intact] Wound Margin: [1:Medium (34-66%)] [2:None  Present (0%)] [3:Medium  (34-66%)] Granulation Amount: [1:Red] [2:N/A] [3:Red] Granulation Quality: [1:Medium (34-66%)] [2:Large (67-100%)] [3:Medium (34-66%)] Necrotic Amount: [1:Fat Layer (Subcutaneous Tissue): Yes Fat Layer (Subcutaneous Tissue): Yes Fat Layer (Subcutaneous Tissue): Yes] Exposed Structures: [1:Fascia: No Tendon: No Muscle: No Joint: No Bone: No None] [2:Fascia: No Tendon: No Muscle: No Joint: No Bone: No None] [3:Fascia: No Tendon: No Muscle: No Joint: No Bone: No Small (1-33%)] Wound Number: 4 5 N/A Photos: No Photos No Photos N/A Left, Proximal, Lateral Lower Leg Right, Lateral Lower Leg N/A Wound Location: Trauma Trauma N/A Wounding Event: Skin T ear Skin T ear N/A Primary Etiology: N/A N/A N/A Secondary Etiology: Cataracts, Glaucoma, Hypertension, Cataracts, Glaucoma, Hypertension, N/A Comorbid History: Type II Diabetes, Osteoarthritis, Type II Diabetes, Osteoarthritis, Neuropathy Neuropathy 10/10/2020 10/03/2020 N/A Date Acquired: 0 0 N/A Weeks of Treatment: Open Open N/A Wound Status: 1.8x0.4x0.1 0.8x1x0.1 N/A Measurements L x W x D (cm) 0.565 0.628 N/A A (cm) : rea 0.057 0.063 N/A Volume (cm) : N/A N/A N/A % Reduction in Area: N/A N/A N/A % Reduction in Volume: No No N/A Undermining: Full Thickness Without Exposed Full Thickness Without Exposed N/A Classification: Support Structures Support Structures Small Small N/A Exudate Amount: Serosanguineous Serosanguineous N/A Exudate Type: red, brown red, brown N/A Exudate Color: Flat and Intact Flat and Intact N/A Wound Margin: Large (67-100%) Large (67-100%) N/A Granulation Amount: Red Red N/A Granulation Quality: None Present (0%) None Present (0%) N/A Necrotic Amount: Fat Layer (Subcutaneous Tissue): Yes Fat Layer (Subcutaneous Tissue): Yes N/A Exposed Structures: Fascia: No Fascia: No Tendon: No Tendon: No Muscle: No Muscle: No Joint: No Joint: No Bone: No Bone: No Small (1-33%) Small (1-33%)  N/A Epithelialization: Treatment Notes Electronic Signature(s) Signed: 10/10/2020 5:41:11 PM By: Linton Ham MD Signed: 10/10/2020 5:49:00 PM By: Carlene Coria RN Entered By: Linton Ham on 10/10/2020 13:30:16 -------------------------------------------------------------------------------- Multi-Disciplinary Care Plan Details Patient Name: Date of Service: Sonya Goodwin, Sonya Goodwin. 10/10/2020 12:30 PM Medical Record Number: [id number] Patient Account Number: [id number] Date of Birth/Sex: Treating RN: 12-16-1941 (79 y.o. [name] Primary Care Tylie Golonka: Nicholes Stairs Other Clinician: Referring Marcia Hartwell: Treating Iyesha Such/Extender: Nyra Jabs in Treatment: 6 Active Inactive Wound/Skin Impairment Nursing Diagnoses: Knowledge deficit related to ulceration/compromised skin integrity Goals: Patient/caregiver will verbalize understanding of skin care regimen Date Initiated: 08/29/2020 Target Resolution Date: 10/28/2020 Goal Status: Active Ulcer/skin breakdown will have a volume reduction of 30% by week 4 Date Initiated: 08/29/2020 Date Inactivated: 09/26/2020 Target Resolution Date: 09/28/2020 Goal Status: Unmet Unmet Reason: comorbities Ulcer/skin breakdown will have a volume reduction of 50% by week 8 Date Initiated: 09/26/2020 Target Resolution Date: 10/28/2020 Goal Status: Active Interventions: Assess patient/caregiver ability to obtain necessary supplies Assess patient/caregiver ability to perform ulcer/skin care regimen upon admission and as needed Assess ulceration(s) every visit Notes: Electronic Signature(s) Signed: 10/10/2020 5:49:00 PM By: Carlene Coria RN Entered By: Carlene Coria on 10/10/2020 12:49:22 -------------------------------------------------------------------------------- Pain Assessment Details Patient Name: Date of Service: Sonya Goodwin, Sonya Goodwin 10/10/2020 12:30 PM Medical Record Number: [id number] Patient Account  Number: [id number] Date of Birth/Sex: Treating RN: 1941/06/04 (79 y.o. [name] Primary Care Serin Thornell: Nicholes Stairs Other Clinician: Referring Monseratt Ledin: Treating Liesa Tsan/Extender: Nyra Jabs in Treatment: 6 Active Problems Location of Pain Severity and Description of Pain Patient Has Paino Yes Site Locations Pain Location: Pain in Ulcers With Dressing Change: Yes Duration of the Pain. Constant / Intermittento Constant Rate the pain. Current Pain Level: 8 Worst Pain Level: 10  Least Pain Level: 2 Character of Pain Describe the Pain: Aching Pain Management and Medication Current Pain Management: Medication: Yes Is the Current Pain Management Adequate: Adequate Rest: Yes How does your wound impact your activities of daily livingo Sleep: Yes Bathing: No Appetite: No Relationship With Others: No Bladder Continence: No Emotions: Yes Bowel Continence: No Hobbies: No Toileting: No Dressing: No Electronic Signature(s) Signed: 10/10/2020 4:37:34 PM By: Baruch Gouty RN, BSN Entered By: Baruch Gouty on 10/10/2020 12:50:42 -------------------------------------------------------------------------------- Patient/Caregiver Education Details Patient Name: Date of Service: Sonya Goodwin 10/12/2021andnbsp12:30 PM Medical Record Number: [id number] Patient Account Number: [id number] Date of Birth/Gender: Treating RN: [birth year] (79 y.o. [name] Primary Care Physician: Nicholes Stairs Other Clinician: Referring Physician: Treating Physician/Extender: Nyra Jabs in Treatment: 6 Education Assessment Education Provided To: Patient Education Topics Provided Wound/Skin Impairment: Methods: Explain/Verbal Responses: State content correctly Electronic Signature(s) Signed: 10/10/2020 5:49:00 PM By: Carlene Coria RN Entered By: Carlene Coria on 10/10/2020  12:49:44 -------------------------------------------------------------------------------- Wound Assessment Details Patient Name: Date of Service: Sonya Goodwin, Sonya Goodwin 10/10/2020 12:30 PM Medical Record Number: [id number] Patient Account Number: [id number] Date of Birth/Sex: Treating RN: 10/07/1941 (78 y.o. [name] Primary Care Lynne Takemoto: Nicholes Stairs Other Clinician: Referring Ashdon Gillson: Treating Vasco Chong/Extender: Nyra Jabs in Treatment: 6 Wound Status Wound Number: 1 Primary Venous Leg Ulcer Etiology: Wound Location: Left, Anterior Lower Leg Wound Open Wounding Event: Gradually Appeared Status: Date Acquired: 06/15/2020 Comorbid Cataracts, Glaucoma, Hypertension, Type II Diabetes, Weeks Of Treatment: 6 History: Osteoarthritis, Neuropathy Clustered Wound: No Wound Measurements Length: (cm) 1.4 Width: (cm) 0.8 Depth: (cm) 0.2 Area: (cm) 0.88 Volume: (cm) 0.176 % Reduction in Area: 37.8% % Reduction in Volume: -24.8% Epithelialization: None Tunneling: No Undermining: Yes Starting Position (o'clock): 10 Ending Position (o'clock): 5 Maximum Distance: (cm) 0.3 Wound Description Classification: Full Thickness Without Exposed Support Structures Wound Margin: Flat and Intact Exudate Amount: Medium Exudate Type: Serosanguineous Exudate Color: red, brown Foul Odor After Cleansing: No Slough/Fibrino Yes Wound Bed Granulation Amount: Medium (34-66%) Exposed Structure Granulation Quality: Red Fascia Exposed: No Necrotic Amount: Medium (34-66%) Fat Layer (Subcutaneous Tissue) Exposed: Yes Necrotic Quality: Adherent Slough Tendon Exposed: No Muscle Exposed: No Joint Exposed: No Bone Exposed: No Treatment Notes Wound #1 (Left, Anterior Lower Leg) 1. Cleanse With Wound Cleanser Soap and water 2. Periwound Care Moisturizing lotion 3. Primary Dressing Applied Calcium Alginate Ag 4. Secondary Dressing ABD Pad Dry Gauze 6.  Support Layer Applied Kerlix/Coban Notes netting. Electronic Signature(s) Signed: 10/10/2020 4:37:34 PM By: Baruch Gouty RN, BSN Entered By: Baruch Gouty on 10/10/2020 13:03:16 -------------------------------------------------------------------------------- Wound Assessment Details Patient Name: Date of Service: Sonya Goodwin. 10/10/2020 12:30 PM Medical Record Number: [id number] Patient Account Number: [id number] Date of Birth/Sex: Treating RN: 04-12-1941 (79 y.o. [name] Primary Care Anne Sebring: Nicholes Stairs Other Clinician: Referring Quinn Bartling: Treating Zarius Furr/Extender: Nyra Jabs in Treatment: 6 Wound Status Wound Number: 2 Primary Diabetic Wound/Ulcer of the Lower Extremity Etiology: Wound Location: Left, Lateral Lower Leg Secondary Venous Leg Ulcer Wounding Event: Gradually Appeared Wounding Event: Gradually Appeared Etiology: Date Acquired: 06/15/2020 Wound Status: Open Weeks Of Treatment: 6 Comorbid Cataracts, Glaucoma, Hypertension, Type II Diabetes, Clustered Wound: No History: Osteoarthritis, Neuropathy Wound Measurements Length: (cm) 1.7 Width: (cm) 0.7 Depth: (cm) 0.2 Area: (cm) 0.935 Volume: (cm) 0.187 % Reduction in Area: -21.4% % Reduction in Volume: -142.9% Epithelialization: None Tunneling: No Undermining: Yes Starting Position (o'clock): 12 Ending Position (o'clock): 12 Maximum  Distance: (cm) 0.8 Wound Description Classification: Grade 1 Wound Margin: Well defined, not attached Exudate Amount: Medium Exudate Type: Serous Exudate Color: amber Foul Odor After Cleansing: No Slough/Fibrino Yes Wound Bed Granulation Amount: None Present (0%) Exposed Structure Necrotic Amount: Large (67-100%) Fascia Exposed: No Necrotic Quality: Adherent Slough Fat Layer (Subcutaneous Tissue) Exposed: Yes Tendon Exposed: No Muscle Exposed: No Joint Exposed: No Bone Exposed: No Treatment Notes Wound #2  (Left, Lateral Lower Leg) 1. Cleanse With Wound Cleanser Soap and water 2. Periwound Care Moisturizing lotion 3. Primary Dressing Applied Calcium Alginate Ag 4. Secondary Dressing ABD Pad Dry Gauze 6. Support Layer Applied Kerlix/Coban Notes netting. Electronic Signature(s) Signed: 10/10/2020 4:37:34 PM By: Baruch Gouty RN, BSN Entered By: Baruch Gouty on 10/10/2020 13:03:46 -------------------------------------------------------------------------------- Wound Assessment Details Patient Name: Date of Service: Sonya Goodwin. 10/10/2020 12:30 PM Medical Record Number: [id number] Patient Account Number: [id number] Date of Birth/Sex: Treating RN: 12-Jun-1941 (79 y.o. [name] Primary Care Ellijah Leffel: Nicholes Stairs Other Clinician: Referring Bailley Guilford: Treating Shaneese Tait/Extender: Nyra Jabs in Treatment: 6 Wound Status Wound Number: 3 Primary Skin Tear Etiology: Wound Location: Right, Anterior Lower Leg Secondary Venous Leg Ulcer Wounding Event: Trauma Etiology: Date Acquired: 09/24/2020 Wound Status: Open Weeks Of Treatment: 2 Comorbid Cataracts, Glaucoma, Hypertension, Type II Diabetes, Clustered Wound: No History: Osteoarthritis, Neuropathy Wound Measurements Length: (cm) 1.5 Width: (cm) 0.8 Depth: (cm) 0.1 Area: (cm) 0.942 Volume: (cm) 0.094 % Reduction in Area: % Reduction in Volume: Epithelialization: Small (1-33%) Tunneling: No Undermining: No Wound Description Classification: Full Thickness Without Exposed Support Structures Wound Margin: Flat and Intact Exudate Amount: Medium Exudate Type: Serous Exudate Color: amber Foul Odor After Cleansing: No Slough/Fibrino No Wound Bed Granulation Amount: Medium (34-66%) Exposed Structure Granulation Quality: Red Fascia Exposed: No Necrotic Amount: Medium (34-66%) Fat Layer (Subcutaneous Tissue) Exposed: Yes Necrotic Quality: Adherent Slough Tendon Exposed:  No Muscle Exposed: No Joint Exposed: No Bone Exposed: No Treatment Notes Wound #3 (Right, Anterior Lower Leg) 1. Cleanse With Wound Cleanser Soap and water 2. Periwound Care Moisturizing lotion 3. Primary Dressing Applied Calcium Alginate Ag 4. Secondary Dressing ABD Pad Dry Gauze 6. Support Layer Applied Kerlix/Coban Notes netting. Electronic Signature(s) Signed: 10/10/2020 4:37:34 PM By: Baruch Gouty RN, BSN Entered By: Baruch Gouty on 10/10/2020 13:04:01 -------------------------------------------------------------------------------- Wound Assessment Details Patient Name: Date of Service: Sonya Goodwin. 10/10/2020 12:30 PM Medical Record Number: [id number] Patient Account Number: [id number] Date of Birth/Sex: Treating RN: [birth year] (79 y.o. [name] Primary Care Shallon Yaklin: Nicholes Stairs Other Clinician: Referring Pj Zehner: Treating Merryn Thaker/Extender: Nyra Jabs in Treatment: 6 Wound Status Wound Number: 4 Primary Skin Tear Etiology: Wound Location: Left, Proximal, Lateral Lower Leg Wound Open Wounding Event: Trauma Status: Date Acquired: 10/10/2020 Comorbid Cataracts, Glaucoma, Hypertension, Type II Diabetes, Weeks Of Treatment: 0 History: Osteoarthritis, Neuropathy Clustered Wound: No Wound Measurements Length: (cm) 1.8 Width: (cm) 0.4 Depth: (cm) 0.1 Area: (cm) 0.565 Volume: (cm) 0.057 % Reduction in Area: % Reduction in Volume: Epithelialization: Small (1-33%) Tunneling: No Undermining: No Wound Description Classification: Full Thickness Without Exposed Support Structures Wound Margin: Flat and Intact Exudate Amount: Small Exudate Type: Serosanguineous Exudate Color: red, brown Foul Odor After Cleansing: No Slough/Fibrino No Wound Bed Granulation Amount: Large (67-100%) Exposed Structure Granulation Quality: Red Fascia Exposed: No Necrotic Amount: None Present (0%) Fat Layer (Subcutaneous  Tissue) Exposed: Yes Tendon Exposed: No Muscle Exposed: No Joint Exposed: No Bone Exposed: No Treatment Notes Wound #4 (Left, Proximal, Lateral  Lower Leg) 1. Cleanse With Wound Cleanser Soap and water 2. Periwound Care Moisturizing lotion 3. Primary Dressing Applied Calcium Alginate Ag 4. Secondary Dressing ABD Pad Dry Gauze 6. Support Layer Applied Kerlix/Coban Notes netting. Electronic Signature(s) Signed: 10/10/2020 4:37:34 PM By: Baruch Gouty RN, BSN Entered By: Baruch Gouty on 10/10/2020 13:01:25 -------------------------------------------------------------------------------- Wound Assessment Details Patient Name: Date of Service: Sonya Goodwin. 10/10/2020 12:30 PM Medical Record Number: [id number] Patient Account Number: [id number] Date of Birth/Sex: Treating RN: 01-13-1941 (79 y.o. [name] Primary Care Jalecia Leon: Nicholes Stairs Other Clinician: Referring Sharri Loya: Treating Shadi Larner/Extender: Nyra Jabs in Treatment: 6 Wound Status Wound Number: 5 Primary Skin Tear Etiology: Wound Location: Right, Lateral Lower Leg Wound Open Wounding Event: Trauma Status: Date Acquired: 10/03/2020 Comorbid Cataracts, Glaucoma, Hypertension, Type II Diabetes, Weeks Of Treatment: 0 History: Osteoarthritis, Neuropathy Clustered Wound: No Wound Measurements Length: (cm) 0.8 Width: (cm) 1 Depth: (cm) 0.1 Area: (cm) 0.628 Volume: (cm) 0.063 % Reduction in Area: % Reduction in Volume: Epithelialization: Small (1-33%) Tunneling: No Undermining: No Wound Description Classification: Full Thickness Without Exposed Support Structures Wound Margin: Flat and Intact Exudate Amount: Small Exudate Type: Serosanguineous Exudate Color: red, brown Foul Odor After Cleansing: No Slough/Fibrino No Wound Bed Granulation Amount: Large (67-100%) Exposed Structure Granulation Quality: Red Fascia Exposed: No Necrotic Amount: None  Present (0%) Fat Layer (Subcutaneous Tissue) Exposed: Yes Tendon Exposed: No Muscle Exposed: No Joint Exposed: No Bone Exposed: No Treatment Notes Wound #5 (Right, Lateral Lower Leg) 1. Cleanse With Wound Cleanser Soap and water 2. Periwound Care Moisturizing lotion 3. Primary Dressing Applied Calcium Alginate Ag 4. Secondary Dressing ABD Pad Dry Gauze 6. Support Layer Applied Kerlix/Coban Notes netting. Electronic Signature(s) Signed: 10/10/2020 4:37:34 PM By: Baruch Gouty RN, BSN Entered By: Baruch Gouty on 10/10/2020 13:05:07 -------------------------------------------------------------------------------- [place] Details Patient Name: Date of Service: Sonya Pier, Sonya TRICIA S. 10/10/2020 12:30 PM Medical Record Number: [id number] Patient Account Number: [id number] Date of Birth/Sex: Treating RN: [birth year] (79 y.o. [name] Primary Care Aleynah Rocchio: Nicholes Stairs Other Clinician: Referring Salsabeel Gorelick: Treating Kiam Bransfield/Extender: Nyra Jabs in Treatment: 6 Vital Signs Time Taken: 12:39 Temperature (F): 98 Height (in): 66 Pulse (bpm): 110 Source: Stated Respiratory Rate (breaths/min): 18 Weight (lbs): 141 Blood Pressure (mmHg): 159/72 Source: Stated Reference Range: 80 - 120 mg / dl Body Mass Index (BMI): 22.8 Electronic Signature(s) Signed: 10/10/2020 4:37:34 PM By: Baruch Gouty RN, BSN Entered By: Baruch Gouty on 10/10/2020 12:40:26

## 2020-10-10 NOTE — Progress Notes (Signed)
EURETHA, NAJARRO ([id number]) Visit Report for 10/10/2020 HPI Details Patient Name: Date of Service: [place], [name] 10/10/2020 12:30 PM Medical Record Number: [id number] Patient Account Number: [id number] Date of Birth/Sex: Treating RN: 1941/09/29 (79 y.o. [name] Primary Care Provider: Nicholes Stairs Other Clinician: Referring Provider: Treating Provider/Extender: Nyra Jabs in Treatment: 6 History of Present Illness HPI Description: We think they have been using silver alginateADMISSION 08/29/2020 This is a 79 year old woman who has wounds on her left lower leg mid aspect. She states that these have been present since June when she was hospitalized with a UTI, delirium possibly medication issues. I do not see any mention of the wounds on her left leg at that time. In any case these develop sometime after this. The patient is followed by Dr. Jacelyn Grip at [place] at [facility]. She has been using bacitracin ointment to the wounds. She has [facility] home health although I am not exactly sure what they are doing. Her husband is angry about a bill from home health again we were not able to help him exactly. The patient has chronic sarcoidosis and adrenal insufficiency she has been on longstanding prednisone. She has all the classic skin features of chronic steroid use. I think this is the primary issue for these wounds. They have also noted weeping fluid coming out of the wounds and even weeping draining fluid on the right leg although we were not able to define any wound on the right leg. She could very well have a component of chronic venous insufficiency as well. Past medical history includes sarcoidosis, type 2 diabetes with a recent hemoglobin A1c of 6.3, diabetic neuropathy, stage III P chronic renal failure, hypertension, adrenal insufficiency. ABI on the right noncompressible on the left at 1.02 9/14 2 small punched out areas of the left  anterior mid tibia. We have been using Iodoflex. Better looking wound surfaces 9/28; the 2 areas on the left anterior mid tibia are about the same. She has a new weeping area on the right side just medial to the tibia. I think all of these wounds are weeping edema fluid from a collection of fluid in the upper third of her lower leg. The skin distally is more fibrosed and there is no room for weeping edema fluid. I wanted to put her in 3 layer compression versus kerlix and Coban but she will not agree to it 10/12; 2-week follow-up. She has the 2 areas on the left anterior mid tibia. There is scissor injury now on the left medial calf. In a single area on the right medial lower extremity. All of these are roughly the same small punched out surfaces. Weeping edema fluid. She does not tolerate anything more than kerlix Coban and even with this she finds this too tight often making her husband cut these off. I had ordered Iodoflex to these wounds but according to her husband that is not what home health is using. Electronic Signature(s) Signed: 10/10/2020 5:41:11 PM By: Linton Ham MD Entered By: Linton Ham on 10/10/2020 13:34:44 -------------------------------------------------------------------------------- Physical Exam Details Patient Name: Date of Service: [name] 10/10/2020 12:30 PM Medical Record Number: [id number] Patient Account Number: [id number] Date of Birth/Sex: Treating RN: 12-18-1941 (79 y.o. [name] Primary Care Provider: Nicholes Stairs Other Clinician: Referring Provider: Treating Provider/Extender: Nyra Jabs in Treatment: 6 Constitutional Patient is hypertensive.. Pulse regular and within target range for patient.Marland Kitchen Respirations regular, non-labored and within  target range.. Temperature is normal and within the target range for the patient.Marland Kitchen Appears in no distress. Cardiovascular Cyanosed toes but easily palpable  pulses. Integumentary (Hair, Skin) Skin changes of severe chronic steroid use. Notes Wound exam; still a small circular punched out area x2 in the mid lower tibia. The surface of these look pale. She has a new scissor injury superiorly just lateral to the tibia. On the right an area on the right medial tibia. Electronic Signature(s) Signed: 10/10/2020 5:41:11 PM By: Linton Ham MD Entered By: Linton Ham on 10/10/2020 13:34:02 -------------------------------------------------------------------------------- Physician Orders Details Patient Name: Date of Service: [name] 10/10/2020 12:30 PM Medical Record Number: [id number] Patient Account Number: [id number] Date of Birth/Sex: Treating RN: [birth year] (79 y.o. [name] Primary Care Provider: Nicholes Stairs Other Clinician: Referring Provider: Treating Provider/Extender: Nyra Jabs in Treatment: 6 Verbal / Phone Orders: No Diagnosis Coding ICD-10 Coding Code Description [phone] Non-pressure chronic ulcer of other part of left lower leg limited to breakdown of skin I87.2 Venous insufficiency (chronic) (peripheral) Z92.241 Personal history of systemic steroid therapy L97.819 Non-pressure chronic ulcer of other part of right lower leg with unspecified severity Follow-up Appointments Return appointment in 3 weeks. Dressing Change Frequency Wound #1 Left,Anterior Lower Leg Change dressing three times week. Wound #2 Left,Lateral Lower Leg Change dressing three times week. Wound #3 Right,Anterior Lower Leg Change dressing three times week. Wound Cleansing Wound #1 Left,Anterior Lower Leg Clean wound with Normal Saline. Wound #2 Left,Lateral Lower Leg Clean wound with Normal Saline. Wound #3 Right,Anterior Lower Leg Clean wound with Normal Saline. Primary Wound Dressing Wound #1 Left,Anterior Lower Leg Calcium Alginate with Silver Wound #2 Left,Lateral Lower Leg Calcium  Alginate with Silver Wound #3 Right,Anterior Lower Leg Calcium Alginate with Silver Wound #4 Left,Proximal,Lateral Lower Leg Calcium Alginate with Silver Secondary Dressing Wound #1 Left,Anterior Lower Leg Dry Gauze ABD pad Wound #2 Left,Lateral Lower Leg Dry Gauze ABD pad Wound #3 Right,Anterior Lower Leg Dry Gauze ABD pad Edema Control Wound #1 Left,Anterior Lower Leg Kerlix and Coban - Bilateral Wound #2 Left,Lateral Lower Leg Kerlix and Coban - Bilateral Wound #3 Right,Anterior Lower Leg Kerlix and Coban - Bilateral Home Health Wound #1 Left,Anterior Lower Leg [place] skilled nursing for wound care. Jackquline [place] Wound #2 [place] skilled nursing for wound care. Jackquline [place] Electronic Signature(s) Signed: 10/10/2020 5:41:11 PM By: Linton Ham MD Signed: 10/10/2020 5:49:00 PM By: Carlene Coria RN Entered By: Carlene Coria on 10/10/2020 13:28:34 -------------------------------------------------------------------------------- Problem List Details Patient Name: Date of Service: [name] 10/10/2020 12:30 PM Medical Record Number: [id number] Patient Account Number: [id number] Date of Birth/Sex: Treating RN: [birth year] (79 y.o. [name] Primary Care Provider: Nicholes Stairs Other Clinician: Referring Provider: Treating Provider/Extender: Nyra Jabs in Treatment: 6 Active Problems ICD-10 Encounter Code Description Active Date MDM Diagnosis [id number] Non-pressure chronic ulcer of other part of left lower leg limited to breakdown 08/29/2020 No Yes of skin I87.2 Venous insufficiency (chronic) (peripheral) 08/29/2020 No Yes Z92.241 Personal history of systemic steroid therapy 08/29/2020 No Yes L97.819 Non-pressure chronic ulcer of other part of right lower leg with unspecified 09/26/2020 No Yes severity Inactive Problems Resolved Problems Electronic Signature(s) Signed: 10/10/2020  5:41:11 PM By: Linton Ham MD Entered By: Linton Ham on 10/10/2020 13:30:10 -------------------------------------------------------------------------------- Progress Note Details Patient Name: Date of Service: [name] 10/10/2020 12:30 PM Medical Record Number: [id number] Patient Account Number:  [id number] Date of Birth/Sex: Treating RN: [birth year] (79 y.o. [name] Primary Care Provider: Nicholes Stairs Other Clinician: Referring Provider: Treating Provider/Extender: Nyra Jabs in Treatment: 6 Subjective History of Present Illness (HPI) We think they have been using silver alginateADMISSION 08/29/2020 This is a 79 year old woman who has wounds on her left lower leg mid aspect. She states that these have been present since June when she was hospitalized with a UTI, delirium possibly medication issues. I do not see any mention of the wounds on her left leg at that time. In any case these develop sometime after this. The patient is followed by Dr. Jacelyn Grip at [place] at [facility]. She has been using bacitracin ointment to the wounds. She has [facility] home health although I am not exactly sure what they are doing. Her husband is angry about a bill from home health again we were not able to help him exactly. The patient has chronic sarcoidosis and adrenal insufficiency she has been on longstanding prednisone. She has all the classic skin features of chronic steroid use. I think this is the primary issue for these wounds. They have also noted weeping fluid coming out of the wounds and even weeping draining fluid on the right leg although we were not able to define any wound on the right leg. She could very well have a component of chronic venous insufficiency as well. Past medical history includes sarcoidosis, type 2 diabetes with a recent hemoglobin A1c of 6.3, diabetic neuropathy, stage III P chronic renal failure, hypertension, adrenal  insufficiency. ABI on the right noncompressible on the left at 1.02 9/14 2 small punched out areas of the left anterior mid tibia. We have been using Iodoflex. Better looking wound surfaces 9/28; the 2 areas on the left anterior mid tibia are about the same. She has a new weeping area on the right side just medial to the tibia. I think all of these wounds are weeping edema fluid from a collection of fluid in the upper third of her lower leg. The skin distally is more fibrosed and there is no room for weeping edema fluid. I wanted to put her in 3 layer compression versus kerlix and Coban but she will not agree to it 10/12; 2-week follow-up. She has the 2 areas on the left anterior mid tibia. There is scissor injury now on the left medial calf. In a single area on the right medial lower extremity. All of these are roughly the same small punched out surfaces. Weeping edema fluid. She does not tolerate anything more than kerlix Coban and even with this she finds this too tight often making her husband cut these off. I had ordered Iodoflex to these wounds but according to her husband that is not what home health is using. Objective Constitutional Patient is hypertensive.. Pulse regular and within target range for patient.Marland Kitchen Respirations regular, non-labored and within target range.. Temperature is normal and within the target range for the patient.Marland Kitchen Appears in no distress. Vitals Time Taken: 12:39 PM, Height: 66 in, Source: Stated, Weight: 141 lbs, Source: Stated, BMI: 22.8, Temperature: 98 F, Pulse: 110 bpm, Respiratory Rate: 18 breaths/min, Blood Pressure: 159/72 mmHg. Cardiovascular Cyanosed toes but easily palpable pulses. General Notes: Wound exam; still a small circular punched out area x2 in the mid lower tibia. The surface of these look pale. She has a new scissor injury superiorly just lateral to the tibia. ooOn the right an area on the right medial tibia. Integumentary (  Hair, Skin) Skin  changes of severe chronic steroid use. Wound #1 status is Open. Original cause of wound was Gradually Appeared. The wound is located on the Left,Anterior Lower Leg. The wound measures 1.4cm length x 0.8cm width x 0.2cm depth; 0.88cm^2 area and 0.176cm^3 volume. There is Fat Layer (Subcutaneous Tissue) exposed. There is no tunneling noted, however, there is undermining starting at 10:00 and ending at 5:00 with a maximum distance of 0.3cm. There is a medium amount of serosanguineous drainage noted. The wound margin is flat and intact. There is medium (34-66%) red granulation within the wound bed. There is a medium (34-66%) amount of necrotic tissue within the wound bed including Adherent Slough. Wound #2 status is Open. Original cause of wound was Gradually Appeared. The wound is located on the Left,Lateral Lower Leg. The wound measures 1.7cm length x 0.7cm width x 0.2cm depth; 0.935cm^2 area and 0.187cm^3 volume. There is Fat Layer (Subcutaneous Tissue) exposed. There is no tunneling noted, however, there is undermining starting at 12:00 and ending at 12:00 with a maximum distance of 0.8cm. There is a medium amount of serous drainage noted. The wound margin is well defined and not attached to the wound base. There is no granulation within the wound bed. There is a large (67-100%) amount of necrotic tissue within the wound bed including Adherent Slough. Wound #3 status is Open. Original cause of wound was Trauma. The wound is located on the Right,Anterior Lower Leg. The wound measures 1.5cm length x 0.8cm width x 0.1cm depth; 0.942cm^2 area and 0.094cm^3 volume. There is Fat Layer (Subcutaneous Tissue) exposed. There is no tunneling or undermining noted. There is a medium amount of serous drainage noted. The wound margin is flat and intact. There is medium (34-66%) red granulation within the wound bed. There is a medium (34-66%) amount of necrotic tissue within the wound bed including Adherent  Slough. Wound #4 status is Open. Original cause of wound was Trauma. The wound is located on the Left,Proximal,Lateral Lower Leg. The wound measures 1.8cm length x 0.4cm width x 0.1cm depth; 0.565cm^2 area and 0.057cm^3 volume. There is Fat Layer (Subcutaneous Tissue) exposed. There is no tunneling or undermining noted. There is a small amount of serosanguineous drainage noted. The wound margin is flat and intact. There is large (67-100%) red granulation within the wound bed. There is no necrotic tissue within the wound bed. Wound #5 status is Open. Original cause of wound was Trauma. The wound is located on the Right,Lateral Lower Leg. The wound measures 0.8cm length x 1cm width x 0.1cm depth; 0.628cm^2 area and 0.063cm^3 volume. There is Fat Layer (Subcutaneous Tissue) exposed. There is no tunneling or undermining noted. There is a small amount of serosanguineous drainage noted. The wound margin is flat and intact. There is large (67-100%) red granulation within the wound bed. There is no necrotic tissue within the wound bed. Assessment Active Problems ICD-10 Non-pressure chronic ulcer of other part of left lower leg limited to breakdown of skin Venous insufficiency (chronic) (peripheral) Personal history of systemic steroid therapy Non-pressure chronic ulcer of other part of right lower leg with unspecified severity Procedures Wound #1 Pre-procedure diagnosis of Wound #1 is a Venous Leg Ulcer located on the Left,Anterior Lower Leg . There was a Double Layer Compression Therapy Procedure by Carlene Coria, RN. Post procedure Diagnosis Wound #1: Same as Pre-Procedure Wound #2 Pre-procedure diagnosis of Wound #2 is a Diabetic Wound/Ulcer of the Lower Extremity located on the Left,Lateral Lower Leg . There was a  Double Layer Compression Therapy Procedure by Carlene Coria, RN. Post procedure Diagnosis Wound #2: Same as Pre-Procedure Wound #3 Pre-procedure diagnosis of Wound #3 is a Skin T  located on the Right,Anterior Lower Leg . There was a Double Layer Compression Therapy Procedure by ear Carlene Coria, RN. Post procedure Diagnosis Wound #3: Same as Pre-Procedure Wound #4 Pre-procedure diagnosis of Wound #4 is a Skin T located on the Left,Proximal,Lateral Lower Leg . There was a Double Layer Compression Therapy ear Procedure by Carlene Coria, RN. Post procedure Diagnosis Wound #4: Same as Pre-Procedure Wound #5 Pre-procedure diagnosis of Wound #5 is a Skin T located on the Right,Lateral Lower Leg . There was a Double Layer Compression Therapy Procedure by ear Carlene Coria, RN. Post procedure Diagnosis Wound #5: Same as Pre-Procedure Plan Follow-up Appointments: Return appointment in 3 weeks. Dressing Change Frequency: Wound #1 Left,Anterior Lower Leg: Change dressing three times week. Wound #2 Left,Lateral Lower Leg: Change dressing three times week. Wound #3 Right,Anterior Lower Leg: Change dressing three times week. Wound Cleansing: Wound #1 Left,Anterior Lower Leg: Clean wound with Normal Saline. Wound #2 Left,Lateral Lower Leg: Clean wound with Normal Saline. Wound #3 Right,Anterior Lower Leg: Clean wound with Normal Saline. Primary Wound Dressing: Wound #1 Left,Anterior Lower Leg: Calcium Alginate with Silver Wound #2 Left,Lateral Lower Leg: Calcium Alginate with Silver Wound #3 Right,Anterior Lower Leg: Calcium Alginate with Silver Wound #4 Left,Proximal,Lateral Lower Leg: Calcium Alginate with Silver Secondary Dressing: Wound #1 Left,Anterior Lower Leg: Dry Gauze ABD pad Wound #2 Left,Lateral Lower Leg: Dry Gauze ABD pad Wound #3 Right,Anterior Lower Leg: Dry Gauze ABD pad Edema Control: Wound #1 Left,Anterior Lower Leg: Kerlix and Coban - Bilateral Wound #2 Left,Lateral Lower Leg: Kerlix and Coban - Bilateral Wound #3 Right,Anterior Lower Leg: Kerlix and Coban - Bilateral Home Health: Wound #1 Left,Anterior Lower Leg: [place] skilled nursing for wound care. [facility] Wound #2 Left,Lateral Lower Leg: [place] skilled nursing for wound care. - [facility] 1. I went with the silver alginate although this is against the better my better judgment. This seems to be what they have to put on it through home health 2. Her edema control is reasonably good. She does not tolerate anything more than kerlix Coban 3. Follow-up 3 weeks Electronic Signature(s) Signed: 10/10/2020 5:41:11 PM By: Linton Ham MD Entered By: Linton Ham on 10/10/2020 13:35:29 -------------------------------------------------------------------------------- SuperBill Details Patient Name: Date of Service: [name] 10/10/2020 Medical Record Number: [id number] Patient Account Number: [id number] Date of Birth/Sex: Treating RN: 08/23/1941 (79 y.o. [name] Primary Care Provider: Nicholes Stairs Other Clinician: Referring Provider: Treating Provider/Extender: Nyra Jabs in Treatment: 6 Diagnosis Coding ICD-10 Codes Code Description [phone] Non-pressure chronic ulcer of other part of left lower leg limited to breakdown of skin I87.2 Venous insufficiency (chronic) (peripheral) Z92.241 Personal history of systemic steroid therapy L97.819 Non-pressure chronic ulcer of other part of right lower leg with unspecified severity Physician Procedures : CPT4 Code Description Modifier [id number] - WC PHYS LEVEL 3 - EST PT ICD-10 Diagnosis Description L97.821 Non-pressure chronic ulcer of other part of left lower leg limited to breakdown of skin L97.819 Non-pressure chronic ulcer of other part of  right lower leg with unspecified severity I87.2 Venous insufficiency (chronic) (peripheral) Z92.241 Personal history of systemic steroid therapy Quantity: 1 Electronic Signature(s) Signed: 10/10/2020 5:41:11 PM By: Linton Ham MD Entered By: Linton Ham on 10/10/2020 13:35:49

## 2020-10-11 DIAGNOSIS — S81812A Laceration without foreign body, left lower leg, initial encounter: Secondary | ICD-10-CM | POA: Diagnosis not present

## 2020-10-12 DIAGNOSIS — E1142 Type 2 diabetes mellitus with diabetic polyneuropathy: Secondary | ICD-10-CM | POA: Diagnosis not present

## 2020-10-12 DIAGNOSIS — N183 Chronic kidney disease, stage 3 unspecified: Secondary | ICD-10-CM | POA: Diagnosis not present

## 2020-10-12 DIAGNOSIS — H409 Unspecified glaucoma: Secondary | ICD-10-CM | POA: Diagnosis not present

## 2020-10-12 DIAGNOSIS — M8580 Other specified disorders of bone density and structure, unspecified site: Secondary | ICD-10-CM | POA: Diagnosis not present

## 2020-10-12 DIAGNOSIS — N1831 Chronic kidney disease, stage 3a: Secondary | ICD-10-CM | POA: Diagnosis not present

## 2020-10-12 DIAGNOSIS — E1122 Type 2 diabetes mellitus with diabetic chronic kidney disease: Secondary | ICD-10-CM | POA: Diagnosis not present

## 2020-10-12 DIAGNOSIS — M1710 Unilateral primary osteoarthritis, unspecified knee: Secondary | ICD-10-CM | POA: Diagnosis not present

## 2020-10-12 DIAGNOSIS — D869 Sarcoidosis, unspecified: Secondary | ICD-10-CM | POA: Diagnosis not present

## 2020-10-12 DIAGNOSIS — I129 Hypertensive chronic kidney disease with stage 1 through stage 4 chronic kidney disease, or unspecified chronic kidney disease: Secondary | ICD-10-CM | POA: Diagnosis not present

## 2020-10-12 DIAGNOSIS — L89891 Pressure ulcer of other site, stage 1: Secondary | ICD-10-CM | POA: Diagnosis not present

## 2020-10-12 DIAGNOSIS — M1990 Unspecified osteoarthritis, unspecified site: Secondary | ICD-10-CM | POA: Diagnosis not present

## 2020-10-12 DIAGNOSIS — N189 Chronic kidney disease, unspecified: Secondary | ICD-10-CM | POA: Diagnosis not present

## 2020-10-12 DIAGNOSIS — I10 Essential (primary) hypertension: Secondary | ICD-10-CM | POA: Diagnosis not present

## 2020-10-12 DIAGNOSIS — E2740 Unspecified adrenocortical insufficiency: Secondary | ICD-10-CM | POA: Diagnosis not present

## 2020-10-12 DIAGNOSIS — E782 Mixed hyperlipidemia: Secondary | ICD-10-CM | POA: Diagnosis not present

## 2020-10-12 DIAGNOSIS — G2581 Restless legs syndrome: Secondary | ICD-10-CM | POA: Diagnosis not present

## 2020-10-12 DIAGNOSIS — E1143 Type 2 diabetes mellitus with diabetic autonomic (poly)neuropathy: Secondary | ICD-10-CM | POA: Diagnosis not present

## 2020-10-12 DIAGNOSIS — M810 Age-related osteoporosis without current pathological fracture: Secondary | ICD-10-CM | POA: Diagnosis not present

## 2020-10-13 DIAGNOSIS — M1710 Unilateral primary osteoarthritis, unspecified knee: Secondary | ICD-10-CM | POA: Diagnosis not present

## 2020-10-13 DIAGNOSIS — G2581 Restless legs syndrome: Secondary | ICD-10-CM | POA: Diagnosis not present

## 2020-10-13 DIAGNOSIS — E2740 Unspecified adrenocortical insufficiency: Secondary | ICD-10-CM | POA: Diagnosis not present

## 2020-10-13 DIAGNOSIS — E1143 Type 2 diabetes mellitus with diabetic autonomic (poly)neuropathy: Secondary | ICD-10-CM | POA: Diagnosis not present

## 2020-10-13 DIAGNOSIS — E1142 Type 2 diabetes mellitus with diabetic polyneuropathy: Secondary | ICD-10-CM | POA: Diagnosis not present

## 2020-10-13 DIAGNOSIS — L89891 Pressure ulcer of other site, stage 1: Secondary | ICD-10-CM | POA: Diagnosis not present

## 2020-10-13 DIAGNOSIS — E1122 Type 2 diabetes mellitus with diabetic chronic kidney disease: Secondary | ICD-10-CM | POA: Diagnosis not present

## 2020-10-13 DIAGNOSIS — N1831 Chronic kidney disease, stage 3a: Secondary | ICD-10-CM | POA: Diagnosis not present

## 2020-10-13 DIAGNOSIS — I129 Hypertensive chronic kidney disease with stage 1 through stage 4 chronic kidney disease, or unspecified chronic kidney disease: Secondary | ICD-10-CM | POA: Diagnosis not present

## 2020-10-13 DIAGNOSIS — D869 Sarcoidosis, unspecified: Secondary | ICD-10-CM | POA: Diagnosis not present

## 2020-10-16 DIAGNOSIS — L89891 Pressure ulcer of other site, stage 1: Secondary | ICD-10-CM | POA: Diagnosis not present

## 2020-10-16 DIAGNOSIS — M1710 Unilateral primary osteoarthritis, unspecified knee: Secondary | ICD-10-CM | POA: Diagnosis not present

## 2020-10-16 DIAGNOSIS — D869 Sarcoidosis, unspecified: Secondary | ICD-10-CM | POA: Diagnosis not present

## 2020-10-16 DIAGNOSIS — E1122 Type 2 diabetes mellitus with diabetic chronic kidney disease: Secondary | ICD-10-CM | POA: Diagnosis not present

## 2020-10-16 DIAGNOSIS — E2740 Unspecified adrenocortical insufficiency: Secondary | ICD-10-CM | POA: Diagnosis not present

## 2020-10-16 DIAGNOSIS — N1831 Chronic kidney disease, stage 3a: Secondary | ICD-10-CM | POA: Diagnosis not present

## 2020-10-16 DIAGNOSIS — I129 Hypertensive chronic kidney disease with stage 1 through stage 4 chronic kidney disease, or unspecified chronic kidney disease: Secondary | ICD-10-CM | POA: Diagnosis not present

## 2020-10-16 DIAGNOSIS — G2581 Restless legs syndrome: Secondary | ICD-10-CM | POA: Diagnosis not present

## 2020-10-16 DIAGNOSIS — E1143 Type 2 diabetes mellitus with diabetic autonomic (poly)neuropathy: Secondary | ICD-10-CM | POA: Diagnosis not present

## 2020-10-16 DIAGNOSIS — E1142 Type 2 diabetes mellitus with diabetic polyneuropathy: Secondary | ICD-10-CM | POA: Diagnosis not present

## 2020-10-17 DIAGNOSIS — E2740 Unspecified adrenocortical insufficiency: Secondary | ICD-10-CM | POA: Diagnosis not present

## 2020-10-17 DIAGNOSIS — L89891 Pressure ulcer of other site, stage 1: Secondary | ICD-10-CM | POA: Diagnosis not present

## 2020-10-17 DIAGNOSIS — I129 Hypertensive chronic kidney disease with stage 1 through stage 4 chronic kidney disease, or unspecified chronic kidney disease: Secondary | ICD-10-CM | POA: Diagnosis not present

## 2020-10-17 DIAGNOSIS — E1142 Type 2 diabetes mellitus with diabetic polyneuropathy: Secondary | ICD-10-CM | POA: Diagnosis not present

## 2020-10-17 DIAGNOSIS — D869 Sarcoidosis, unspecified: Secondary | ICD-10-CM | POA: Diagnosis not present

## 2020-10-17 DIAGNOSIS — E1122 Type 2 diabetes mellitus with diabetic chronic kidney disease: Secondary | ICD-10-CM | POA: Diagnosis not present

## 2020-10-17 DIAGNOSIS — G2581 Restless legs syndrome: Secondary | ICD-10-CM | POA: Diagnosis not present

## 2020-10-17 DIAGNOSIS — N1831 Chronic kidney disease, stage 3a: Secondary | ICD-10-CM | POA: Diagnosis not present

## 2020-10-17 DIAGNOSIS — M1710 Unilateral primary osteoarthritis, unspecified knee: Secondary | ICD-10-CM | POA: Diagnosis not present

## 2020-10-17 DIAGNOSIS — E1143 Type 2 diabetes mellitus with diabetic autonomic (poly)neuropathy: Secondary | ICD-10-CM | POA: Diagnosis not present

## 2020-10-18 DIAGNOSIS — M1710 Unilateral primary osteoarthritis, unspecified knee: Secondary | ICD-10-CM | POA: Diagnosis not present

## 2020-10-18 DIAGNOSIS — D869 Sarcoidosis, unspecified: Secondary | ICD-10-CM | POA: Diagnosis not present

## 2020-10-18 DIAGNOSIS — G2581 Restless legs syndrome: Secondary | ICD-10-CM | POA: Diagnosis not present

## 2020-10-18 DIAGNOSIS — I129 Hypertensive chronic kidney disease with stage 1 through stage 4 chronic kidney disease, or unspecified chronic kidney disease: Secondary | ICD-10-CM | POA: Diagnosis not present

## 2020-10-18 DIAGNOSIS — E2740 Unspecified adrenocortical insufficiency: Secondary | ICD-10-CM | POA: Diagnosis not present

## 2020-10-18 DIAGNOSIS — E1143 Type 2 diabetes mellitus with diabetic autonomic (poly)neuropathy: Secondary | ICD-10-CM | POA: Diagnosis not present

## 2020-10-18 DIAGNOSIS — E1122 Type 2 diabetes mellitus with diabetic chronic kidney disease: Secondary | ICD-10-CM | POA: Diagnosis not present

## 2020-10-18 DIAGNOSIS — E1142 Type 2 diabetes mellitus with diabetic polyneuropathy: Secondary | ICD-10-CM | POA: Diagnosis not present

## 2020-10-18 DIAGNOSIS — L89891 Pressure ulcer of other site, stage 1: Secondary | ICD-10-CM | POA: Diagnosis not present

## 2020-10-18 DIAGNOSIS — N1831 Chronic kidney disease, stage 3a: Secondary | ICD-10-CM | POA: Diagnosis not present

## 2020-10-20 DIAGNOSIS — L89891 Pressure ulcer of other site, stage 1: Secondary | ICD-10-CM | POA: Diagnosis not present

## 2020-10-20 DIAGNOSIS — E1122 Type 2 diabetes mellitus with diabetic chronic kidney disease: Secondary | ICD-10-CM | POA: Diagnosis not present

## 2020-10-20 DIAGNOSIS — G2581 Restless legs syndrome: Secondary | ICD-10-CM | POA: Diagnosis not present

## 2020-10-20 DIAGNOSIS — M1710 Unilateral primary osteoarthritis, unspecified knee: Secondary | ICD-10-CM | POA: Diagnosis not present

## 2020-10-20 DIAGNOSIS — E1142 Type 2 diabetes mellitus with diabetic polyneuropathy: Secondary | ICD-10-CM | POA: Diagnosis not present

## 2020-10-20 DIAGNOSIS — E1143 Type 2 diabetes mellitus with diabetic autonomic (poly)neuropathy: Secondary | ICD-10-CM | POA: Diagnosis not present

## 2020-10-20 DIAGNOSIS — D869 Sarcoidosis, unspecified: Secondary | ICD-10-CM | POA: Diagnosis not present

## 2020-10-20 DIAGNOSIS — N1831 Chronic kidney disease, stage 3a: Secondary | ICD-10-CM | POA: Diagnosis not present

## 2020-10-20 DIAGNOSIS — E2740 Unspecified adrenocortical insufficiency: Secondary | ICD-10-CM | POA: Diagnosis not present

## 2020-10-20 DIAGNOSIS — I129 Hypertensive chronic kidney disease with stage 1 through stage 4 chronic kidney disease, or unspecified chronic kidney disease: Secondary | ICD-10-CM | POA: Diagnosis not present

## 2020-10-22 DIAGNOSIS — R69 Illness, unspecified: Secondary | ICD-10-CM | POA: Diagnosis not present

## 2020-10-23 DIAGNOSIS — I129 Hypertensive chronic kidney disease with stage 1 through stage 4 chronic kidney disease, or unspecified chronic kidney disease: Secondary | ICD-10-CM | POA: Diagnosis not present

## 2020-10-23 DIAGNOSIS — S81812A Laceration without foreign body, left lower leg, initial encounter: Secondary | ICD-10-CM | POA: Diagnosis not present

## 2020-10-23 DIAGNOSIS — L97822 Non-pressure chronic ulcer of other part of left lower leg with fat layer exposed: Secondary | ICD-10-CM | POA: Diagnosis not present

## 2020-10-23 DIAGNOSIS — E2740 Unspecified adrenocortical insufficiency: Secondary | ICD-10-CM | POA: Diagnosis not present

## 2020-10-23 DIAGNOSIS — M1710 Unilateral primary osteoarthritis, unspecified knee: Secondary | ICD-10-CM | POA: Diagnosis not present

## 2020-10-23 DIAGNOSIS — E1142 Type 2 diabetes mellitus with diabetic polyneuropathy: Secondary | ICD-10-CM | POA: Diagnosis not present

## 2020-10-23 DIAGNOSIS — L97812 Non-pressure chronic ulcer of other part of right lower leg with fat layer exposed: Secondary | ICD-10-CM | POA: Diagnosis not present

## 2020-10-23 DIAGNOSIS — I872 Venous insufficiency (chronic) (peripheral): Secondary | ICD-10-CM | POA: Diagnosis not present

## 2020-10-23 DIAGNOSIS — N1831 Chronic kidney disease, stage 3a: Secondary | ICD-10-CM | POA: Diagnosis not present

## 2020-10-23 DIAGNOSIS — S81811A Laceration without foreign body, right lower leg, initial encounter: Secondary | ICD-10-CM | POA: Diagnosis not present

## 2020-10-23 DIAGNOSIS — E1122 Type 2 diabetes mellitus with diabetic chronic kidney disease: Secondary | ICD-10-CM | POA: Diagnosis not present

## 2020-10-23 DIAGNOSIS — E1143 Type 2 diabetes mellitus with diabetic autonomic (poly)neuropathy: Secondary | ICD-10-CM | POA: Diagnosis not present

## 2020-10-24 DIAGNOSIS — L97812 Non-pressure chronic ulcer of other part of right lower leg with fat layer exposed: Secondary | ICD-10-CM | POA: Diagnosis not present

## 2020-10-24 DIAGNOSIS — M1710 Unilateral primary osteoarthritis, unspecified knee: Secondary | ICD-10-CM | POA: Diagnosis not present

## 2020-10-24 DIAGNOSIS — I872 Venous insufficiency (chronic) (peripheral): Secondary | ICD-10-CM | POA: Diagnosis not present

## 2020-10-24 DIAGNOSIS — E1122 Type 2 diabetes mellitus with diabetic chronic kidney disease: Secondary | ICD-10-CM | POA: Diagnosis not present

## 2020-10-24 DIAGNOSIS — E1142 Type 2 diabetes mellitus with diabetic polyneuropathy: Secondary | ICD-10-CM | POA: Diagnosis not present

## 2020-10-24 DIAGNOSIS — N1831 Chronic kidney disease, stage 3a: Secondary | ICD-10-CM | POA: Diagnosis not present

## 2020-10-24 DIAGNOSIS — I129 Hypertensive chronic kidney disease with stage 1 through stage 4 chronic kidney disease, or unspecified chronic kidney disease: Secondary | ICD-10-CM | POA: Diagnosis not present

## 2020-10-24 DIAGNOSIS — E1143 Type 2 diabetes mellitus with diabetic autonomic (poly)neuropathy: Secondary | ICD-10-CM | POA: Diagnosis not present

## 2020-10-24 DIAGNOSIS — E2740 Unspecified adrenocortical insufficiency: Secondary | ICD-10-CM | POA: Diagnosis not present

## 2020-10-24 DIAGNOSIS — L97822 Non-pressure chronic ulcer of other part of left lower leg with fat layer exposed: Secondary | ICD-10-CM | POA: Diagnosis not present

## 2020-10-25 DIAGNOSIS — L97822 Non-pressure chronic ulcer of other part of left lower leg with fat layer exposed: Secondary | ICD-10-CM | POA: Diagnosis not present

## 2020-10-25 DIAGNOSIS — E2740 Unspecified adrenocortical insufficiency: Secondary | ICD-10-CM | POA: Diagnosis not present

## 2020-10-25 DIAGNOSIS — M1710 Unilateral primary osteoarthritis, unspecified knee: Secondary | ICD-10-CM | POA: Diagnosis not present

## 2020-10-25 DIAGNOSIS — N1831 Chronic kidney disease, stage 3a: Secondary | ICD-10-CM | POA: Diagnosis not present

## 2020-10-25 DIAGNOSIS — E1143 Type 2 diabetes mellitus with diabetic autonomic (poly)neuropathy: Secondary | ICD-10-CM | POA: Diagnosis not present

## 2020-10-25 DIAGNOSIS — E1122 Type 2 diabetes mellitus with diabetic chronic kidney disease: Secondary | ICD-10-CM | POA: Diagnosis not present

## 2020-10-25 DIAGNOSIS — E1142 Type 2 diabetes mellitus with diabetic polyneuropathy: Secondary | ICD-10-CM | POA: Diagnosis not present

## 2020-10-25 DIAGNOSIS — I129 Hypertensive chronic kidney disease with stage 1 through stage 4 chronic kidney disease, or unspecified chronic kidney disease: Secondary | ICD-10-CM | POA: Diagnosis not present

## 2020-10-25 DIAGNOSIS — I872 Venous insufficiency (chronic) (peripheral): Secondary | ICD-10-CM | POA: Diagnosis not present

## 2020-10-25 DIAGNOSIS — L97812 Non-pressure chronic ulcer of other part of right lower leg with fat layer exposed: Secondary | ICD-10-CM | POA: Diagnosis not present

## 2020-10-27 DIAGNOSIS — M1710 Unilateral primary osteoarthritis, unspecified knee: Secondary | ICD-10-CM | POA: Diagnosis not present

## 2020-10-27 DIAGNOSIS — N1831 Chronic kidney disease, stage 3a: Secondary | ICD-10-CM | POA: Diagnosis not present

## 2020-10-27 DIAGNOSIS — L97812 Non-pressure chronic ulcer of other part of right lower leg with fat layer exposed: Secondary | ICD-10-CM | POA: Diagnosis not present

## 2020-10-27 DIAGNOSIS — L97822 Non-pressure chronic ulcer of other part of left lower leg with fat layer exposed: Secondary | ICD-10-CM | POA: Diagnosis not present

## 2020-10-27 DIAGNOSIS — I872 Venous insufficiency (chronic) (peripheral): Secondary | ICD-10-CM | POA: Diagnosis not present

## 2020-10-27 DIAGNOSIS — E2740 Unspecified adrenocortical insufficiency: Secondary | ICD-10-CM | POA: Diagnosis not present

## 2020-10-27 DIAGNOSIS — I129 Hypertensive chronic kidney disease with stage 1 through stage 4 chronic kidney disease, or unspecified chronic kidney disease: Secondary | ICD-10-CM | POA: Diagnosis not present

## 2020-10-27 DIAGNOSIS — E1143 Type 2 diabetes mellitus with diabetic autonomic (poly)neuropathy: Secondary | ICD-10-CM | POA: Diagnosis not present

## 2020-10-27 DIAGNOSIS — E1142 Type 2 diabetes mellitus with diabetic polyneuropathy: Secondary | ICD-10-CM | POA: Diagnosis not present

## 2020-10-27 DIAGNOSIS — E1122 Type 2 diabetes mellitus with diabetic chronic kidney disease: Secondary | ICD-10-CM | POA: Diagnosis not present

## 2020-10-29 DIAGNOSIS — S81812A Laceration without foreign body, left lower leg, initial encounter: Secondary | ICD-10-CM | POA: Diagnosis not present

## 2020-10-29 DIAGNOSIS — S81811A Laceration without foreign body, right lower leg, initial encounter: Secondary | ICD-10-CM | POA: Diagnosis not present

## 2020-10-30 DIAGNOSIS — L97812 Non-pressure chronic ulcer of other part of right lower leg with fat layer exposed: Secondary | ICD-10-CM | POA: Diagnosis not present

## 2020-10-30 DIAGNOSIS — S81812A Laceration without foreign body, left lower leg, initial encounter: Secondary | ICD-10-CM | POA: Diagnosis not present

## 2020-10-30 DIAGNOSIS — L97822 Non-pressure chronic ulcer of other part of left lower leg with fat layer exposed: Secondary | ICD-10-CM | POA: Diagnosis not present

## 2020-10-30 DIAGNOSIS — M1710 Unilateral primary osteoarthritis, unspecified knee: Secondary | ICD-10-CM | POA: Diagnosis not present

## 2020-10-30 DIAGNOSIS — N1831 Chronic kidney disease, stage 3a: Secondary | ICD-10-CM | POA: Diagnosis not present

## 2020-10-30 DIAGNOSIS — E2740 Unspecified adrenocortical insufficiency: Secondary | ICD-10-CM | POA: Diagnosis not present

## 2020-10-30 DIAGNOSIS — I872 Venous insufficiency (chronic) (peripheral): Secondary | ICD-10-CM | POA: Diagnosis not present

## 2020-10-30 DIAGNOSIS — I129 Hypertensive chronic kidney disease with stage 1 through stage 4 chronic kidney disease, or unspecified chronic kidney disease: Secondary | ICD-10-CM | POA: Diagnosis not present

## 2020-10-30 DIAGNOSIS — E1142 Type 2 diabetes mellitus with diabetic polyneuropathy: Secondary | ICD-10-CM | POA: Diagnosis not present

## 2020-10-30 DIAGNOSIS — E1122 Type 2 diabetes mellitus with diabetic chronic kidney disease: Secondary | ICD-10-CM | POA: Diagnosis not present

## 2020-10-30 DIAGNOSIS — E1143 Type 2 diabetes mellitus with diabetic autonomic (poly)neuropathy: Secondary | ICD-10-CM | POA: Diagnosis not present

## 2020-10-30 DIAGNOSIS — S81811A Laceration without foreign body, right lower leg, initial encounter: Secondary | ICD-10-CM | POA: Diagnosis not present

## 2020-10-31 ENCOUNTER — Encounter (HOSPITAL_BASED_OUTPATIENT_CLINIC_OR_DEPARTMENT_OTHER): Payer: Medicare HMO | Attending: Internal Medicine | Admitting: Internal Medicine

## 2020-11-01 DIAGNOSIS — E1122 Type 2 diabetes mellitus with diabetic chronic kidney disease: Secondary | ICD-10-CM | POA: Diagnosis not present

## 2020-11-01 DIAGNOSIS — L97822 Non-pressure chronic ulcer of other part of left lower leg with fat layer exposed: Secondary | ICD-10-CM | POA: Diagnosis not present

## 2020-11-01 DIAGNOSIS — L97909 Non-pressure chronic ulcer of unspecified part of unspecified lower leg with unspecified severity: Secondary | ICD-10-CM | POA: Diagnosis not present

## 2020-11-01 DIAGNOSIS — I872 Venous insufficiency (chronic) (peripheral): Secondary | ICD-10-CM | POA: Diagnosis not present

## 2020-11-01 DIAGNOSIS — I10 Essential (primary) hypertension: Secondary | ICD-10-CM | POA: Diagnosis not present

## 2020-11-01 DIAGNOSIS — L97812 Non-pressure chronic ulcer of other part of right lower leg with fat layer exposed: Secondary | ICD-10-CM | POA: Diagnosis not present

## 2020-11-01 DIAGNOSIS — E1142 Type 2 diabetes mellitus with diabetic polyneuropathy: Secondary | ICD-10-CM | POA: Diagnosis not present

## 2020-11-01 DIAGNOSIS — G8929 Other chronic pain: Secondary | ICD-10-CM | POA: Diagnosis not present

## 2020-11-01 DIAGNOSIS — G2581 Restless legs syndrome: Secondary | ICD-10-CM | POA: Diagnosis not present

## 2020-11-01 DIAGNOSIS — E2740 Unspecified adrenocortical insufficiency: Secondary | ICD-10-CM | POA: Diagnosis not present

## 2020-11-01 DIAGNOSIS — M1710 Unilateral primary osteoarthritis, unspecified knee: Secondary | ICD-10-CM | POA: Diagnosis not present

## 2020-11-01 DIAGNOSIS — E782 Mixed hyperlipidemia: Secondary | ICD-10-CM | POA: Diagnosis not present

## 2020-11-01 DIAGNOSIS — N183 Chronic kidney disease, stage 3 unspecified: Secondary | ICD-10-CM | POA: Diagnosis not present

## 2020-11-01 DIAGNOSIS — N1831 Chronic kidney disease, stage 3a: Secondary | ICD-10-CM | POA: Diagnosis not present

## 2020-11-01 DIAGNOSIS — I129 Hypertensive chronic kidney disease with stage 1 through stage 4 chronic kidney disease, or unspecified chronic kidney disease: Secondary | ICD-10-CM | POA: Diagnosis not present

## 2020-11-01 DIAGNOSIS — M810 Age-related osteoporosis without current pathological fracture: Secondary | ICD-10-CM | POA: Diagnosis not present

## 2020-11-01 DIAGNOSIS — E1143 Type 2 diabetes mellitus with diabetic autonomic (poly)neuropathy: Secondary | ICD-10-CM | POA: Diagnosis not present

## 2020-11-01 DIAGNOSIS — K219 Gastro-esophageal reflux disease without esophagitis: Secondary | ICD-10-CM | POA: Diagnosis not present

## 2020-11-02 DIAGNOSIS — N1831 Chronic kidney disease, stage 3a: Secondary | ICD-10-CM | POA: Diagnosis not present

## 2020-11-02 DIAGNOSIS — E2740 Unspecified adrenocortical insufficiency: Secondary | ICD-10-CM | POA: Diagnosis not present

## 2020-11-02 DIAGNOSIS — I872 Venous insufficiency (chronic) (peripheral): Secondary | ICD-10-CM | POA: Diagnosis not present

## 2020-11-02 DIAGNOSIS — E1143 Type 2 diabetes mellitus with diabetic autonomic (poly)neuropathy: Secondary | ICD-10-CM | POA: Diagnosis not present

## 2020-11-02 DIAGNOSIS — L97812 Non-pressure chronic ulcer of other part of right lower leg with fat layer exposed: Secondary | ICD-10-CM | POA: Diagnosis not present

## 2020-11-02 DIAGNOSIS — L97822 Non-pressure chronic ulcer of other part of left lower leg with fat layer exposed: Secondary | ICD-10-CM | POA: Diagnosis not present

## 2020-11-02 DIAGNOSIS — E1122 Type 2 diabetes mellitus with diabetic chronic kidney disease: Secondary | ICD-10-CM | POA: Diagnosis not present

## 2020-11-02 DIAGNOSIS — M1710 Unilateral primary osteoarthritis, unspecified knee: Secondary | ICD-10-CM | POA: Diagnosis not present

## 2020-11-02 DIAGNOSIS — I129 Hypertensive chronic kidney disease with stage 1 through stage 4 chronic kidney disease, or unspecified chronic kidney disease: Secondary | ICD-10-CM | POA: Diagnosis not present

## 2020-11-02 DIAGNOSIS — E1142 Type 2 diabetes mellitus with diabetic polyneuropathy: Secondary | ICD-10-CM | POA: Diagnosis not present

## 2020-11-03 DIAGNOSIS — I129 Hypertensive chronic kidney disease with stage 1 through stage 4 chronic kidney disease, or unspecified chronic kidney disease: Secondary | ICD-10-CM | POA: Diagnosis not present

## 2020-11-03 DIAGNOSIS — E1142 Type 2 diabetes mellitus with diabetic polyneuropathy: Secondary | ICD-10-CM | POA: Diagnosis not present

## 2020-11-03 DIAGNOSIS — M1710 Unilateral primary osteoarthritis, unspecified knee: Secondary | ICD-10-CM | POA: Diagnosis not present

## 2020-11-03 DIAGNOSIS — E1122 Type 2 diabetes mellitus with diabetic chronic kidney disease: Secondary | ICD-10-CM | POA: Diagnosis not present

## 2020-11-03 DIAGNOSIS — N1831 Chronic kidney disease, stage 3a: Secondary | ICD-10-CM | POA: Diagnosis not present

## 2020-11-03 DIAGNOSIS — I872 Venous insufficiency (chronic) (peripheral): Secondary | ICD-10-CM | POA: Diagnosis not present

## 2020-11-03 DIAGNOSIS — E2740 Unspecified adrenocortical insufficiency: Secondary | ICD-10-CM | POA: Diagnosis not present

## 2020-11-03 DIAGNOSIS — L97812 Non-pressure chronic ulcer of other part of right lower leg with fat layer exposed: Secondary | ICD-10-CM | POA: Diagnosis not present

## 2020-11-03 DIAGNOSIS — E1143 Type 2 diabetes mellitus with diabetic autonomic (poly)neuropathy: Secondary | ICD-10-CM | POA: Diagnosis not present

## 2020-11-03 DIAGNOSIS — L97822 Non-pressure chronic ulcer of other part of left lower leg with fat layer exposed: Secondary | ICD-10-CM | POA: Diagnosis not present

## 2020-11-06 DIAGNOSIS — I872 Venous insufficiency (chronic) (peripheral): Secondary | ICD-10-CM | POA: Diagnosis not present

## 2020-11-06 DIAGNOSIS — L97812 Non-pressure chronic ulcer of other part of right lower leg with fat layer exposed: Secondary | ICD-10-CM | POA: Diagnosis not present

## 2020-11-06 DIAGNOSIS — I129 Hypertensive chronic kidney disease with stage 1 through stage 4 chronic kidney disease, or unspecified chronic kidney disease: Secondary | ICD-10-CM | POA: Diagnosis not present

## 2020-11-06 DIAGNOSIS — M1710 Unilateral primary osteoarthritis, unspecified knee: Secondary | ICD-10-CM | POA: Diagnosis not present

## 2020-11-06 DIAGNOSIS — E1142 Type 2 diabetes mellitus with diabetic polyneuropathy: Secondary | ICD-10-CM | POA: Diagnosis not present

## 2020-11-06 DIAGNOSIS — E1122 Type 2 diabetes mellitus with diabetic chronic kidney disease: Secondary | ICD-10-CM | POA: Diagnosis not present

## 2020-11-06 DIAGNOSIS — L97822 Non-pressure chronic ulcer of other part of left lower leg with fat layer exposed: Secondary | ICD-10-CM | POA: Diagnosis not present

## 2020-11-06 DIAGNOSIS — E1143 Type 2 diabetes mellitus with diabetic autonomic (poly)neuropathy: Secondary | ICD-10-CM | POA: Diagnosis not present

## 2020-11-06 DIAGNOSIS — E2740 Unspecified adrenocortical insufficiency: Secondary | ICD-10-CM | POA: Diagnosis not present

## 2020-11-06 DIAGNOSIS — N1831 Chronic kidney disease, stage 3a: Secondary | ICD-10-CM | POA: Diagnosis not present

## 2020-11-07 ENCOUNTER — Other Ambulatory Visit: Payer: Self-pay

## 2020-11-07 ENCOUNTER — Encounter (HOSPITAL_BASED_OUTPATIENT_CLINIC_OR_DEPARTMENT_OTHER): Payer: Medicare HMO | Attending: Internal Medicine | Admitting: Internal Medicine

## 2020-11-07 DIAGNOSIS — I129 Hypertensive chronic kidney disease with stage 1 through stage 4 chronic kidney disease, or unspecified chronic kidney disease: Secondary | ICD-10-CM | POA: Insufficient documentation

## 2020-11-07 DIAGNOSIS — Z882 Allergy status to sulfonamides status: Secondary | ICD-10-CM | POA: Insufficient documentation

## 2020-11-07 DIAGNOSIS — L97811 Non-pressure chronic ulcer of other part of right lower leg limited to breakdown of skin: Secondary | ICD-10-CM | POA: Diagnosis not present

## 2020-11-07 DIAGNOSIS — Z7952 Long term (current) use of systemic steroids: Secondary | ICD-10-CM | POA: Diagnosis not present

## 2020-11-07 DIAGNOSIS — E1139 Type 2 diabetes mellitus with other diabetic ophthalmic complication: Secondary | ICD-10-CM | POA: Insufficient documentation

## 2020-11-07 DIAGNOSIS — N183 Chronic kidney disease, stage 3 unspecified: Secondary | ICD-10-CM | POA: Insufficient documentation

## 2020-11-07 DIAGNOSIS — Z886 Allergy status to analgesic agent status: Secondary | ICD-10-CM | POA: Insufficient documentation

## 2020-11-07 DIAGNOSIS — E11622 Type 2 diabetes mellitus with other skin ulcer: Secondary | ICD-10-CM | POA: Insufficient documentation

## 2020-11-07 DIAGNOSIS — Z888 Allergy status to other drugs, medicaments and biological substances status: Secondary | ICD-10-CM | POA: Insufficient documentation

## 2020-11-07 DIAGNOSIS — Z881 Allergy status to other antibiotic agents status: Secondary | ICD-10-CM | POA: Insufficient documentation

## 2020-11-07 DIAGNOSIS — E2740 Unspecified adrenocortical insufficiency: Secondary | ICD-10-CM | POA: Diagnosis not present

## 2020-11-07 DIAGNOSIS — Z885 Allergy status to narcotic agent status: Secondary | ICD-10-CM | POA: Diagnosis not present

## 2020-11-07 DIAGNOSIS — I7300 Raynaud's syndrome without gangrene: Secondary | ICD-10-CM | POA: Insufficient documentation

## 2020-11-07 DIAGNOSIS — H42 Glaucoma in diseases classified elsewhere: Secondary | ICD-10-CM | POA: Insufficient documentation

## 2020-11-07 DIAGNOSIS — E1122 Type 2 diabetes mellitus with diabetic chronic kidney disease: Secondary | ICD-10-CM | POA: Diagnosis not present

## 2020-11-07 DIAGNOSIS — L97821 Non-pressure chronic ulcer of other part of left lower leg limited to breakdown of skin: Secondary | ICD-10-CM | POA: Insufficient documentation

## 2020-11-07 DIAGNOSIS — I872 Venous insufficiency (chronic) (peripheral): Secondary | ICD-10-CM | POA: Diagnosis not present

## 2020-11-07 DIAGNOSIS — E1140 Type 2 diabetes mellitus with diabetic neuropathy, unspecified: Secondary | ICD-10-CM | POA: Insufficient documentation

## 2020-11-07 DIAGNOSIS — D869 Sarcoidosis, unspecified: Secondary | ICD-10-CM | POA: Insufficient documentation

## 2020-11-08 NOTE — Progress Notes (Signed)
Sonya Goodwin, Sonya Goodwin ([id number]) Visit Report for 11/07/2020 HPI Details Patient Name: Date of Service: Sonya Goodwin, [name] 11/07/2020 1:30 PM Medical Record Number: [id number] Patient Account Number: [id number] Date of Birth/Sex: Treating RN: [birth year] (79 y.o. [name] Primary Care Provider: Nicholes Stairs Other Clinician: Referring Provider: Treating Provider/Extender: Nyra Jabs in Treatment: 10 History of Present Illness HPI Description: We think they have been using silver alginateADMISSION 08/29/2020 This is a 79 year old woman who has wounds on her left lower leg mid aspect. She states that these have been present since June when she was hospitalized with a UTI, delirium possibly medication issues. I do not see any mention of the wounds on her left leg at that time. In any case these develop sometime after this. The patient is followed by Dr. Jacelyn Grip at [place] at [facility]. She has been using bacitracin ointment to the wounds. She has [facility] home health although I am not exactly sure what they are doing. Her husband is angry about a bill from home health again we were not able to help him exactly. The patient has chronic sarcoidosis and adrenal insufficiency she has been on longstanding prednisone. She has all the classic skin features of chronic steroid use. I think this is the primary issue for these wounds. They have also noted weeping fluid coming out of the wounds and even weeping draining fluid on the right leg although we were not able to define any wound on the right leg. She could very well have a component of chronic venous insufficiency as well. Past medical history includes sarcoidosis, type 2 diabetes with a recent hemoglobin A1c of 6.3, diabetic neuropathy, stage III P chronic renal failure, hypertension, adrenal insufficiency. ABI on the right noncompressible on the left at 1.02 9/14 2 small punched out areas of the left anterior  mid tibia. We have been using Iodoflex. Better looking wound surfaces 9/28; the 2 areas on the left anterior mid tibia are about the same. She has a new weeping area on the right side just medial to the tibia. I think all of these wounds are weeping edema fluid from a collection of fluid in the upper third of her lower leg. The skin distally is more fibrosed and there is no room for weeping edema fluid. I wanted to put her in 3 layer compression versus kerlix and Coban but she will not agree to it 10/12; 2-week follow-up. She has the 2 areas on the left anterior mid tibia. There is scissor injury now on the left medial calf. In a single area on the right medial lower extremity. All of these are roughly the same small punched out surfaces. Weeping edema fluid. She does not tolerate anything more than kerlix Coban and even with this she finds this too tight often making her husband cut these off. I had ordered Iodoflex to these wounds but according to her husband that is not what home health is using. 11/9; patient missed her last appointment so she has not been seen here in almost a month. She has the 2 punched-out areas on the left mid tibia 1 medial and 1 lateral a small excoriation a little more superior and lateral also on the left but she has also punched out areas on the right that she did not have last time I am not exactly sure how this happened. She complains about pain in her feet at night especially in the morning. Not really claudication but I was  I was concerned. Her edema is satisfactorily concerned and the kerlix and Event organiser) Signed: 11/08/2020 9:36:19 AM By: Linton Ham MD Entered By: Linton Ham on 11/07/2020 15:57:39 -------------------------------------------------------------------------------- Physical Exam Details Patient Name: Date of Service: Sonya Raider S. 11/07/2020 1:30 PM Medical Record Number: [id number] Patient Account Number:  [id number] Date of Birth/Sex: Treating RN: May 26, 1941 (79 y.o. [name] Primary Care Provider: Nicholes Stairs Other Clinician: Referring Provider: Treating Provider/Extender: Nyra Jabs in Treatment: 10 Constitutional Patient is hypertensive.. Pulse regular and within target range for patient.Marland Kitchen Respirations regular, non-labored and within target range.. Temperature is normal and within the target range for the patient.Marland Kitchen Appears in no distress. Cardiovascular Popliteal pulses are palpable. Her pedal pulses are palpable at the dorsalis pedis and posterior tibia. She has a ruddy red color when her legs are dependent I think some of this is Raynaud's phenomenon and some of it is her chronic venous insufficiency.. Notes Wound exam; still a small circular punched out area x2 in the mid lower tibia however she has an additional wound laterally. Plus she has an area on the right medial and right lateral tibia. These do not look like venous ulcers Electronic Signature(s) Signed: 11/08/2020 9:36:19 AM By: Linton Ham MD Entered By: Linton Ham on 11/07/2020 15:59:07 -------------------------------------------------------------------------------- Physician Orders Details Patient Name: Date of Service: Sonya Raider S. 11/07/2020 1:30 PM Medical Record Number: [id number] Patient Account Number: [id number] Date of Birth/Sex: Treating RN: [birth year] (79 y.o. [name] Primary Care Provider: Nicholes Stairs Other Clinician: Referring Provider: Treating Provider/Extender: Nyra Jabs in Treatment: 10 Verbal / Phone Orders: No Diagnosis Coding ICD-10 Coding Code Description [phone] Non-pressure chronic ulcer of other part of left lower leg limited to breakdown of skin I87.2 Venous insufficiency (chronic) (peripheral) Z92.241 Personal history of systemic steroid therapy L97.819 Non-pressure chronic ulcer of other part of  right lower leg with unspecified severity Follow-up Appointments Return appointment in 1 month. Dressing Change Frequency Change dressing three times week. - ALL WOUNDS Wound #1 Left,Anterior Lower Leg Change dressing three times week. Wound Cleansing Clean wound with Normal Saline. - ALL WOUNDS Primary Wound Dressing lginate with Silver - ALL WOUNDS Calcium A Secondary Dressing Dry Gauze - ALL WOUNDS ABD pad - ALL WOUNDS Edema Control Wound #1 Left,Anterior Lower Leg Kerlix and Coban - Bilateral - [facility] Wound #1 Left,Anterior Lower Leg [place] skilled nursing for wound care. [place] [place] Wound #2 [place] skilled nursing for wound care. - [place] and Therapies rterial Studies- Bilateral with ABI/ TBI's - arterial dopplers with ABI and TBI's - (ICD10 I87.2 - Venous insufficiency (chronic) (peripheral)) A Electronic Signature(s) Signed: 11/08/2020 9:36:19 AM By: Linton Ham MD Signed: 11/08/2020 5:51:14 PM By: Carlene Coria RN Entered By: Carlene Coria on 11/07/2020 15:17:15 Prescription 11/07/2020 -------------------------------------------------------------------------------- Rachael Darby. Linton Ham MD Patient Name: Provider: 03-Aug-1941 [id number] Date of Birth: NPI#: [id number] Sex: DEA #: [id number] Phone #: License #: [place] Patient Address: [place], [place] [address] [place], [place] [address] [phone] Allergies metoclopramide; codeine; menthol; Statins-Hmg-Coa Reductase Inhibitors; alendronate sodium; aspirin; fentanyl; fenofibrate; Fosamax; losartan; Miacalcin; nitrofurantoin; NSAIDS (Non-Steroidal Anti-Inflammatory Drug); Prolia; Sulfa (Sulfonamide Antibiotics); tolmetin; WelChol; Zetia; [place] Provider's Orders rterial Studies- Bilateral with ABI/ TBI's - ICD10: I87.2 -  arterial dopplers with ABI and TBI's A Hand Signature:  Date(s): Electronic Signature(s) Signed: 11/08/2020 9:36:19 AM By: Linton Ham MD Signed: 11/08/2020 5:51:14 PM By: Carlene Coria RN Entered By: Carlene Coria on 11/07/2020 15:17:17 -------------------------------------------------------------------------------- Problem List Details Patient Name: Date of Service: [place], [place]. 11/07/2020 1:30 PM Medical Record Number: [id number] Patient Account Number: [id number] Date of Birth/Sex: Treating RN: January 11, 1941 (79 y.o. [name] Primary Care Provider: Nicholes Stairs Other Clinician: Referring Provider: Treating Provider/Extender: Nyra Jabs in Treatment: 10 Active Problems ICD-10 Encounter Code Description Active Date MDM Diagnosis [phone] Non-pressure chronic ulcer of other part of left lower leg limited to breakdown 08/29/2020 No Yes of skin I87.2 Venous insufficiency (chronic) (peripheral) 08/29/2020 No Yes Z92.241 Personal history of systemic steroid therapy 08/29/2020 No Yes L97.819 Non-pressure chronic ulcer of other part of right lower leg with unspecified 09/26/2020 No Yes severity Inactive Problems Resolved Problems Electronic Signature(s) Signed: 11/08/2020 9:36:19 AM By: Linton Ham MD Entered By: Linton Ham on 11/07/2020 15:56:02 -------------------------------------------------------------------------------- Progress Note Details Patient Name: Date of Service: Sonya Raider S. 11/07/2020 1:30 PM Medical Record Number: [id number] Patient Account Number: [id number] Date of Birth/Sex: Treating RN: [birth year] (79 y.o. [name] Primary Care Provider: Nicholes Stairs Other Clinician: Referring Provider: Treating Provider/Extender: Nyra Jabs in Treatment: 10 Subjective History of Present Illness (HPI) We think they have been using silver alginateADMISSION 08/29/2020 This is a  79 year old woman who has wounds on her left lower leg mid aspect. She states that these have been present since June when she was hospitalized with a UTI, delirium possibly medication issues. I do not see any mention of the wounds on her left leg at that time. In any case these develop sometime after this. The patient is followed by Dr. Jacelyn Grip at [place] at [facility]. She has been using bacitracin ointment to the wounds. She has [facility] home health although I am not exactly sure what they are doing. Her husband is angry about a bill from home health again we were not able to help him exactly. The patient has chronic sarcoidosis and adrenal insufficiency she has been on longstanding prednisone. She has all the classic skin features of chronic steroid use. I think this is the primary issue for these wounds. They have also noted weeping fluid coming out of the wounds and even weeping draining fluid on the right leg although we were not able to define any wound on the right leg. She could very well have a component of chronic venous insufficiency as well. Past medical history includes sarcoidosis, type 2 diabetes with a recent hemoglobin A1c of 6.3, diabetic neuropathy, stage III P chronic renal failure, hypertension, adrenal insufficiency. ABI on the right noncompressible on the left at 1.02 9/14 2 small punched out areas of the left anterior mid tibia. We have been using Iodoflex. Better looking wound surfaces 9/28; the 2 areas on the left anterior mid tibia are about the same. She has a new weeping area on the right side just medial to the tibia. I think all of these wounds are weeping edema fluid from a collection of fluid in the upper third of her lower leg. The skin distally is more fibrosed and there is no room for weeping edema fluid. I wanted to put her in 3 layer compression versus kerlix and Coban but she will not agree to it 10/12; 2-week follow-up. She has the 2 areas on the left anterior  mid tibia. There is scissor injury now on the  left medial calf. In a single area on the right medial lower extremity. All of these are roughly the same small punched out surfaces. Weeping edema fluid. She does not tolerate anything more than kerlix Coban and even with this she finds this too tight often making her husband cut these off. I had ordered Iodoflex to these wounds but according to her husband that is not what home health is using. 11/9; patient missed her last appointment so she has not been seen here in almost a month. She has the 2 punched-out areas on the left mid tibia 1 medial and 1 lateral a small excoriation a little more superior and lateral also on the left but she has also punched out areas on the right that she did not have last time I am not exactly sure how this happened. She complains about pain in her feet at night especially in the morning. Not really claudication but I was I was concerned. Her edema is satisfactorily concerned and the kerlix and Coban Objective Constitutional Patient is hypertensive.. Pulse regular and within target range for patient.Marland Kitchen Respirations regular, non-labored and within target range.. Temperature is normal and within the target range for the patient.Marland Kitchen Appears in no distress. Vitals Time Taken: 1:58 PM, Height: 66 in, Source: Stated, Weight: 141 lbs, Source: Stated, BMI: 22.8, Temperature: 97.5 F, Pulse: 112 bpm, Respiratory Rate: 18 breaths/min, Blood Pressure: 138/93 mmHg. General Notes: pt has not been checking blood sugars at home regularly Cardiovascular Popliteal pulses are palpable. Her pedal pulses are palpable at the dorsalis pedis and posterior tibia. She has a ruddy red color when her legs are dependent I think some of this is Raynaud's phenomenon and some of it is her chronic venous insufficiency.. General Notes: Wound exam; still a small circular punched out area x2 in the mid lower tibia however she has an additional wound  laterally. Plus she has an area on the right medial and right lateral tibia. These do not look like venous ulcers Integumentary (Hair, Skin) Wound #1 status is Open. Original cause of wound was Gradually Appeared. The wound is located on the Left,Anterior Lower Leg. The wound measures 0.9cm length x 0.8cm width x 0.1cm depth; 0.565cm^2 area and 0.057cm^3 volume. There is Fat Layer (Subcutaneous Tissue) exposed. There is no tunneling noted, however, there is undermining starting at 12:00 and ending at 12:00 with a maximum distance of 0.7cm. There is a small amount of serosanguineous drainage noted. The wound margin is flat and intact. There is medium (34-66%) red granulation within the wound bed. There is a medium (34-66%) amount of necrotic tissue within the wound bed including Adherent Slough. Wound #2 status is Open. Original cause of wound was Gradually Appeared. The wound is located on the Left,Lateral Lower Leg. The wound measures 0.5cm length x 0.6cm width x 0.1cm depth; 0.236cm^2 area and 0.024cm^3 volume. There is Fat Layer (Subcutaneous Tissue) exposed. There is no tunneling or undermining noted. There is a small amount of serosanguineous drainage noted. The wound margin is flat and intact. There is medium (34-66%) red granulation within the wound bed. There is a medium (34-66%) amount of necrotic tissue within the wound bed including Adherent Slough. Wound #3 status is Open. Original cause of wound was Trauma. The wound is located on the Right,Anterior Lower Leg. The wound measures 1cm length x 0.3cm width x 0.1cm depth; 0.236cm^2 area and 0.024cm^3 volume. There is Fat Layer (Subcutaneous Tissue) exposed. There is no tunneling or undermining noted. There is a  small amount of serous drainage noted. The wound margin is flat and intact. There is medium (34-66%) red granulation within the wound bed. There is a medium (34-66%) amount of necrotic tissue within the wound bed including Adherent  Slough. Wound #4 status is Healed - Epithelialized. Original cause of wound was Trauma. The wound is located on the Left,Proximal,Lateral Lower Leg. The wound measures 0cm length x 0cm width x 0cm depth; 0cm^2 area and 0cm^3 volume. There is no tunneling or undermining noted. There is a none present amount of drainage noted. The wound margin is flat and intact. There is no granulation within the wound bed. There is no necrotic tissue within the wound bed. Wound #5 status is Open. Original cause of wound was Trauma. The wound is located on the Right,Lateral Lower Leg. The wound measures 1cm length x 0.9cm width x 0.1cm depth; 0.707cm^2 area and 0.071cm^3 volume. There is Fat Layer (Subcutaneous Tissue) exposed. There is no tunneling or undermining noted. There is a small amount of serosanguineous drainage noted. The wound margin is flat and intact. There is medium (34-66%) red granulation within the wound bed. There is a medium (34-66%) amount of necrotic tissue within the wound bed including Adherent Slough. Wound #6 status is Open. Original cause of wound was Gradually Appeared. The wound is located on the Left,Medial Lower Leg. The wound measures 1.4cm length x 0.7cm width x 0.2cm depth; 0.77cm^2 area and 0.154cm^3 volume. There is Fat Layer (Subcutaneous Tissue) exposed. There is no tunneling noted, however, there is undermining starting at 1:00 and ending at 9:00 with a maximum distance of 0.6cm. There is a small amount of serosanguineous drainage noted. The wound margin is well defined and not attached to the wound base. There is large (67-100%) pink granulation within the wound bed. There is a small (1- 33%) amount of necrotic tissue within the wound bed including Adherent Slough. Assessment Active Problems ICD-10 Non-pressure chronic ulcer of other part of left lower leg limited to breakdown of skin Venous insufficiency (chronic) (peripheral) Personal history of systemic steroid  therapy Non-pressure chronic ulcer of other part of right lower leg with unspecified severity Procedures Wound #1 Pre-procedure diagnosis of Wound #1 is a Venous Leg Ulcer located on the Left,Anterior Lower Leg . There was a Double Layer Compression Therapy Procedure by Carlene Coria, RN. Post procedure Diagnosis Wound #1: Same as Pre-Procedure Wound #2 Pre-procedure diagnosis of Wound #2 is a Diabetic Wound/Ulcer of the Lower Extremity located on the Left,Lateral Lower Leg . There was a Double Layer Compression Therapy Procedure by Carlene Coria, RN. Post procedure Diagnosis Wound #2: Same as Pre-Procedure Wound #3 Pre-procedure diagnosis of Wound #3 is a Skin T located on the Right,Anterior Lower Leg . There was a Double Layer Compression Therapy Procedure by ear Carlene Coria, RN. Post procedure Diagnosis Wound #3: Same as Pre-Procedure Wound #5 Pre-procedure diagnosis of Wound #5 is a Skin T located on the Right,Lateral Lower Leg . There was a Double Layer Compression Therapy Procedure by ear Carlene Coria, RN. Post procedure Diagnosis Wound #5: Same as Pre-Procedure Wound #6 Pre-procedure diagnosis of Wound #6 is a Venous Leg Ulcer located on the Left,Medial Lower Leg . There was a Double Layer Compression Therapy Procedure by Carlene Coria, RN. Post procedure Diagnosis Wound #6: Same as Pre-Procedure Plan Follow-up Appointments: Return appointment in 1 month. Dressing Change Frequency: Change dressing three times week. - ALL WOUNDS Wound #1 Left,Anterior Lower Leg: Change dressing three times week. Wound Cleansing: Clean wound  with Normal Saline. - ALL WOUNDS Primary Wound Dressing: Calcium Alginate with Silver - ALL WOUNDS Secondary Dressing: Dry Gauze - ALL WOUNDS ABD pad - ALL WOUNDS Edema Control: Wound #1 Left,Anterior Lower Leg: Kerlix and Coban - Bilateral - [facility] Home Health: Wound #1 Left,Anterior Lower Leg: [place] skilled nursing for wound care.  [facility] Wound #2 Left,Lateral Lower Leg: [place] skilled nursing for wound care. - [place] and Therapies ordered were: Arterial Studies- Bilateral with ABI/ TBI's - arterial dopplers with ABI and TBI's 1. I continued with the silver alginate. We had previously used Iodoflex and Prisma without improvement 2. I think the patient definitely has chronic venous insufficiency however I am concerned today about possibility of coexistent arterial insufficiency even though I can feel her peripheral pulses. Her feet clearly have what looks to be Raynaud's phenomenon and she says that she has had this for a long time nevertheless with small punched-out areas and new ones today I want to make sure that we are not overlooking the obvious 3. She complains of pain in her feet at night. When I walked in the room today she had her feet dangling over the side of the exam table. This might represent PAD and I am going to check for this. I need ABIs TBI's and arterial Dopplers. Electronic Signature(s) Signed: 11/08/2020 9:36:19 AM By: Linton Ham MD Entered By: Linton Ham on 11/07/2020 16:00:50 -------------------------------------------------------------------------------- SuperBill Details Patient Name: Date of Service: [name] 11/07/2020 Medical Record Number: [id number] Patient Account Number: [id number] Date of Birth/Sex: Treating RN: 1941-11-15 (79 y.o. [name] Primary Care Provider: Nicholes Stairs Other Clinician: Referring Provider: Treating Provider/Extender: Nyra Jabs in Treatment: 10 Diagnosis Coding ICD-10 Codes Code Description [phone] Non-pressure chronic ulcer of other part of left lower leg limited to breakdown of skin I87.2 Venous insufficiency (chronic) (peripheral) Z92.241 Personal history of systemic steroid therapy L97.819 Non-pressure chronic ulcer of other part of right lower leg with unspecified  severity Facility Procedures CPT4: Code [id number] foot Description: 1 BILATERAL: Application of multi-layer venous compression system; leg (below knee), including ankle and . Modifier: 1 Quantity: Physician Procedures : CPT4 Code Description Modifier [id number] - WC PHYS LEVEL 3 - EST PT ICD-10 Diagnosis Description L97.821 Non-pressure chronic ulcer of other part of left lower leg limited to breakdown of skin L97.819 Non-pressure chronic ulcer of other part of  right lower leg with unspecified severity I87.2 Venous insufficiency (chronic) (peripheral) Quantity: 1 Electronic Signature(s) Signed: 11/08/2020 9:36:19 AM By: Linton Ham MD Entered By: Linton Ham on 11/07/2020 16:01:15

## 2020-11-08 NOTE — Progress Notes (Signed)
Sonya Goodwin, Sonya Goodwin ([id number]) Visit Report for 11/07/2020 Arrival Information Details Patient Name: Date of Service: [place], [name] 11/07/2020 1:30 PM Medical Record Number: [id number] Patient Account Number: [id number] Date of Birth/Sex: Treating RN: October 02, 1941 (79 y.o. Martyn Malay, Vaughan Basta Primary Care Toniqua Melamed: Nicholes Stairs Other Clinician: Referring Tom Macpherson: Treating Skyelar Swigart/Extender: Nyra Jabs in Treatment: 10 Visit Information History Since Last Visit Added or deleted any medications: No Patient Arrived: Wheel Chair Any new allergies or adverse reactions: No Arrival Time: 13:57 Had a fall or experienced change in No Accompanied By: spouse activities of daily living that may affect Transfer Assistance: None risk of falls: Patient Identification Verified: Yes Signs or symptoms of abuse/neglect since last visito No Secondary Verification Process Completed: Yes Hospitalized since last visit: No Patient Requires Transmission-Based Precautions: No Implantable device outside of the clinic excluding No Patient Has Alerts: No cellular tissue based products placed in the center since last visit: Has Dressing in Place as Prescribed: Yes Has Compression in Place as Prescribed: Yes Pain Present Now: Yes Electronic Signature(s) Signed: 11/08/2020 5:57:36 PM By: Baruch Gouty RN, BSN Entered By: Baruch Gouty on 11/07/2020 13:58:08 -------------------------------------------------------------------------------- Compression Therapy Details Patient Name: Date of Service: Sonya Raider S. 11/07/2020 1:30 PM Medical Record Number: [id number] Patient Account Number: [id number] Date of Birth/Sex: Treating RN: Jul 12, 1941 (79 y.o. [name] Primary Care Zara Wendt: Nicholes Stairs Other Clinician: Referring Tyneshia Stivers: Treating Kahne Helfand/Extender: Nyra Jabs in Treatment: 10 Compression Therapy Performed for Wound  Assessment: Wound #1 Left,Anterior Lower Leg Performed By: Clinician Carlene Coria, RN Compression Type: Double Layer Post Procedure Diagnosis Same as Pre-procedure Electronic Signature(s) Signed: 11/08/2020 5:51:14 PM By: Carlene Coria RN Entered By: Carlene Coria on 11/07/2020 15:13:05 -------------------------------------------------------------------------------- Compression Therapy Details Patient Name: Date of Service: Sonya Goodwin, Sonya Goodwin 11/07/2020 1:30 PM Medical Record Number: [id number] Patient Account Number: [id number] Date of Birth/Sex: Treating RN: September 20, 1941 (80 y.o. [name] Primary Care Len Kluver: Nicholes Stairs Other Clinician: Referring Ramelo Oetken: Treating Jaxton Casale/Extender: Nyra Jabs in Treatment: 10 Compression Therapy Performed for Wound Assessment: Wound #2 Left,Lateral Lower Leg Performed By: Clinician Carlene Coria, RN Compression Type: Double Layer Post Procedure Diagnosis Same as Pre-procedure Electronic Signature(s) Signed: 11/08/2020 5:51:14 PM By: Carlene Coria RN Entered By: Carlene Coria on 11/07/2020 15:13:06 -------------------------------------------------------------------------------- Compression Therapy Details Patient Name: Date of Service: Sonya Goodwin, Sonya Goodwin 11/07/2020 1:30 PM Medical Record Number: [id number] Patient Account Number: [id number] Date of Birth/Sex: Treating RN: January 12, 1941 (79 y.o. [name] Primary Care Rula Keniston: Nicholes Stairs Other Clinician: Referring Kayzlee Wirtanen: Treating Kaheem Halleck/Extender: Nyra Jabs in Treatment: 10 Compression Therapy Performed for Wound Assessment: Wound #3 Right,Anterior Lower Leg Performed By: Clinician Carlene Coria, RN Compression Type: Double Layer Post Procedure Diagnosis Same as Pre-procedure Electronic Signature(s) Signed: 11/08/2020 5:51:14 PM By: Carlene Coria RN Entered By: Carlene Coria on 11/07/2020  15:13:06 -------------------------------------------------------------------------------- Compression Therapy Details Patient Name: Date of Service: Sonya Goodwin, Sonya Goodwin 11/07/2020 1:30 PM Medical Record Number: [id number] Patient Account Number: [id number] Date of Birth/Sex: Treating RN: 24-Feb-1941 (79 y.o. [name] Primary Care Ancel Easler: Nicholes Stairs Other Clinician: Referring Dessirae Scarola: Treating Wilmont Olund/Extender: Nyra Jabs in Treatment: 10 Compression Therapy Performed for Wound Assessment: Wound #5 Right,Lateral Lower Leg Performed By: Clinician Carlene Coria, RN Compression Type: Double Layer Post Procedure Diagnosis Same as Pre-procedure Electronic Signature(s) Signed: 11/08/2020 5:51:14 PM By: Carlene Coria RN Entered By: Carlene Coria on  11/07/2020 15:13:06 -------------------------------------------------------------------------------- Compression Therapy Details Patient Name: Date of Service: Sonya Goodwin, LIBBEY 11/07/2020 1:30 PM Medical Record Number: [id number] Patient Account Number: [id number] Date of Birth/Sex: Treating RN: 1941-02-07 (79 y.o. [name] Primary Care Joseguadalupe Stan: Nicholes Stairs Other Clinician: Referring Sinahi Knights: Treating Dorinda Stehr/Extender: Nyra Jabs in Treatment: 10 Compression Therapy Performed for Wound Assessment: Wound #6 Left,Medial Lower Leg Performed By: Clinician Carlene Coria, RN Compression Type: Double Layer Post Procedure Diagnosis Same as Pre-procedure Electronic Signature(s) Signed: 11/08/2020 5:51:14 PM By: Carlene Coria RN Entered By: Carlene Coria on 11/07/2020 15:13:06 -------------------------------------------------------------------------------- Encounter Discharge Information Details Patient Name: Date of Service: Sonya Goodwin. 11/07/2020 1:30 PM Medical Record Number: [id number] Patient Account Number: [id number] Date of Birth/Sex: Treating  RN: December 13, 1941 (79 y.o. Helene Shoe, Tammi Klippel Primary Care Khadar Monger: Nicholes Stairs Other Clinician: Referring Cristan Scherzer: Treating Arpita Fentress/Extender: Nyra Jabs in Treatment: 10 Encounter Discharge Information Items Discharge Condition: Stable Ambulatory Status: Ambulatory Discharge Destination: Home Transportation: Private Auto Accompanied By: self Schedule Follow-up Appointment: Yes Clinical Summary of Care: Electronic Signature(s) Signed: 11/07/2020 5:43:21 PM By: Deon Pilling Entered By: Deon Pilling on 11/07/2020 16:35:16 -------------------------------------------------------------------------------- Lower Extremity Assessment Details Patient Name: Date of Service: Sonya Goodwin, Sonya Goodwin 11/07/2020 1:30 PM Medical Record Number: [id number] Patient Account Number: [id number] Date of Birth/Sex: Treating RN: 30-Dec-1941 (79 y.o. [name] Primary Care Payton Prinsen: Nicholes Stairs Other Clinician: Referring Jenet Durio: Treating Ta Fair/Extender: Nyra Jabs in Treatment: 10 Edema Assessment Assessed: [name]: No] [name]: No] Edema: [Left: Yes] [Right: Yes] Calf Left: Right: Point of Measurement: From Medial Instep 25.5 cm 21.9 cm Ankle Left: Right: Point of Measurement: From Medial Instep 15.5 cm 16.5 cm Vascular Assessment Pulses: Dorsalis Pedis Palpable: [Left:Yes] [Right:Yes] Electronic Signature(s) Signed: 11/08/2020 5:57:36 PM By: Baruch Gouty RN, BSN Entered By: Baruch Gouty on 11/07/2020 14:02:17 -------------------------------------------------------------------------------- Multi Wound Chart Details Patient Name: Date of Service: Sonya Raider S. 11/07/2020 1:30 PM Medical Record Number: [id number] Patient Account Number: [id number] Date of Birth/Sex: Treating RN: 12/20/1941 (79 y.o. [name] Primary Care Presly Steinruck: Nicholes Stairs Other Clinician: Referring Lanaiya Lantry: Treating  Murl Golladay/Extender: Nyra Jabs in Treatment: 10 Vital Signs Height(in): 88 Pulse(bpm): 112 Weight(lbs): 141 Blood Pressure(mmHg): 138/93 Body Mass Index(BMI): 23 Temperature(F): 97.5 Respiratory Rate(breaths/min): 18 Photos: [1:No Photos Left, Anterior Lower Leg] [2:No Photos Left, Lateral Lower Leg] [3:No Photos Right, Anterior Lower Leg] Wound Location: [1:Gradually Appeared] [2:Gradually Appeared] [3:Trauma] Wounding Event: [1:Venous Leg Ulcer] [2:Diabetic Wound/Ulcer of the Lower] [3:Skin Tear] Primary Etiology: [1:N/A] [2:Extremity Venous Leg Ulcer] [3:Venous Leg Ulcer] Secondary Etiology: [1:Cataracts, Glaucoma, Hypertension, Cataracts, Glaucoma, Hypertension,] [3:Cataracts, Glaucoma, Hypertension,] Comorbid History: [1:Type II Diabetes, Osteoarthritis, Type II Diabetes, Osteoarthritis, Neuropathy 06/15/2020] [2:Neuropathy 06/15/2020] [3:Type II Diabetes, Osteoarthritis, Neuropathy 09/24/2020] Date Acquired: [1:10] [2:10] [3:6] Weeks of Treatment: [1:Open] [2:Open] [3:Open] Wound Status: [1:0.9x0.8x0.1] [2:0.5x0.6x0.1] [3:1x0.3x0.1] Measurements L x W x D (cm) [1:0.565] [2:0.236] [3:0.236] A (cm) : rea [1:0.057] [2:0.024] [3:0.024] Volume (cm) : [1:60.00%] [2:69.40%] [3:N/A] % Reduction in Area: [1:59.60%] [2:68.80%] [3:N/A] % Reduction in Volume: [1:12] Starting Position 1 (o'clock): [1:12] Ending Position 1 (o'clock): [1:0.7] Maximum Distance 1 (cm): [1:Yes] [2:No] [3:No] Undermining: [1:Full Thickness Without Exposed] [2:Grade 1] [3:Full Thickness Without Exposed] Classification: [1:Support Structures Small] [2:Small] [3:Support Structures Small] Exudate Amount: [1:Serosanguineous] [2:Serosanguineous] [3:Serous] Exudate Type: [1:red, brown] [2:red, brown] [3:amber] Exudate Color: [1:Flat and Intact] [2:Flat and Intact] [3:Flat and Intact] Wound Margin: [1:Medium (34-66%)] [2:Medium (34-66%)] [3:Medium (34-66%)] Granulation  Amount: [1:Red] [2:Red]  [3:Red] Granulation Quality: [1:Medium (34-66%)] [2:Medium (34-66%)] [3:Medium (34-66%)] Necrotic Amount: [1:Fat Layer (Subcutaneous Tissue): Yes Fat Layer (Subcutaneous Tissue): Yes Fat Layer (Subcutaneous Tissue): Yes] Exposed Structures: [1:Fascia: No Tendon: No Muscle: No Joint: No Bone: No None] [2:Fascia: No Tendon: No Muscle: No Joint: No Bone: No Small (1-33%)] [3:Fascia: No Tendon: No Muscle: No Joint: No Bone: No Small (1-33%)] Epithelialization: [1:Compression Therapy] [2:Compression Therapy] [3:Compression [place] Wound Number: [id number] 6  Photos: No Photos No Photos No Photos Left, Proximal, Lateral Lower Leg Right, Lateral Lower Leg Left, Medial Lower Leg Wound Location: Trauma Trauma Gradually Appeared Wounding Event: Skin Tear Skin Tear Venous Leg Ulcer Primary Etiology: N/A N/A N/A Secondary Etiology: Cataracts, Glaucoma, Hypertension, Cataracts, Glaucoma, Hypertension, Cataracts, Glaucoma, Hypertension, Comorbid History: Type II Diabetes, Osteoarthritis, Type II Diabetes, Osteoarthritis, Type II Diabetes, Osteoarthritis, Neuropathy Neuropathy Neuropathy 10/10/2020 10/03/2020 11/07/2020 Date Acquired: 4 4 0 Weeks of Treatment: Healed - Epithelialized Open Open Wound Status: 0x0x0 1x0.9x0.1 1.4x0.7x0.2 Measurements L x W x D (cm) 0 0.707 0.77 A (cm) : rea 0 0.071 0.154 Volume (cm) : 100.00% -12.60% N/A % Reduction in A rea: 100.00% -12.70% N/A % Reduction in Volume: 1 Starting Position 1 (o'clock): 9 Ending Position 1 (o'clock): 0.6 Maximum Distance 1 (cm): No No Yes Undermining: Full Thickness Without Exposed Full Thickness Without Exposed Full Thickness Without Exposed Classification: Support Structures Support Structures Support Structures None Present Small Small Exudate Amount: N/A Serosanguineous Serosanguineous Exudate Type: N/A red, brown red, brown Exudate Color: Flat and Intact Flat and Intact Well defined, not attached Wound Margin: None  Present (0%) Medium (34-66%) Large (67-100%) Granulation Amount: N/A Red Pink Granulation Quality: None Present (0%) Medium (34-66%) Small (1-33%) Necrotic Amount: Fascia: No Fat Layer (Subcutaneous Tissue): Yes Fat Layer (Subcutaneous Tissue): Yes Exposed Structures: Fat Layer (Subcutaneous Tissue): No Fascia: No Fascia: No Tendon: No Tendon: No Tendon: No Muscle: No Muscle: No Muscle: No Joint: No Joint: No Joint: No Bone: No Bone: No Bone: No Large (67-100%) Small (1-33%) N/A Epithelialization: N/A Compression Therapy Compression Therapy Procedures Performed: Treatment Notes Electronic Signature(s) Signed: 11/08/2020 9:36:19 AM By: Linton Ham MD Signed: 11/08/2020 5:51:14 PM By: Carlene Coria RN Entered By: Linton Ham on 11/07/2020 15:56:10 -------------------------------------------------------------------------------- Multi-Disciplinary Care Plan Details Patient Name: Date of Service: [place], [place]. 11/07/2020 1:30 PM Medical Record Number: [id number] Patient Account Number: [id number] Date of Birth/Sex: Treating RN: [birth year] (79 y.o. [name] Primary Care Maxton Noreen: Nicholes Stairs Other Clinician: Referring Juniper Cobey: Treating Simran Bomkamp/Extender: Nyra Jabs in Treatment: 10 Active Inactive Electronic Signature(s) Signed: 11/08/2020 5:51:14 PM By: Carlene Coria RN Entered By: Carlene Coria on 11/07/2020 15:12:16 -------------------------------------------------------------------------------- Pain Assessment Details Patient Name: Date of Service: Sonya Goodwin, Sonya Goodwin 11/07/2020 1:30 PM Medical Record Number: [id number] Patient Account Number: [id number] Date of Birth/Sex: Treating RN: [birth year] (79 y.o. [name] Primary Care Tametra Ahart: Nicholes Stairs Other Clinician: Referring Taran Hable: Treating Chandra Asher/Extender: Nyra Jabs in Treatment: 10 Active Problems Location  of Pain Severity and Description of Pain Patient Has Paino Yes Site Locations Pain Location: Pain in Ulcers With Dressing Change: Yes Duration of the Pain. Constant / Intermittento Intermittent Rate the pain. Current Pain Level: 3 Worst Pain Level: 8 Least Pain Level: 0 Character of Pain Describe the Pain: Aching, Sharp, Shooting Pain Management and Medication Current Pain Management: Medication: Yes Is the Current Pain Management Adequate: Adequate How does your wound impact your activities of daily livingo Sleep: Yes  Bathing: No Appetite: No Relationship With Others: No Bladder Continence: No Emotions: Yes Bowel Continence: No Hobbies: No Toileting: No Dressing: No Electronic Signature(s) Signed: 11/08/2020 5:57:36 PM By: Baruch Gouty RN, BSN Entered By: Baruch Gouty on 11/07/2020 14:00:39 -------------------------------------------------------------------------------- Patient/Caregiver Education Details Patient Name: Date of Service: Sonya Goodwin 11/9/2021andnbsp1:30 PM Medical Record Number: [id number] Patient Account Number: [id number] Date of Birth/Gender: Treating RN: Feb 22, 1941 (79 y.o. [name] Primary Care Physician: Nicholes Stairs Other Clinician: Referring Physician: Treating Physician/Extender: Nyra Jabs in Treatment: 10 Education Assessment Education Provided To: Patient Education Topics Provided Wound/Skin Impairment: Methods: Explain/Verbal Responses: State content correctly [employer]) Signed: 11/08/2020 5:51:14 PM By: Carlene Coria RN Entered By: Carlene Coria on 11/07/2020 13:09:27 -------------------------------------------------------------------------------- Wound Assessment Details Patient Name: Date of Service: Sonya Goodwin. 11/07/2020 1:30 PM Medical Record Number: [id number] Patient Account Number: [id number] Date of Birth/Sex: Treating RN: Dec 14, 1941 (79 y.o. [name] Primary Care Kelton Bultman: Nicholes Stairs Other Clinician: Referring Letonya Mangels: Treating Oliviah Agostini/Extender: Nyra Jabs in Treatment: 10 Wound Status Wound Number: 1 Primary Venous Leg Ulcer Etiology: Wound Location: Left, Anterior Lower Leg Wound Open Wounding Event: Gradually Appeared Status: Date Acquired: 06/15/2020 Comorbid Cataracts, Glaucoma, Hypertension, Type II Diabetes, Weeks Of Treatment: 10 History: Osteoarthritis, Neuropathy Clustered Wound: No Wound Measurements Length: (cm) 0.9 Width: (cm) 0.8 Depth: (cm) 0.1 Area: (cm) 0.565 Volume: (cm) 0.057 % Reduction in Area: 60% % Reduction in Volume: 59.6% Epithelialization: None Tunneling: No Undermining: Yes Starting Position (o'clock): 12 Ending Position (o'clock): 12 Maximum Distance: (cm) 0.7 Wound Description Classification: Full Thickness Without Exposed Support Structu Wound Margin: Flat and Intact Exudate Amount: Small Exudate Type: Serosanguineous Exudate Color: red, brown Wound Bed Granulation Amount: Medium (34-66%) Granulation Quality: Red Necrotic Amount: Medium (34-66%) Necrotic Quality: Adherent Slough res Foul Odor After Cleansing: No Slough/Fibrino Yes Exposed Structure Fascia Exposed: No Fat Layer (Subcutaneous Tissue) Exposed: Yes Tendon Exposed: No Muscle Exposed: No Joint Exposed: No Bone Exposed: No Treatment Notes Wound #1 (Left, Anterior Lower Leg) 1. Cleanse With Wound Cleanser Soap and water 3. Primary Dressing Applied Calcium Alginate Ag 4. Secondary Dressing Dry Gauze 6. Support Layer Applied Kerlix/Coban Notes netting. Electronic Signature(s) Signed: 11/08/2020 5:57:36 PM By: Baruch Gouty RN, BSN Entered By: Baruch Gouty on 11/07/2020 14:06:18 -------------------------------------------------------------------------------- Wound Assessment Details Patient Name: Date of Service: Sonya Raider S. 11/07/2020 1:30  PM Medical Record Number: [id number] Patient Account Number: [id number] Date of Birth/Sex: Treating RN: 24-Jul-1941 (79 y.o. [name] Primary Care Caylen Kuwahara: Nicholes Stairs Other Clinician: Referring Sylver Vantassell: Treating Naquita Nappier/Extender: Nyra Jabs in Treatment: 10 Wound Status Wound Number: 2 Primary Diabetic Wound/Ulcer of the Lower Extremity Etiology: Wound Location: Left, Lateral Lower Leg Secondary Venous Leg Ulcer Wounding Event: Gradually Appeared Etiology: Date Acquired: 06/15/2020 Wound Status: Open Weeks Of Treatment: 10 Comorbid Cataracts, Glaucoma, Hypertension, Type II Diabetes, Clustered Wound: No History: Osteoarthritis, Neuropathy Wound Measurements Length: (cm) 0.5 Width: (cm) 0.6 Depth: (cm) 0.1 Area: (cm) 0.236 Volume: (cm) 0.024 % Reduction in Area: 69.4% % Reduction in Volume: 68.8% Epithelialization: Small (1-33%) Tunneling: No Undermining: No Wound Description Classification: Grade 1 Wound Margin: Flat and Intact Exudate Amount: Small Exudate Type: Serosanguineous Exudate Color: red, brown Foul Odor After Cleansing: No Slough/Fibrino Yes Wound Bed Granulation Amount: Medium (34-66%) Exposed Structure Granulation Quality: Red Fascia Exposed: No Necrotic Amount: Medium (34-66%) Fat Layer (Subcutaneous Tissue) Exposed: Yes Necrotic Quality: Adherent Slough Tendon Exposed: No Muscle  Exposed: No Joint Exposed: No Bone Exposed: No Treatment Notes Wound #2 (Left, Lateral Lower Leg) 1. Cleanse With Wound Cleanser Soap and water 3. Primary Dressing Applied Calcium Alginate Ag 4. Secondary Dressing Dry Gauze 6. Support Layer Applied Kerlix/Coban Notes netting. Electronic Signature(s) Signed: 11/08/2020 5:57:36 PM By: Baruch Gouty RN, BSN Entered By: Baruch Gouty on 11/07/2020 14:08:01 -------------------------------------------------------------------------------- Wound Assessment  Details Patient Name: Date of Service: Sonya Raider S. 11/07/2020 1:30 PM Medical Record Number: [id number] Patient Account Number: [id number] Date of Birth/Sex: Treating RN: 12-07-1941 (79 y.o. [name] Primary Care Arby Dahir: Nicholes Stairs Other Clinician: Referring Esther Bradstreet: Treating Balian Schaller/Extender: Nyra Jabs in Treatment: 10 Wound Status Wound Number: 3 Primary Skin Tear Etiology: Wound Location: Right, Anterior Lower Leg Secondary Venous Leg Ulcer Wounding Event: Trauma Etiology: Date Acquired: 09/24/2020 Wound Status: Open Weeks Of Treatment: 6 Comorbid Cataracts, Glaucoma, Hypertension, Type II Diabetes, Clustered Wound: No History: Osteoarthritis, Neuropathy Wound Measurements Length: (cm) 1 Width: (cm) 0.3 Depth: (cm) 0.1 Area: (cm) 0.236 Volume: (cm) 0.024 % Reduction in Area: % Reduction in Volume: Epithelialization: Small (1-33%) Tunneling: No Undermining: No Wound Description Classification: Full Thickness Without Exposed Support Structures Wound Margin: Flat and Intact Exudate Amount: Small Exudate Type: Serous Exudate Color: amber Foul Odor After Cleansing: No Slough/Fibrino No Wound Bed Granulation Amount: Medium (34-66%) Exposed Structure Granulation Quality: Red Fascia Exposed: No Necrotic Amount: Medium (34-66%) Fat Layer (Subcutaneous Tissue) Exposed: Yes Necrotic Quality: Adherent Slough Tendon Exposed: No Muscle Exposed: No Joint Exposed: No Bone Exposed: No Treatment Notes Wound #3 (Right, Anterior Lower Leg) 1. Cleanse With Wound Cleanser Soap and water 3. Primary Dressing Applied Calcium Alginate Ag 4. Secondary Dressing Dry Gauze 6. Support Layer Applied Kerlix/Coban Notes netting. Electronic Signature(s) Signed: 11/08/2020 5:57:36 PM By: Baruch Gouty RN, BSN Entered By: Baruch Gouty on 11/07/2020  14:08:31 -------------------------------------------------------------------------------- Wound Assessment Details Patient Name: Date of Service: Sonya Raider S. 11/07/2020 1:30 PM Medical Record Number: [id number] Patient Account Number: [id number] Date of Birth/Sex: Treating RN: 1941/02/09 (79 y.o. [name] Primary Care Dalyn Kjos: Nicholes Stairs Other Clinician: Referring Dariah Mcsorley: Treating Chistian Kasler/Extender: Nyra Jabs in Treatment: 10 Wound Status Wound Number: 4 Primary Skin Tear Etiology: Wound Location: Left, Proximal, Lateral Lower Leg Wound Healed - Epithelialized Wounding Event: Trauma Status: Date Acquired: 10/10/2020 Comorbid Cataracts, Glaucoma, Hypertension, Type II Diabetes, Weeks Of Treatment: 4 History: Osteoarthritis, Neuropathy Clustered Wound: No Wound Measurements Length: (cm) Width: (cm) Depth: (cm) Area: (cm) Volume: (cm) 0 % Reduction in Area: 100% 0 % Reduction in Volume: 100% 0 Epithelialization: Large (67-100%) 0 Tunneling: No 0 Undermining: No Wound Description Classification: Full Thickness Without Exposed Support Structures Wound Margin: Flat and Intact Exudate Amount: None Present Foul Odor After Cleansing: No Slough/Fibrino No Wound Bed Granulation Amount: None Present (0%) Exposed Structure Necrotic Amount: None Present (0%) Fascia Exposed: No Fat Layer (Subcutaneous Tissue) Exposed: No Tendon Exposed: No Muscle Exposed: No Joint Exposed: No Bone Exposed: No Electronic Signature(s) Signed: 11/08/2020 5:57:36 PM By: Baruch Gouty RN, BSN Signed: 11/08/2020 5:57:36 PM By: Baruch Gouty RN, BSN Entered By: Baruch Gouty on 11/07/2020 14:08:49 -------------------------------------------------------------------------------- Wound Assessment Details Patient Name: Date of Service: Sonya Raider S. 11/07/2020 1:30 PM Medical Record Number: [id number] Patient Account Number:  [id number] Date of Birth/Sex: Treating RN: 10/18/1941 (79 y.o. [name] Primary Care Dayven Linsley: Nicholes Stairs Other Clinician: Referring Kaydence Menard: Treating Koran Seabrook/Extender: Nyra Jabs in Treatment: 10 Wound Status  Wound Number: 5 Primary Skin Tear Etiology: Wound Location: Right, Lateral Lower Leg Wound Open Wounding Event: Trauma Status: Date Acquired: 10/03/2020 Comorbid Cataracts, Glaucoma, Hypertension, Type II Diabetes, Weeks Of Treatment: 4 History: Osteoarthritis, Neuropathy Clustered Wound: No Wound Measurements Length: (cm) 1 Width: (cm) 0.9 Depth: (cm) 0.1 Area: (cm) 0.707 Volume: (cm) 0.071 % Reduction in Area: -12.6% % Reduction in Volume: -12.7% Epithelialization: Small (1-33%) Tunneling: No Undermining: No Wound Description Classification: Full Thickness Without Exposed Support Structures Wound Margin: Flat and Intact Exudate Amount: Small Exudate Type: Serosanguineous Exudate Color: red, brown Foul Odor After Cleansing: No Slough/Fibrino No Wound Bed Granulation Amount: Medium (34-66%) Exposed Structure Granulation Quality: Red Fascia Exposed: No Necrotic Amount: Medium (34-66%) Fat Layer (Subcutaneous Tissue) Exposed: Yes Necrotic Quality: Adherent Slough Tendon Exposed: No Muscle Exposed: No Joint Exposed: No Bone Exposed: No Treatment Notes Wound #5 (Right, Lateral Lower Leg) 1. Cleanse With Wound Cleanser Soap and water 3. Primary Dressing Applied Calcium Alginate Ag 4. Secondary Dressing Dry Gauze 6. Support Layer Applied Kerlix/Coban Notes netting. Electronic Signature(s) Signed: 11/08/2020 5:57:36 PM By: Baruch Gouty RN, BSN Entered By: Baruch Gouty on 11/07/2020 14:09:25 -------------------------------------------------------------------------------- Wound Assessment Details Patient Name: Date of Service: Sonya Raider S. 11/07/2020 1:30 PM Medical Record Number:  [id number] Patient Account Number: [id number] Date of Birth/Sex: Treating RN: December 25, 1941 (79 y.o. [name] Primary Care Lenora Gomes: Nicholes Stairs Other Clinician: Referring Pepper Wyndham: Treating Alyscia Carmon/Extender: Nyra Jabs in Treatment: 10 Wound Status Wound Number: 6 Primary Venous Leg Ulcer Etiology: Wound Location: Left, Medial Lower Leg Wound Open Wounding Event: Gradually Appeared Status: Date Acquired: 11/07/2020 Comorbid Cataracts, Glaucoma, Hypertension, Type II Diabetes, Weeks Of Treatment: 0 History: Osteoarthritis, Neuropathy Clustered Wound: No Wound Measurements Length: (cm) 1.4 Width: (cm) 0.7 Depth: (cm) 0.2 Area: (cm) 0.77 Volume: (cm) 0.154 % Reduction in Area: % Reduction in Volume: Tunneling: No Undermining: Yes Starting Position (o'clock): 1 Ending Position (o'clock): 9 Maximum Distance: (cm) 0.6 Wound Description Classification: Full Thickness Without Exposed Support Structures Wound Margin: Well defined, not attached Exudate Amount: Small Exudate Type: Serosanguineous Exudate Color: red, brown Foul Odor After Cleansing: No Slough/Fibrino Yes Wound Bed Granulation Amount: Large (67-100%) Exposed Structure Granulation Quality: Pink Fascia Exposed: No Necrotic Amount: Small (1-33%) Fat Layer (Subcutaneous Tissue) Exposed: Yes Necrotic Quality: Adherent Slough Tendon Exposed: No Muscle Exposed: No Joint Exposed: No Bone Exposed: No Treatment Notes Wound #6 (Left, Medial Lower Leg) 1. Cleanse With Wound Cleanser Soap and water 3. Primary Dressing Applied Calcium Alginate Ag 4. Secondary Dressing Dry Gauze 6. Support Layer Applied Kerlix/Coban Notes netting. Electronic Signature(s) Signed: 11/08/2020 5:57:36 PM By: Baruch Gouty RN, BSN Entered By: Baruch Gouty on 11/07/2020 14:11:23 -------------------------------------------------------------------------------- Vitals Details Patient  Name: Date of Service: [name], [name] 11/07/2020 1:30 PM Medical Record Number: [id number] Patient Account Number: [id number] Date of Birth/Sex: Treating RN: August 12, 1941 (79 y.o. [name] Primary Care Daire Okimoto: Nicholes Stairs Other Clinician: Referring Rakeen Gaillard: Treating Akhila Mahnken/Extender: Nyra Jabs in Treatment: 10 Vital Signs Time Taken: 13:58 Temperature (F): 97.5 Height (in): 66 Pulse (bpm): 112 Source: Stated Respiratory Rate (breaths/min): 18 Weight (lbs): 141 Blood Pressure (mmHg): 138/93 Source: Stated Reference Range: 80 - 120 mg / dl Body Mass Index (BMI): 22.8 Notes pt has not been checking blood sugars at home regularly Electronic Signature(s) Signed: 11/08/2020 5:57:36 PM By: Baruch Gouty RN, BSN Entered By: Baruch Gouty on 11/07/2020 13:59:17

## 2020-11-10 DIAGNOSIS — M1710 Unilateral primary osteoarthritis, unspecified knee: Secondary | ICD-10-CM | POA: Diagnosis not present

## 2020-11-10 DIAGNOSIS — E1122 Type 2 diabetes mellitus with diabetic chronic kidney disease: Secondary | ICD-10-CM | POA: Diagnosis not present

## 2020-11-10 DIAGNOSIS — N1831 Chronic kidney disease, stage 3a: Secondary | ICD-10-CM | POA: Diagnosis not present

## 2020-11-10 DIAGNOSIS — S81812A Laceration without foreign body, left lower leg, initial encounter: Secondary | ICD-10-CM | POA: Diagnosis not present

## 2020-11-10 DIAGNOSIS — E1143 Type 2 diabetes mellitus with diabetic autonomic (poly)neuropathy: Secondary | ICD-10-CM | POA: Diagnosis not present

## 2020-11-10 DIAGNOSIS — I872 Venous insufficiency (chronic) (peripheral): Secondary | ICD-10-CM | POA: Diagnosis not present

## 2020-11-10 DIAGNOSIS — E2740 Unspecified adrenocortical insufficiency: Secondary | ICD-10-CM | POA: Diagnosis not present

## 2020-11-10 DIAGNOSIS — L97812 Non-pressure chronic ulcer of other part of right lower leg with fat layer exposed: Secondary | ICD-10-CM | POA: Diagnosis not present

## 2020-11-10 DIAGNOSIS — L97822 Non-pressure chronic ulcer of other part of left lower leg with fat layer exposed: Secondary | ICD-10-CM | POA: Diagnosis not present

## 2020-11-10 DIAGNOSIS — I129 Hypertensive chronic kidney disease with stage 1 through stage 4 chronic kidney disease, or unspecified chronic kidney disease: Secondary | ICD-10-CM | POA: Diagnosis not present

## 2020-11-10 DIAGNOSIS — S81811A Laceration without foreign body, right lower leg, initial encounter: Secondary | ICD-10-CM | POA: Diagnosis not present

## 2020-11-10 DIAGNOSIS — E1142 Type 2 diabetes mellitus with diabetic polyneuropathy: Secondary | ICD-10-CM | POA: Diagnosis not present

## 2020-11-13 DIAGNOSIS — E2740 Unspecified adrenocortical insufficiency: Secondary | ICD-10-CM | POA: Diagnosis not present

## 2020-11-13 DIAGNOSIS — E1142 Type 2 diabetes mellitus with diabetic polyneuropathy: Secondary | ICD-10-CM | POA: Diagnosis not present

## 2020-11-13 DIAGNOSIS — I872 Venous insufficiency (chronic) (peripheral): Secondary | ICD-10-CM | POA: Diagnosis not present

## 2020-11-13 DIAGNOSIS — L97812 Non-pressure chronic ulcer of other part of right lower leg with fat layer exposed: Secondary | ICD-10-CM | POA: Diagnosis not present

## 2020-11-13 DIAGNOSIS — E1122 Type 2 diabetes mellitus with diabetic chronic kidney disease: Secondary | ICD-10-CM | POA: Diagnosis not present

## 2020-11-13 DIAGNOSIS — I129 Hypertensive chronic kidney disease with stage 1 through stage 4 chronic kidney disease, or unspecified chronic kidney disease: Secondary | ICD-10-CM | POA: Diagnosis not present

## 2020-11-13 DIAGNOSIS — N1831 Chronic kidney disease, stage 3a: Secondary | ICD-10-CM | POA: Diagnosis not present

## 2020-11-13 DIAGNOSIS — M1710 Unilateral primary osteoarthritis, unspecified knee: Secondary | ICD-10-CM | POA: Diagnosis not present

## 2020-11-13 DIAGNOSIS — L97822 Non-pressure chronic ulcer of other part of left lower leg with fat layer exposed: Secondary | ICD-10-CM | POA: Diagnosis not present

## 2020-11-13 DIAGNOSIS — E1143 Type 2 diabetes mellitus with diabetic autonomic (poly)neuropathy: Secondary | ICD-10-CM | POA: Diagnosis not present

## 2020-11-14 ENCOUNTER — Other Ambulatory Visit (HOSPITAL_COMMUNITY): Payer: Self-pay | Admitting: Internal Medicine

## 2020-11-14 ENCOUNTER — Ambulatory Visit (HOSPITAL_COMMUNITY)
Admission: RE | Admit: 2020-11-14 | Discharge: 2020-11-14 | Disposition: A | Discharge status: Home / Self Care | Payer: Medicare HMO | Source: Ambulatory Visit | Attending: Internal Medicine | Admitting: Internal Medicine

## 2020-11-14 ENCOUNTER — Other Ambulatory Visit: Payer: Self-pay

## 2020-11-14 ENCOUNTER — Ambulatory Visit (INDEPENDENT_AMBULATORY_CARE_PROVIDER_SITE_OTHER)
Admission: RE | Admit: 2020-11-14 | Discharge: 2020-11-14 | Disposition: A | Discharge status: Home / Self Care | Payer: Medicare HMO | Source: Ambulatory Visit | Attending: Internal Medicine | Admitting: Internal Medicine

## 2020-11-14 DIAGNOSIS — E1143 Type 2 diabetes mellitus with diabetic autonomic (poly)neuropathy: Secondary | ICD-10-CM | POA: Diagnosis not present

## 2020-11-14 DIAGNOSIS — M79604 Pain in right leg: Secondary | ICD-10-CM | POA: Diagnosis not present

## 2020-11-14 DIAGNOSIS — M1990 Unspecified osteoarthritis, unspecified site: Secondary | ICD-10-CM | POA: Diagnosis not present

## 2020-11-14 DIAGNOSIS — G8929 Other chronic pain: Secondary | ICD-10-CM | POA: Diagnosis not present

## 2020-11-14 DIAGNOSIS — N1831 Chronic kidney disease, stage 3a: Secondary | ICD-10-CM | POA: Diagnosis not present

## 2020-11-14 DIAGNOSIS — H409 Unspecified glaucoma: Secondary | ICD-10-CM | POA: Diagnosis not present

## 2020-11-14 DIAGNOSIS — K219 Gastro-esophageal reflux disease without esophagitis: Secondary | ICD-10-CM | POA: Diagnosis not present

## 2020-11-14 DIAGNOSIS — L97822 Non-pressure chronic ulcer of other part of left lower leg with fat layer exposed: Secondary | ICD-10-CM | POA: Diagnosis not present

## 2020-11-14 DIAGNOSIS — M79605 Pain in left leg: Secondary | ICD-10-CM

## 2020-11-14 DIAGNOSIS — L97812 Non-pressure chronic ulcer of other part of right lower leg with fat layer exposed: Secondary | ICD-10-CM | POA: Diagnosis not present

## 2020-11-14 DIAGNOSIS — E2740 Unspecified adrenocortical insufficiency: Secondary | ICD-10-CM | POA: Diagnosis not present

## 2020-11-14 DIAGNOSIS — M8580 Other specified disorders of bone density and structure, unspecified site: Secondary | ICD-10-CM | POA: Diagnosis not present

## 2020-11-14 DIAGNOSIS — M1710 Unilateral primary osteoarthritis, unspecified knee: Secondary | ICD-10-CM | POA: Diagnosis not present

## 2020-11-14 DIAGNOSIS — I129 Hypertensive chronic kidney disease with stage 1 through stage 4 chronic kidney disease, or unspecified chronic kidney disease: Secondary | ICD-10-CM | POA: Diagnosis not present

## 2020-11-14 DIAGNOSIS — M810 Age-related osteoporosis without current pathological fracture: Secondary | ICD-10-CM | POA: Diagnosis not present

## 2020-11-14 DIAGNOSIS — I872 Venous insufficiency (chronic) (peripheral): Secondary | ICD-10-CM | POA: Diagnosis not present

## 2020-11-14 DIAGNOSIS — E782 Mixed hyperlipidemia: Secondary | ICD-10-CM | POA: Diagnosis not present

## 2020-11-14 DIAGNOSIS — I10 Essential (primary) hypertension: Secondary | ICD-10-CM | POA: Diagnosis not present

## 2020-11-14 DIAGNOSIS — E1122 Type 2 diabetes mellitus with diabetic chronic kidney disease: Secondary | ICD-10-CM | POA: Diagnosis not present

## 2020-11-14 DIAGNOSIS — E1142 Type 2 diabetes mellitus with diabetic polyneuropathy: Secondary | ICD-10-CM | POA: Diagnosis not present

## 2020-11-15 DIAGNOSIS — N1831 Chronic kidney disease, stage 3a: Secondary | ICD-10-CM | POA: Diagnosis not present

## 2020-11-15 DIAGNOSIS — E1142 Type 2 diabetes mellitus with diabetic polyneuropathy: Secondary | ICD-10-CM | POA: Diagnosis not present

## 2020-11-15 DIAGNOSIS — E1143 Type 2 diabetes mellitus with diabetic autonomic (poly)neuropathy: Secondary | ICD-10-CM | POA: Diagnosis not present

## 2020-11-15 DIAGNOSIS — M1710 Unilateral primary osteoarthritis, unspecified knee: Secondary | ICD-10-CM | POA: Diagnosis not present

## 2020-11-15 DIAGNOSIS — L97812 Non-pressure chronic ulcer of other part of right lower leg with fat layer exposed: Secondary | ICD-10-CM | POA: Diagnosis not present

## 2020-11-15 DIAGNOSIS — E2740 Unspecified adrenocortical insufficiency: Secondary | ICD-10-CM | POA: Diagnosis not present

## 2020-11-15 DIAGNOSIS — L97822 Non-pressure chronic ulcer of other part of left lower leg with fat layer exposed: Secondary | ICD-10-CM | POA: Diagnosis not present

## 2020-11-15 DIAGNOSIS — I129 Hypertensive chronic kidney disease with stage 1 through stage 4 chronic kidney disease, or unspecified chronic kidney disease: Secondary | ICD-10-CM | POA: Diagnosis not present

## 2020-11-15 DIAGNOSIS — I872 Venous insufficiency (chronic) (peripheral): Secondary | ICD-10-CM | POA: Diagnosis not present

## 2020-11-15 DIAGNOSIS — E1122 Type 2 diabetes mellitus with diabetic chronic kidney disease: Secondary | ICD-10-CM | POA: Diagnosis not present

## 2020-11-17 DIAGNOSIS — L97812 Non-pressure chronic ulcer of other part of right lower leg with fat layer exposed: Secondary | ICD-10-CM | POA: Diagnosis not present

## 2020-11-17 DIAGNOSIS — E1143 Type 2 diabetes mellitus with diabetic autonomic (poly)neuropathy: Secondary | ICD-10-CM | POA: Diagnosis not present

## 2020-11-17 DIAGNOSIS — I129 Hypertensive chronic kidney disease with stage 1 through stage 4 chronic kidney disease, or unspecified chronic kidney disease: Secondary | ICD-10-CM | POA: Diagnosis not present

## 2020-11-17 DIAGNOSIS — E2740 Unspecified adrenocortical insufficiency: Secondary | ICD-10-CM | POA: Diagnosis not present

## 2020-11-17 DIAGNOSIS — I872 Venous insufficiency (chronic) (peripheral): Secondary | ICD-10-CM | POA: Diagnosis not present

## 2020-11-17 DIAGNOSIS — L97822 Non-pressure chronic ulcer of other part of left lower leg with fat layer exposed: Secondary | ICD-10-CM | POA: Diagnosis not present

## 2020-11-17 DIAGNOSIS — E1122 Type 2 diabetes mellitus with diabetic chronic kidney disease: Secondary | ICD-10-CM | POA: Diagnosis not present

## 2020-11-17 DIAGNOSIS — M1710 Unilateral primary osteoarthritis, unspecified knee: Secondary | ICD-10-CM | POA: Diagnosis not present

## 2020-11-17 DIAGNOSIS — E1142 Type 2 diabetes mellitus with diabetic polyneuropathy: Secondary | ICD-10-CM | POA: Diagnosis not present

## 2020-11-17 DIAGNOSIS — N1831 Chronic kidney disease, stage 3a: Secondary | ICD-10-CM | POA: Diagnosis not present

## 2020-11-20 DIAGNOSIS — I129 Hypertensive chronic kidney disease with stage 1 through stage 4 chronic kidney disease, or unspecified chronic kidney disease: Secondary | ICD-10-CM | POA: Diagnosis not present

## 2020-11-20 DIAGNOSIS — M1710 Unilateral primary osteoarthritis, unspecified knee: Secondary | ICD-10-CM | POA: Diagnosis not present

## 2020-11-20 DIAGNOSIS — E1122 Type 2 diabetes mellitus with diabetic chronic kidney disease: Secondary | ICD-10-CM | POA: Diagnosis not present

## 2020-11-20 DIAGNOSIS — E1142 Type 2 diabetes mellitus with diabetic polyneuropathy: Secondary | ICD-10-CM | POA: Diagnosis not present

## 2020-11-20 DIAGNOSIS — L97822 Non-pressure chronic ulcer of other part of left lower leg with fat layer exposed: Secondary | ICD-10-CM | POA: Diagnosis not present

## 2020-11-20 DIAGNOSIS — I872 Venous insufficiency (chronic) (peripheral): Secondary | ICD-10-CM | POA: Diagnosis not present

## 2020-11-20 DIAGNOSIS — E2740 Unspecified adrenocortical insufficiency: Secondary | ICD-10-CM | POA: Diagnosis not present

## 2020-11-20 DIAGNOSIS — E1143 Type 2 diabetes mellitus with diabetic autonomic (poly)neuropathy: Secondary | ICD-10-CM | POA: Diagnosis not present

## 2020-11-20 DIAGNOSIS — N1831 Chronic kidney disease, stage 3a: Secondary | ICD-10-CM | POA: Diagnosis not present

## 2020-11-20 DIAGNOSIS — L97812 Non-pressure chronic ulcer of other part of right lower leg with fat layer exposed: Secondary | ICD-10-CM | POA: Diagnosis not present

## 2020-11-22 DIAGNOSIS — L97812 Non-pressure chronic ulcer of other part of right lower leg with fat layer exposed: Secondary | ICD-10-CM | POA: Diagnosis not present

## 2020-11-22 DIAGNOSIS — M1710 Unilateral primary osteoarthritis, unspecified knee: Secondary | ICD-10-CM | POA: Diagnosis not present

## 2020-11-22 DIAGNOSIS — E2740 Unspecified adrenocortical insufficiency: Secondary | ICD-10-CM | POA: Diagnosis not present

## 2020-11-22 DIAGNOSIS — E1143 Type 2 diabetes mellitus with diabetic autonomic (poly)neuropathy: Secondary | ICD-10-CM | POA: Diagnosis not present

## 2020-11-22 DIAGNOSIS — L97822 Non-pressure chronic ulcer of other part of left lower leg with fat layer exposed: Secondary | ICD-10-CM | POA: Diagnosis not present

## 2020-11-22 DIAGNOSIS — E1122 Type 2 diabetes mellitus with diabetic chronic kidney disease: Secondary | ICD-10-CM | POA: Diagnosis not present

## 2020-11-22 DIAGNOSIS — I872 Venous insufficiency (chronic) (peripheral): Secondary | ICD-10-CM | POA: Diagnosis not present

## 2020-11-22 DIAGNOSIS — N1831 Chronic kidney disease, stage 3a: Secondary | ICD-10-CM | POA: Diagnosis not present

## 2020-11-22 DIAGNOSIS — I129 Hypertensive chronic kidney disease with stage 1 through stage 4 chronic kidney disease, or unspecified chronic kidney disease: Secondary | ICD-10-CM | POA: Diagnosis not present

## 2020-11-22 DIAGNOSIS — E1142 Type 2 diabetes mellitus with diabetic polyneuropathy: Secondary | ICD-10-CM | POA: Diagnosis not present

## 2020-11-24 DIAGNOSIS — L97812 Non-pressure chronic ulcer of other part of right lower leg with fat layer exposed: Secondary | ICD-10-CM | POA: Diagnosis not present

## 2020-11-24 DIAGNOSIS — M1710 Unilateral primary osteoarthritis, unspecified knee: Secondary | ICD-10-CM | POA: Diagnosis not present

## 2020-11-24 DIAGNOSIS — E2740 Unspecified adrenocortical insufficiency: Secondary | ICD-10-CM | POA: Diagnosis not present

## 2020-11-24 DIAGNOSIS — E1142 Type 2 diabetes mellitus with diabetic polyneuropathy: Secondary | ICD-10-CM | POA: Diagnosis not present

## 2020-11-24 DIAGNOSIS — E1122 Type 2 diabetes mellitus with diabetic chronic kidney disease: Secondary | ICD-10-CM | POA: Diagnosis not present

## 2020-11-24 DIAGNOSIS — L97822 Non-pressure chronic ulcer of other part of left lower leg with fat layer exposed: Secondary | ICD-10-CM | POA: Diagnosis not present

## 2020-11-24 DIAGNOSIS — E1143 Type 2 diabetes mellitus with diabetic autonomic (poly)neuropathy: Secondary | ICD-10-CM | POA: Diagnosis not present

## 2020-11-24 DIAGNOSIS — I129 Hypertensive chronic kidney disease with stage 1 through stage 4 chronic kidney disease, or unspecified chronic kidney disease: Secondary | ICD-10-CM | POA: Diagnosis not present

## 2020-11-24 DIAGNOSIS — N1831 Chronic kidney disease, stage 3a: Secondary | ICD-10-CM | POA: Diagnosis not present

## 2020-11-24 DIAGNOSIS — I872 Venous insufficiency (chronic) (peripheral): Secondary | ICD-10-CM | POA: Diagnosis not present

## 2020-11-27 DIAGNOSIS — E2740 Unspecified adrenocortical insufficiency: Secondary | ICD-10-CM | POA: Diagnosis not present

## 2020-11-27 DIAGNOSIS — I129 Hypertensive chronic kidney disease with stage 1 through stage 4 chronic kidney disease, or unspecified chronic kidney disease: Secondary | ICD-10-CM | POA: Diagnosis not present

## 2020-11-27 DIAGNOSIS — N1831 Chronic kidney disease, stage 3a: Secondary | ICD-10-CM | POA: Diagnosis not present

## 2020-11-27 DIAGNOSIS — S81812A Laceration without foreign body, left lower leg, initial encounter: Secondary | ICD-10-CM | POA: Diagnosis not present

## 2020-11-27 DIAGNOSIS — M1710 Unilateral primary osteoarthritis, unspecified knee: Secondary | ICD-10-CM | POA: Diagnosis not present

## 2020-11-27 DIAGNOSIS — E1122 Type 2 diabetes mellitus with diabetic chronic kidney disease: Secondary | ICD-10-CM | POA: Diagnosis not present

## 2020-11-27 DIAGNOSIS — S81811A Laceration without foreign body, right lower leg, initial encounter: Secondary | ICD-10-CM | POA: Diagnosis not present

## 2020-11-27 DIAGNOSIS — E1142 Type 2 diabetes mellitus with diabetic polyneuropathy: Secondary | ICD-10-CM | POA: Diagnosis not present

## 2020-11-27 DIAGNOSIS — L97822 Non-pressure chronic ulcer of other part of left lower leg with fat layer exposed: Secondary | ICD-10-CM | POA: Diagnosis not present

## 2020-11-27 DIAGNOSIS — L97812 Non-pressure chronic ulcer of other part of right lower leg with fat layer exposed: Secondary | ICD-10-CM | POA: Diagnosis not present

## 2020-11-27 DIAGNOSIS — I872 Venous insufficiency (chronic) (peripheral): Secondary | ICD-10-CM | POA: Diagnosis not present

## 2020-11-27 DIAGNOSIS — E1143 Type 2 diabetes mellitus with diabetic autonomic (poly)neuropathy: Secondary | ICD-10-CM | POA: Diagnosis not present

## 2020-11-28 DIAGNOSIS — N1831 Chronic kidney disease, stage 3a: Secondary | ICD-10-CM | POA: Diagnosis not present

## 2020-11-28 DIAGNOSIS — I129 Hypertensive chronic kidney disease with stage 1 through stage 4 chronic kidney disease, or unspecified chronic kidney disease: Secondary | ICD-10-CM | POA: Diagnosis not present

## 2020-11-28 DIAGNOSIS — M1710 Unilateral primary osteoarthritis, unspecified knee: Secondary | ICD-10-CM | POA: Diagnosis not present

## 2020-11-28 DIAGNOSIS — E2740 Unspecified adrenocortical insufficiency: Secondary | ICD-10-CM | POA: Diagnosis not present

## 2020-11-28 DIAGNOSIS — E1122 Type 2 diabetes mellitus with diabetic chronic kidney disease: Secondary | ICD-10-CM | POA: Diagnosis not present

## 2020-11-28 DIAGNOSIS — L97812 Non-pressure chronic ulcer of other part of right lower leg with fat layer exposed: Secondary | ICD-10-CM | POA: Diagnosis not present

## 2020-11-28 DIAGNOSIS — L97822 Non-pressure chronic ulcer of other part of left lower leg with fat layer exposed: Secondary | ICD-10-CM | POA: Diagnosis not present

## 2020-11-28 DIAGNOSIS — E1143 Type 2 diabetes mellitus with diabetic autonomic (poly)neuropathy: Secondary | ICD-10-CM | POA: Diagnosis not present

## 2020-11-28 DIAGNOSIS — E1142 Type 2 diabetes mellitus with diabetic polyneuropathy: Secondary | ICD-10-CM | POA: Diagnosis not present

## 2020-11-28 DIAGNOSIS — I872 Venous insufficiency (chronic) (peripheral): Secondary | ICD-10-CM | POA: Diagnosis not present

## 2020-11-29 DIAGNOSIS — E1142 Type 2 diabetes mellitus with diabetic polyneuropathy: Secondary | ICD-10-CM | POA: Diagnosis not present

## 2020-11-29 DIAGNOSIS — E1143 Type 2 diabetes mellitus with diabetic autonomic (poly)neuropathy: Secondary | ICD-10-CM | POA: Diagnosis not present

## 2020-11-29 DIAGNOSIS — I872 Venous insufficiency (chronic) (peripheral): Secondary | ICD-10-CM | POA: Diagnosis not present

## 2020-11-29 DIAGNOSIS — L97822 Non-pressure chronic ulcer of other part of left lower leg with fat layer exposed: Secondary | ICD-10-CM | POA: Diagnosis not present

## 2020-11-29 DIAGNOSIS — I10 Essential (primary) hypertension: Secondary | ICD-10-CM | POA: Diagnosis not present

## 2020-11-29 DIAGNOSIS — M8580 Other specified disorders of bone density and structure, unspecified site: Secondary | ICD-10-CM | POA: Diagnosis not present

## 2020-11-29 DIAGNOSIS — E2740 Unspecified adrenocortical insufficiency: Secondary | ICD-10-CM | POA: Diagnosis not present

## 2020-11-29 DIAGNOSIS — E1122 Type 2 diabetes mellitus with diabetic chronic kidney disease: Secondary | ICD-10-CM | POA: Diagnosis not present

## 2020-11-29 DIAGNOSIS — M1710 Unilateral primary osteoarthritis, unspecified knee: Secondary | ICD-10-CM | POA: Diagnosis not present

## 2020-11-29 DIAGNOSIS — K219 Gastro-esophageal reflux disease without esophagitis: Secondary | ICD-10-CM | POA: Diagnosis not present

## 2020-11-29 DIAGNOSIS — M810 Age-related osteoporosis without current pathological fracture: Secondary | ICD-10-CM | POA: Diagnosis not present

## 2020-11-29 DIAGNOSIS — H409 Unspecified glaucoma: Secondary | ICD-10-CM | POA: Diagnosis not present

## 2020-11-29 DIAGNOSIS — M1990 Unspecified osteoarthritis, unspecified site: Secondary | ICD-10-CM | POA: Diagnosis not present

## 2020-11-29 DIAGNOSIS — I129 Hypertensive chronic kidney disease with stage 1 through stage 4 chronic kidney disease, or unspecified chronic kidney disease: Secondary | ICD-10-CM | POA: Diagnosis not present

## 2020-11-29 DIAGNOSIS — G8929 Other chronic pain: Secondary | ICD-10-CM | POA: Diagnosis not present

## 2020-11-29 DIAGNOSIS — L97812 Non-pressure chronic ulcer of other part of right lower leg with fat layer exposed: Secondary | ICD-10-CM | POA: Diagnosis not present

## 2020-11-29 DIAGNOSIS — E782 Mixed hyperlipidemia: Secondary | ICD-10-CM | POA: Diagnosis not present

## 2020-11-29 DIAGNOSIS — N1831 Chronic kidney disease, stage 3a: Secondary | ICD-10-CM | POA: Diagnosis not present

## 2020-12-01 DIAGNOSIS — I872 Venous insufficiency (chronic) (peripheral): Secondary | ICD-10-CM | POA: Diagnosis not present

## 2020-12-01 DIAGNOSIS — E1122 Type 2 diabetes mellitus with diabetic chronic kidney disease: Secondary | ICD-10-CM | POA: Diagnosis not present

## 2020-12-01 DIAGNOSIS — L97812 Non-pressure chronic ulcer of other part of right lower leg with fat layer exposed: Secondary | ICD-10-CM | POA: Diagnosis not present

## 2020-12-01 DIAGNOSIS — E2740 Unspecified adrenocortical insufficiency: Secondary | ICD-10-CM | POA: Diagnosis not present

## 2020-12-01 DIAGNOSIS — E1142 Type 2 diabetes mellitus with diabetic polyneuropathy: Secondary | ICD-10-CM | POA: Diagnosis not present

## 2020-12-01 DIAGNOSIS — N1831 Chronic kidney disease, stage 3a: Secondary | ICD-10-CM | POA: Diagnosis not present

## 2020-12-01 DIAGNOSIS — E1143 Type 2 diabetes mellitus with diabetic autonomic (poly)neuropathy: Secondary | ICD-10-CM | POA: Diagnosis not present

## 2020-12-01 DIAGNOSIS — I129 Hypertensive chronic kidney disease with stage 1 through stage 4 chronic kidney disease, or unspecified chronic kidney disease: Secondary | ICD-10-CM | POA: Diagnosis not present

## 2020-12-01 DIAGNOSIS — L97822 Non-pressure chronic ulcer of other part of left lower leg with fat layer exposed: Secondary | ICD-10-CM | POA: Diagnosis not present

## 2020-12-01 DIAGNOSIS — M1710 Unilateral primary osteoarthritis, unspecified knee: Secondary | ICD-10-CM | POA: Diagnosis not present

## 2020-12-04 DIAGNOSIS — E1122 Type 2 diabetes mellitus with diabetic chronic kidney disease: Secondary | ICD-10-CM | POA: Diagnosis not present

## 2020-12-04 DIAGNOSIS — E2740 Unspecified adrenocortical insufficiency: Secondary | ICD-10-CM | POA: Diagnosis not present

## 2020-12-04 DIAGNOSIS — E1142 Type 2 diabetes mellitus with diabetic polyneuropathy: Secondary | ICD-10-CM | POA: Diagnosis not present

## 2020-12-04 DIAGNOSIS — L97812 Non-pressure chronic ulcer of other part of right lower leg with fat layer exposed: Secondary | ICD-10-CM | POA: Diagnosis not present

## 2020-12-04 DIAGNOSIS — E1143 Type 2 diabetes mellitus with diabetic autonomic (poly)neuropathy: Secondary | ICD-10-CM | POA: Diagnosis not present

## 2020-12-04 DIAGNOSIS — L97822 Non-pressure chronic ulcer of other part of left lower leg with fat layer exposed: Secondary | ICD-10-CM | POA: Diagnosis not present

## 2020-12-04 DIAGNOSIS — I129 Hypertensive chronic kidney disease with stage 1 through stage 4 chronic kidney disease, or unspecified chronic kidney disease: Secondary | ICD-10-CM | POA: Diagnosis not present

## 2020-12-04 DIAGNOSIS — N1831 Chronic kidney disease, stage 3a: Secondary | ICD-10-CM | POA: Diagnosis not present

## 2020-12-04 DIAGNOSIS — M1710 Unilateral primary osteoarthritis, unspecified knee: Secondary | ICD-10-CM | POA: Diagnosis not present

## 2020-12-04 DIAGNOSIS — I872 Venous insufficiency (chronic) (peripheral): Secondary | ICD-10-CM | POA: Diagnosis not present

## 2020-12-05 ENCOUNTER — Other Ambulatory Visit: Payer: Self-pay

## 2020-12-05 ENCOUNTER — Encounter (HOSPITAL_BASED_OUTPATIENT_CLINIC_OR_DEPARTMENT_OTHER): Payer: Medicare HMO | Attending: Internal Medicine | Admitting: Internal Medicine

## 2020-12-05 DIAGNOSIS — L97418 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: Secondary | ICD-10-CM | POA: Insufficient documentation

## 2020-12-05 DIAGNOSIS — L97421 Non-pressure chronic ulcer of left heel and midfoot limited to breakdown of skin: Secondary | ICD-10-CM | POA: Diagnosis present

## 2020-12-05 DIAGNOSIS — I872 Venous insufficiency (chronic) (peripheral): Secondary | ICD-10-CM | POA: Diagnosis not present

## 2020-12-05 DIAGNOSIS — Z92241 Personal history of systemic steroid therapy: Secondary | ICD-10-CM | POA: Diagnosis not present

## 2020-12-05 DIAGNOSIS — E11621 Type 2 diabetes mellitus with foot ulcer: Secondary | ICD-10-CM | POA: Diagnosis not present

## 2020-12-05 DIAGNOSIS — L97428 Non-pressure chronic ulcer of left heel and midfoot with other specified severity: Secondary | ICD-10-CM | POA: Insufficient documentation

## 2020-12-05 DIAGNOSIS — S81812A Laceration without foreign body, left lower leg, initial encounter: Secondary | ICD-10-CM | POA: Diagnosis not present

## 2020-12-05 DIAGNOSIS — E11622 Type 2 diabetes mellitus with other skin ulcer: Secondary | ICD-10-CM | POA: Insufficient documentation

## 2020-12-05 DIAGNOSIS — S81811A Laceration without foreign body, right lower leg, initial encounter: Secondary | ICD-10-CM | POA: Diagnosis not present

## 2020-12-05 DIAGNOSIS — L97821 Non-pressure chronic ulcer of other part of left lower leg limited to breakdown of skin: Secondary | ICD-10-CM | POA: Insufficient documentation

## 2020-12-05 DIAGNOSIS — L97819 Non-pressure chronic ulcer of other part of right lower leg with unspecified severity: Secondary | ICD-10-CM | POA: Diagnosis not present

## 2020-12-05 NOTE — Progress Notes (Signed)
Sonya Goodwin, [place] ([id number]) Visit Report for 12/05/2020 HPI Details Patient Name: Date of Service: Sonya Goodwin, [name] 12/05/2020 12:30 PM Medical Record Number: [id number] Patient Account Number: [id number] Date of Birth/Sex: Treating RN: February 07, 1941 (79 y.o. [name] Primary Care Provider: Nicholes Stairs Other Clinician: Referring Provider: Treating Provider/Extender: Nyra Jabs in Treatment: 14 History of Present Illness HPI Description: We think they have been using silver alginateADMISSION 08/29/2020 This is a 79 year old woman who has wounds on her left lower leg mid aspect. She states that these have been present since June when she was hospitalized with a UTI, delirium possibly medication issues. I do not see any mention of the wounds on her left leg at that time. In any case these develop sometime after this. The patient is followed by Dr. Jacelyn Grip at [place] at [facility]. She has been using bacitracin ointment to the wounds. She has [facility] home health although I am not exactly sure what they are doing. Her husband is angry about a bill from home health again we were not able to help him exactly. The patient has chronic sarcoidosis and adrenal insufficiency she has been on longstanding prednisone. She has all the classic skin features of chronic steroid use. I think this is the primary issue for these wounds. They have also noted weeping fluid coming out of the wounds and even weeping draining fluid on the right leg although we were not able to define any wound on the right leg. She could very well have a component of chronic venous insufficiency as well. Past medical history includes sarcoidosis, type 2 diabetes with a recent hemoglobin A1c of 6.3, diabetic neuropathy, stage III P chronic renal failure, hypertension, adrenal insufficiency. ABI on the right noncompressible on the left at 1.02 9/14 2 small punched out areas of the left anterior  mid tibia. We have been using Iodoflex. Better looking wound surfaces 9/28; the 2 areas on the left anterior mid tibia are about the same. She has a new weeping area on the right side just medial to the tibia. I think all of these wounds are weeping edema fluid from a collection of fluid in the upper third of her lower leg. The skin distally is more fibrosed and there is no room for weeping edema fluid. I wanted to put her in 3 layer compression versus kerlix and Coban but she will not agree to it 10/12; 2-week follow-up. She has the 2 areas on the left anterior mid tibia. There is scissor injury now on the left medial calf. In a single area on the right medial lower extremity. All of these are roughly the same small punched out surfaces. Weeping edema fluid. She does not tolerate anything more than kerlix Coban and even with this she finds this too tight often making her husband cut these off. I had ordered Iodoflex to these wounds but according to her husband that is not what home health is using. 11/9; patient missed her last appointment so she has not been seen here in almost a month. She has the 2 punched-out areas on the left mid tibia 1 medial and 1 lateral a small excoriation a little more superior and lateral also on the left but she has also punched out areas on the right that she did not have last time I am not exactly sure how this happened. She complains about pain in her feet at night especially in the morning. Not really claudication but I was  I was concerned. Her edema is satisfactorily concerned and the kerlix and Coban 12/7; again a monthly follow-up. I am not exactly sure how we got into this monthly pattern. She does have home health coming out 3 times a week. She did manage to get the arterial studies done that I ordered last time. On the right her ABI was noncompressible although she had triphasic waveforms at the dorsalis pedis her great toe pressure was unobtainable. On the  left she had triphasic waveforms at the PTA and dorsalis pedis but again absent great toe waveforms. Segmental waveform analysis showed suggestion of posterior tibial artery occlusion on the right but a biphasic triphasic waveform with a normal normal examination on the left. The patient describes a lot of pain it is not clearly claudication but certainly there is a possibility here. She also has what I think is longstanding Raynaud's dating back to when she was a teenager She has new wounds on the left Achilles heel on the right Achilles heel from last time otherwise her wounds are about the same Electronic Signature(s) Signed: 12/05/2020 5:06:48 PM By: Linton Ham MD Entered By: Linton Ham on 12/05/2020 13:35:59 -------------------------------------------------------------------------------- Physical Exam Details Patient Name: Date of Service: Sonya Goodwin. 12/05/2020 12:30 PM Medical Record Number: [id number] Patient Account Number: [id number] Date of Birth/Sex: Treating RN: 09/09/1941 (79 y.o. [name] Primary Care Provider: Nicholes Stairs Other Clinician: Referring Provider: Treating Provider/Extender: Nyra Jabs in Treatment: 14 Constitutional Patient is hypertensive.. Pulse regular and within target range for patient.Marland Kitchen Respirations regular, non-labored and within target range.. Temperature is normal and within the target range for the patient.Marland Kitchen Appears in no distress. Respiratory work of breathing is normal. Cardiovascular Popliteal pulses are palpable. Dorsalis pedis pulses are palpable bilaterally however I cannot feel posterior tibial pulses on either side. Notes Wound exam Left mid tibia has small punched out areas medially and laterally. These are reasonably unchanged. On the right she has an area on the medial side She has new areas x2 on the left Achilles heel and of 1 on the right. These are small punched out areas. Her  feet are cool cyanotic at times. Electronic Signature(s) Signed: 12/05/2020 5:06:48 PM By: Linton Ham MD Entered By: Linton Ham on 12/05/2020 13:37:57 -------------------------------------------------------------------------------- Physician Orders Details Patient Name: Date of Service: Sonya Goodwin. 12/05/2020 12:30 PM Medical Record Number: [id number] Patient Account Number: [id number] Date of Birth/Sex: Treating RN: 25-Jul-1941 (79 y.o. [name] Primary Care Provider: Nicholes Stairs Other Clinician: Referring Provider: Treating Provider/Extender: Nyra Jabs in Treatment: 30 Verbal / Phone Orders: No Diagnosis Coding ICD-10 Coding Code Description [phone] Non-pressure chronic ulcer of other part of left lower leg limited to breakdown of skin I87.2 Venous insufficiency (chronic) (peripheral) Z92.241 Personal history of systemic steroid therapy L97.819 Non-pressure chronic ulcer of other part of right lower leg with unspecified severity Follow-up Appointments Return appointment in 1 month. Bathing/ Shower/ Hygiene May shower with protection but do not get wound dressing(s) wet. Edema Control - Lymphedema / SCD / Other Elevate legs to the level of the heart or above for 30 minutes daily and/or when sitting, a frequency of: Avoid standing for long periods of time. Exercise regularly [place] wound care orders this week; continue Home Health for wound care. May utilize formulary equivalent dressing for wound treatment orders unless otherwise specified. [facility] . 2 times per week Wound Treatment Wound #1 - Lower Leg  Wound Laterality: Left, Anterior Cleanser: Normal Saline (Home Health) (Generic) 2 x Per Day/30 Days Discharge Instructions: Cleanse the wound with Normal Saline prior to applying a clean dressing using gauze sponges, not tissue or cotton balls. Prim Dressing: Promogran Prisma Matrix, 4.34 (sq in) (silver  collagen) (Home Health) (Generic) 2 x Per Day/30 Days ary Discharge Instructions: moisten with normal saline Secondary Dressing: Woven Gauze Sponge, Non-Sterile 4x4 in (Home Health) (Generic) 2 x Per Day/30 Days Discharge Instructions: Apply over primary dressing as directed. Secondary Dressing: ABD Pad, 5x9 (Home Health) 2 x Per Day/30 Days Discharge Instructions: Apply over primary dressing as directed. Compression Wrap: Kerlix Roll 4.5x3.1 (in/yd) (Home Health) (Generic) 2 x Per Day/30 Days Discharge Instructions: Apply Kerlix and Coban compression as directed lightly . from base of toes to patella knotch Compression Wrap: Coban Self-Adherent Wrap 4x5 (in/yd) (Home Health) (Generic) 2 x Per Day/30 Days Discharge Instructions: Apply over Kerlix as directed.from base of toes to patella knotch Wound #2 - Lower Leg Wound Laterality: Left, Lateral Cleanser: Normal Saline (Home Health) (Generic) 2 x Per Day/30 Days Discharge Instructions: Cleanse the wound with Normal Saline prior to applying a clean dressing using gauze sponges, not tissue or cotton balls. Prim Dressing: Promogran Prisma Matrix, 4.34 (sq in) (silver collagen) (Home Health) (Generic) 2 x Per Day/30 Days ary Discharge Instructions: moisten with normal saline Secondary Dressing: Woven Gauze Sponge, Non-Sterile 4x4 in (Home Health) (Generic) 2 x Per Day/30 Days Discharge Instructions: Apply over primary dressing as directed. Secondary Dressing: ABD Pad, 5x9 (Home Health) 2 x Per Day/30 Days Discharge Instructions: Apply over primary dressing as directed. Compression Wrap: Kerlix Roll 4.5x3.1 (in/yd) (Home Health) (Generic) 2 x Per Day/30 Days Discharge Instructions: Apply Kerlix and Coban compression as directed.from base of toes to patella knotch Compression Wrap: Coban Self-Adherent Wrap 4x5 (in/yd) (Home Health) (Generic) 2 x Per Day/30 Days Discharge Instructions: Apply over Kerlix as directed.from base of toes to patella  knotch Wound #3 - Lower Leg Wound Laterality: Right, Anterior Cleanser: Normal Saline (Home Health) (Generic) 2 x Per Day/30 Days Discharge Instructions: Cleanse the wound with Normal Saline prior to applying a clean dressing using gauze sponges, not tissue or cotton balls. Prim Dressing: Promogran Prisma Matrix, 4.34 (sq in) (silver collagen) (Home Health) (Generic) 2 x Per Day/30 Days ary Discharge Instructions: moisten with normal saline Secondary Dressing: Woven Gauze Sponge, Non-Sterile 4x4 in (Home Health) (Generic) 2 x Per Day/30 Days Discharge Instructions: Apply over primary dressing as directed. Secondary Dressing: ABD Pad, 5x9 (Home Health) 2 x Per Day/30 Days Discharge Instructions: Apply over primary dressing as directed. Compression Wrap: Kerlix Roll 4.5x3.1 (in/yd) (Home Health) (Generic) 2 x Per Day/30 Days Discharge Instructions: Apply Kerlix and Coban compression as directed.from base of toes to patella knotch Compression Wrap: Coban Self-Adherent Wrap 4x5 (in/yd) (Home Health) (Generic) 2 x Per Day/30 Days Discharge Instructions: Apply over Kerlix as directed.from base of toes to patella knotch Wound #5 - Lower Leg Wound Laterality: Right, Lateral Cleanser: Normal Saline (Home Health) (Generic) 2 x Per Day/30 Days Discharge Instructions: Cleanse the wound with Normal Saline prior to applying a clean dressing using gauze sponges, not tissue or cotton balls. Prim Dressing: Promogran Prisma Matrix, 4.34 (sq in) (silver collagen) (Home Health) (Generic) 2 x Per Day/30 Days ary Discharge Instructions: moisten with normal saline Secondary Dressing: Woven Gauze Sponge, Non-Sterile 4x4 in (Home Health) (Generic) 2 x Per Day/30 Days Discharge Instructions: Apply over primary dressing as directed. Secondary Dressing: ABD Pad, 5x9 (  Home Health) 2 x Per Day/30 Days Discharge Instructions: Apply over primary dressing as directed. Compression Wrap: Kerlix Roll 4.5x3.1 (in/yd) (Home  Health) (Generic) 2 x Per Day/30 Days Discharge Instructions: Apply Kerlix and Coban compression as directed. from base of toes to patella knotch Compression Wrap: Coban Self-Adherent Wrap 4x5 (in/yd) (Home Health) (Generic) 2 x Per Day/30 Days Discharge Instructions: Apply over Kerlix as directed. from base of toes to patella knotch Wound #6 - Lower Leg Wound Laterality: Left, Medial Cleanser: Normal Saline (Home Health) (Generic) 2 x Per Day/30 Days Discharge Instructions: Cleanse the wound with Normal Saline prior to applying a clean dressing using gauze sponges, not tissue or cotton balls. Prim Dressing: Promogran Prisma Matrix, 4.34 (sq in) (silver collagen) (Home Health) (Generic) 2 x Per Day/30 Days ary Discharge Instructions: moisten with normal saline Secondary Dressing: Woven Gauze Sponge, Non-Sterile 4x4 in (Home Health) (Generic) 2 x Per Day/30 Days Discharge Instructions: Apply over primary dressing as directed. Secondary Dressing: ABD Pad, 5x9 (Home Health) 2 x Per Day/30 Days Discharge Instructions: Apply over primary dressing as directed. Compression Wrap: Kerlix Roll 4.5x3.1 (in/yd) (Home Health) (Generic) 2 x Per Day/30 Days Discharge Instructions: Apply Kerlix and Coban compression as directed.from base of toes to patella knotch Compression Wrap: Coban Self-Adherent Wrap 4x5 (in/yd) (Home Health) (Generic) 2 x Per Day/30 Days Discharge Instructions: Apply over Kerlix as directed.from base of toes to patella knotch Wound #7 - Calcaneus Wound Laterality: Right, Proximal Cleanser: Normal Saline (Home Health) (Generic) 2 x Per Day/30 Days Discharge Instructions: Cleanse the wound with Normal Saline prior to applying a clean dressing using gauze sponges, not tissue or cotton balls. Prim Dressing: Promogran Prisma Matrix, 4.34 (sq in) (silver collagen) (Home Health) (Generic) 2 x Per Day/30 Days ary Discharge Instructions: moisten with normal saline Secondary Dressing: Woven  Gauze Sponge, Non-Sterile 4x4 in (Home Health) (Generic) 2 x Per Day/30 Days Discharge Instructions: Apply over primary dressing as directed. Secondary Dressing: ABD Pad, 5x9 (Home Health) 2 x Per Day/30 Days Discharge Instructions: Apply over primary dressing as directed. Compression Wrap: Kerlix Roll 4.5x3.1 (in/yd) (Home Health) (Generic) 2 x Per Day/30 Days Discharge Instructions: Apply Kerlix and Coban compression as directed. from base of toes to patella knotch Compression Wrap: Coban Self-Adherent Wrap 4x5 (in/yd) (Home Health) (Generic) 2 x Per Day/30 Days Discharge Instructions: Apply over Kerlix as directed.from base of toes to patella knotch Wound #8 - Calcaneus Wound Laterality: Left, Distal Cleanser: Normal Saline (Home Health) (Generic) 2 x Per Day/30 Days Discharge Instructions: Cleanse the wound with Normal Saline prior to applying a clean dressing using gauze sponges, not tissue or cotton balls. Prim Dressing: Promogran Prisma Matrix, 4.34 (sq in) (silver collagen) (Home Health) (Generic) 2 x Per Day/30 Days ary Discharge Instructions: moisten with normal saline Secondary Dressing: Woven Gauze Sponge, Non-Sterile 4x4 in (Home Health) (Generic) 2 x Per Day/30 Days Discharge Instructions: Apply over primary dressing as directed. Secondary Dressing: ABD Pad, 5x9 (Home Health) 2 x Per Day/30 Days Discharge Instructions: Apply over primary dressing as directed. Compression Wrap: Kerlix Roll 4.5x3.1 (in/yd) (Home Health) (Generic) 2 x Per Day/30 Days Discharge Instructions: Apply Kerlix and Coban compression as directed. from base of toes to patella knotch Compression Wrap: Coban Self-Adherent Wrap 4x5 (in/yd) (Home Health) (Generic) 2 x Per Day/30 Days Discharge Instructions: Apply over Kerlix as directed. from base of toes to patella knotch Wound #9 - Calcaneus Wound Laterality: Right Cleanser: Normal Saline (Home Health) (Generic) 2 x Per Day/30 Days Discharge  Instructions:  Cleanse the wound with Normal Saline prior to applying a clean dressing using gauze sponges, not tissue or cotton balls. Prim Dressing: Promogran Prisma Matrix, 4.34 (sq in) (silver collagen) (Home Health) (Generic) 2 x Per Day/30 Days ary Discharge Instructions: moisten with normal saline Secondary Dressing: Woven Gauze Sponge, Non-Sterile 4x4 in (Home Health) (Generic) 2 x Per Day/30 Days Discharge Instructions: Apply over primary dressing as directed. Secondary Dressing: ABD Pad, 5x9 (Home Health) 2 x Per Day/30 Days Discharge Instructions: Apply over primary dressing as directed. Compression Wrap: Kerlix Roll 4.5x3.1 (in/yd) (Home Health) (Generic) 2 x Per Day/30 Days Discharge Instructions: Apply Kerlix and Coban compression as directed.from base of toes to patella knotch Compression Wrap: Coban Self-Adherent Wrap 4x5 (in/yd) (Home Health) (Generic) 2 x Per Day/30 Days Discharge Instructions: Apply over Kerlix as directed. from base of toes to patella knotch Electronic Signature(s) Signed: 12/05/2020 5:06:48 PM By: Linton Ham MD Signed: 12/05/2020 5:28:36 PM By: Carlene Coria RN Entered By: Carlene Coria on 12/05/2020 15:05:29 -------------------------------------------------------------------------------- Problem List Details Patient Name: Date of Service: Sonya Goodwin. 12/05/2020 12:30 PM Medical Record Number: [id number] Patient Account Number: [id number] Date of Birth/Sex: Treating RN: April 22, 1941 (79 y.o. [name] Primary Care Provider: Nicholes Stairs Other Clinician: Referring Provider: Treating Provider/Extender: Nyra Jabs in Treatment: 14 Active Problems ICD-10 Encounter Code Description Active Date MDM Diagnosis L97.821 Non-pressure chronic ulcer of other part of left lower leg limited to breakdown 08/29/2020 No Yes of skin I87.2 Venous insufficiency (chronic) (peripheral) 08/29/2020 No Yes Z92.241 Personal history of  systemic steroid therapy 08/29/2020 No Yes L97.819 Non-pressure chronic ulcer of other part of right lower leg with unspecified 09/26/2020 No Yes severity L97.428 Non-pressure chronic ulcer of left heel and midfoot with other specified 12/05/2020 No Yes severity L97.418 Non-pressure chronic ulcer of right heel and midfoot with other specified 12/05/2020 No Yes severity E11.621 Type 2 diabetes mellitus with foot ulcer 12/05/2020 No Yes Inactive Problems Resolved Problems Electronic Signature(s) Signed: 12/05/2020 5:06:48 PM By: Linton Ham MD Entered By: Linton Ham on 12/05/2020 13:33:46 -------------------------------------------------------------------------------- Progress Note Details Patient Name: Date of Service: Sonya Goodwin. 12/05/2020 12:30 PM Medical Record Number: [id number] Patient Account Number: [id number] Date of Birth/Sex: Treating RN: [birth year] (79 y.o. [name] Primary Care Provider: Nicholes Stairs Other Clinician: Referring Provider: Treating Provider/Extender: Nyra Jabs in Treatment: 14 Subjective History of Present Illness (HPI) We think they have been using silver alginateADMISSION 08/29/2020 This is a 79 year old woman who has wounds on her left lower leg mid aspect. She states that these have been present since June when she was hospitalized with a UTI, delirium possibly medication issues. I do not see any mention of the wounds on her left leg at that time. In any case these develop sometime after this. The patient is followed by Dr. Jacelyn Grip at [place] at [facility]. She has been using bacitracin ointment to the wounds. She has [facility] home health although I am not exactly sure what they are doing. Her husband is angry about a bill from home health again we were not able to help him exactly. The patient has chronic sarcoidosis and adrenal insufficiency she has been on longstanding prednisone. She has all the  classic skin features of chronic steroid use. I think this is the primary issue for these wounds. They have also noted weeping fluid coming out of the wounds and even weeping draining fluid on the right leg  although we were not able to define any wound on the right leg. She could very well have a component of chronic venous insufficiency as well. Past medical history includes sarcoidosis, type 2 diabetes with a recent hemoglobin A1c of 6.3, diabetic neuropathy, stage III P chronic renal failure, hypertension, adrenal insufficiency. ABI on the right noncompressible on the left at 1.02 9/14 2 small punched out areas of the left anterior mid tibia. We have been using Iodoflex. Better looking wound surfaces 9/28; the 2 areas on the left anterior mid tibia are about the same. She has a new weeping area on the right side just medial to the tibia. I think all of these wounds are weeping edema fluid from a collection of fluid in the upper third of her lower leg. The skin distally is more fibrosed and there is no room for weeping edema fluid. I wanted to put her in 3 layer compression versus kerlix and Coban but she will not agree to it 10/12; 2-week follow-up. She has the 2 areas on the left anterior mid tibia. There is scissor injury now on the left medial calf. In a single area on the right medial lower extremity. All of these are roughly the same small punched out surfaces. Weeping edema fluid. She does not tolerate anything more than kerlix Coban and even with this she finds this too tight often making her husband cut these off. I had ordered Iodoflex to these wounds but according to her husband that is not what home health is using. 11/9; patient missed her last appointment so she has not been seen here in almost a month. She has the 2 punched-out areas on the left mid tibia 1 medial and 1 lateral a small excoriation a little more superior and lateral also on the left but she has also punched out areas  on the right that she did not have last time I am not exactly sure how this happened. She complains about pain in her feet at night especially in the morning. Not really claudication but I was I was concerned. Her edema is satisfactorily concerned and the kerlix and Coban 12/7; again a monthly follow-up. I am not exactly sure how we got into this monthly pattern. She does have home health coming out 3 times a week. She did manage to get the arterial studies done that I ordered last time. On the right her ABI was noncompressible although she had triphasic waveforms at the dorsalis pedis her great toe pressure was unobtainable. On the left she had triphasic waveforms at the PTA and dorsalis pedis but again absent great toe waveforms. Segmental waveform analysis showed suggestion of posterior tibial artery occlusion on the right but a biphasic triphasic waveform with a normal normal examination on the left. The patient describes a lot of pain it is not clearly claudication but certainly there is a possibility here. She also has what I think is longstanding Raynaud's dating back to when she was a teenager She has new wounds on the left Achilles heel on the right Achilles heel from last time otherwise her wounds are about the same Objective Constitutional Patient is hypertensive.. Pulse regular and within target range for patient.Marland Kitchen Respirations regular, non-labored and within target range.. Temperature is normal and within the target range for the patient.Marland Kitchen Appears in no distress. Vitals Time Taken: 12:39 PM, Height: 66 in, Weight: 141 lbs, BMI: 22.8, Temperature: 97.5 F, Pulse: 84 bpm, Respiratory Rate: 18 breaths/min, Blood Pressure: 151/67 mmHg. Respiratory work  of breathing is normal. Cardiovascular Popliteal pulses are palpable. Dorsalis pedis pulses are palpable bilaterally however I cannot feel posterior tibial pulses on either side. General Notes: Wound exam ooLeft mid tibia has small  punched out areas medially and laterally. These are reasonably unchanged. On the right she has an area on the medial side ooShe has new areas x2 on the left Achilles heel and of 1 on the right. These are small punched out areas. ooHer feet are cool cyanotic at times. Integumentary (Hair, Skin) Wound #1 status is Open. Original cause of wound was Gradually Appeared. The wound is located on the Left,Anterior Lower Leg. The wound measures 0.8cm length x 0.9cm width x 0.2cm depth; 0.565cm^2 area and 0.113cm^3 volume. There is Fat Layer (Subcutaneous Tissue) exposed. There is no tunneling or undermining noted. There is a small amount of serosanguineous drainage noted. The wound margin is flat and intact. There is large (67-100%) red granulation within the wound bed. There is a small (1-33%) amount of necrotic tissue within the wound bed including Adherent Slough. Wound #2 status is Open. Original cause of wound was Gradually Appeared. The wound is located on the Left,Lateral Lower Leg. The wound measures 0cm length x 0cm width x 0cm depth; 0cm^2 area and 0cm^3 volume. Wound #3 status is Open. Original cause of wound was Trauma. The wound is located on the Right,Anterior Lower Leg. The wound measures 0.5cm length x 0.1cm width x 0.1cm depth; 0.039cm^2 area and 0.004cm^3 volume. There is Fat Layer (Subcutaneous Tissue) exposed. There is no tunneling or undermining noted. There is a small amount of serous drainage noted. The wound margin is flat and intact. There is medium (34-66%) red granulation within the wound bed. There is a medium (34-66%) amount of necrotic tissue within the wound bed including Adherent Slough. Wound #5 status is Open. Original cause of wound was Trauma. The wound is located on the Right,Lateral Lower Leg. The wound measures 0.8cm length x 0.5cm width x 0.1cm depth; 0.314cm^2 area and 0.031cm^3 volume. There is Fat Layer (Subcutaneous Tissue) exposed. There is no tunneling or  undermining noted. There is a small amount of serosanguineous drainage noted. The wound margin is flat and intact. There is medium (34-66%) red granulation within the wound bed. There is a medium (34-66%) amount of necrotic tissue within the wound bed including Adherent Slough. Wound #6 status is Open. Original cause of wound was Gradually Appeared. The wound is located on the Left,Medial Lower Leg. The wound measures 1.1cm length x 0.5cm width x 0.2cm depth; 0.432cm^2 area and 0.086cm^3 volume. There is Fat Layer (Subcutaneous Tissue) exposed. There is no tunneling or undermining noted. There is a small amount of serosanguineous drainage noted. The wound margin is well defined and not attached to the wound base. There is large (67-100%) pink granulation within the wound bed. There is a small (1-33%) amount of necrotic tissue within the wound bed including Adherent Slough. Wound #7 status is Open. Original cause of wound was Gradually Appeared. The wound is located on the Right,Proximal Calcaneus. The wound measures 0.4cm length x 1cm width x 0.1cm depth; 0.314cm^2 area and 0.031cm^3 volume. There is Fat Layer (Subcutaneous Tissue) exposed. There is no tunneling or undermining noted. There is a medium amount of serosanguineous drainage noted. The wound margin is distinct with the outline attached to the wound base. There is no granulation within the wound bed. There is a large (67-100%) amount of necrotic tissue within the wound bed including Adherent Slough. Wound #8  status is Open. Original cause of wound was Gradually Appeared. The wound is located on the Left,Distal Calcaneus. The wound measures 0.5cm length x 1.6cm width x 0.1cm depth; 0.628cm^2 area and 0.063cm^3 volume. There is Fat Layer (Subcutaneous Tissue) exposed. There is no tunneling or undermining noted. There is a medium amount of serosanguineous drainage noted. The wound margin is distinct with the outline attached to the wound  base. There is small (1-33%) red granulation within the wound bed. There is a large (67-100%) amount of necrotic tissue within the wound bed including Adherent Slough. Wound #9 status is Open. Original cause of wound was Gradually Appeared. The wound is located on the Right Calcaneus. The wound measures 0.4cm length x 1.5cm width x 0.1cm depth; 0.471cm^2 area and 0.047cm^3 volume. There is Fat Layer (Subcutaneous Tissue) exposed. There is no tunneling or undermining noted. There is a medium amount of serosanguineous drainage noted. The wound margin is distinct with the outline attached to the wound base. There is large (67-100%) red granulation within the wound bed. There is no necrotic tissue within the wound bed. 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 Bathing/ Shower/ Hygiene: May shower with protection but do not get wound dressing(s) wet. Edema Control - Lymphedema / SCD / Other: Elevate legs to the level of the heart or above for 30 minutes daily and/or when sitting, a frequency of: Avoid standing for long periods of time. Exercise regularly Home Health: New wound care orders this week; continue Home Health for wound care. May utilize formulary equivalent dressing for wound treatment orders unless otherwise specified. - 2 times per week WOUND #1: - Lower Leg Wound Laterality: Left, Anterior Cleanser: Normal Saline (Home Health) (Generic) 2 x Per Day/30 Days Discharge  Instructions: Cleanse the wound with Normal Saline prior to applying a clean dressing using gauze sponges, not tissue or cotton balls. Prim Dressing: Promogran Prisma Matrix, 4.34 (sq in) (silver collagen) (Home Health) (Generic) 2 x Per Day/30 Days ary Discharge Instructions: moisten with normal saline Secondary Dressing: Woven Gauze Sponge, Non-Sterile 4x4 in (Home Health) (Generic) 2 x Per Day/30 Days Discharge Instructions: Apply over primary dressing as directed. Secondary Dressing: ABD Pad, 5x9 (Home Health) 2 x Per Day/30 Days Discharge Instructions: Apply over primary dressing as directed. Com pression Wrap: Kerlix Roll 4.5x3.1 (in/yd) (Home Health) (Generic) 2 x Per Day/30 Days Discharge Instructions: Apply Kerlix and Coban compression as directed lightly . from base of toes to patella knotch Com pression Wrap: Coban Self-Adherent Wrap 4x5 (in/yd) (Home Health) (Generic) 2 x Per Day/30 Days Discharge Instructions: Apply over Kerlix as directed.from base of toes to patella knotch WOUND #2: - Lower Leg Wound Laterality: Left, Lateral Cleanser: Normal Saline (Home Health) (Generic) 2 x Per Day/30 Days Discharge Instructions: Cleanse the wound with Normal Saline prior to applying a clean dressing using gauze sponges, not tissue or cotton balls. Prim Dressing: Promogran Prisma Matrix, 4.34 (sq in) (silver collagen) (Home Health) (Generic) 2 x Per Day/30 Days ary Discharge Instructions: moisten with normal saline Secondary Dressing: Woven Gauze Sponge, Non-Sterile 4x4 in (  Home Health) (Generic) 2 x Per Day/30 Days Discharge Instructions: Apply over primary dressing as directed. Secondary Dressing: ABD Pad, 5x9 (Home Health) 2 x Per Day/30 Days Discharge Instructions: Apply over primary dressing as directed. Com pression Wrap: Kerlix Roll 4.5x3.1 (in/yd) (Home Health) (Generic) 2 x Per Day/30 Days Discharge Instructions: Apply Kerlix and Coban compression as directed.from base of toes to  patella knotch Com pression Wrap: Coban Self-Adherent Wrap 4x5 (in/yd) (Home Health) (Generic) 2 x Per Day/30 Days Discharge Instructions: Apply over Kerlix as directed.from base of toes to patella knotch WOUND #3: - Lower Leg Wound Laterality: Right, Anterior Cleanser: Normal Saline (Home Health) (Generic) 2 x Per Day/30 Days Discharge Instructions: Cleanse the wound with Normal Saline prior to applying a clean dressing using gauze sponges, not tissue or cotton balls. Prim Dressing: Promogran Prisma Matrix, 4.34 (sq in) (silver collagen) (Home Health) (Generic) 2 x Per Day/30 Days ary Discharge Instructions: moisten with normal saline Secondary Dressing: Woven Gauze Sponge, Non-Sterile 4x4 in (Home Health) (Generic) 2 x Per Day/30 Days Discharge Instructions: Apply over primary dressing as directed. Secondary Dressing: ABD Pad, 5x9 (Home Health) 2 x Per Day/30 Days Discharge Instructions: Apply over primary dressing as directed. Com pression Wrap: Kerlix Roll 4.5x3.1 (in/yd) (Home Health) (Generic) 2 x Per Day/30 Days Discharge Instructions: Apply Kerlix and Coban compression as directed.from base of toes to patella knotch Com pression Wrap: Coban Self-Adherent Wrap 4x5 (in/yd) (Home Health) (Generic) 2 x Per Day/30 Days Discharge Instructions: Apply over Kerlix as directed.from base of toes to patella knotch WOUND #5: - Lower Leg Wound Laterality: Right, Lateral Cleanser: Normal Saline (Home Health) (Generic) 2 x Per Day/30 Days Discharge Instructions: Cleanse the wound with Normal Saline prior to applying a clean dressing using gauze sponges, not tissue or cotton balls. Prim Dressing: Promogran Prisma Matrix, 4.34 (sq in) (silver collagen) (Home Health) (Generic) 2 x Per Day/30 Days ary Discharge Instructions: moisten with normal saline Secondary Dressing: Woven Gauze Sponge, Non-Sterile 4x4 in (Home Health) (Generic) 2 x Per Day/30 Days Discharge Instructions: Apply over primary dressing  as directed. Secondary Dressing: ABD Pad, 5x9 (Home Health) 2 x Per Day/30 Days Discharge Instructions: Apply over primary dressing as directed. Com pression Wrap: Kerlix Roll 4.5x3.1 (in/yd) (Home Health) (Generic) 2 x Per Day/30 Days Discharge Instructions: Apply Kerlix and Coban compression as directed. from base of toes to patella knotch Com pression Wrap: Coban Self-Adherent Wrap 4x5 (in/yd) (Home Health) (Generic) 2 x Per Day/30 Days Discharge Instructions: Apply over Kerlix as directed. from base of toes to patella knotch WOUND #6: - Lower Leg Wound Laterality: Left, Medial Cleanser: Normal Saline (Home Health) (Generic) 2 x Per Day/30 Days Discharge Instructions: Cleanse the wound with Normal Saline prior to applying a clean dressing using gauze sponges, not tissue or cotton balls. Prim Dressing: Promogran Prisma Matrix, 4.34 (sq in) (silver collagen) (Home Health) (Generic) 2 x Per Day/30 Days ary Discharge Instructions: moisten with normal saline Secondary Dressing: Woven Gauze Sponge, Non-Sterile 4x4 in (Home Health) (Generic) 2 x Per Day/30 Days Discharge Instructions: Apply over primary dressing as directed. Secondary Dressing: ABD Pad, 5x9 (Home Health) 2 x Per Day/30 Days Discharge Instructions: Apply over primary dressing as directed. Com pression Wrap: Kerlix Roll 4.5x3.1 (in/yd) (Home Health) (Generic) 2 x Per Day/30 Days Discharge Instructions: Apply Kerlix and Coban compression as directed.from base of toes to patella knotch Com pression Wrap: Coban Self-Adherent Wrap 4x5 (in/yd) (Home Health) (Generic) 2 x Per Day/30 Days Discharge Instructions: Apply over  Kerlix as directed.from base of toes to patella knotch WOUND #7: - Calcaneus Wound Laterality: Right, Proximal Cleanser: Normal Saline (Home Health) (Generic) 2 x Per Day/30 Days Discharge Instructions: Cleanse the wound with Normal Saline prior to applying a clean dressing using gauze sponges, not tissue or cotton  balls. Prim Dressing: Promogran Prisma Matrix, 4.34 (sq in) (silver collagen) (Home Health) (Generic) 2 x Per Day/30 Days ary Discharge Instructions: moisten with normal saline Secondary Dressing: Woven Gauze Sponge, Non-Sterile 4x4 in (Home Health) (Generic) 2 x Per Day/30 Days Discharge Instructions: Apply over primary dressing as directed. Secondary Dressing: ABD Pad, 5x9 (Home Health) 2 x Per Day/30 Days Discharge Instructions: Apply over primary dressing as directed. Com pression Wrap: Kerlix Roll 4.5x3.1 (in/yd) (Home Health) (Generic) 2 x Per Day/30 Days Discharge Instructions: Apply Kerlix and Coban compression as directed. from base of toes to patella knotch Com pression Wrap: Coban Self-Adherent Wrap 4x5 (in/yd) (Home Health) (Generic) 2 x Per Day/30 Days Discharge Instructions: Apply over Kerlix as directed.from base of toes to patella knotch WOUND #8: - Calcaneus Wound Laterality: Left, Distal Cleanser: Normal Saline (Home Health) (Generic) 2 x Per Day/30 Days Discharge Instructions: Cleanse the wound with Normal Saline prior to applying a clean dressing using gauze sponges, not tissue or cotton balls. Prim Dressing: Promogran Prisma Matrix, 4.34 (sq in) (silver collagen) (Home Health) (Generic) 2 x Per Day/30 Days ary Discharge Instructions: moisten with normal saline Secondary Dressing: Woven Gauze Sponge, Non-Sterile 4x4 in (Home Health) (Generic) 2 x Per Day/30 Days Discharge Instructions: Apply over primary dressing as directed. Secondary Dressing: ABD Pad, 5x9 (Home Health) 2 x Per Day/30 Days Discharge Instructions: Apply over primary dressing as directed. Com pression Wrap: Kerlix Roll 4.5x3.1 (in/yd) (Home Health) (Generic) 2 x Per Day/30 Days Discharge Instructions: Apply Kerlix and Coban compression as directed. from base of toes to patella knotch Com pression Wrap: Coban Self-Adherent Wrap 4x5 (in/yd) (Home Health) (Generic) 2 x Per Day/30 Days Discharge Instructions:  Apply over Kerlix as directed. from base of toes to patella knotch WOUND #9: - Calcaneus Wound Laterality: Right Cleanser: Normal Saline (Home Health) (Generic) 2 x Per Day/30 Days Discharge Instructions: Cleanse the wound with Normal Saline prior to applying a clean dressing using gauze sponges, not tissue or cotton balls. Prim Dressing: Promogran Prisma Matrix, 4.34 (sq in) (silver collagen) (Home Health) (Generic) 2 x Per Day/30 Days ary Discharge Instructions: moisten with normal saline Secondary Dressing: Woven Gauze Sponge, Non-Sterile 4x4 in (Home Health) (Generic) 2 x Per Day/30 Days Discharge Instructions: Apply over primary dressing as directed. Secondary Dressing: ABD Pad, 5x9 (Home Health) 2 x Per Day/30 Days Discharge Instructions: Apply over primary dressing as directed. Com pression Wrap: Kerlix Roll 4.5x3.1 (in/yd) (Home Health) (Generic) 2 x Per Day/30 Days Discharge Instructions: Apply Kerlix and Coban compression as directed.from base of toes to patella knotch Com pression Wrap: Coban Self-Adherent Wrap 4x5 (in/yd) (Home Health) (Generic) 2 x Per Day/30 Days Discharge Instructions: Apply over Kerlix as directed. from base of toes to patella knotch #1 no real change in the wounds on her anterior legs however she has new wounds on the left heel and right heel as described 2. I am going to change the dressing to silver collagen to see if we can stimulate some closure 3. I think the patient either has Raynaud's disease or possibly microvascular disease in her feet. 4. I am going to have her reviewed by vein and vascular. She also has chronic steroid use with  frail skin bilaterally I just want to make sure that were not missing anything from a macrovascular point of view. The patient describes a lot of pain although I am not exactly certain all of this is related to claudication. Electronic Signature(s) Signed: 12/05/2020 5:06:48 PM By: Linton Ham MD Entered By: Linton Ham on 12/05/2020 13:39:41 -------------------------------------------------------------------------------- SuperBill Details Patient Name: Date of Service: Sonya Goodwin 12/05/2020 Medical Record Number: [id number] Patient Account Number: [id number] Date of Birth/Sex: Treating RN: 24-Oct-1941 (79 y.o. 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

## 2020-12-06 DIAGNOSIS — M1710 Unilateral primary osteoarthritis, unspecified knee: Secondary | ICD-10-CM | POA: Diagnosis not present

## 2020-12-06 DIAGNOSIS — E2740 Unspecified adrenocortical insufficiency: Secondary | ICD-10-CM | POA: Diagnosis not present

## 2020-12-06 DIAGNOSIS — L97822 Non-pressure chronic ulcer of other part of left lower leg with fat layer exposed: Secondary | ICD-10-CM | POA: Diagnosis not present

## 2020-12-06 DIAGNOSIS — L97812 Non-pressure chronic ulcer of other part of right lower leg with fat layer exposed: Secondary | ICD-10-CM | POA: Diagnosis not present

## 2020-12-06 DIAGNOSIS — E1122 Type 2 diabetes mellitus with diabetic chronic kidney disease: Secondary | ICD-10-CM | POA: Diagnosis not present

## 2020-12-06 DIAGNOSIS — E1142 Type 2 diabetes mellitus with diabetic polyneuropathy: Secondary | ICD-10-CM | POA: Diagnosis not present

## 2020-12-06 DIAGNOSIS — E1143 Type 2 diabetes mellitus with diabetic autonomic (poly)neuropathy: Secondary | ICD-10-CM | POA: Diagnosis not present

## 2020-12-06 DIAGNOSIS — N1831 Chronic kidney disease, stage 3a: Secondary | ICD-10-CM | POA: Diagnosis not present

## 2020-12-06 DIAGNOSIS — I872 Venous insufficiency (chronic) (peripheral): Secondary | ICD-10-CM | POA: Diagnosis not present

## 2020-12-06 DIAGNOSIS — I129 Hypertensive chronic kidney disease with stage 1 through stage 4 chronic kidney disease, or unspecified chronic kidney disease: Secondary | ICD-10-CM | POA: Diagnosis not present

## 2020-12-07 DIAGNOSIS — I872 Venous insufficiency (chronic) (peripheral): Secondary | ICD-10-CM | POA: Diagnosis not present

## 2020-12-07 DIAGNOSIS — E1122 Type 2 diabetes mellitus with diabetic chronic kidney disease: Secondary | ICD-10-CM | POA: Diagnosis not present

## 2020-12-07 DIAGNOSIS — L97812 Non-pressure chronic ulcer of other part of right lower leg with fat layer exposed: Secondary | ICD-10-CM | POA: Diagnosis not present

## 2020-12-07 DIAGNOSIS — I129 Hypertensive chronic kidney disease with stage 1 through stage 4 chronic kidney disease, or unspecified chronic kidney disease: Secondary | ICD-10-CM | POA: Diagnosis not present

## 2020-12-07 DIAGNOSIS — L97822 Non-pressure chronic ulcer of other part of left lower leg with fat layer exposed: Secondary | ICD-10-CM | POA: Diagnosis not present

## 2020-12-07 DIAGNOSIS — E2740 Unspecified adrenocortical insufficiency: Secondary | ICD-10-CM | POA: Diagnosis not present

## 2020-12-07 DIAGNOSIS — E1142 Type 2 diabetes mellitus with diabetic polyneuropathy: Secondary | ICD-10-CM | POA: Diagnosis not present

## 2020-12-07 DIAGNOSIS — E1143 Type 2 diabetes mellitus with diabetic autonomic (poly)neuropathy: Secondary | ICD-10-CM | POA: Diagnosis not present

## 2020-12-07 DIAGNOSIS — M1710 Unilateral primary osteoarthritis, unspecified knee: Secondary | ICD-10-CM | POA: Diagnosis not present

## 2020-12-07 DIAGNOSIS — N1831 Chronic kidney disease, stage 3a: Secondary | ICD-10-CM | POA: Diagnosis not present

## 2020-12-11 DIAGNOSIS — E2740 Unspecified adrenocortical insufficiency: Secondary | ICD-10-CM | POA: Diagnosis not present

## 2020-12-11 DIAGNOSIS — N1831 Chronic kidney disease, stage 3a: Secondary | ICD-10-CM | POA: Diagnosis not present

## 2020-12-11 DIAGNOSIS — I872 Venous insufficiency (chronic) (peripheral): Secondary | ICD-10-CM | POA: Diagnosis not present

## 2020-12-11 DIAGNOSIS — E1142 Type 2 diabetes mellitus with diabetic polyneuropathy: Secondary | ICD-10-CM | POA: Diagnosis not present

## 2020-12-11 DIAGNOSIS — I129 Hypertensive chronic kidney disease with stage 1 through stage 4 chronic kidney disease, or unspecified chronic kidney disease: Secondary | ICD-10-CM | POA: Diagnosis not present

## 2020-12-11 DIAGNOSIS — E1143 Type 2 diabetes mellitus with diabetic autonomic (poly)neuropathy: Secondary | ICD-10-CM | POA: Diagnosis not present

## 2020-12-11 DIAGNOSIS — E1122 Type 2 diabetes mellitus with diabetic chronic kidney disease: Secondary | ICD-10-CM | POA: Diagnosis not present

## 2020-12-11 DIAGNOSIS — M1710 Unilateral primary osteoarthritis, unspecified knee: Secondary | ICD-10-CM | POA: Diagnosis not present

## 2020-12-11 DIAGNOSIS — L97812 Non-pressure chronic ulcer of other part of right lower leg with fat layer exposed: Secondary | ICD-10-CM | POA: Diagnosis not present

## 2020-12-11 DIAGNOSIS — L97822 Non-pressure chronic ulcer of other part of left lower leg with fat layer exposed: Secondary | ICD-10-CM | POA: Diagnosis not present

## 2020-12-13 DIAGNOSIS — S81811A Laceration without foreign body, right lower leg, initial encounter: Secondary | ICD-10-CM | POA: Diagnosis not present

## 2020-12-13 DIAGNOSIS — S81812A Laceration without foreign body, left lower leg, initial encounter: Secondary | ICD-10-CM | POA: Diagnosis not present

## 2020-12-14 DIAGNOSIS — E1122 Type 2 diabetes mellitus with diabetic chronic kidney disease: Secondary | ICD-10-CM | POA: Diagnosis not present

## 2020-12-14 DIAGNOSIS — M1710 Unilateral primary osteoarthritis, unspecified knee: Secondary | ICD-10-CM | POA: Diagnosis not present

## 2020-12-14 DIAGNOSIS — L97822 Non-pressure chronic ulcer of other part of left lower leg with fat layer exposed: Secondary | ICD-10-CM | POA: Diagnosis not present

## 2020-12-14 DIAGNOSIS — I872 Venous insufficiency (chronic) (peripheral): Secondary | ICD-10-CM | POA: Diagnosis not present

## 2020-12-14 DIAGNOSIS — L97812 Non-pressure chronic ulcer of other part of right lower leg with fat layer exposed: Secondary | ICD-10-CM | POA: Diagnosis not present

## 2020-12-14 DIAGNOSIS — E2740 Unspecified adrenocortical insufficiency: Secondary | ICD-10-CM | POA: Diagnosis not present

## 2020-12-14 DIAGNOSIS — E1142 Type 2 diabetes mellitus with diabetic polyneuropathy: Secondary | ICD-10-CM | POA: Diagnosis not present

## 2020-12-14 DIAGNOSIS — N1831 Chronic kidney disease, stage 3a: Secondary | ICD-10-CM | POA: Diagnosis not present

## 2020-12-14 DIAGNOSIS — I129 Hypertensive chronic kidney disease with stage 1 through stage 4 chronic kidney disease, or unspecified chronic kidney disease: Secondary | ICD-10-CM | POA: Diagnosis not present

## 2020-12-14 DIAGNOSIS — E1143 Type 2 diabetes mellitus with diabetic autonomic (poly)neuropathy: Secondary | ICD-10-CM | POA: Diagnosis not present

## 2020-12-18 DIAGNOSIS — M1710 Unilateral primary osteoarthritis, unspecified knee: Secondary | ICD-10-CM | POA: Diagnosis not present

## 2020-12-18 DIAGNOSIS — S81812A Laceration without foreign body, left lower leg, initial encounter: Secondary | ICD-10-CM | POA: Diagnosis not present

## 2020-12-18 DIAGNOSIS — L97812 Non-pressure chronic ulcer of other part of right lower leg with fat layer exposed: Secondary | ICD-10-CM | POA: Diagnosis not present

## 2020-12-18 DIAGNOSIS — L97822 Non-pressure chronic ulcer of other part of left lower leg with fat layer exposed: Secondary | ICD-10-CM | POA: Diagnosis not present

## 2020-12-18 DIAGNOSIS — E1122 Type 2 diabetes mellitus with diabetic chronic kidney disease: Secondary | ICD-10-CM | POA: Diagnosis not present

## 2020-12-18 DIAGNOSIS — E1143 Type 2 diabetes mellitus with diabetic autonomic (poly)neuropathy: Secondary | ICD-10-CM | POA: Diagnosis not present

## 2020-12-18 DIAGNOSIS — I129 Hypertensive chronic kidney disease with stage 1 through stage 4 chronic kidney disease, or unspecified chronic kidney disease: Secondary | ICD-10-CM | POA: Diagnosis not present

## 2020-12-18 DIAGNOSIS — N1831 Chronic kidney disease, stage 3a: Secondary | ICD-10-CM | POA: Diagnosis not present

## 2020-12-18 DIAGNOSIS — I872 Venous insufficiency (chronic) (peripheral): Secondary | ICD-10-CM | POA: Diagnosis not present

## 2020-12-18 DIAGNOSIS — E2740 Unspecified adrenocortical insufficiency: Secondary | ICD-10-CM | POA: Diagnosis not present

## 2020-12-18 DIAGNOSIS — E1142 Type 2 diabetes mellitus with diabetic polyneuropathy: Secondary | ICD-10-CM | POA: Diagnosis not present

## 2020-12-18 DIAGNOSIS — S81811A Laceration without foreign body, right lower leg, initial encounter: Secondary | ICD-10-CM | POA: Diagnosis not present

## 2020-12-21 DIAGNOSIS — L97812 Non-pressure chronic ulcer of other part of right lower leg with fat layer exposed: Secondary | ICD-10-CM | POA: Diagnosis not present

## 2020-12-21 DIAGNOSIS — M1710 Unilateral primary osteoarthritis, unspecified knee: Secondary | ICD-10-CM | POA: Diagnosis not present

## 2020-12-21 DIAGNOSIS — E1142 Type 2 diabetes mellitus with diabetic polyneuropathy: Secondary | ICD-10-CM | POA: Diagnosis not present

## 2020-12-21 DIAGNOSIS — I872 Venous insufficiency (chronic) (peripheral): Secondary | ICD-10-CM | POA: Diagnosis not present

## 2020-12-21 DIAGNOSIS — I129 Hypertensive chronic kidney disease with stage 1 through stage 4 chronic kidney disease, or unspecified chronic kidney disease: Secondary | ICD-10-CM | POA: Diagnosis not present

## 2020-12-21 DIAGNOSIS — N1831 Chronic kidney disease, stage 3a: Secondary | ICD-10-CM | POA: Diagnosis not present

## 2020-12-21 DIAGNOSIS — D631 Anemia in chronic kidney disease: Secondary | ICD-10-CM | POA: Diagnosis not present

## 2020-12-21 DIAGNOSIS — E1122 Type 2 diabetes mellitus with diabetic chronic kidney disease: Secondary | ICD-10-CM | POA: Diagnosis not present

## 2020-12-21 DIAGNOSIS — L97822 Non-pressure chronic ulcer of other part of left lower leg with fat layer exposed: Secondary | ICD-10-CM | POA: Diagnosis not present

## 2020-12-21 DIAGNOSIS — E1143 Type 2 diabetes mellitus with diabetic autonomic (poly)neuropathy: Secondary | ICD-10-CM | POA: Diagnosis not present

## 2020-12-25 DIAGNOSIS — L97812 Non-pressure chronic ulcer of other part of right lower leg with fat layer exposed: Secondary | ICD-10-CM | POA: Diagnosis not present

## 2020-12-25 DIAGNOSIS — I129 Hypertensive chronic kidney disease with stage 1 through stage 4 chronic kidney disease, or unspecified chronic kidney disease: Secondary | ICD-10-CM | POA: Diagnosis not present

## 2020-12-25 DIAGNOSIS — N1831 Chronic kidney disease, stage 3a: Secondary | ICD-10-CM | POA: Diagnosis not present

## 2020-12-25 DIAGNOSIS — D631 Anemia in chronic kidney disease: Secondary | ICD-10-CM | POA: Diagnosis not present

## 2020-12-25 DIAGNOSIS — L97822 Non-pressure chronic ulcer of other part of left lower leg with fat layer exposed: Secondary | ICD-10-CM | POA: Diagnosis not present

## 2020-12-25 DIAGNOSIS — E1142 Type 2 diabetes mellitus with diabetic polyneuropathy: Secondary | ICD-10-CM | POA: Diagnosis not present

## 2020-12-25 DIAGNOSIS — E1143 Type 2 diabetes mellitus with diabetic autonomic (poly)neuropathy: Secondary | ICD-10-CM | POA: Diagnosis not present

## 2020-12-25 DIAGNOSIS — E1122 Type 2 diabetes mellitus with diabetic chronic kidney disease: Secondary | ICD-10-CM | POA: Diagnosis not present

## 2020-12-25 DIAGNOSIS — I872 Venous insufficiency (chronic) (peripheral): Secondary | ICD-10-CM | POA: Diagnosis not present

## 2020-12-25 DIAGNOSIS — M1710 Unilateral primary osteoarthritis, unspecified knee: Secondary | ICD-10-CM | POA: Diagnosis not present

## 2020-12-27 DIAGNOSIS — E1122 Type 2 diabetes mellitus with diabetic chronic kidney disease: Secondary | ICD-10-CM | POA: Diagnosis not present

## 2020-12-27 DIAGNOSIS — E1142 Type 2 diabetes mellitus with diabetic polyneuropathy: Secondary | ICD-10-CM | POA: Diagnosis not present

## 2020-12-28 DIAGNOSIS — E1122 Type 2 diabetes mellitus with diabetic chronic kidney disease: Secondary | ICD-10-CM | POA: Diagnosis not present

## 2020-12-28 DIAGNOSIS — D631 Anemia in chronic kidney disease: Secondary | ICD-10-CM | POA: Diagnosis not present

## 2020-12-28 DIAGNOSIS — S81811A Laceration without foreign body, right lower leg, initial encounter: Secondary | ICD-10-CM | POA: Diagnosis not present

## 2020-12-28 DIAGNOSIS — M1710 Unilateral primary osteoarthritis, unspecified knee: Secondary | ICD-10-CM | POA: Diagnosis not present

## 2020-12-28 DIAGNOSIS — I129 Hypertensive chronic kidney disease with stage 1 through stage 4 chronic kidney disease, or unspecified chronic kidney disease: Secondary | ICD-10-CM | POA: Diagnosis not present

## 2020-12-28 DIAGNOSIS — E1143 Type 2 diabetes mellitus with diabetic autonomic (poly)neuropathy: Secondary | ICD-10-CM | POA: Diagnosis not present

## 2020-12-28 DIAGNOSIS — S81812A Laceration without foreign body, left lower leg, initial encounter: Secondary | ICD-10-CM | POA: Diagnosis not present

## 2020-12-28 DIAGNOSIS — L97822 Non-pressure chronic ulcer of other part of left lower leg with fat layer exposed: Secondary | ICD-10-CM | POA: Diagnosis not present

## 2020-12-28 DIAGNOSIS — I872 Venous insufficiency (chronic) (peripheral): Secondary | ICD-10-CM | POA: Diagnosis not present

## 2020-12-28 DIAGNOSIS — N1831 Chronic kidney disease, stage 3a: Secondary | ICD-10-CM | POA: Diagnosis not present

## 2020-12-28 DIAGNOSIS — E1142 Type 2 diabetes mellitus with diabetic polyneuropathy: Secondary | ICD-10-CM | POA: Diagnosis not present

## 2020-12-28 DIAGNOSIS — L97812 Non-pressure chronic ulcer of other part of right lower leg with fat layer exposed: Secondary | ICD-10-CM | POA: Diagnosis not present

## 2021-01-02 ENCOUNTER — Other Ambulatory Visit: Payer: Self-pay

## 2021-01-02 ENCOUNTER — Encounter (HOSPITAL_BASED_OUTPATIENT_CLINIC_OR_DEPARTMENT_OTHER): Payer: Medicare HMO | Attending: Internal Medicine | Admitting: Internal Medicine

## 2021-01-02 DIAGNOSIS — N183 Chronic kidney disease, stage 3 unspecified: Secondary | ICD-10-CM | POA: Insufficient documentation

## 2021-01-02 DIAGNOSIS — Z886 Allergy status to analgesic agent status: Secondary | ICD-10-CM | POA: Diagnosis not present

## 2021-01-02 DIAGNOSIS — E11622 Type 2 diabetes mellitus with other skin ulcer: Secondary | ICD-10-CM | POA: Diagnosis not present

## 2021-01-02 DIAGNOSIS — L97821 Non-pressure chronic ulcer of other part of left lower leg limited to breakdown of skin: Secondary | ICD-10-CM | POA: Diagnosis not present

## 2021-01-02 DIAGNOSIS — Z885 Allergy status to narcotic agent status: Secondary | ICD-10-CM | POA: Insufficient documentation

## 2021-01-02 DIAGNOSIS — Z888 Allergy status to other drugs, medicaments and biological substances status: Secondary | ICD-10-CM | POA: Insufficient documentation

## 2021-01-02 DIAGNOSIS — E1122 Type 2 diabetes mellitus with diabetic chronic kidney disease: Secondary | ICD-10-CM | POA: Insufficient documentation

## 2021-01-02 DIAGNOSIS — L97819 Non-pressure chronic ulcer of other part of right lower leg with unspecified severity: Secondary | ICD-10-CM | POA: Insufficient documentation

## 2021-01-02 DIAGNOSIS — E11621 Type 2 diabetes mellitus with foot ulcer: Secondary | ICD-10-CM | POA: Insufficient documentation

## 2021-01-02 DIAGNOSIS — I872 Venous insufficiency (chronic) (peripheral): Secondary | ICD-10-CM | POA: Insufficient documentation

## 2021-01-02 DIAGNOSIS — L97418 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: Secondary | ICD-10-CM | POA: Diagnosis not present

## 2021-01-02 DIAGNOSIS — E1140 Type 2 diabetes mellitus with diabetic neuropathy, unspecified: Secondary | ICD-10-CM | POA: Insufficient documentation

## 2021-01-02 DIAGNOSIS — L97822 Non-pressure chronic ulcer of other part of left lower leg with fat layer exposed: Secondary | ICD-10-CM | POA: Diagnosis not present

## 2021-01-02 DIAGNOSIS — Z92241 Personal history of systemic steroid therapy: Secondary | ICD-10-CM | POA: Insufficient documentation

## 2021-01-02 DIAGNOSIS — I7300 Raynaud's syndrome without gangrene: Secondary | ICD-10-CM | POA: Insufficient documentation

## 2021-01-02 DIAGNOSIS — S81812A Laceration without foreign body, left lower leg, initial encounter: Secondary | ICD-10-CM | POA: Diagnosis not present

## 2021-01-02 DIAGNOSIS — L97428 Non-pressure chronic ulcer of left heel and midfoot with other specified severity: Secondary | ICD-10-CM | POA: Insufficient documentation

## 2021-01-02 DIAGNOSIS — I129 Hypertensive chronic kidney disease with stage 1 through stage 4 chronic kidney disease, or unspecified chronic kidney disease: Secondary | ICD-10-CM | POA: Insufficient documentation

## 2021-01-02 NOTE — Progress Notes (Signed)
Sonya Goodwin, Sonya Goodwin ([id number]) Visit Report for 01/02/2021 Debridement Details Patient Name: Date of Service: [place], [name] 01/02/2021 12:30 PM Medical Record Number: [id number] Patient Account Number: [id number] Date of Birth/Sex: Treating RN: 1941-04-03 (80 y.o. Tonita Phoenix, Lauren Primary Care Provider: Nicholes Stairs Other Clinician: Referring Provider: Treating Provider/Extender: Nyra Jabs in Treatment: 18 Debridement Performed for Assessment: Wound #1 Left,Anterior Lower Leg Performed By: Physician Ricard Dillon., MD Debridement Type: Debridement Severity of Tissue Pre Debridement: Fat layer exposed Level of Consciousness (Pre-procedure): Awake and Alert Pre-procedure Verification/Time Out Yes - 13:34 Taken: Start Time: 13:34 Pain Control: Lidocaine T Area Debrided (L x W): otal 0.9 (cm) x 0.9 (cm) = 0.81 (cm) Tissue and other material debrided: Viable, Non-Viable, Subcutaneous Level: Skin/Subcutaneous Tissue Debridement Description: Excisional Instrument: Curette Bleeding: Minimum Hemostasis Achieved: Pressure End Time: 13:35 Procedural Pain: 0 Post Procedural Pain: 0 Response to Treatment: Procedure was tolerated well Level of Consciousness (Post- Awake and Alert procedure): Post Debridement Measurements of Total Wound Length: (cm) 0.9 Width: (cm) 0.9 Depth: (cm) 0.2 Volume: (cm) 0.127 Character of Wound/Ulcer Post Debridement: Improved Severity of Tissue Post Debridement: Fat layer exposed Post Procedure Diagnosis Same as Pre-procedure Electronic Signature(s) Signed: 01/02/2021 4:50:25 PM By: Linton Ham MD Signed: 01/02/2021 5:38:12 PM By: Rhae Hammock RN Entered By: Rhae Hammock on 01/02/2021 13:42:01 -------------------------------------------------------------------------------- Debridement Details Patient Name: Date of Service: Sonya Raider S. 01/02/2021 12:30 PM Medical Record Number:  [id number] Patient Account Number: [id number] Date of Birth/Sex: Treating RN: Jun 30, 1941 (80 y.o. Tonita Phoenix, Lauren Primary Care Provider: Nicholes Stairs Other Clinician: Referring Provider: Treating Provider/Extender: Nyra Jabs in Treatment: 18 Debridement Performed for Assessment: Wound #2 Left,Lateral Lower Leg Performed By: Physician Ricard Dillon., MD Debridement Type: Debridement Severity of Tissue Pre Debridement: Fat layer exposed Level of Consciousness (Pre-procedure): Awake and Alert Pre-procedure Verification/Time Out Yes - 13:35 Taken: Start Time: 13:35 Pain Control: Lidocaine T Area Debrided (L x W): otal 1 (cm) x 0.6 (cm) = 0.6 (cm) Tissue and other material debrided: Viable, Non-Viable, Subcutaneous Level: Skin/Subcutaneous Tissue Debridement Description: Excisional Instrument: Curette Bleeding: Minimum Hemostasis Achieved: Pressure End Time: 13:35 Procedural Pain: 0 Post Procedural Pain: 0 Response to Treatment: Procedure was tolerated well Level of Consciousness (Post- Awake and Alert procedure): Post Debridement Measurements of Total Wound Length: (cm) 1 Width: (cm) 0.6 Depth: (cm) 0.1 Volume: (cm) 0.047 Character of Wound/Ulcer Post Debridement: Improved Severity of Tissue Post Debridement: Fat layer exposed Post Procedure Diagnosis Same as Pre-procedure Electronic Signature(s) Signed: 01/02/2021 4:50:25 PM By: Linton Ham MD Signed: 01/02/2021 5:38:12 PM By: Rhae Hammock RN Entered By: Rhae Hammock on 01/02/2021 13:43:10 -------------------------------------------------------------------------------- HPI Details Patient Name: Date of Service: Sonya Goodwin, Sonya Raddle S. 01/02/2021 12:30 PM Medical Record Number: [id number] Patient Account Number: [id number] Date of Birth/Sex: Treating RN: 02-07-1941 (80 y.o. Benjaman Lobe Primary Care Provider: Nicholes Stairs Other Clinician: Referring  Provider: Treating Provider/Extender: Nyra Jabs in Treatment: 18 History of Present Illness HPI Description: We think they have been using silver alginateADMISSION 08/29/2020 This is a 80 year old woman who has wounds on her left lower leg mid aspect. She states that these have been present since June when she was hospitalized with a UTI, delirium possibly medication issues. I do not see any mention of the wounds on her left leg at that time. In any case these develop sometime after this. The patient is followed by Dr. Jacelyn Grip at [place]  [employer]. She has been using bacitracin ointment to the wounds. She has [facility] home health although I am not exactly sure what they are doing. Her husband is angry about a bill from home health again we were not able to help him exactly. The patient has chronic sarcoidosis and adrenal insufficiency she has been on longstanding prednisone. She has all the classic skin features of chronic steroid use. I think this is the primary issue for these wounds. They have also noted weeping fluid coming out of the wounds and even weeping draining fluid on the right leg although we were not able to define any wound on the right leg. She could very well have a component of chronic venous insufficiency as well. Past medical history includes sarcoidosis, type 2 diabetes with a recent hemoglobin A1c of 6.3, diabetic neuropathy, stage III P chronic renal failure, hypertension, adrenal insufficiency. ABI on the right noncompressible on the left at 1.02 9/14 2 small punched out areas of the left anterior mid tibia. We have been using Iodoflex. Better looking wound surfaces 9/28; the 2 areas on the left anterior mid tibia are about the same. She has a new weeping area on the right side just medial to the tibia. I think all of these wounds are weeping edema fluid from a collection of fluid in the upper third of her lower leg. The skin distally is more  fibrosed and there is no room for weeping edema fluid. I wanted to put her in 3 layer compression versus kerlix and Coban but she will not agree to it 10/12; 2-week follow-up. She has the 2 areas on the left anterior mid tibia. There is scissor injury now on the left medial calf. In a single area on the right medial lower extremity. All of these are roughly the same small punched out surfaces. Weeping edema fluid. She does not tolerate anything more than kerlix Coban and even with this she finds this too tight often making her husband cut these off. I had ordered Iodoflex to these wounds but according to her husband that is not what home health is using. 11/9; patient missed her last appointment so she has not been seen here in almost a month. She has the 2 punched-out areas on the left mid tibia 1 medial and 1 lateral a small excoriation a little more superior and lateral also on the left but she has also punched out areas on the right that she did not have last time I am not exactly sure how this happened. She complains about pain in her feet at night especially in the morning. Not really claudication but I was I was concerned. Her edema is satisfactorily concerned and the kerlix and Coban 12/7; again a monthly follow-up. I am not exactly sure how we got into this monthly pattern. She does have home health coming out 3 times a week. She did manage to get the arterial studies done that I ordered last time. On the right her ABI was noncompressible although she had triphasic waveforms at the dorsalis pedis her great toe pressure was unobtainable. On the left she had triphasic waveforms at the PTA and dorsalis pedis but again absent great toe waveforms. Segmental waveform analysis showed suggestion of posterior tibial artery occlusion on the right but a biphasic triphasic waveform with a normal normal examination on the left. The patient describes a lot of pain it is not clearly claudication but  certainly there is a possibility here. She also has what  I think is longstanding Raynaud's dating back to when she was a teenager She has new wounds on the left Achilles heel on the right Achilles heel from last time otherwise her wounds are about the same 1/4; the patient has an appointment with vascular on 01/09/2021. Basically I am asking if there is macrovascular issues that need to be addressed she still complains of a lot of pain in her feet and heels when she walks although I am not sure this is claudication. She also has longstanding Raynaud's by her description dating back to when she was a teenager. She has small punched-out areas on the left anterior lower x2, left anterior leg left heel and right heel. She tells me that the home health nurse stopped wrapping the right leg about 2 weeks ago there is a lot of edema in this leg threatening to break open. Small wound on the right lateral leg. Electronic Signature(s) Signed: 01/02/2021 4:50:25 PM By: Linton Ham MD Entered By: Linton Ham on 01/02/2021 13:40:33 -------------------------------------------------------------------------------- Physical Exam Details Patient Name: Date of Service: Sonya Chambers. 01/02/2021 12:30 PM Medical Record Number: [id number] Patient Account Number: [id number] Date of Birth/Sex: Treating RN: 07-25-1941 (80 y.o. Tonita Phoenix, Lauren Primary Care Provider: Nicholes Stairs Other Clinician: Referring Provider: Treating Provider/Extender: Nyra Jabs in Treatment: 18 Constitutional Sitting or standing Blood Pressure is within target range for patient.. Pulse regular and within target range for patient.Marland Kitchen Respirations regular, non-labored and within target range.. Temperature is normal and within the target range for the patient.Marland Kitchen Appears in no distress. Notes Wound exam; left mid tibia and lateral tibia small punched out areas. Both of these require debridement of  subcutaneous debris. There is healthy granulation here however very fragile skin. There is also a more superficial area on the left anterior upper tibia. Punched-out area on the left heel On the right she has a small open area on the right lateral. Most of the heel appears to be closed Electronic Signature(s) Signed: 01/02/2021 4:50:25 PM By: Linton Ham MD Entered By: Linton Ham on 01/02/2021 13:42:11 -------------------------------------------------------------------------------- Physician Orders Details Patient Name: Date of Service: Sonya Chambers. 01/02/2021 12:30 PM Medical Record Number: [id number] Patient Account Number: [id number] Date of Birth/Sex: Treating RN: 09/30/1941 (80 y.o. Tonita Phoenix, Lauren Primary Care Provider: Nicholes Stairs Other Clinician: Referring Provider: Treating Provider/Extender: Nyra Jabs in Treatment: 20 Verbal / Phone Orders: No Diagnosis Coding Follow-up Appointments Return A ppointment in 2 weeks. Other: - Please be sure to go to F/U appt. with Vascular Dr. on January 11th!!! Bathing/ Shower/ Hygiene May shower with protection but do not get wound dressing(s) wet. Edema Control - Lymphedema / SCD / Other Elevate legs to the level of the heart or above for 30 minutes daily and/or when sitting, a frequency of: Avoid standing for long periods of time. Exercise regularly [place] wound care orders this week; continue Home Health for wound care. May utilize formulary equivalent dressing for wound treatment orders unless otherwise specified. [facility] . 2 times per week Wound Treatment Wound #1 - Lower Leg Wound Laterality: Left, Anterior Cleanser: Normal Saline (Home Health) (Generic) 2 x Per Day/30 Days Discharge Instructions: Cleanse the wound with Normal Saline prior to applying a clean dressing using gauze sponges, not tissue or cotton balls. Prim Dressing: Promogran Prisma Matrix, 4.34 (sq in)  (silver collagen) (Home Health) (Generic) 2 x Per Day/30 Days ary Discharge Instructions: moisten with normal  saline Secondary Dressing: Woven Gauze Sponge, Non-Sterile 4x4 in (Home Health) (Generic) 2 x Per Day/30 Days Discharge Instructions: Apply over primary dressing as directed. Secondary Dressing: ABD Pad, 5x9 (Home Health) 2 x Per Day/30 Days Discharge Instructions: Apply over primary dressing as directed. Compression Wrap: Kerlix Roll 4.5x3.1 (in/yd) (Home Health) (Generic) 2 x Per Day/30 Days Discharge Instructions: Apply Kerlix and Coban compression as directed lightly . from base of toes to patella knotch Compression Wrap: Coban Self-Adherent Wrap 4x5 (in/yd) (Home Health) (Generic) 2 x Per Day/30 Days Discharge Instructions: Apply over Kerlix as directed.from base of toes to patella knotch Wound #2 - Lower Leg Wound Laterality: Left, Lateral Cleanser: Normal Saline (Home Health) (Generic) 2 x Per Day/30 Days Discharge Instructions: Cleanse the wound with Normal Saline prior to applying a clean dressing using gauze sponges, not tissue or cotton balls. Prim Dressing: Promogran Prisma Matrix, 4.34 (sq in) (silver collagen) (Home Health) (Generic) 2 x Per Day/30 Days ary Discharge Instructions: moisten with normal saline Secondary Dressing: Woven Gauze Sponge, Non-Sterile 4x4 in (Home Health) (Generic) 2 x Per Day/30 Days Discharge Instructions: Apply over primary dressing as directed. Secondary Dressing: ABD Pad, 5x9 (Home Health) 2 x Per Day/30 Days Discharge Instructions: Apply over primary dressing as directed. Compression Wrap: Kerlix Roll 4.5x3.1 (in/yd) (Home Health) (Generic) 2 x Per Day/30 Days Discharge Instructions: Apply Kerlix and Coban compression as directed.from base of toes to patella knotch Compression Wrap: Coban Self-Adherent Wrap 4x5 (in/yd) (Home Health) (Generic) 2 x Per Day/30 Days Discharge Instructions: Apply over Kerlix as directed.from base of toes to  patella knotch Wound #5 - Lower Leg Wound Laterality: Right, Lateral Cleanser: Normal Saline (Home Health) (Generic) 2 x Per Day/30 Days Discharge Instructions: Cleanse the wound with Normal Saline prior to applying a clean dressing using gauze sponges, not tissue or cotton balls. Prim Dressing: Promogran Prisma Matrix, 4.34 (sq in) (silver collagen) (Home Health) (Generic) 2 x Per Day/30 Days ary Discharge Instructions: moisten with normal saline Secondary Dressing: Woven Gauze Sponge, Non-Sterile 4x4 in (Home Health) (Generic) 2 x Per Day/30 Days Discharge Instructions: Apply over primary dressing as directed. Secondary Dressing: ABD Pad, 5x9 (Home Health) 2 x Per Day/30 Days Discharge Instructions: Apply over primary dressing as directed. Compression Wrap: Kerlix Roll 4.5x3.1 (in/yd) (Home Health) (Generic) 2 x Per Day/30 Days Discharge Instructions: Apply Kerlix and Coban compression as directed. from base of toes to patella knotch Compression Wrap: Coban Self-Adherent Wrap 4x5 (in/yd) (Home Health) (Generic) 2 x Per Day/30 Days Discharge Instructions: Apply over Kerlix as directed. from base of toes to patella knotch Wound #6 - Lower Leg Wound Laterality: Left, Medial Cleanser: Normal Saline (Home Health) (Generic) 2 x Per Day/30 Days Discharge Instructions: Cleanse the wound with Normal Saline prior to applying a clean dressing using gauze sponges, not tissue or cotton balls. Prim Dressing: Promogran Prisma Matrix, 4.34 (sq in) (silver collagen) (Home Health) (Generic) 2 x Per Day/30 Days ary Discharge Instructions: moisten with normal saline Secondary Dressing: Woven Gauze Sponge, Non-Sterile 4x4 in (Home Health) (Generic) 2 x Per Day/30 Days Discharge Instructions: Apply over primary dressing as directed. Secondary Dressing: ABD Pad, 5x9 (Home Health) 2 x Per Day/30 Days Discharge Instructions: Apply over primary dressing as directed. Compression Wrap: Kerlix Roll 4.5x3.1 (in/yd)  (Home Health) (Generic) 2 x Per Day/30 Days Discharge Instructions: Apply Kerlix and Coban compression as directed.from base of toes to patella knotch Compression Wrap: Coban Self-Adherent Wrap 4x5 (in/yd) (Home Health) (Generic) 2 x Per Day/30 Days  Discharge Instructions: Apply over Kerlix as directed.from base of toes to patella knotch Wound #8 - Calcaneus Wound Laterality: Left, Distal Cleanser: Normal Saline (Home Health) (Generic) 2 x Per Day/30 Days Discharge Instructions: Cleanse the wound with Normal Saline prior to applying a clean dressing using gauze sponges, not tissue or cotton balls. Prim Dressing: Promogran Prisma Matrix, 4.34 (sq in) (silver collagen) (Home Health) (Generic) 2 x Per Day/30 Days ary Discharge Instructions: moisten with normal saline Secondary Dressing: Woven Gauze Sponge, Non-Sterile 4x4 in (Home Health) (Generic) 2 x Per Day/30 Days Discharge Instructions: Apply over primary dressing as directed. Secondary Dressing: ABD Pad, 5x9 (Home Health) 2 x Per Day/30 Days Discharge Instructions: Apply over primary dressing as directed. Compression Wrap: Kerlix Roll 4.5x3.1 (in/yd) (Home Health) (Generic) 2 x Per Day/30 Days Discharge Instructions: Apply Kerlix and Coban compression as directed. from base of toes to patella knotch Compression Wrap: Coban Self-Adherent Wrap 4x5 (in/yd) (Home Health) (Generic) 2 x Per Day/30 Days Discharge Instructions: Apply over Kerlix as directed. from base of toes to patella knotch Wound #9 - Calcaneus Wound Laterality: Right Cleanser: Normal Saline (Home Health) (Generic) 2 x Per Day/30 Days Discharge Instructions: Cleanse the wound with Normal Saline prior to applying a clean dressing using gauze sponges, not tissue or cotton balls. Prim Dressing: Promogran Prisma Matrix, 4.34 (sq in) (silver collagen) (Home Health) (Generic) 2 x Per Day/30 Days ary Discharge Instructions: moisten with normal saline Secondary Dressing: Woven Gauze  Sponge, Non-Sterile 4x4 in (Home Health) (Generic) 2 x Per Day/30 Days Discharge Instructions: Apply over primary dressing as directed. Secondary Dressing: ABD Pad, 5x9 (Home Health) 2 x Per Day/30 Days Discharge Instructions: Apply over primary dressing as directed. Compression Wrap: Kerlix Roll 4.5x3.1 (in/yd) (Home Health) (Generic) 2 x Per Day/30 Days Discharge Instructions: Apply Kerlix and Coban compression as directed.from base of toes to patella knotch Compression Wrap: Coban Self-Adherent Wrap 4x5 (in/yd) (Home Health) (Generic) 2 x Per Day/30 Days Discharge Instructions: Apply over Kerlix as directed. from base of toes to patella knotch Electronic Signature(s) Signed: 01/02/2021 4:50:25 PM By: Linton Ham MD Signed: 01/02/2021 5:38:12 PM By: Rhae Hammock RN Entered By: Rhae Hammock on 01/02/2021 13:39:31 -------------------------------------------------------------------------------- Problem List Details Patient Name: Date of Service: Sonya Chambers. 01/02/2021 12:30 PM Medical Record Number: [id number] Patient Account Number: [id number] Date of Birth/Sex: Treating RN: July 16, 1941 (80 y.o. Tonita Phoenix, Lauren Primary Care Provider: Other Clinician: Nicholes Stairs Referring Provider: Treating Provider/Extender: Nyra Jabs in Treatment: 18 Active Problems ICD-10 Encounter Code Description Active Date MDM Diagnosis L97.821 Non-pressure chronic ulcer of other part of left lower leg limited to breakdown 08/29/2020 No Yes of skin I87.2 Venous insufficiency (chronic) (peripheral) 08/29/2020 No Yes Z92.241 Personal history of systemic steroid therapy 08/29/2020 No Yes L97.819 Non-pressure chronic ulcer of other part of right lower leg with unspecified 09/26/2020 No Yes severity L97.428 Non-pressure chronic ulcer of left heel and midfoot with other specified 12/05/2020 No Yes severity L97.418 Non-pressure chronic ulcer of right heel and  midfoot with other specified 12/05/2020 No Yes severity E11.621 Type 2 diabetes mellitus with foot ulcer 12/05/2020 No Yes Inactive Problems Resolved Problems Electronic Signature(s) Signed: 01/02/2021 4:50:25 PM By: Linton Ham MD Entered By: Linton Ham on 01/02/2021 13:36:10 -------------------------------------------------------------------------------- Progress Note Details Patient Name: Date of Service: Sonya Chambers. 01/02/2021 12:30 PM Medical Record Number: [id number] Patient Account Number: [id number] Date of Birth/Sex: Treating RN: Apr 22, 1941 (80 y.o. Tonita Phoenix, [place] Primary Care Provider: Hulan Fess  L Other Clinician: Referring Provider: Treating Provider/Extender: Nyra Jabs in Treatment: 18 Subjective History of Present Illness (HPI) We think they have been using silver alginateADMISSION 08/29/2020 This is a 80 year old woman who has wounds on her left lower leg mid aspect. She states that these have been present since June when she was hospitalized with a UTI, delirium possibly medication issues. I do not see any mention of the wounds on her left leg at that time. In any case these develop sometime after this. The patient is followed by Dr. Jacelyn Grip at [place] at [facility]. She has been using bacitracin ointment to the wounds. She has [facility] home health although I am not exactly sure what they are doing. Her husband is angry about a bill from home health again we were not able to help him exactly. The patient has chronic sarcoidosis and adrenal insufficiency she has been on longstanding prednisone. She has all the classic skin features of chronic steroid use. I think this is the primary issue for these wounds. They have also noted weeping fluid coming out of the wounds and even weeping draining fluid on the right leg although we were not able to define any wound on the right leg. She could very well have a component of  chronic venous insufficiency as well. Past medical history includes sarcoidosis, type 2 diabetes with a recent hemoglobin A1c of 6.3, diabetic neuropathy, stage III P chronic renal failure, hypertension, adrenal insufficiency. ABI on the right noncompressible on the left at 1.02 9/14 2 small punched out areas of the left anterior mid tibia. We have been using Iodoflex. Better looking wound surfaces 9/28; the 2 areas on the left anterior mid tibia are about the same. She has a new weeping area on the right side just medial to the tibia. I think all of these wounds are weeping edema fluid from a collection of fluid in the upper third of her lower leg. The skin distally is more fibrosed and there is no room for weeping edema fluid. I wanted to put her in 3 layer compression versus kerlix and Coban but she will not agree to it 10/12; 2-week follow-up. She has the 2 areas on the left anterior mid tibia. There is scissor injury now on the left medial calf. In a single area on the right medial lower extremity. All of these are roughly the same small punched out surfaces. Weeping edema fluid. She does not tolerate anything more than kerlix Coban and even with this she finds this too tight often making her husband cut these off. I had ordered Iodoflex to these wounds but according to her husband that is not what home health is using. 11/9; patient missed her last appointment so she has not been seen here in almost a month. She has the 2 punched-out areas on the left mid tibia 1 medial and 1 lateral a small excoriation a little more superior and lateral also on the left but she has also punched out areas on the right that she did not have last time I am not exactly sure how this happened. She complains about pain in her feet at night especially in the morning. Not really claudication but I was I was concerned. Her edema is satisfactorily concerned and the kerlix and Coban 12/7; again a monthly follow-up. I am  not exactly sure how we got into this monthly pattern. She does have home health coming out 3 times a week. She did manage to  get the arterial studies done that I ordered last time. On the right her ABI was noncompressible although she had triphasic waveforms at the dorsalis pedis her great toe pressure was unobtainable. On the left she had triphasic waveforms at the PTA and dorsalis pedis but again absent great toe waveforms. Segmental waveform analysis showed suggestion of posterior tibial artery occlusion on the right but a biphasic triphasic waveform with a normal normal examination on the left. The patient describes a lot of pain it is not clearly claudication but certainly there is a possibility here. She also has what I think is longstanding Raynaud's dating back to when she was a teenager She has new wounds on the left Achilles heel on the right Achilles heel from last time otherwise her wounds are about the same 1/4; the patient has an appointment with vascular on 01/09/2021. Basically I am asking if there is macrovascular issues that need to be addressed she still complains of a lot of pain in her feet and heels when she walks although I am not sure this is claudication. She also has longstanding Raynaud's by her description dating back to when she was a teenager. She has small punched-out areas on the left anterior lower x2, left anterior leg left heel and right heel. She tells me that the home health nurse stopped wrapping the right leg about 2 weeks ago there is a lot of edema in this leg threatening to break open. Small wound on the right lateral leg. Objective Constitutional Sitting or standing Blood Pressure is within target range for patient.. Pulse regular and within target range for patient.Marland Kitchen Respirations regular, non-labored and within target range.. Temperature is normal and within the target range for the patient.Marland Kitchen Appears in no distress. Vitals Time Taken: 12:53 PM, Height: 66  in, Weight: 141 lbs, BMI: 22.8, Temperature: 98.5 F, Pulse: 123 bpm, Respiratory Rate: 18 breaths/min, Blood Pressure: 132/81 mmHg. General Notes: Wound exam; left mid tibia and lateral tibia small punched out areas. Both of these require debridement of subcutaneous debris. There is healthy granulation here however very fragile skin. There is also a more superficial area on the left anterior upper tibia. Punched-out area on the left heel ooOn the right she has a small open area on the right lateral. Most of the heel appears to be closed Integumentary (Hair, Skin) Wound #1 status is Open. Original cause of wound was Gradually Appeared. The wound is located on the Left,Anterior Lower Leg. The wound measures 0.9cm length x 0.9cm width x 0.2cm depth; 0.636cm^2 area and 0.127cm^3 volume. There is Fat Layer (Subcutaneous Tissue) exposed. There is no tunneling or undermining noted. There is a small amount of serosanguineous drainage noted. The wound margin is flat and intact. There is large (67-100%) red granulation within the wound bed. There is a small (1-33%) amount of necrotic tissue within the wound bed including Adherent Slough. Wound #2 status is Open. Original cause of wound was Gradually Appeared. The wound is located on the Left,Lateral Lower Leg. The wound measures 1cm length x 0.6cm width x 0.1cm depth; 0.471cm^2 area and 0.047cm^3 volume. There is no tunneling or undermining noted. There is a medium amount of serosanguineous drainage noted. There is medium (34-66%) red, pink granulation within the wound bed. There is a medium (34-66%) amount of necrotic tissue within the wound bed including Adherent Slough. Wound #3 status is Open. Original cause of wound was Trauma. The wound is located on the Right,Anterior Lower Leg. The wound measures 0cm length  x 0cm width x 0cm depth; 0cm^2 area and 0cm^3 volume. Wound #5 status is Open. Original cause of wound was Trauma. The wound is located on the  Right,Lateral Lower Leg. The wound measures 0.5cm length x 0.5cm width x 0.1cm depth; 0.196cm^2 area and 0.02cm^3 volume. There is Fat Layer (Subcutaneous Tissue) exposed. There is no tunneling or undermining noted. There is a small amount of serosanguineous drainage noted. The wound margin is flat and intact. There is medium (34-66%) red granulation within the wound bed. There is a medium (34-66%) amount of necrotic tissue within the wound bed including Adherent Slough. Wound #6 status is Open. Original cause of wound was Gradually Appeared. The wound is located on the Left,Medial Lower Leg. The wound measures 0.9cm length x 0.6cm width x 0.1cm depth; 0.424cm^2 area and 0.042cm^3 volume. Wound #7 status is Open. Original cause of wound was Gradually Appeared. The wound is located on the Right,Proximal Calcaneus. The wound measures 0cm length x 0cm width x 0cm depth; 0cm^2 area and 0cm^3 volume. Wound #8 status is Open. Original cause of wound was Gradually Appeared. The wound is located on the Left,Distal Calcaneus. The wound measures 0.3cm length x 1.3cm width x 0.1cm depth; 0.306cm^2 area and 0.031cm^3 volume. There is Fat Layer (Subcutaneous Tissue) exposed. There is no tunneling or undermining noted. There is a medium amount of serosanguineous drainage noted. The wound margin is distinct with the outline attached to the wound base. There is small (1-33%) red granulation within the wound bed. There is a large (67-100%) amount of necrotic tissue within the wound bed including Adherent Slough. Wound #9 status is Open. Original cause of wound was Gradually Appeared. The wound is located on the Right Calcaneus. The wound measures 0.2cm length x 0.6cm width x 0.1cm depth; 0.094cm^2 area and 0.009cm^3 volume. There is Fat Layer (Subcutaneous Tissue) exposed. There is no tunneling or undermining noted. There is a medium amount of serosanguineous drainage noted. The wound margin is distinct with the  outline attached to the wound base. There is large (67-100%) red granulation within the wound bed. There is no necrotic tissue within the wound bed. 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 There was a Excisional Skin/Subcutaneous Tissue Debridement with a total area of 0.81 sq cm performed by Sonya Goodwin, Sonya Szeliga G., MD. With the following instrument(s): Curette to remove Viable and Non-Viable tissue/material. Material removed includes Subcutaneous Tissue after achieving pain control using Lidocaine. No specimens were taken. A time out was conducted at 13:34, prior to the start of the procedure. A Minimum amount of bleeding was controlled with Pressure. The procedure was tolerated well with a pain level of 0 throughout and a pain level of 0 following the procedure. Post Debridement Measurements: 0.9cm length x 0.9cm width x 0.2cm depth; 0.127cm^3 volume. Character of Wound/Ulcer Post Debridement is improved. Severity of Tissue Post Debridement is: Fat layer exposed. Post procedure Diagnosis Wound #1: Same as Pre-Procedure Wound #2 Pre-procedure diagnosis  of Wound #2 is a Diabetic Wound/Ulcer of the Lower Extremity located on the Left,Lateral Lower Leg .Severity of Tissue Pre Debridement is: Fat layer exposed. There was a Excisional Skin/Subcutaneous Tissue Debridement  with a total area of 0.6 sq cm performed by Ricard Dillon., MD. With the following instrument(s): Curette to remove Viable and Non-Viable tissue/material. Material removed includes Subcutaneous Tissue after achieving pain control using Lidocaine. No specimens were taken. A time out was conducted at 13:35, prior to the start of the procedure. A Minimum amount of bleeding was controlled with Pressure. The procedure was tolerated well with a pain level of 0 throughout and a pain level of 0 following the procedure. Post Debridement Measurements: 1cm length x 0.6cm width x 0.1cm depth; 0.047cm^3 volume. Character of Wound/Ulcer Post Debridement is improved. Severity of Tissue Post Debridement is: Fat layer exposed. Post procedure Diagnosis Wound #2: Same as Pre-Procedure Plan Follow-up Appointments: Return Appointment in 2 weeks. Other: - Please be sure to go to F/U appt. with Vascular Dr. on January 11th!!! Bathing/ Shower/ Hygiene: May shower with protection but do not get wound dressing(s) wet. Edema Control - Lymphedema / SCD / Other: Elevate legs to the level of the heart or above for 30 minutes daily and/or when sitting, a frequency of: Avoid standing for long periods of time. Exercise regularly Home Health: New wound care orders this week; continue Home Health for wound care. May utilize formulary equivalent dressing for wound treatment orders unless otherwise specified. [facility] . 2 times per week WOUND #1: - Lower Leg Wound Laterality: Left, Anterior Cleanser: Normal Saline (Lena) (Generic) 2 x Per Day/30 Days Discharge Instructions: Cleanse the wound with Normal Saline prior to applying a clean dressing using gauze sponges, not tissue or cotton balls. Prim Dressing: Promogran Prisma Matrix, 4.34 (sq in) (silver collagen) (Home Health) (Generic) 2 x Per Day/30 Days ary Discharge Instructions: moisten with normal saline Secondary Dressing: Woven Gauze Sponge, Non-Sterile 4x4  in (Home Health) (Generic) 2 x Per Day/30 Days Discharge Instructions: Apply over primary dressing as directed. Secondary Dressing: ABD Pad, 5x9 (Home Health) 2 x Per Day/30 Days Discharge Instructions: Apply over primary dressing as directed. Com pression Wrap: Kerlix Roll 4.5x3.1 (in/yd) (Home Health) (Generic) 2 x Per Day/30 Days Discharge Instructions: Apply Kerlix and Coban compression as directed lightly . from base of toes to patella knotch Com pression Wrap: Coban Self-Adherent Wrap 4x5 (in/yd) (Home Health) (Generic) 2 x Per Day/30 Days Discharge Instructions: Apply over Kerlix as directed.from base of toes to patella knotch WOUND #2: - Lower Leg Wound Laterality: Left, Lateral Cleanser: Normal Saline (Home Health) (Generic) 2 x Per Day/30 Days Discharge Instructions: Cleanse the wound with Normal Saline prior to applying a clean dressing using gauze sponges, not tissue or cotton balls. Prim Dressing: Promogran Prisma Matrix, 4.34 (sq in) (silver collagen) (Home Health) (Generic) 2 x Per Day/30 Days ary Discharge Instructions: moisten with normal saline Secondary Dressing: Woven Gauze Sponge, Non-Sterile 4x4 in (Home Health) (Generic) 2 x Per Day/30 Days Discharge Instructions: Apply over primary dressing as directed. Secondary Dressing: ABD Pad, 5x9 (Home Health) 2 x Per Day/30 Days Discharge Instructions: Apply over primary dressing as directed. Com pression Wrap: Kerlix Roll 4.5x3.1 (in/yd) (Home Health) (Generic) 2 x Per Day/30 Days Discharge Instructions: Apply Kerlix and Coban compression as directed.from base of toes to patella knotch Com pression Wrap: Coban Self-Adherent Wrap 4x5 (in/yd) (Home Health) (Generic) 2 x Per Day/30 Days Discharge Instructions: Apply over Kerlix as  directed.from base of toes to patella knotch WOUND #5: - Lower Leg Wound Laterality: Right, Lateral Cleanser: Normal Saline (Home Health) (Generic) 2 x Per Day/30 Days Discharge Instructions: Cleanse the  wound with Normal Saline prior to applying a clean dressing using gauze sponges, not tissue or cotton balls. Prim Dressing: Promogran Prisma Matrix, 4.34 (sq in) (silver collagen) (Home Health) (Generic) 2 x Per Day/30 Days ary Discharge Instructions: moisten with normal saline Secondary Dressing: Woven Gauze Sponge, Non-Sterile 4x4 in (Home Health) (Generic) 2 x Per Day/30 Days Discharge Instructions: Apply over primary dressing as directed. Secondary Dressing: ABD Pad, 5x9 (Home Health) 2 x Per Day/30 Days Discharge Instructions: Apply over primary dressing as directed. Com pression Wrap: Kerlix Roll 4.5x3.1 (in/yd) (Home Health) (Generic) 2 x Per Day/30 Days Discharge Instructions: Apply Kerlix and Coban compression as directed. from base of toes to patella knotch Com pression Wrap: Coban Self-Adherent Wrap 4x5 (in/yd) (Home Health) (Generic) 2 x Per Day/30 Days Discharge Instructions: Apply over Kerlix as directed. from base of toes to patella knotch WOUND #6: - Lower Leg Wound Laterality: Left, Medial Cleanser: Normal Saline (Home Health) (Generic) 2 x Per Day/30 Days Discharge Instructions: Cleanse the wound with Normal Saline prior to applying a clean dressing using gauze sponges, not tissue or cotton balls. Prim Dressing: Promogran Prisma Matrix, 4.34 (sq in) (silver collagen) (Home Health) (Generic) 2 x Per Day/30 Days ary Discharge Instructions: moisten with normal saline Secondary Dressing: Woven Gauze Sponge, Non-Sterile 4x4 in (Home Health) (Generic) 2 x Per Day/30 Days Discharge Instructions: Apply over primary dressing as directed. Secondary Dressing: ABD Pad, 5x9 (Home Health) 2 x Per Day/30 Days Discharge Instructions: Apply over primary dressing as directed. Com pression Wrap: Kerlix Roll 4.5x3.1 (in/yd) (Home Health) (Generic) 2 x Per Day/30 Days Discharge Instructions: Apply Kerlix and Coban compression as directed.from base of toes to patella knotch Com pression Wrap:  Coban Self-Adherent Wrap 4x5 (in/yd) (Home Health) (Generic) 2 x Per Day/30 Days Discharge Instructions: Apply over Kerlix as directed.from base of toes to patella knotch WOUND #8: - Calcaneus Wound Laterality: Left, Distal Cleanser: Normal Saline (Home Health) (Generic) 2 x Per Day/30 Days Discharge Instructions: Cleanse the wound with Normal Saline prior to applying a clean dressing using gauze sponges, not tissue or cotton balls. Prim Dressing: Promogran Prisma Matrix, 4.34 (sq in) (silver collagen) (Home Health) (Generic) 2 x Per Day/30 Days ary Discharge Instructions: moisten with normal saline Secondary Dressing: Woven Gauze Sponge, Non-Sterile 4x4 in (Home Health) (Generic) 2 x Per Day/30 Days Discharge Instructions: Apply over primary dressing as directed. Secondary Dressing: ABD Pad, 5x9 (Home Health) 2 x Per Day/30 Days Discharge Instructions: Apply over primary dressing as directed. Com pression Wrap: Kerlix Roll 4.5x3.1 (in/yd) (Home Health) (Generic) 2 x Per Day/30 Days Discharge Instructions: Apply Kerlix and Coban compression as directed. from base of toes to patella knotch Com pression Wrap: Coban Self-Adherent Wrap 4x5 (in/yd) (Home Health) (Generic) 2 x Per Day/30 Days Discharge Instructions: Apply over Kerlix as directed. from base of toes to patella knotch WOUND #9: - Calcaneus Wound Laterality: Right Cleanser: Normal Saline (Home Health) (Generic) 2 x Per Day/30 Days Discharge Instructions: Cleanse the wound with Normal Saline prior to applying a clean dressing using gauze sponges, not tissue or cotton balls. Prim Dressing: Promogran Prisma Matrix, 4.34 (sq in) (silver collagen) (Home Health) (Generic) 2 x Per Day/30 Days ary Discharge Instructions: moisten with normal saline Secondary Dressing: Woven Gauze Sponge, Non-Sterile 4x4 in [facility]) (Generic) 2 x  Per Day/30 Days Discharge Instructions: Apply over primary dressing as directed. Secondary Dressing: ABD Pad,  5x9 (Home Health) 2 x Per Day/30 Days Discharge Instructions: Apply over primary dressing as directed. Com pression Wrap: Kerlix Roll 4.5x3.1 (in/yd) (Home Health) (Generic) 2 x Per Day/30 Days Discharge Instructions: Apply Kerlix and Coban compression as directed.from base of toes to patella knotch Com pression Wrap: Coban Self-Adherent Wrap 4x5 (in/yd) (Home Health) (Generic) 2 x Per Day/30 Days Discharge Instructions: Apply over Kerlix as directed. from base of toes to patella knotch 1. Patient has an appointment with vascular surgery on 1/11 I am asking them to review her arterial status given the amount of pain she describes 2. I would like to continue to wrap the right leg kerlix Coban. 3. Still using silver collagen on the small punched-out area on the left. Noted debridement today. 4. I would like to see her back in 2 weeks to go over the vein and vascular appointment. If they feel she has enough circulation to heal things I will increase the compression wraps. She does have palpable pulses in the left dorsal foot and the posterior tibia Electronic Signature(s) Signed: 01/02/2021 4:50:25 PM By: Linton Ham MD Entered By: Linton Ham on 01/02/2021 13:45:38 -------------------------------------------------------------------------------- SuperBill Details Patient Name: Date of Service: Sonya Chambers. 01/02/2021 Medical Record Number: [id number] Patient Account Number: [id number] Date of Birth/Sex: Treating RN: June 01, 1941 (80 y.o. 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 L97.418 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 E11.621 Type 2 diabetes mellitus with foot ulcer Facility Procedures CPT4 Code: [id number] Description: [id number] - DEB SUBQ TISSUE 20 SQ CM/< ICD-10 Diagnosis Description L97.821 Non-pressure chronic ulcer of other part of left lower leg limited to breakdown Modifier: of skin Quantity: 1 Physician Procedures : CPT4 Code Description Modifier [id number] - WC PHYS SUBQ TISS 20 SQ CM ICD-10 Diagnosis Description L97.821 Non-pressure chronic ulcer of other part of left lower leg limited to breakdown of skin Quantity: 1 Electronic Signature(s) Signed: 01/02/2021 4:50:25 PM By: Linton Ham MD Entered By: Linton Ham on 01/02/2021 13:45:49

## 2021-01-03 NOTE — Progress Notes (Addendum)
Sonya, Goodwin ([id number]) Visit Report for 01/02/2021 Arrival Information Details Patient Name: Date of Service: [name], [name] 01/02/2021 12:30 PM Medical Record Number: [id number] Patient Account Number: [id number] Date of Birth/Sex: Treating RN: 04-06-1941 (80 y.o. Sonya Goodwin, Lauren Primary Care Sarita Hakanson: Nicholes Stairs Other Clinician: Referring Dynasti Kerman: Treating Sarinity Dicicco/Extender: Nyra Jabs in Treatment: 18 Visit Information History Since Last Visit Added or deleted any medications: No Patient Arrived: Wheel Chair Any new allergies or adverse reactions: No Arrival Time: 12:53 Had a fall or experienced change in No Accompanied By: husband activities of daily living that may affect Transfer Assistance: None risk of falls: Patient Identification Verified: Yes Signs or symptoms of abuse/neglect since last visito No Secondary Verification Process Completed: Yes Hospitalized since last visit: No Patient Requires Transmission-Based Precautions: No Implantable device outside of the clinic excluding No Patient Has Alerts: No cellular tissue based products placed in the center since last visit: Has Dressing in Place as Prescribed: Yes Pain Present Now: No Electronic Signature(s) Signed: 01/03/2021 11:11:03 AM By: Sandre Kitty Entered By: Sandre Kitty on 01/02/2021 12:53:30 -------------------------------------------------------------------------------- Complex / Palliative Patient Assessment Details Patient Name: Date of Service: Clemon Goodwin. 01/02/2021 12:30 PM Medical Record Number: [id number] Patient Account Number: [id number] Date of Birth/Sex: Treating RN: 11-01-1941 (80 y.o. Sonya Goodwin Primary Care Quetzal Meany: Nicholes Stairs Other Clinician: Referring Marlis Oldaker: Treating Terresa Marlett/Extender: Nyra Jabs in Treatment: 18 Palliative Management Criteria Complex Wound Management  Criteria Patient has remarkable or complex co-morbidities requiring medications or treatments that extend wound healing times. Examples: Diabetes mellitus with chronic renal failure or end stage renal disease requiring dialysis Advanced or poorly controlled rheumatoid arthritis Diabetes mellitus and end stage chronic obstructive pulmonary disease Active cancer with current chemo- or radiation therapy Type 2 Diabetes, PVD, Adrenal Insufficiency, CKD, Sarcoidosis Care Approach Wound Care Plan: Complex Wound Management Electronic Signature(s) Signed: 01/05/2021 4:48:18 PM By: Linton Ham MD Signed: 01/08/2021 5:04:19 PM By: Levan Hurst RN, BSN Entered By: Levan Hurst on 01/05/2021 12:07:28 -------------------------------------------------------------------------------- Encounter Discharge Information Details Patient Name: Date of Service: Sonya Raider S. 01/02/2021 12:30 PM Medical Record Number: [id number] Patient Account Number: [id number] Date of Birth/Sex: Treating RN: 05-14-1941 (80 y.o. Sonya Goodwin Primary Care Bao Bazen: Nicholes Stairs Other Clinician: Referring Tyray Proch: Treating Renika Shiflet/Extender: Nyra Jabs in Treatment: 18 Encounter Discharge Information Items Post Procedure Vitals Discharge Condition: Stable Temperature (F): 98.2 Ambulatory Status: Wheelchair Pulse (bpm): 123 Discharge Destination: Home Respiratory Rate (breaths/min): 18 Transportation: Private Auto Blood Pressure (mmHg): 132/81 Accompanied By: husband Schedule Follow-up Appointment: Yes Clinical Summary of Care: Patient Declined Electronic Signature(s) Signed: 01/02/2021 5:50:13 PM By: Levan Hurst RN, BSN Entered By: Levan Hurst on 01/02/2021 14:33:52 -------------------------------------------------------------------------------- Multi Wound Chart Details Patient Name: Date of Service: Sonya Raider S. 01/02/2021 12:30 PM Medical Record  Number: [id number] Patient Account Number: [id number] Date of Birth/Sex: Treating RN: 08/23/1941 (80 y.o. Sonya Goodwin, Lauren Primary Care Alysa Duca: Nicholes Stairs Other Clinician: Referring Sagan Maselli: Treating Torrion Witter/Extender: Nyra Jabs in Treatment: 18 Vital Signs Height(in): 66 Pulse(bpm): 123 Weight(lbs): 141 Blood Pressure(mmHg): 132/81 Body Mass Index(BMI): 23 Temperature(F): 98.5 Respiratory Rate(breaths/min): 18 Photos: [1:No Photos Left, Anterior Lower Leg] [2:No Photos Left, Lateral Lower Leg] [3:No Photos Right, Anterior Lower Leg] Wound Location: [1:Gradually Appeared] [2:Gradually Appeared] [3:Trauma] Wounding Event: [1:Venous Leg Ulcer] [2:Diabetic Wound/Ulcer of the Lower] [3:Skin Tear] Primary Etiology: [1:N/A] [2:Extremity Venous Leg Ulcer] [3:Venous Leg Ulcer] Secondary  Etiology: [1:Cataracts, Glaucoma, Hypertension, Cataracts, Glaucoma, Hypertension,] [3:N/A] Comorbid History: [1:Type II Diabetes, Osteoarthritis, Type II Diabetes, Osteoarthritis, Neuropathy 06/15/2020] [2:Neuropathy 06/15/2020] [3:09/24/2020] Date Acquired: [1:18] [2:18] [3:14] Weeks of Treatment: [1:Open] [2:Open] [3:Open] Wound Status: [1:No] [2:No] [3:No] Clustered Wound: [1:0.9x0.9x0.2] [2:1x0.6x0.1] [3:0x0x0] Measurements L x W x D (cm) [1:0.636] [2:0.471] [3:0] A (cm) : rea [1:0.127] [2:0.047] [3:0] Volume (cm) : [1:55.00%] [2:38.80%] [3:N/A] % Reduction in Area: [1:9.90%] [2:39.00%] [3:N/A] % Reduction in Volume: [1:Full Thickness Without Exposed] [2:Grade 1] [3:Full Thickness Without Exposed] Classification: [1:Support Structures Small] [2:Medium] [3:Support Structures N/A] Exudate Amount: [1:Serosanguineous] [2:Serosanguineous] [3:N/A] Exudate Type: [1:red, brown] [2:red, brown] [3:N/A] Exudate Color: [1:Flat and Intact] [2:N/A] [3:N/A] Wound Margin: [1:Large (67-100%)] [2:Medium (34-66%)] [3:N/A] Granulation Amount: [1:Red] [2:Red, Pink]  [3:N/A] Granulation Quality: [1:Small (1-33%)] [2:Medium (34-66%)] [3:N/A] Necrotic Amount: [1:Fat Layer (Subcutaneous Tissue): Yes Fascia: No] [3:N/A] Exposed Structures: [1:Fascia: No Tendon: No Muscle: No Joint: No Bone: No Small (1-33%)] [2:Fat Layer (Subcutaneous Tissue): No Tendon: No Muscle: No Joint: No Bone: No Small (1-33%)] [3:N/A] Wound Number: 5 6 7  Photos: No Photos No Photos No Photos Right, Lateral Lower Leg Left, Medial Lower Leg Right, Proximal Calcaneus Wound Location: Trauma Gradually Appeared Gradually Appeared Wounding Event: Skin Tear Venous Leg Ulcer Diabetic Wound/Ulcer of the Lower Primary Etiology: Extremity N/A N/A N/A Secondary Etiology: N/A N/A N/A Comorbid History: 10/03/2020 11/07/2020 12/05/2020 Date Acquired: 12 8 4  Weeks of Treatment: Open Open Open Wound Status: No No No Clustered Wound: 0.5x0.5x0.1 0.9x0.6x0.1 0x0x0 Measurements L x W x D (cm) 0.196 0.424 0 A (cm) : rea 0.02 0.042 0 Volume (cm) : 68.80% 44.90% 100.00% % Reduction in Area: 68.30% 72.70% 100.00% % Reduction in Volume: Full Thickness Without Exposed Full Thickness Without Exposed Unable to visualize wound bed Classification: Support Structures Support Structures N/A N/A N/A Exudate A mount: N/A N/A N/A Exudate Type: N/A N/A N/A Exudate Color: N/A N/A N/A Wound Margin: N/A N/A N/A Granulation A mount: N/A N/A N/A Granulation Quality: N/A N/A N/A Necrotic A mount: N/A N/A N/A Exposed Structures: N/A N/A N/A Epithelialization: Wound Number: 8 9 N/A Photos: No Photos No Photos N/A Left, Distal Calcaneus Right Calcaneus N/A Wound Location: Gradually Appeared Gradually Appeared N/A Wounding Event: Diabetic Wound/Ulcer of the Lower Diabetic Wound/Ulcer of the Lower N/A Primary Etiology: Extremity Extremity N/A N/A N/A Secondary Etiology: N/A N/A N/A Comorbid History: 12/05/2020 12/05/2020 N/A Date Acquired: 4 4 N/A Weeks of Treatment: Open Open  N/A Wound Status: No Yes N/A Clustered Wound: 0.3x1.3x0.1 0.2x0.6x0.1 N/A Measurements L x W x D (cm) 0.306 0.094 N/A A (cm) : rea 0.031 0.009 N/A Volume (cm) : 51.30% 80.00% N/A % Reduction in A rea: 50.80% 80.90% N/A % Reduction in Volume: Unable to visualize wound bed Grade 2 N/A Classification: N/A N/A N/A Exudate A mount: N/A N/A N/A Exudate Type: N/A N/A N/A Exudate Color: N/A N/A N/A Wound Margin: N/A N/A N/A Granulation A mount: N/A N/A N/A Granulation Quality: N/A N/A N/A Necrotic A mount: N/A N/A N/A Exposed Structures: N/A N/A N/A Epithelialization: Treatment Notes Electronic Signature(s) Signed: 01/02/2021 4:50:25 PM By: Linton Ham MD Signed: 01/02/2021 5:38:12 PM By: Rhae Hammock RN Entered By: Linton Ham on 01/02/2021 13:36:19 -------------------------------------------------------------------------------- Multi-Disciplinary Care Plan Details Patient Name: Date of Service: [place], [place]. 01/02/2021 12:30 PM Medical Record Number: [id number] Patient Account Number: [id number] Date of Birth/Sex: Treating RN: 02-12-1941 (80 y.o. Sonya Goodwin, Lauren Primary Care Rockland Kotarski: Nicholes Stairs Other Clinician: Referring Joanathan Affeldt: Treating Sabrie Moritz/Extender: Tressa Busman  L Weeks in Treatment: 18 Active Inactive Wound/Skin Impairment Nursing Diagnoses: Knowledge deficit related to ulceration/compromised skin integrity Goals: Patient/caregiver will verbalize understanding of skin care regimen Date Initiated: 08/29/2020 Target Resolution Date: 12/08/2020 Goal Status: Active Ulcer/skin breakdown will have a volume reduction of 30% by week 4 Date Initiated: 08/29/2020 Date Inactivated: 09/26/2020 Target Resolution Date: 09/28/2020 Goal Status: Unmet Unmet Reason: comorbities Ulcer/skin breakdown will have a volume reduction of 50% by week 8 Date Initiated: 09/26/2020 Date Inactivated: 11/07/2020 Target Resolution Date:  10/28/2020 Goal Status: Unmet Unmet Reason: COMORBITIES Interventions: Assess patient/caregiver ability to obtain necessary supplies Assess patient/caregiver ability to perform ulcer/skin care regimen upon admission and as needed Assess ulceration(s) every visit Notes: Electronic Signature(s) Signed: 01/02/2021 5:38:12 PM By: Rhae Hammock RN Entered By: Rhae Hammock on 01/02/2021 13:40:02 -------------------------------------------------------------------------------- Pain Assessment Details Patient Name: Date of Service: Clemon Goodwin. 01/02/2021 12:30 PM Medical Record Number: [id number] Patient Account Number: [id number] Date of Birth/Sex: Treating RN: 1941-03-26 (80 y.o. Sonya Goodwin, Lauren Primary Care Hertha Gergen: Nicholes Stairs Other Clinician: Referring Willena Jeancharles: Treating Ennio Houp/Extender: Nyra Jabs in Treatment: 18 Active Problems Location of Pain Severity and Description of Pain Patient Has Paino No Site Locations Pain Management and Medication Current Pain Management: Electronic Signature(s) Signed: 01/02/2021 5:38:12 PM By: Rhae Hammock RN Signed: 01/03/2021 11:11:03 AM By: Sandre Kitty Entered By: Sandre Kitty on 01/02/2021 12:54:05 -------------------------------------------------------------------------------- Patient/Caregiver Education Details Patient Name: Date of Service: Clemon Goodwin 1/4/2022andnbsp12:30 PM Medical Record Number: [id number] Patient Account Number: [id number] Date of Birth/Gender: Treating RN: 02-May-1941 (80 y.o. Sonya Goodwin, Lauren Primary Care Physician: Nicholes Stairs Other Clinician: Referring Physician: Treating Physician/Extender: Nyra Jabs in Treatment: 18 Education Assessment Education Provided To: Patient and Caregiver Education Topics Provided Basic Hygiene: Methods: Explain/Verbal Responses: State content correctly Wound/Skin  Impairment: Handouts: Caring for Your Ulcer Methods: Explain/Verbal Responses: State content correctly Electronic Signature(s) Signed: 01/02/2021 5:38:12 PM By: Rhae Hammock RN Entered By: Rhae Hammock on 01/02/2021 13:40:20 -------------------------------------------------------------------------------- Wound Assessment Details Patient Name: Date of Service: Clemon Goodwin. 01/02/2021 12:30 PM Medical Record Number: [id number] Patient Account Number: [id number] Date of Birth/Sex: Treating RN: 1941-09-29 (80 y.o. Sonya Goodwin, Lauren Primary Care Laquida Cotrell: Nicholes Stairs Other Clinician: Referring Larua Collier: Treating Dacy Enrico/Extender: Nyra Jabs in Treatment: 18 Wound Status Wound Number: 1 Primary Venous Leg Ulcer Etiology: Wound Location: Left, Anterior Lower Leg Wound Open Wounding Event: Gradually Appeared Status: Date Acquired: 06/15/2020 Comorbid Cataracts, Glaucoma, Hypertension, Type II Diabetes, Weeks Of Treatment: 18 History: Osteoarthritis, Neuropathy Clustered Wound: No Photos Photo Uploaded By: Mikeal Hawthorne on 01/04/2021 13:38:23 Wound Measurements Length: (cm) 0.9 Width: (cm) 0.9 Depth: (cm) 0.2 Area: (cm) 0.636 Volume: (cm) 0.127 % Reduction in Area: 55% % Reduction in Volume: 9.9% Epithelialization: Small (1-33%) Tunneling: No Undermining: No Wound Description Classification: Full Thickness Without Exposed Support Structures Wound Margin: Flat and Intact Exudate Amount: Small Exudate Type: Serosanguineous Exudate Color: red, brown Foul Odor After Cleansing: No Slough/Fibrino Yes Wound Bed Granulation Amount: Large (67-100%) Exposed Structure Granulation Quality: Red Fascia Exposed: No Necrotic Amount: Small (1-33%) Fat Layer (Subcutaneous Tissue) Exposed: Yes Necrotic Quality: Adherent Slough Tendon Exposed: No Muscle Exposed: No Joint Exposed: No Bone Exposed: No Treatment Notes Wound #1  (Lower Leg) Wound Laterality: Left, Anterior Cleanser Normal Saline Discharge Instruction: Cleanse the wound with Normal Saline prior to applying a clean dressing using gauze sponges, not tissue or cotton balls. Peri-Wound Care Topical Primary Dressing Promogran  Prisma Matrix, 4.34 (sq in) (silver collagen) Discharge Instruction: moisten with normal saline Secondary Dressing Woven Gauze Sponge, Non-Sterile 4x4 in Discharge Instruction: Apply over primary dressing as directed. ABD Pad, 5x9 Discharge Instruction: Apply over primary dressing as directed. Secured With Compression Wrap Kerlix Roll 4.5x3.1 (in/yd) Discharge Instruction: Apply Kerlix and Coban compression as directed lightly . from base of toes to patella knotch Coban Self-Adherent Wrap 4x5 (in/yd) Discharge Instruction: Apply over Kerlix as directed.from base of toes to patella knotch Compression Stockings Add-Ons Electronic Signature(s) Signed: 01/02/2021 5:38:12 PM By: Fonnie Mu RN Entered By: Fonnie Mu on 01/02/2021 13:21:56 -------------------------------------------------------------------------------- Wound Assessment Details Patient Name: Date of Service: Novella Rob. 01/02/2021 12:30 PM Medical Record Number: [id number] Patient Account Number: [id number] Date of Birth/Sex: Treating RN: 1941-02-10 (80 y.o. Ardis Rowan, Lauren Primary Care Kasara Schomer: Kathleen Argue Other Clinician: Referring Alia Parsley: Treating Jorma Tassinari/Extender: Dennison Bulla in Treatment: 18 Wound Status Wound Number: 2 Primary Diabetic Wound/Ulcer of the Lower Extremity Etiology: Wound Location: Left, Lateral Lower Leg Secondary Venous Leg Ulcer Wounding Event: Gradually Appeared Etiology: Date Acquired: 06/15/2020 Wound Status: Open Weeks Of Treatment: 18 Comorbid Cataracts, Glaucoma, Hypertension, Type II Diabetes, Clustered Wound: No History: Osteoarthritis, Neuropathy Photos Photo  Uploaded By: Benjaman Kindler on 01/04/2021 13:47:59 Wound Measurements Length: (cm) 1 Width: (cm) 0.6 Depth: (cm) 0.1 Area: (cm) 0.471 Volume: (cm) 0.047 % Reduction in Area: 38.8% % Reduction in Volume: 39% Epithelialization: Small (1-33%) Tunneling: No Undermining: No Wound Description Classification: Grade 1 Exudate Amount: Medium Exudate Type: Serosanguineous Exudate Color: red, brown Foul Odor After Cleansing: No Slough/Fibrino Yes Wound Bed Granulation Amount: Medium (34-66%) Exposed Structure Granulation Quality: Red, Pink Fascia Exposed: No Necrotic Amount: Medium (34-66%) Fat Layer (Subcutaneous Tissue) Exposed: No Necrotic Quality: Adherent Slough Tendon Exposed: No Muscle Exposed: No Joint Exposed: No Bone Exposed: No Treatment Notes Wound #2 (Lower Leg) Wound Laterality: Left, Lateral Cleanser Normal Saline Discharge Instruction: Cleanse the wound with Normal Saline prior to applying a clean dressing using gauze sponges, not tissue or cotton balls. Peri-Wound Care Topical Primary Dressing Promogran Prisma Matrix, 4.34 (sq in) (silver collagen) Discharge Instruction: moisten with normal saline Secondary Dressing Woven Gauze Sponge, Non-Sterile 4x4 in Discharge Instruction: Apply over primary dressing as directed. ABD Pad, 5x9 Discharge Instruction: Apply over primary dressing as directed. Secured With Compression Wrap Kerlix Roll 4.5x3.1 (in/yd) Discharge Instruction: Apply Kerlix and Coban compression as directed.from base of toes to patella knotch Coban Self-Adherent Wrap 4x5 (in/yd) Discharge Instruction: Apply over Kerlix as directed.from base of toes to patella knotch Compression Stockings Add-Ons Electronic Signature(s) Signed: 01/02/2021 5:38:12 PM By: Fonnie Mu RN Entered By: Fonnie Mu on 01/02/2021 13:22:21 -------------------------------------------------------------------------------- Wound Assessment Details Patient  Name: Date of Service: Aurea Graff S. 01/02/2021 12:30 PM Medical Record Number: [id number] Patient Account Number: [id number] Date of Birth/Sex: Treating RN: [birth year] (80 y.o. Ardis Rowan, Lauren Primary Care Billyjoe Go: Kathleen Argue Other Clinician: Referring Khaleed Holan: Treating Marbeth Smedley/Extender: Dennison Bulla in Treatment: 18 Wound Status Wound Number: 3 Primary Etiology: Skin Tear Wound Location: Right, Anterior Lower Leg Secondary Etiology: Venous Leg Ulcer Wounding Event: Trauma Wound Status: Open Date Acquired: 09/24/2020 Weeks Of Treatment: 14 Clustered Wound: No Photos Photo Uploaded By: Benjaman Kindler on 01/04/2021 13:34:16 Wound Measurements Length: (cm) Width: (cm) Depth: (cm) Area: (cm) Volume: (cm) 0 % Reduction in Area: 0 % Reduction in Volume: 0 0 0 Wound Description Classification: Full Thickness Without Exposed Support Structur es Psychologist, prison and probation services) Signed:  01/02/2021 5:38:12 PM By: Rhae Hammock RN Signed: 01/03/2021 11:11:03 AM By: Sandre Kitty Entered By: Sandre Kitty on 01/02/2021 13:12:52 -------------------------------------------------------------------------------- Wound Assessment Details Patient Name: Date of Service: Clemon Goodwin. 01/02/2021 12:30 PM Medical Record Number: [id number] Patient Account Number: [id number] Date of Birth/Sex: Treating RN: 1941-06-21 (80 y.o. Sonya Goodwin, Lauren Primary Care Marsi Turvey: Nicholes Stairs Other Clinician: Referring Dublin Cantero: Treating Velvia Mehrer/Extender: Nyra Jabs in Treatment: 18 Wound Status Wound Number: 5 Primary Skin Tear Etiology: Wound Location: Right, Lateral Lower Leg Wound Open Wounding Event: Trauma Status: Date Acquired: 10/03/2020 Comorbid Cataracts, Glaucoma, Hypertension, Type II Diabetes, Weeks Of Treatment: 12 History: Osteoarthritis, Neuropathy Clustered Wound: No Photos Photo Uploaded  By: Mikeal Hawthorne on 01/04/2021 13:34:17 Wound Measurements Length: (cm) 0.5 Width: (cm) 0.5 Depth: (cm) 0.1 Area: (cm) 0.196 Volume: (cm) 0.02 % Reduction in Area: 68.8% % Reduction in Volume: 68.3% Epithelialization: Medium (34-66%) Tunneling: No Undermining: No Wound Description Classification: Full Thickness Without Exposed Support Structures Wound Margin: Flat and Intact Exudate Amount: Small Exudate Type: Serosanguineous Exudate Color: red, brown Foul Odor After Cleansing: No Slough/Fibrino No Wound Bed Granulation Amount: Medium (34-66%) Exposed Structure Granulation Quality: Red Fascia Exposed: No Necrotic Amount: Medium (34-66%) Fat Layer (Subcutaneous Tissue) Exposed: Yes Necrotic Quality: Adherent Slough Tendon Exposed: No Muscle Exposed: No Joint Exposed: No Bone Exposed: No Treatment Notes Wound #5 (Lower Leg) Wound Laterality: Right, Lateral Cleanser Normal Saline Discharge Instruction: Cleanse the wound with Normal Saline prior to applying a clean dressing using gauze sponges, not tissue or cotton balls. Peri-Wound Care Topical Primary Dressing Promogran Prisma Matrix, 4.34 (sq in) (silver collagen) Discharge Instruction: moisten with normal saline Secondary Dressing Woven Gauze Sponge, Non-Sterile 4x4 in Discharge Instruction: Apply over primary dressing as directed. ABD Pad, 5x9 Discharge Instruction: Apply over primary dressing as directed. Secured With Compression Wrap Kerlix Roll 4.5x3.1 (in/yd) Discharge Instruction: Apply Kerlix and Coban compression as directed. from base of toes to patella knotch Coban Self-Adherent Wrap 4x5 (in/yd) Discharge Instruction: Apply over Kerlix as directed. from base of toes to patella knotch Compression Stockings Add-Ons Electronic Signature(s) Signed: 01/02/2021 5:38:12 PM By: Rhae Hammock RN Entered By: Rhae Hammock on 01/02/2021  13:37:55 -------------------------------------------------------------------------------- Wound Assessment Details Patient Name: Date of Service: Clemon Goodwin. 01/02/2021 12:30 PM Medical Record Number: [id number] Patient Account Number: [id number] Date of Birth/Sex: Treating RN: [birth year] (80 y.o. Sonya Goodwin, Lauren Primary Care Una Yeomans: Nicholes Stairs Other Clinician: Referring Marti Acebo: Treating Nery Frappier/Extender: Nyra Jabs in Treatment: 18 Wound Status Wound Number: 6 Primary Etiology: Venous Leg Ulcer Wound Location: Left, Medial Lower Leg Wound Status: Open Wounding Event: Gradually Appeared Date Acquired: 11/07/2020 Weeks Of Treatment: 8 Clustered Wound: No Photos Photo Uploaded By: Mikeal Hawthorne on 01/04/2021 13:38:24 Wound Measurements Length: (cm) 0.9 Width: (cm) 0.6 Depth: (cm) 0.1 Area: (cm) 0.424 Volume: (cm) 0.042 % Reduction in Area: 44.9% % Reduction in Volume: 72.7% Wound Description Classification: Full Thickness Without Exposed Support Structur es Treatment Notes Wound #6 (Lower Leg) Wound Laterality: Left, Medial Cleanser Normal Saline Discharge Instruction: Cleanse the wound with Normal Saline prior to applying a clean dressing using gauze sponges, not tissue or cotton balls. Peri-Wound Care Topical Primary Dressing Promogran Prisma Matrix, 4.34 (sq in) (silver collagen) Discharge Instruction: moisten with normal saline Secondary Dressing Woven Gauze Sponge, Non-Sterile 4x4 in Discharge Instruction: Apply over primary dressing as directed. ABD Pad, 5x9 Discharge Instruction: Apply over primary dressing as directed. Secured With Compression Wrap Kerlix Roll  4.5x3.1 (in/yd) Discharge Instruction: Apply Kerlix and Coban compression as directed.from base of toes to patella knotch Coban Self-Adherent Wrap 4x5 (in/yd) Discharge Instruction: Apply over Kerlix as directed.from base of toes to patella  knotch Compression Stockings Add-Ons Electronic Signature(s) Signed: 01/02/2021 5:38:12 PM By: Rhae Hammock RN Signed: 01/03/2021 11:11:03 AM By: Sandre Kitty Entered By: Sandre Kitty on 01/02/2021 13:12:53 -------------------------------------------------------------------------------- Wound Assessment Details Patient Name: Date of Service: Clemon Goodwin. 01/02/2021 12:30 PM Medical Record Number: [id number] Patient Account Number: [id number] Date of Birth/Sex: Treating RN: 1941-06-13 (80 y.o. Sonya Goodwin, Lauren Primary Care Janes Colegrove: Nicholes Stairs Other Clinician: Referring Jamelah Sitzer: Treating Cornelius Schuitema/Extender: Nyra Jabs in Treatment: 18 Wound Status Wound Number: 7 Primary Etiology: Diabetic Wound/Ulcer of the Lower Extremity Wound Location: Right, Proximal Calcaneus Wound Status: Open Wounding Event: Gradually Appeared Date Acquired: 12/05/2020 Weeks Of Treatment: 4 Clustered Wound: No Wound Measurements Length: (cm) Width: (cm) Depth: (cm) Area: (cm) Volume: (cm) 0 % Reduction in Area: 100% 0 % Reduction in Volume: 100% 0 0 0 Wound Description Classification: Unable to visualize wound bed Electronic Signature(s) Signed: 01/02/2021 5:38:12 PM By: Rhae Hammock RN Signed: 01/03/2021 11:11:03 AM By: Sandre Kitty Entered By: Sandre Kitty on 01/02/2021 13:12:53 -------------------------------------------------------------------------------- Wound Assessment Details Patient Name: Date of Service: Clemon Goodwin. 01/02/2021 12:30 PM Medical Record Number: [id number] Patient Account Number: [id number] Date of Birth/Sex: Treating RN: [birth year] 1941 (80 y.o. Sonya Goodwin, Lauren Primary Care Jerime Arif: Nicholes Stairs Other Clinician: Referring Ember Gottwald: Treating Eman Morimoto/Extender: Nyra Jabs in Treatment: 18 Wound Status Wound Number: 8 Primary Diabetic Wound/Ulcer of the Lower  Extremity Etiology: Wound Location: Left, Distal Calcaneus Wound Open Wounding Event: Gradually Appeared Status: Date Acquired: 12/05/2020 Comorbid Cataracts, Glaucoma, Hypertension, Type II Diabetes, Weeks Of Treatment: 4 History: Osteoarthritis, Neuropathy Clustered Wound: No Photos Photo Uploaded By: Mikeal Hawthorne on 01/04/2021 13:47:59 Wound Measurements Length: (cm) 0.3 Width: (cm) 1.3 Depth: (cm) 0.1 Area: (cm) 0.306 Volume: (cm) 0.031 % Reduction in Area: 51.3% % Reduction in Volume: 50.8% Epithelialization: Small (1-33%) Tunneling: No Undermining: No Wound Description Classification: Unable to visualize wound bed Wound Margin: Distinct, outline attached Exudate Amount: Medium Exudate Type: Serosanguineous Exudate Color: red, brown Foul Odor After Cleansing: No Slough/Fibrino Yes Wound Bed Granulation Amount: Small (1-33%) Exposed Structure Granulation Quality: Red Fascia Exposed: No Necrotic Amount: Large (67-100%) Fat Layer (Subcutaneous Tissue) Exposed: Yes Necrotic Quality: Adherent Slough Tendon Exposed: No Muscle Exposed: No Joint Exposed: No Bone Exposed: No Treatment Notes Wound #8 (Calcaneus) Wound Laterality: Left, Distal Cleanser Normal Saline Discharge Instruction: Cleanse the wound with Normal Saline prior to applying a clean dressing using gauze sponges, not tissue or cotton balls. Peri-Wound Care Topical Primary Dressing Promogran Prisma Matrix, 4.34 (sq in) (silver collagen) Discharge Instruction: moisten with normal saline Secondary Dressing Woven Gauze Sponge, Non-Sterile 4x4 in Discharge Instruction: Apply over primary dressing as directed. ABD Pad, 5x9 Discharge Instruction: Apply over primary dressing as directed. Secured With Compression Wrap Kerlix Roll 4.5x3.1 (in/yd) Discharge Instruction: Apply Kerlix and Coban compression as directed. from base of toes to patella knotch Coban Self-Adherent Wrap 4x5 (in/yd) Discharge  Instruction: Apply over Kerlix as directed. from base of toes to patella knotch Compression Stockings Add-Ons Electronic Signature(s) Signed: 01/02/2021 5:38:12 PM By: Rhae Hammock RN Entered By: Rhae Hammock on 01/02/2021 13:37:23 -------------------------------------------------------------------------------- Wound Assessment Details Patient Name: Date of Service: Clemon Goodwin. 01/02/2021 12:30 PM Medical Record Number: [id number] Patient Account Number: [id number] Date of Birth/Sex: Treating  RN: 03-03-1941 (80 y.o. Sonya Goodwin, Lauren Primary Care Etter Royall: Nicholes Stairs Other Clinician: Referring Donnella Morford: Treating Mersades Barbaro/Extender: Nyra Jabs in Treatment: 18 Wound Status Wound Number: 9 Primary Diabetic Wound/Ulcer of the Lower Extremity Etiology: Wound Location: Right Calcaneus Wound Open Wounding Event: Gradually Appeared Status: Date Acquired: 12/05/2020 Comorbid Cataracts, Glaucoma, Hypertension, Type II Diabetes, Weeks Of Treatment: 4 History: Osteoarthritis, Neuropathy Clustered Wound: Yes Photos Photo Uploaded By: Mikeal Hawthorne on 01/04/2021 13:33:47 Wound Measurements Length: (cm) 0.2 Width: (cm) 0.6 Depth: (cm) 0.1 Clustered Quantity: 2 Area: (cm) 0.094 Volume: (cm) 0.009 % Reduction in Area: 80% % Reduction in Volume: 80.9% Epithelialization: Small (1-33%) Tunneling: No Undermining: No Wound Description Classification: Grade 2 Wound Margin: Distinct, outline attached Exudate Amount: Medium Exudate Type: Serosanguineous Exudate Color: red, brown Foul Odor After Cleansing: No Slough/Fibrino No Wound Bed Granulation Amount: Large (67-100%) Exposed Structure Granulation Quality: Red Fascia Exposed: No Necrotic Amount: None Present (0%) Fat Layer (Subcutaneous Tissue) Exposed: Yes Tendon Exposed: No Muscle Exposed: No Joint Exposed: No Bone Exposed: No Treatment Notes Wound #9 (Calcaneus) Wound  Laterality: Right Cleanser Normal Saline Discharge Instruction: Cleanse the wound with Normal Saline prior to applying a clean dressing using gauze sponges, not tissue or cotton balls. Peri-Wound Care Topical Primary Dressing Promogran Prisma Matrix, 4.34 (sq in) (silver collagen) Discharge Instruction: moisten with normal saline Secondary Dressing Woven Gauze Sponge, Non-Sterile 4x4 in Discharge Instruction: Apply over primary dressing as directed. ABD Pad, 5x9 Discharge Instruction: Apply over primary dressing as directed. Secured With Compression Wrap Kerlix Roll 4.5x3.1 (in/yd) Discharge Instruction: Apply Kerlix and Coban compression as directed.from base of toes to patella knotch Coban Self-Adherent Wrap 4x5 (in/yd) Discharge Instruction: Apply over Kerlix as directed. from base of toes to patella knotch Compression Stockings Add-Ons Electronic Signature(s) Signed: 01/02/2021 5:38:12 PM By: Rhae Hammock RN Entered By: Rhae Hammock on 01/02/2021 13:38:06 -------------------------------------------------------------------------------- Vitals Details Patient Name: Date of Service: [name], [name] 01/02/2021 12:30 PM Medical Record Number: [id number] Patient Account Number: [id number] Date of Birth/Sex: Treating RN: [birth year] (80 y.o. Sonya Goodwin, Lauren Primary Care Daniyla Pfahler: Nicholes Stairs Other Clinician: Referring Dabid Godown: Treating Holli Rengel/Extender: Nyra Jabs in Treatment: 18 Vital Signs Time Taken: 12:53 Temperature (F): 98.5 Height (in): 66 Pulse (bpm): 123 Weight (lbs): 141 Respiratory Rate (breaths/min): 18 Body Mass Index (BMI): 22.8 Blood Pressure (mmHg): 132/81 Reference Range: 80 - 120 mg / dl Electronic Signature(s) Signed: 01/03/2021 11:11:03 AM By: Sandre Kitty Entered By: Sandre Kitty on 01/02/2021 12:53:50

## 2021-01-04 DIAGNOSIS — I129 Hypertensive chronic kidney disease with stage 1 through stage 4 chronic kidney disease, or unspecified chronic kidney disease: Secondary | ICD-10-CM | POA: Diagnosis not present

## 2021-01-04 DIAGNOSIS — N1831 Chronic kidney disease, stage 3a: Secondary | ICD-10-CM | POA: Diagnosis not present

## 2021-01-04 DIAGNOSIS — E1143 Type 2 diabetes mellitus with diabetic autonomic (poly)neuropathy: Secondary | ICD-10-CM | POA: Diagnosis not present

## 2021-01-04 DIAGNOSIS — M1710 Unilateral primary osteoarthritis, unspecified knee: Secondary | ICD-10-CM | POA: Diagnosis not present

## 2021-01-04 DIAGNOSIS — E1122 Type 2 diabetes mellitus with diabetic chronic kidney disease: Secondary | ICD-10-CM | POA: Diagnosis not present

## 2021-01-04 DIAGNOSIS — D631 Anemia in chronic kidney disease: Secondary | ICD-10-CM | POA: Diagnosis not present

## 2021-01-04 DIAGNOSIS — L97822 Non-pressure chronic ulcer of other part of left lower leg with fat layer exposed: Secondary | ICD-10-CM | POA: Diagnosis not present

## 2021-01-04 DIAGNOSIS — I872 Venous insufficiency (chronic) (peripheral): Secondary | ICD-10-CM | POA: Diagnosis not present

## 2021-01-04 DIAGNOSIS — L97812 Non-pressure chronic ulcer of other part of right lower leg with fat layer exposed: Secondary | ICD-10-CM | POA: Diagnosis not present

## 2021-01-04 DIAGNOSIS — E1142 Type 2 diabetes mellitus with diabetic polyneuropathy: Secondary | ICD-10-CM | POA: Diagnosis not present

## 2021-01-08 DIAGNOSIS — L97822 Non-pressure chronic ulcer of other part of left lower leg with fat layer exposed: Secondary | ICD-10-CM | POA: Diagnosis not present

## 2021-01-08 DIAGNOSIS — E1142 Type 2 diabetes mellitus with diabetic polyneuropathy: Secondary | ICD-10-CM | POA: Diagnosis not present

## 2021-01-08 DIAGNOSIS — I872 Venous insufficiency (chronic) (peripheral): Secondary | ICD-10-CM | POA: Diagnosis not present

## 2021-01-08 DIAGNOSIS — E1122 Type 2 diabetes mellitus with diabetic chronic kidney disease: Secondary | ICD-10-CM | POA: Diagnosis not present

## 2021-01-08 DIAGNOSIS — M1710 Unilateral primary osteoarthritis, unspecified knee: Secondary | ICD-10-CM | POA: Diagnosis not present

## 2021-01-08 DIAGNOSIS — I129 Hypertensive chronic kidney disease with stage 1 through stage 4 chronic kidney disease, or unspecified chronic kidney disease: Secondary | ICD-10-CM | POA: Diagnosis not present

## 2021-01-08 DIAGNOSIS — L97812 Non-pressure chronic ulcer of other part of right lower leg with fat layer exposed: Secondary | ICD-10-CM | POA: Diagnosis not present

## 2021-01-08 DIAGNOSIS — N1831 Chronic kidney disease, stage 3a: Secondary | ICD-10-CM | POA: Diagnosis not present

## 2021-01-08 DIAGNOSIS — D631 Anemia in chronic kidney disease: Secondary | ICD-10-CM | POA: Diagnosis not present

## 2021-01-08 DIAGNOSIS — E1143 Type 2 diabetes mellitus with diabetic autonomic (poly)neuropathy: Secondary | ICD-10-CM | POA: Diagnosis not present

## 2021-01-09 ENCOUNTER — Encounter: Payer: Medicare HMO | Admitting: Vascular Surgery

## 2021-01-09 DIAGNOSIS — S91102A Unspecified open wound of left great toe without damage to nail, initial encounter: Secondary | ICD-10-CM | POA: Diagnosis not present

## 2021-01-10 DIAGNOSIS — E1151 Type 2 diabetes mellitus with diabetic peripheral angiopathy without gangrene: Secondary | ICD-10-CM | POA: Diagnosis not present

## 2021-01-10 DIAGNOSIS — E1142 Type 2 diabetes mellitus with diabetic polyneuropathy: Secondary | ICD-10-CM | POA: Diagnosis not present

## 2021-01-10 DIAGNOSIS — G8929 Other chronic pain: Secondary | ICD-10-CM | POA: Diagnosis not present

## 2021-01-10 DIAGNOSIS — E11622 Type 2 diabetes mellitus with other skin ulcer: Secondary | ICD-10-CM | POA: Diagnosis not present

## 2021-01-10 DIAGNOSIS — G2581 Restless legs syndrome: Secondary | ICD-10-CM | POA: Diagnosis not present

## 2021-01-10 DIAGNOSIS — M0550 Rheumatoid polyneuropathy with rheumatoid arthritis of unspecified site: Secondary | ICD-10-CM | POA: Diagnosis not present

## 2021-01-10 DIAGNOSIS — H409 Unspecified glaucoma: Secondary | ICD-10-CM | POA: Diagnosis not present

## 2021-01-10 DIAGNOSIS — E1143 Type 2 diabetes mellitus with diabetic autonomic (poly)neuropathy: Secondary | ICD-10-CM | POA: Diagnosis not present

## 2021-01-10 DIAGNOSIS — E785 Hyperlipidemia, unspecified: Secondary | ICD-10-CM | POA: Diagnosis not present

## 2021-01-10 DIAGNOSIS — D84821 Immunodeficiency due to drugs: Secondary | ICD-10-CM | POA: Diagnosis not present

## 2021-01-11 DIAGNOSIS — D631 Anemia in chronic kidney disease: Secondary | ICD-10-CM | POA: Diagnosis not present

## 2021-01-11 DIAGNOSIS — E1143 Type 2 diabetes mellitus with diabetic autonomic (poly)neuropathy: Secondary | ICD-10-CM | POA: Diagnosis not present

## 2021-01-11 DIAGNOSIS — E1122 Type 2 diabetes mellitus with diabetic chronic kidney disease: Secondary | ICD-10-CM | POA: Diagnosis not present

## 2021-01-11 DIAGNOSIS — I872 Venous insufficiency (chronic) (peripheral): Secondary | ICD-10-CM | POA: Diagnosis not present

## 2021-01-11 DIAGNOSIS — I129 Hypertensive chronic kidney disease with stage 1 through stage 4 chronic kidney disease, or unspecified chronic kidney disease: Secondary | ICD-10-CM | POA: Diagnosis not present

## 2021-01-11 DIAGNOSIS — E1142 Type 2 diabetes mellitus with diabetic polyneuropathy: Secondary | ICD-10-CM | POA: Diagnosis not present

## 2021-01-11 DIAGNOSIS — L97822 Non-pressure chronic ulcer of other part of left lower leg with fat layer exposed: Secondary | ICD-10-CM | POA: Diagnosis not present

## 2021-01-11 DIAGNOSIS — L97812 Non-pressure chronic ulcer of other part of right lower leg with fat layer exposed: Secondary | ICD-10-CM | POA: Diagnosis not present

## 2021-01-11 DIAGNOSIS — N1831 Chronic kidney disease, stage 3a: Secondary | ICD-10-CM | POA: Diagnosis not present

## 2021-01-11 DIAGNOSIS — M1710 Unilateral primary osteoarthritis, unspecified knee: Secondary | ICD-10-CM | POA: Diagnosis not present

## 2021-01-16 ENCOUNTER — Encounter (HOSPITAL_BASED_OUTPATIENT_CLINIC_OR_DEPARTMENT_OTHER): Payer: Medicare HMO | Admitting: Internal Medicine

## 2021-01-16 DIAGNOSIS — I872 Venous insufficiency (chronic) (peripheral): Secondary | ICD-10-CM | POA: Diagnosis not present

## 2021-01-16 DIAGNOSIS — E1143 Type 2 diabetes mellitus with diabetic autonomic (poly)neuropathy: Secondary | ICD-10-CM | POA: Diagnosis not present

## 2021-01-16 DIAGNOSIS — E1142 Type 2 diabetes mellitus with diabetic polyneuropathy: Secondary | ICD-10-CM | POA: Diagnosis not present

## 2021-01-16 DIAGNOSIS — D631 Anemia in chronic kidney disease: Secondary | ICD-10-CM | POA: Diagnosis not present

## 2021-01-16 DIAGNOSIS — I129 Hypertensive chronic kidney disease with stage 1 through stage 4 chronic kidney disease, or unspecified chronic kidney disease: Secondary | ICD-10-CM | POA: Diagnosis not present

## 2021-01-16 DIAGNOSIS — M1710 Unilateral primary osteoarthritis, unspecified knee: Secondary | ICD-10-CM | POA: Diagnosis not present

## 2021-01-16 DIAGNOSIS — E1122 Type 2 diabetes mellitus with diabetic chronic kidney disease: Secondary | ICD-10-CM | POA: Diagnosis not present

## 2021-01-16 DIAGNOSIS — L97822 Non-pressure chronic ulcer of other part of left lower leg with fat layer exposed: Secondary | ICD-10-CM | POA: Diagnosis not present

## 2021-01-16 DIAGNOSIS — N1831 Chronic kidney disease, stage 3a: Secondary | ICD-10-CM | POA: Diagnosis not present

## 2021-01-16 DIAGNOSIS — L97812 Non-pressure chronic ulcer of other part of right lower leg with fat layer exposed: Secondary | ICD-10-CM | POA: Diagnosis not present

## 2021-01-18 ENCOUNTER — Encounter (HOSPITAL_BASED_OUTPATIENT_CLINIC_OR_DEPARTMENT_OTHER): Payer: Medicare HMO | Admitting: Internal Medicine

## 2021-01-18 ENCOUNTER — Other Ambulatory Visit: Payer: Self-pay

## 2021-01-18 ENCOUNTER — Other Ambulatory Visit (HOSPITAL_COMMUNITY)
Admission: RE | Admit: 2021-01-18 | Discharge: 2021-01-18 | Disposition: A | Discharge status: Home / Self Care | Payer: Medicare HMO | Source: Other Acute Inpatient Hospital | Attending: Internal Medicine | Admitting: Internal Medicine

## 2021-01-18 DIAGNOSIS — Z885 Allergy status to narcotic agent status: Secondary | ICD-10-CM | POA: Diagnosis not present

## 2021-01-18 DIAGNOSIS — E1140 Type 2 diabetes mellitus with diabetic neuropathy, unspecified: Secondary | ICD-10-CM | POA: Diagnosis not present

## 2021-01-18 DIAGNOSIS — L97812 Non-pressure chronic ulcer of other part of right lower leg with fat layer exposed: Secondary | ICD-10-CM | POA: Diagnosis not present

## 2021-01-18 DIAGNOSIS — L97418 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: Secondary | ICD-10-CM | POA: Diagnosis not present

## 2021-01-18 DIAGNOSIS — I129 Hypertensive chronic kidney disease with stage 1 through stage 4 chronic kidney disease, or unspecified chronic kidney disease: Secondary | ICD-10-CM | POA: Diagnosis not present

## 2021-01-18 DIAGNOSIS — L97821 Non-pressure chronic ulcer of other part of left lower leg limited to breakdown of skin: Secondary | ICD-10-CM | POA: Diagnosis not present

## 2021-01-18 DIAGNOSIS — I7300 Raynaud's syndrome without gangrene: Secondary | ICD-10-CM | POA: Diagnosis not present

## 2021-01-18 DIAGNOSIS — I872 Venous insufficiency (chronic) (peripheral): Secondary | ICD-10-CM | POA: Diagnosis not present

## 2021-01-18 DIAGNOSIS — Z886 Allergy status to analgesic agent status: Secondary | ICD-10-CM | POA: Diagnosis not present

## 2021-01-18 DIAGNOSIS — Z888 Allergy status to other drugs, medicaments and biological substances status: Secondary | ICD-10-CM | POA: Diagnosis not present

## 2021-01-18 DIAGNOSIS — L97819 Non-pressure chronic ulcer of other part of right lower leg with unspecified severity: Secondary | ICD-10-CM | POA: Diagnosis not present

## 2021-01-18 DIAGNOSIS — Z92241 Personal history of systemic steroid therapy: Secondary | ICD-10-CM | POA: Diagnosis not present

## 2021-01-18 DIAGNOSIS — N183 Chronic kidney disease, stage 3 unspecified: Secondary | ICD-10-CM | POA: Diagnosis not present

## 2021-01-18 DIAGNOSIS — E11621 Type 2 diabetes mellitus with foot ulcer: Secondary | ICD-10-CM | POA: Diagnosis not present

## 2021-01-18 DIAGNOSIS — L97422 Non-pressure chronic ulcer of left heel and midfoot with fat layer exposed: Secondary | ICD-10-CM | POA: Diagnosis not present

## 2021-01-18 DIAGNOSIS — L97822 Non-pressure chronic ulcer of other part of left lower leg with fat layer exposed: Secondary | ICD-10-CM | POA: Diagnosis not present

## 2021-01-18 DIAGNOSIS — E11622 Type 2 diabetes mellitus with other skin ulcer: Secondary | ICD-10-CM | POA: Diagnosis not present

## 2021-01-18 DIAGNOSIS — L97428 Non-pressure chronic ulcer of left heel and midfoot with other specified severity: Secondary | ICD-10-CM | POA: Diagnosis not present

## 2021-01-18 DIAGNOSIS — E1122 Type 2 diabetes mellitus with diabetic chronic kidney disease: Secondary | ICD-10-CM | POA: Diagnosis not present

## 2021-01-18 NOTE — Progress Notes (Signed)
Sonya Goodwin, Sonya Goodwin ([id number]) Visit Report for 01/18/2021 HPI Details Patient Name: Date of Service: [place], [name] 01/18/2021 11:15 A M Medical Record Number: [id number] Patient Account Number: [id number] Date of Birth/Sex: Treating RN: [birth year] (80 y.o. Sonya Goodwin Primary Care Provider: Nicholes Stairs Other Clinician: Referring Provider: Treating Provider/Extender: Nyra Jabs in Treatment: 20 History of Present Illness HPI Description: We think they have been using silver alginateADMISSION 08/29/2020 This is a 80 year old woman who has wounds on her left lower leg mid aspect. She states that these have been present since June when she was hospitalized with a UTI, delirium possibly medication issues. I do not see any mention of the wounds on her left leg at that time. In any case these develop sometime after this. The patient is followed by Dr. Jacelyn Grip at [place] at [facility]. She has been using bacitracin ointment to the wounds. She has [facility] home health although I am not exactly sure what they are doing. Her husband is angry about a bill from home health again we were not able to help him exactly. The patient has chronic sarcoidosis and adrenal insufficiency she has been on longstanding prednisone. She has all the classic skin features of chronic steroid use. I think this is the primary issue for these wounds. They have also noted weeping fluid coming out of the wounds and even weeping draining fluid on the right leg although we were not able to define any wound on the right leg. She could very well have a component of chronic venous insufficiency as well. Past medical history includes sarcoidosis, type 2 diabetes with a recent hemoglobin A1c of 6.3, diabetic neuropathy, stage III P chronic renal failure, hypertension, adrenal insufficiency. ABI on the right noncompressible on the left at 1.02 9/14 2 small punched out areas of the left  anterior mid tibia. We have been using Iodoflex. Better looking wound surfaces 9/28; the 2 areas on the left anterior mid tibia are about the same. She has a new weeping area on the right side just medial to the tibia. I think all of these wounds are weeping edema fluid from a collection of fluid in the upper third of her lower leg. The skin distally is more fibrosed and there is no room for weeping edema fluid. I wanted to put her in 3 layer compression versus kerlix and Coban but she will not agree to it 10/12; 2-week follow-up. She has the 2 areas on the left anterior mid tibia. There is scissor injury now on the left medial calf. In a single area on the right medial lower extremity. All of these are roughly the same small punched out surfaces. Weeping edema fluid. She does not tolerate anything more than kerlix Coban and even with this she finds this too tight often making her husband cut these off. I had ordered Iodoflex to these wounds but according to her husband that is not what home health is using. 11/9; patient missed her last appointment so she has not been seen here in almost a month. She has the 2 punched-out areas on the left mid tibia 1 medial and 1 lateral a small excoriation a little more superior and lateral also on the left but she has also punched out areas on the right that she did not have last time I am not exactly sure how this happened. She complains about pain in her feet at night especially in the morning. Not really claudication but I  was I was concerned. Her edema is satisfactorily concerned and the kerlix and Coban 12/7; again a monthly follow-up. I am not exactly sure how we got into this monthly pattern. She does have home health coming out 3 times a week. She did manage to get the arterial studies done that I ordered last time. On the right her ABI was noncompressible although she had triphasic waveforms at the dorsalis pedis her great toe pressure was unobtainable.  On the left she had triphasic waveforms at the PTA and dorsalis pedis but again absent great toe waveforms. Segmental waveform analysis showed suggestion of posterior tibial artery occlusion on the right but a biphasic triphasic waveform with a normal normal examination on the left. The patient describes a lot of pain it is not clearly claudication but certainly there is a possibility here. She also has what I think is longstanding Raynaud's dating back to when she was a teenager She has new wounds on the left Achilles heel on the right Achilles heel from last time otherwise her wounds are about the same 1/4; the patient has an appointment with vascular on 01/09/2021. Basically I am asking if there is macrovascular issues that need to be addressed she still complains of a lot of pain in her feet and heels when she walks although I am not sure this is claudication. She also has longstanding Raynaud's by her description dating back to when she was a teenager. She has small punched-out areas on the left anterior lower x2, left anterior leg left heel and right heel. She tells me that the home health nurse stopped wrapping the right leg about 2 weeks ago there is a lot of edema in this leg threatening to break open. Small wound on the right lateral leg. 1/20; the patient's appointment with vascular had to be delayed. She has no open wounds on the right leg on the left she has an area on the left Achilles heel, left medial calf 2 areas anteriorly. All of these small punched out areas. Electronic Signature(s) Signed: 01/18/2021 4:36:20 PM By: Linton Ham MD Entered By: Linton Ham on 01/18/2021 13:01:59 -------------------------------------------------------------------------------- Physical Exam Details Patient Name: Date of Service: Sonya Goodwin. 01/18/2021 11:15 A M Medical Record Number: [id number] Patient Account Number: [id number] Date of Birth/Sex: Treating RN: [birth year] (80 y.o. Helene Shoe, Tammi Klippel Primary Care Provider: Nicholes Stairs Other Clinician: Referring Provider: Treating Provider/Extender: Nyra Jabs in Treatment: 20 Constitutional Sitting or standing Blood Pressure is within target range for patient.. Pulse regular and within target range for patient.Marland Kitchen Respirations regular, non-labored and within target range.. Temperature is normal and within the target range for the patient.Marland Kitchen Appears in no distress. Cardiovascular Her feet are cold. Dorsalis pedis pulses however remain palpable.. Notes Wound exam; left mid tibia x2, left medial calf. The area on the lateral has closed over. The area on the left Achilles heel has been smaller. There is nothing open on the right side Electronic Signature(s) Signed: 01/18/2021 4:36:20 PM By: Linton Ham MD Entered By: Linton Ham on 01/18/2021 13:03:08 -------------------------------------------------------------------------------- Physician Orders Details Patient Name: Date of Service: Sonya Goodwin. 01/18/2021 11:15 A M Medical Record Number: [id number] Patient Account Number: [id number] Date of Birth/Sex: Treating RN: [birth year] (80 y.o. Sonya Goodwin Primary Care Provider: Nicholes Stairs Other Clinician: Referring Provider: Treating Provider/Extender: Nyra Jabs in Treatment: 20 Verbal / Phone Orders: No Diagnosis Coding ICD-10 Coding Code Description [phone] Non-pressure chronic  ulcer of other part of left lower leg limited to breakdown of skin I87.2 Venous insufficiency (chronic) (peripheral) Z92.241 Personal history of systemic steroid therapy L97.819 Non-pressure chronic ulcer of other part of right lower leg with unspecified severity L97.428 Non-pressure chronic ulcer of left heel and midfoot with other specified severity L97.418 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 E11.621 Type 2 diabetes  mellitus with foot ulcer Follow-up Appointments Return A ppointment in 2 weeks. Other: - Please be sure to go to F/U appt. with Vascular Doctor on 02/13/2021. Bathing/ Shower/ Hygiene May shower with protection but do not get wound dressing(s) wet. Edema Control - Lymphedema / SCD / Other Elevate legs to the level of the heart or above for 30 minutes daily and/or when sitting, a frequency of: Avoid standing for long periods of time. Exercise regularly Additional Orders / Instructions Other: - ***Please pad the left dorsal foot**** Home Health New wound care orders this week; continue Home Health for wound care. May utilize formulary equivalent dressing for wound treatment orders unless otherwise specified. [facility] . 2 times per week Wound Treatment Wound #1 - Lower Leg Wound Laterality: Left, Anterior Cleanser: Normal Saline (Home Health) (Generic) 2 x Per Day/30 Days Discharge Instructions: Cleanse the wound with Normal Saline prior to applying a clean dressing using gauze sponges, not tissue or cotton balls. Prim Dressing: Promogran Prisma Matrix, 4.34 (sq in) (silver collagen) (Home Health) (Generic) 2 x Per Day/30 Days ary Discharge Instructions: moisten with normal saline Secondary Dressing: Woven Gauze Sponge, Non-Sterile 4x4 in (Home Health) (Generic) 2 x Per Day/30 Days Discharge Instructions: Apply over primary dressing as directed. Secondary Dressing: ABD Pad, 5x9 (Home Health) 2 x Per Day/30 Days Discharge Instructions: Apply over primary dressing as directed. Compression Wrap: Kerlix Roll 4.5x3.1 (in/yd) (Home Health) (Generic) 2 x Per Day/30 Days Discharge Instructions: Apply Kerlix and Coban compression as directed lightly . from base of toes to patella knotch Compression Wrap: Coban Self-Adherent Wrap 4x5 (in/yd) (Home Health) (Generic) 2 x Per Day/30 Days Discharge Instructions: Apply over Kerlix as directed.from base of toes to patella knotch Wound #10 - Lower Leg  Wound Laterality: Left, Anterior, Proximal Cleanser: Normal Saline ([place]) 2 x Per Day/30 Days Discharge Instructions: Cleanse the wound with Normal Saline prior to applying a clean dressing using gauze sponges, not tissue or cotton balls. Peri-Wound Care: Sween Lotion (Moisturizing lotion) (Generic) 2 x Per Day/30 Days Discharge Instructions: Apply moisturizing lotion as directed Prim Dressing: KerraCel Ag Gelling Fiber Dressing, 2x2 in (silver alginate) ([place]) 2 x Per Day/30 Days ary Discharge Instructions: Apply bactroban in clinic. patient to pick up triple abx ointment and apply under the alginate Ag. Apply silver alginate to wound bed as instructed Secondary Dressing: Woven Gauze Sponge, Non-Sterile 4x4 in (Home Health) 2 x Per Day/30 Days Discharge Instructions: Apply over primary dressing as directed. Compression Wrap: Kerlix Roll 4.5x3.1 (in/yd) (Home Health) 2 x Per Day/30 Days Discharge Instructions: Apply Kerlix and Coban compression as directed. Compression Wrap: Coban Self-Adherent Wrap 4x5 (in/yd) (Home Health) 2 x Per Day/30 Days Discharge Instructions: Apply over Kerlix as directed. Wound #6 - Lower Leg Wound Laterality: Left, Medial Cleanser: Normal Saline (Home Health) (Generic) 2 x Per Day/30 Days Discharge Instructions: Cleanse the wound with Normal Saline prior to applying a clean dressing using gauze sponges, not tissue or cotton balls. Prim Dressing: Promogran Prisma Matrix, 4.34 (sq in) (silver collagen) (Home Health) (Generic) 2 x Per Day/30 Days  ary Discharge Instructions: moisten with normal saline Secondary Dressing: Woven Gauze Sponge, Non-Sterile 4x4 in (Home Health) (Generic) 2 x Per Day/30 Days Discharge Instructions: Apply over primary dressing as directed. Secondary Dressing: ABD Pad, 5x9 (Home Health) 2 x Per Day/30 Days Discharge Instructions: Apply over primary dressing as directed. Compression Wrap: Kerlix Roll 4.5x3.1 (in/yd) (Home Health)  (Generic) 2 x Per Day/30 Days Discharge Instructions: Apply Kerlix and Coban compression as directed.from base of toes to patella knotch Compression Wrap: Coban Self-Adherent Wrap 4x5 (in/yd) (Home Health) (Generic) 2 x Per Day/30 Days Discharge Instructions: Apply over Kerlix as directed.from base of toes to patella knotch Wound #8 - Calcaneus Wound Laterality: Left, Distal Cleanser: Normal Saline (Home Health) (Generic) 2 x Per Day/30 Days Discharge Instructions: Cleanse the wound with Normal Saline prior to applying a clean dressing using gauze sponges, not tissue or cotton balls. Prim Dressing: Promogran Prisma Matrix, 4.34 (sq in) (silver collagen) (Home Health) (Generic) 2 x Per Day/30 Days ary Discharge Instructions: moisten with normal saline Secondary Dressing: Woven Gauze Sponge, Non-Sterile 4x4 in (Home Health) (Generic) 2 x Per Day/30 Days Discharge Instructions: Apply over primary dressing as directed. Secondary Dressing: ABD Pad, 5x9 (Home Health) 2 x Per Day/30 Days Discharge Instructions: Apply over primary dressing as directed. Compression Wrap: Kerlix Roll 4.5x3.1 (in/yd) (Home Health) (Generic) 2 x Per Day/30 Days Discharge Instructions: Apply Kerlix and Coban compression as directed. from base of toes to patella knotch Compression Wrap: Coban Self-Adherent Wrap 4x5 (in/yd) (Home Health) (Generic) 2 x Per Day/30 Days Discharge Instructions: Apply over Kerlix as directed. from base of toes to patella knotch Laboratory naerobe culture (MICRO) - culture of left proximal anterior lower leg wound. Bacteria identified in Unspecified specimen by A LOINC Code: [id number] Convenience Name: Anerobic culture Electronic Signature(s) Signed: 01/18/2021 4:36:20 PM By: Linton Ham MD Signed: 01/18/2021 4:56:08 PM By: Deon Pilling Entered By: Deon Pilling on 01/18/2021 12:27:17 Prescription  01/18/2021 -------------------------------------------------------------------------------- Rachael Darby. Linton Ham MD Patient Name: Provider: 02-04-1941 [id number] Date of Birth: NPI#: [id number] Sex: DEA #: [phone] [id number] Phone #: License #: [place] Patient Address: [place], [place] [address] [place], [place] [address] [phone] Allergies metoclopramide; codeine; menthol; Statins-Hmg-Coa Reductase Inhibitors; alendronate sodium; aspirin; fentanyl; fenofibrate; Fosamax; losartan; Miacalcin; nitrofurantoin; NSAIDS (Non-Steroidal Anti-Inflammatory Drug); Prolia; Sulfa (Sulfonamide Antibiotics); tolmetin; WelChol; Zetia; [place] Provider's Orders naerobe culture - culture of left proximal anterior lower leg wound. Bacteria identified in Unspecified specimen by A LOINC Code: [id number] Convenience Name: Anerobic culture Hand Signature: Date(s): Electronic Signature(s) Signed: 01/18/2021 4:36:20 PM By: Linton Ham MD Signed: 01/18/2021 4:56:08 PM By: Deon Pilling Entered By: Deon Pilling on 01/18/2021 12:27:19 -------------------------------------------------------------------------------- Problem List Details Patient Name: Date of Service: Sonya Goodwin. 01/18/2021 11:15 A M Medical Record Number: [id number] Patient Account Number: [id number] Date of Birth/Sex: Treating RN: 1941-05-30 (80 y.o. Helene Shoe, Tammi Klippel Primary Care Provider: Nicholes Stairs Other Clinician: Referring Provider: Treating Provider/Extender: Nyra Jabs in Treatment: 20 Active Problems ICD-10 Encounter Code Description Active Date MDM Diagnosis [phone] Non-pressure chronic ulcer of other part of left lower leg limited to breakdown 08/29/2020 No Yes of skin I87.2 Venous insufficiency (chronic) (peripheral) 08/29/2020 No Yes Z92.241 Personal history of systemic  steroid therapy 08/29/2020 No Yes L97.819 Non-pressure chronic ulcer of other part of right lower leg with unspecified 09/26/2020 No Yes severity L97.428 Non-pressure chronic ulcer of left heel and midfoot  with other specified 12/05/2020 No Yes severity L97.418 Non-pressure chronic ulcer of right heel and midfoot with other specified 12/05/2020 No Yes severity E11.621 Type 2 diabetes mellitus with foot ulcer 12/05/2020 No Yes Inactive Problems Resolved Problems Electronic Signature(s) Signed: 01/18/2021 4:36:20 PM By: Linton Ham MD Entered By: Linton Ham on 01/18/2021 12:59:08 -------------------------------------------------------------------------------- Progress Note Details Patient Name: Date of Service: Sonya Goodwin. 01/18/2021 11:15 A M Medical Record Number: [id number] Patient Account Number: [id number] Date of Birth/Sex: Treating RN: [birth year] (80 y.o. Helene Shoe, Tammi Klippel Primary Care Provider: Nicholes Stairs Other Clinician: Referring Provider: Treating Provider/Extender: Nyra Jabs in Treatment: 20 Subjective History of Present Illness (HPI) We think they have been using silver alginateADMISSION 08/29/2020 This is a 80 year old woman who has wounds on her left lower leg mid aspect. She states that these have been present since June when she was hospitalized with a UTI, delirium possibly medication issues. I do not see any mention of the wounds on her left leg at that time. In any case these develop sometime after this. The patient is followed by Dr. Jacelyn Grip at [place] at [facility]. She has been using bacitracin ointment to the wounds. She has [facility] home health although I am not exactly sure what they are doing. Her husband is angry about a bill from home health again we were not able to help him exactly. The patient has chronic sarcoidosis and adrenal insufficiency she has been on longstanding prednisone. She has all the classic  skin features of chronic steroid use. I think this is the primary issue for these wounds. They have also noted weeping fluid coming out of the wounds and even weeping draining fluid on the right leg although we were not able to define any wound on the right leg. She could very well have a component of chronic venous insufficiency as well. Past medical history includes sarcoidosis, type 2 diabetes with a recent hemoglobin A1c of 6.3, diabetic neuropathy, stage III P chronic renal failure, hypertension, adrenal insufficiency. ABI on the right noncompressible on the left at 1.02 9/14 2 small punched out areas of the left anterior mid tibia. We have been using Iodoflex. Better looking wound surfaces 9/28; the 2 areas on the left anterior mid tibia are about the same. She has a new weeping area on the right side just medial to the tibia. I think all of these wounds are weeping edema fluid from a collection of fluid in the upper third of her lower leg. The skin distally is more fibrosed and there is no room for weeping edema fluid. I wanted to put her in 3 layer compression versus kerlix and Coban but she will not agree to it 10/12; 2-week follow-up. She has the 2 areas on the left anterior mid tibia. There is scissor injury now on the left medial calf. In a single area on the right medial lower extremity. All of these are roughly the same small punched out surfaces. Weeping edema fluid. She does not tolerate anything more than kerlix Coban and even with this she finds this too tight often making her husband cut these off. I had ordered Iodoflex to these wounds but according to her husband that is not what home health is using. 11/9; patient missed her last appointment so she has not been seen here in almost a month. She has the 2 punched-out areas on the left mid tibia 1 medial and 1 lateral a small excoriation a little more superior  and lateral also on the left but she has also punched out areas on the  right that she did not have last time I am not exactly sure how this happened. She complains about pain in her feet at night especially in the morning. Not really claudication but I was I was concerned. Her edema is satisfactorily concerned and the kerlix and Coban 12/7; again a monthly follow-up. I am not exactly sure how we got into this monthly pattern. She does have home health coming out 3 times a week. She did manage to get the arterial studies done that I ordered last time. On the right her ABI was noncompressible although she had triphasic waveforms at the dorsalis pedis her great toe pressure was unobtainable. On the left she had triphasic waveforms at the PTA and dorsalis pedis but again absent great toe waveforms. Segmental waveform analysis showed suggestion of posterior tibial artery occlusion on the right but a biphasic triphasic waveform with a normal normal examination on the left. The patient describes a lot of pain it is not clearly claudication but certainly there is a possibility here. She also has what I think is longstanding Raynaud's dating back to when she was a teenager She has new wounds on the left Achilles heel on the right Achilles heel from last time otherwise her wounds are about the same 1/4; the patient has an appointment with vascular on 01/09/2021. Basically I am asking if there is macrovascular issues that need to be addressed she still complains of a lot of pain in her feet and heels when she walks although I am not sure this is claudication. She also has longstanding Raynaud's by her description dating back to when she was a teenager. She has small punched-out areas on the left anterior lower x2, left anterior leg left heel and right heel. She tells me that the home health nurse stopped wrapping the right leg about 2 weeks ago there is a lot of edema in this leg threatening to break open. Small wound on the right lateral leg. 1/20; the patient's appointment with  vascular had to be delayed. She has no open wounds on the right leg on the left she has an area on the left Achilles heel, left medial calf 2 areas anteriorly. All of these small punched out areas. Objective Constitutional Sitting or standing Blood Pressure is within target range for patient.. Pulse regular and within target range for patient.Marland Kitchen Respirations regular, non-labored and within target range.. Temperature is normal and within the target range for the patient.Marland Kitchen Appears in no distress. Vitals Time Taken: 11:37 AM, Height: 66 in, Weight: 141 lbs, BMI: 22.8, Temperature: 98.1 F, Pulse: 66 bpm, Respiratory Rate: 17 breaths/min, Blood Pressure: 126/74 mmHg. Cardiovascular Her feet are cold. Dorsalis pedis pulses however remain palpable.. General Notes: Wound exam; left mid tibia x2, left medial calf. The area on the lateral has closed over. The area on the left Achilles heel has been smaller. There is nothing open on the right side Integumentary (Hair, Skin) Wound #1 status is Open. Original cause of wound was Gradually Appeared. The wound is located on the Left,Anterior Lower Leg. The wound measures 0.9cm length x 0.9cm width x 0.3cm depth; 0.636cm^2 area and 0.191cm^3 volume. There is Fat Layer (Subcutaneous Tissue) exposed. There is no tunneling noted, however, there is undermining starting at 12:00 and ending at 12:00 with a maximum distance of 0.3cm. There is a small amount of serosanguineous drainage noted. The wound margin is flat and  intact. There is large (67-100%) red granulation within the wound bed. There is a small (1-33%) amount of necrotic tissue within the wound bed including Adherent Slough. Wound #10 status is Open. Original cause of wound was Blister. The wound is located on the Left,Proximal,Anterior Lower Leg. The wound measures 0.5cm length x 0.5cm width x 0.1cm depth; 0.196cm^2 area and 0.02cm^3 volume. There is no tunneling or undermining noted. There is a medium  amount of purulent drainage noted. The wound margin is distinct with the outline attached to the wound base. There is no granulation within the wound bed. There is a large (67- 100%) amount of necrotic tissue within the wound bed including Adherent Slough. Wound #2 status is Healed - Epithelialized. Original cause of wound was Gradually Appeared. The wound is located on the Left,Lateral Lower Leg. The wound measures 0cm length x 0cm width x 0cm depth; 0cm^2 area and 0cm^3 volume. Wound #5 status is Open. Original cause of wound was Trauma. The wound is located on the Right,Lateral Lower Leg. The wound measures 0cm length x 0cm width x 0cm depth; 0cm^2 area and 0cm^3 volume. There is Fat Layer (Subcutaneous Tissue) exposed. There is a small amount of serosanguineous drainage noted. The wound margin is flat and intact. There is medium (34-66%) red granulation within the wound bed. There is a medium (34-66%) amount of necrotic tissue within the wound bed including Adherent Slough. Wound #6 status is Open. Original cause of wound was Gradually Appeared. The wound is located on the Left,Medial Lower Leg. The wound measures 1cm length x 0.5cm width x 0.2cm depth; 0.393cm^2 area and 0.079cm^3 volume. There is no tunneling or undermining noted. There is a medium amount of purulent drainage noted. The wound margin is distinct with the outline attached to the wound base. There is no granulation within the wound bed. There is a large (67- 100%) amount of necrotic tissue within the wound bed including Adherent Slough. Wound #8 status is Open. Original cause of wound was Gradually Appeared. The wound is located on the Left,Distal Calcaneus. The wound measures 0.2cm length x 1.2cm width x 0.1cm depth; 0.188cm^2 area and 0.019cm^3 volume. There is Fat Layer (Subcutaneous Tissue) exposed. There is no tunneling or undermining noted. There is a medium amount of serosanguineous drainage noted. The wound margin is  distinct with the outline attached to the wound base. There is small (1-33%) red granulation within the wound bed. There is a large (67-100%) amount of necrotic tissue within the wound bed including Adherent Slough. Wound #9 status is Healed - Epithelialized. Original cause of wound was Gradually Appeared. The wound is located on the Right Calcaneus. The wound measures 0cm length x 0cm width x 0cm depth; 0cm^2 area and 0cm^3 volume. 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 Other: - Please be sure to go to F/U appt. with Vascular Doctor on 02/13/2021. Bathing/ Shower/ Hygiene: May shower with protection but do not get wound dressing(s) wet. Edema Control - Lymphedema / SCD / Other: Elevate legs to the level of the heart or above for 30  minutes daily and/or when sitting, a frequency of: Avoid standing for long periods of time. Exercise regularly Additional Orders / Instructions: Other: - ***Please pad the left dorsal foot**** Home Health: New wound care orders this week; continue Home Health for wound care. May utilize formulary equivalent dressing for wound treatment orders unless otherwise specified. [facility] . 2 times per week Laboratory ordered were: Anerobic culture - culture of left proximal anterior lower leg wound. WOUND #1: - Lower Leg Wound Laterality: Left, Anterior Cleanser: Normal Saline (Home Health) (Generic) 2 x Per Day/30 Days Discharge Instructions:  Cleanse the wound with Normal Saline prior to applying a clean dressing using gauze sponges, not tissue or cotton balls. Prim Dressing: Promogran Prisma Matrix, 4.34 (sq in) (silver collagen) (Home Health) (Generic) 2 x Per Day/30 Days ary Discharge Instructions: moisten with normal saline Secondary Dressing: Woven Gauze Sponge, Non-Sterile 4x4 in (Home Health) (Generic) 2 x Per Day/30 Days Discharge Instructions: Apply over primary dressing as directed. Secondary Dressing: ABD Pad, 5x9 (Home Health) 2 x Per Day/30 Days Discharge Instructions: Apply over primary dressing as directed. Com pression Wrap: Kerlix Roll 4.5x3.1 (in/yd) (Home Health) (Generic) 2 x Per Day/30 Days Discharge Instructions: Apply Kerlix and Coban compression as directed lightly . from base of toes to patella knotch Com pression Wrap: Coban Self-Adherent Wrap 4x5 (in/yd) (Home Health) (Generic) 2 x Per Day/30 Days Discharge Instructions: Apply over Kerlix as directed.from base of toes to patella knotch WOUND #10: - Lower Leg Wound Laterality: Left, Anterior, Proximal Cleanser: Normal Saline ([place]) 2 x Per Day/30 Days Discharge Instructions: Cleanse the wound with Normal Saline prior to applying a clean dressing using gauze sponges, not tissue or cotton balls. Peri-Wound Care: Sween Lotion (Moisturizing lotion) (Generic) 2 x Per Day/30 Days Discharge Instructions: Apply moisturizing lotion as directed Prim Dressing: KerraCel Ag Gelling Fiber Dressing, 2x2 in (silver alginate) ([place]) 2 x Per Day/30 Days ary Discharge Instructions: Apply bactroban in clinic. patient to pick up triple abx ointment and apply under the alginate Ag. Apply silver alginate to wound bed as instructed Secondary Dressing: Woven Gauze Sponge, Non-Sterile 4x4 in (Home Health) 2 x Per Day/30 Days Discharge Instructions: Apply over primary dressing as directed. Com pression Wrap: Kerlix Roll 4.5x3.1 (in/yd) (Home Health) 2 x Per Day/30  Days Discharge Instructions: Apply Kerlix and Coban compression as directed. Com pression Wrap: Coban Self-Adherent Wrap 4x5 (in/yd) (Home Health) 2 x Per Day/30 Days Discharge Instructions: Apply over Kerlix as directed. WOUND #6: - Lower Leg Wound Laterality: Left, Medial Cleanser: Normal Saline (Home Health) (Generic) 2 x Per Day/30 Days Discharge Instructions: Cleanse the wound with Normal Saline prior to applying a clean dressing using gauze sponges, not tissue or cotton balls. Prim Dressing: Promogran Prisma Matrix, 4.34 (sq in) (silver collagen) (Home Health) (Generic) 2 x Per Day/30 Days ary Discharge Instructions: moisten with normal saline Secondary Dressing: Woven Gauze Sponge, Non-Sterile 4x4 in (Home Health) (Generic) 2 x Per Day/30 Days Discharge Instructions: Apply over primary dressing as directed. Secondary Dressing: ABD Pad, 5x9 (Home Health) 2 x Per Day/30 Days Discharge Instructions: Apply over primary dressing as directed. Com pression Wrap: Kerlix Roll 4.5x3.1 (in/yd) (Home Health) (Generic) 2 x Per Day/30 Days Discharge Instructions: Apply Kerlix and Coban compression as directed.from base of toes to patella knotch Com pression Wrap: Coban Self-Adherent Wrap 4x5 (in/yd) (Home Health) (Generic) 2 x Per Day/30 Days Discharge Instructions: Apply over Kerlix as directed.from base of toes to patella knotch WOUND #8: - Calcaneus  Wound Laterality: Left, Distal Cleanser: Normal Saline (Home Health) (Generic) 2 x Per Day/30 Days Discharge Instructions: Cleanse the wound with Normal Saline prior to applying a clean dressing using gauze sponges, not tissue or cotton balls. Prim Dressing: Promogran Prisma Matrix, 4.34 (sq in) (silver collagen) (Home Health) (Generic) 2 x Per Day/30 Days ary Discharge Instructions: moisten with normal saline Secondary Dressing: Woven Gauze Sponge, Non-Sterile 4x4 in (Home Health) (Generic) 2 x Per Day/30 Days Discharge Instructions: Apply over  primary dressing as directed. Secondary Dressing: ABD Pad, 5x9 (Home Health) 2 x Per Day/30 Days Discharge Instructions: Apply over primary dressing as directed. Compression Wrap: Kerlix Roll 4.5x3.1 (in/yd) (Home Health) (Generic) 2 x Per Day/30 Days Discharge Instructions: Apply Kerlix and Coban compression as directed. from base of toes to patella knotch Compression Wrap: Coban Self-Adherent Wrap 4x5 (in/yd) (Home Health) (Generic) 2 x Per Day/30 Days Discharge Instructions: Apply over Kerlix as directed. from base of toes to patella knotch 1. Continue with silver alginate to the open areas on the left leg. Still under kerlix and Coban 2. I am looking to vascular surgery just to make sure that there is no macrovascular disease. She may have microvascular disease plus or minus Raynaud's. Electronic Signature(s) Signed: 01/18/2021 4:36:20 PM By: Linton Ham MD Entered By: Linton Ham on 01/18/2021 13:04:28 -------------------------------------------------------------------------------- SuperBill Details Patient Name: Date of Service: Sonya Goodwin 01/18/2021 Medical Record Number: [id number] Patient Account Number: [id number] Date of Birth/Sex: Treating RN: 1941/01/08 (80 y.o. 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 L97.418 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 E11.621  Type 2 diabetes mellitus with foot ulcer Facility Procedures CPT4 Code: 62831517 Description: 61607 - WOUND CARE VISIT-LEV 5 EST PT Modifier: Quantity: 1 Physician Procedures : CPT4 Code Description Modifier [id number] - WC PHYS LEVEL 3 - EST PT ICD-10 Diagnosis Description L97.821 Non-pressure chronic ulcer of other part of left lower leg limited to breakdown of skin L97.428 Non-pressure chronic ulcer of left heel and  midfoot with other specified severity Quantity: 1 Electronic Signature(s) Signed: 01/18/2021 4:36:20 PM By: Linton Ham MD Signed: 01/18/2021 4:56:08 PM By: Deon Pilling Entered By: Deon Pilling on 01/18/2021 15:13:20

## 2021-01-21 LAB — AEROBIC CULTURE W GRAM STAIN (SUPERFICIAL SPECIMEN)

## 2021-01-22 DIAGNOSIS — N1831 Chronic kidney disease, stage 3a: Secondary | ICD-10-CM | POA: Diagnosis not present

## 2021-01-22 DIAGNOSIS — M1710 Unilateral primary osteoarthritis, unspecified knee: Secondary | ICD-10-CM | POA: Diagnosis not present

## 2021-01-22 DIAGNOSIS — D631 Anemia in chronic kidney disease: Secondary | ICD-10-CM | POA: Diagnosis not present

## 2021-01-22 DIAGNOSIS — E1142 Type 2 diabetes mellitus with diabetic polyneuropathy: Secondary | ICD-10-CM | POA: Diagnosis not present

## 2021-01-22 DIAGNOSIS — E1143 Type 2 diabetes mellitus with diabetic autonomic (poly)neuropathy: Secondary | ICD-10-CM | POA: Diagnosis not present

## 2021-01-22 DIAGNOSIS — L97822 Non-pressure chronic ulcer of other part of left lower leg with fat layer exposed: Secondary | ICD-10-CM | POA: Diagnosis not present

## 2021-01-22 DIAGNOSIS — L97812 Non-pressure chronic ulcer of other part of right lower leg with fat layer exposed: Secondary | ICD-10-CM | POA: Diagnosis not present

## 2021-01-22 DIAGNOSIS — E1122 Type 2 diabetes mellitus with diabetic chronic kidney disease: Secondary | ICD-10-CM | POA: Diagnosis not present

## 2021-01-22 DIAGNOSIS — I872 Venous insufficiency (chronic) (peripheral): Secondary | ICD-10-CM | POA: Diagnosis not present

## 2021-01-22 DIAGNOSIS — I129 Hypertensive chronic kidney disease with stage 1 through stage 4 chronic kidney disease, or unspecified chronic kidney disease: Secondary | ICD-10-CM | POA: Diagnosis not present

## 2021-01-24 DIAGNOSIS — I872 Venous insufficiency (chronic) (peripheral): Secondary | ICD-10-CM | POA: Diagnosis not present

## 2021-01-24 NOTE — Progress Notes (Signed)
Sonya Goodwin, Sonya Goodwin ([id number]) Visit Report for 01/18/2021 Arrival Information Details Patient Name: Date of Service: [place], [name] 01/18/2021 11:15 A M Medical Record Number: [id number] Patient Account Number: [id number] Date of Birth/Sex: Treating RN: 12/13/1941 (80 y.o. Sonya Goodwin, Sonya Goodwin Primary Care Sonya Goodwin: Sonya Goodwin Other Clinician: Referring Sonya Goodwin: Treating Sonya Goodwin/Extender: Sonya Goodwin: 20 Visit Information History Since Last Visit Added or deleted any medications: No Patient Arrived: Wheel Chair Any new allergies or adverse reactions: No Arrival Time: 11:36 Had a fall or experienced change in No Accompanied By: husband activities of daily living that may affect Transfer Assistance: None risk of falls: Patient Identification Verified: Yes Signs or symptoms of abuse/neglect since last visito No Secondary Verification Process Completed: Yes Hospitalized since last visit: No Patient Requires Transmission-Based Precautions: No Implantable device outside of the clinic excluding No Patient Has Alerts: No cellular tissue based products placed in the center since last visit: Has Dressing in Place as Prescribed: Yes Pain Present Now: Yes Electronic Signature(s) Signed: 01/24/2021 5:56:51 PM By: Rhae Hammock RN Entered By: Rhae Hammock on 01/18/2021 11:36:57 -------------------------------------------------------------------------------- Clinic Level of Care Assessment Details Patient Name: Date of Service: Sonya Goodwin 01/18/2021 11:15 A M Medical Record Number: [id number] Patient Account Number: [id number] Date of Birth/Sex: Treating RN: 1941-08-22 (80 y.o. Sonya Goodwin, Sonya Goodwin Primary Care Kawhi Diebold: Sonya Goodwin Other Clinician: Referring Sonya Goodwin: Treating Sonya Goodwin/Extender: Sonya Goodwin: 20 Clinic Level of Care Assessment Items TOOL 4 Quantity Score X- 1  0 Use when only an EandM is performed on FOLLOW-UP visit ASSESSMENTS - Nursing Assessment / Reassessment X- 1 10 Reassessment of Co-morbidities (includes updates in patient status) X- 1 5 Reassessment of Adherence to Goodwin Plan ASSESSMENTS - Wound and Skin A ssessment / Reassessment []  - 0 Simple Wound Assessment / Reassessment - one wound X- 4 5 Complex Wound Assessment / Reassessment - multiple wounds X- 1 10 Dermatologic / Skin Assessment (not related to wound area) ASSESSMENTS - Focused Assessment X- 2 5 Circumferential Edema Measurements - multi extremities X- 1 10 Nutritional Assessment / Counseling / Intervention []  - 0 Lower Extremity Assessment (monofilament, tuning fork, pulses) []  - 0 Peripheral Arterial Disease Assessment (using hand held doppler) ASSESSMENTS - Ostomy and/or Continence Assessment and Care []  - 0 Incontinence Assessment and Management []  - 0 Ostomy Care Assessment and Management (repouching, etc.) PROCESS - Coordination of Care []  - 0 Simple Patient / Family Education for ongoing care X- 1 20 Complex (extensive) Patient / Family Education for ongoing care X- 1 10 Staff obtains Consents, Records, T Results / Process Orders est X- 1 10 Staff telephones HHA, Nursing Homes / Clarify orders / etc []  - 0 Routine Transfer to another Facility (non-emergent condition) []  - 0 Routine Hospital Admission (non-emergent condition) []  - 0 New Admissions / Biomedical engineer / Ordering NPWT Apligraf, etc. , []  - 0 Emergency Hospital Admission (emergent condition) []  - 0 Simple Discharge Coordination X- 1 15 Complex (extensive) Discharge Coordination PROCESS - Special Needs []  - 0 Pediatric / Minor Patient Management []  - 0 Isolation Patient Management []  - 0 Hearing / Language / Visual special needs []  - 0 Assessment of Community assistance (transportation, D/C planning, etc.) []  - 0 Additional assistance / Altered mentation []  -  0 Support Surface(s) Assessment (bed, cushion, seat, etc.) INTERVENTIONS - Wound Cleansing / Measurement []  - 0 Simple Wound Cleansing - one wound X- 4 5 Complex Wound Cleansing -  multiple wounds X- 1 5 Wound Imaging (photographs - any number of wounds) []  - 0 Wound Tracing (instead of photographs) []  - 0 Simple Wound Measurement - one wound X- 4 5 Complex Wound Measurement - multiple wounds INTERVENTIONS - Wound Dressings []  - 0 Small Wound Dressing one or multiple wounds []  - 0 Medium Wound Dressing one or multiple wounds X- 2 20 Large Wound Dressing one or multiple wounds []  - 0 Application of Medications - topical []  - 0 Application of Medications - injection INTERVENTIONS - Miscellaneous []  - 0 External ear exam []  - 0 Specimen Collection (cultures, biopsies, blood, body fluids, etc.) []  - 0 Specimen(s) / Culture(s) sent or taken to Lab for analysis []  - 0 Patient Transfer (multiple staff / Harrel Lemon Lift / Similar devices) []  - 0 Simple Staple / Suture removal (25 or less) []  - 0 Complex Staple / Suture removal (26 or more) []  - 0 Hypo / Hyperglycemic Management (close monitor of Blood Glucose) []  - 0 Ankle / Brachial Index (ABI) - do not check if billed separately X- 1 5 Vital Signs Has the patient been seen at the hospital within the last three years: Yes Total Score: 210 Level Of Care: New/Established - Level 5 Electronic Signature(s) Signed: 01/18/2021 4:56:08 PM By: Sonya Goodwin Entered By: Sonya Goodwin on 01/18/2021 14:56:53 -------------------------------------------------------------------------------- Encounter Discharge Information Details Patient Name: Date of Service: Sonya Chambers. 01/18/2021 11:15 A M Medical Record Number: [id number] Patient Account Number: [id number] Date of Birth/Sex: Treating RN: 02-27-1941 (80 y.o. [name] Primary Care Kaliyan Osbourn: Sonya Goodwin Other Clinician: Referring Kolt Mcwhirter: Treating  Lametria Klunk/Extender: Sonya Goodwin: 20 Encounter Discharge Information Items Discharge Condition: Stable Ambulatory Status: Wheelchair Discharge Destination: Home Transportation: Private Auto Accompanied By: spouse Schedule Follow-up Appointment: Yes Clinical Summary of Care: Patient Declined Electronic Signature(s) Signed: 01/18/2021 4:58:08 PM By: Baruch Gouty RN, BSN Entered By: Baruch Gouty on 01/18/2021 12:49:22 -------------------------------------------------------------------------------- Lower Extremity Assessment Details Patient Name: Date of Service: Sonya Chambers. 01/18/2021 11:15 A M Medical Record Number: [id number] Patient Account Number: [id number] Date of Birth/Sex: Treating RN: 1941/05/08 (80 y.o. Sonya Goodwin, Sonya Goodwin Primary Care Maksym Pfiffner: Sonya Goodwin Other Clinician: Referring Laurren Lepkowski: Treating Hermione Havlicek/Extender: Sonya Goodwin: 20 Edema Assessment Assessed: [name]: Yes] [name]: Yes] Edema: [Left: Yes] [Right: Yes] Calf Left: Right: Point of Measurement: From Medial Instep 19.5 cm 22 cm Ankle Left: Right: Point of Measurement: From Medial Instep 15 cm 16.5 cm Vascular Assessment Pulses: Dorsalis Pedis Palpable: [Left:Yes] [Right:Yes] Posterior Tibial Palpable: [Left:Yes] [Right:Yes] Electronic Signature(s) Signed: 01/24/2021 5:56:51 PM By: Rhae Hammock RN Entered By: Rhae Hammock on 01/18/2021 12:00:27 -------------------------------------------------------------------------------- Multi Wound Chart Details Patient Name: Date of Service: Sonya Raider S. 01/18/2021 11:15 A M Medical Record Number: [id number] Patient Account Number: [id number] Date of Birth/Sex: Treating RN: 1941-01-31 (80 y.o. Sonya Goodwin, Tammi Klippel Primary Care Airyn Ellzey: Sonya Goodwin Other Clinician: Referring Jeanee Fabre: Treating Linsy Ehresman/Extender: Sonya Goodwin: 20 Vital Signs Height(in): 53 Pulse(bpm): 29 Weight(lbs): 141 Blood Pressure(mmHg): 126/74 Body Mass Index(BMI): 23 Temperature(F): 98.1 Respiratory Rate(breaths/min): 17 Photos: [1:No Photos Left, Anterior Lower Leg] [10:No Photos Left, Proximal, Anterior Lower Leg] [2:No Photos Left, Lateral Lower Leg] Wound Location: [1:Gradually Appeared] [10:Blister] [2:Gradually Appeared] Wounding Event: [1:Venous Leg Ulcer] [10:Diabetic Wound/Ulcer of the Lower] [2:Diabetic Wound/Ulcer of the Lower] Primary Etiology: [1:N/A] [10:Extremity N/A] [2:Extremity Venous Leg Ulcer] Secondary Etiology: [1:Cataracts, Glaucoma, Hypertension, Cataracts, Glaucoma, Hypertension,] [2:N/A]  Comorbid History: [1:Type II Diabetes, Osteoarthritis, Neuropathy 06/15/2020] [10:Type II Diabetes, Osteoarthritis, Neuropathy 01/17/2021] [2:06/15/2020] Date Acquired: [1:20] [10:0] [2:20] Weeks of Goodwin: [1:Open] [10:Open] [2:Healed - Epithelialized] Wound Status: [1:No] [10:No] [2:No] Clustered Wound: [1:0.9x0.9x0.3] [10:0.5x0.5x0.1] [2:0x0x0] Measurements L x W x D (cm) [1:0.636] [10:0.196] [2:0] A (cm) : rea [1:0.191] [10:0.02] [2:0] Volume (cm) : [1:55.00%] [10:N/A] [2:100.00%] % Reduction in A rea: [1:-35.50%] [10:N/A] [2:100.00%] % Reduction in Volume: [1:12] Starting Position 1 (o'clock): [1:12] Ending Position 1 (o'clock): [1:0.3] Maximum Distance 1 (cm): [1:Yes] [10:No] [2:N/A] Undermining: [1:Full Thickness Without Exposed] [10:Grade 2] [2:Grade 1] Classification: [1:Support Structures Small] [10:Medium] [2:N/A] Exudate Amount: [1:Serosanguineous] [10:Purulent] [2:N/A] Exudate Type: [1:red, brown] [10:yellow, brown, green] [2:N/A] Exudate Color: [1:Flat and Intact] [10:Distinct, outline attached] [2:N/A] Wound Margin: [1:Large (67-100%)] [10:None Present (0%)] [2:N/A] Granulation Amount: [1:Red] [10:N/A] [2:N/A] Granulation Quality: [1:Small (1-33%)] [10:Large (67-100%)]  [2:N/A] Necrotic Amount: [1:Fat Layer (Subcutaneous Tissue): Yes Fascia: No] [2:N/A] Exposed Structures: [1:Fascia: No Tendon: No Muscle: No Joint: No Bone: No Small (1-33%)] [10:Fat Layer (Subcutaneous Tissue): No Tendon: No Muscle: No Joint: No Bone: No None] [2:N/A] Wound Number: 5 6 8  Photos: No Photos No Photos No Photos Right, Lateral Lower Leg Left, Medial Lower Leg Left, Distal Calcaneus Wound Location: Trauma Gradually Appeared Gradually Appeared Wounding Event: Skin Tear Venous Leg Ulcer Diabetic Wound/Ulcer of the Lower Primary Etiology: Extremity N/A N/A N/A Secondary Etiology: Cataracts, Glaucoma, Hypertension, Cataracts, Glaucoma, Hypertension, Cataracts, Glaucoma, Hypertension, Comorbid History: Type II Diabetes, Osteoarthritis, Type II Diabetes, Osteoarthritis, Type II Diabetes, Osteoarthritis, Neuropathy Neuropathy Neuropathy 10/03/2020 11/07/2020 12/05/2020 Date Acquired: 14 10 6  Weeks of Goodwin: Open Open Open Wound Status: No No No Clustered Wound: 0x0x0 1x0.5x0.2 0.2x1.2x0.1 Measurements L x W x D (cm) 0 0.393 0.188 A (cm) : rea 0 0.079 0.019 Volume (cm) : 100.00% 49.00% 70.10% % Reduction in Area: 100.00% 48.70% 69.80% % Reduction in Volume: N/A No No Undermining: Full Thickness Without Exposed Full Thickness Without Exposed Grade 2 Classification: Support Structures Support Structures Small Medium Medium Exudate Amount: Serosanguineous Purulent Serosanguineous Exudate Type: red, brown yellow, brown, green red, brown Exudate Color: Flat and Intact Distinct, outline attached Distinct, outline attached Wound Margin: Medium (34-66%) None Present (0%) Small (1-33%) Granulation Amount: Red N/A Red Granulation Quality: Medium (34-66%) Large (67-100%) Large (67-100%) Necrotic Amount: Fat Layer (Subcutaneous Tissue): Yes Fascia: No Fat Layer (Subcutaneous Tissue): Yes Exposed Structures: Fascia: No Fat Layer (Subcutaneous Tissue): No  Fascia: No Tendon: No Tendon: No Tendon: No Muscle: No Muscle: No Muscle: No Joint: No Joint: No Joint: No Bone: No Bone: No Bone: No Medium (34-66%) Small (1-33%) Small (1-33%) Epithelialization: Wound Number: 9 N/A N/A Photos: No Photos N/A N/A Right Calcaneus N/A N/A Wound Location: Gradually Appeared N/A N/A Wounding Event: Diabetic Wound/Ulcer of the Lower N/A N/A Primary Etiology: Extremity N/A N/A N/A Secondary Etiology: N/A N/A N/A Comorbid History: 12/05/2020 N/A N/A Date Acquired: 6 N/A N/A Weeks of Goodwin: Healed - Epithelialized N/A N/A Wound Status: Yes N/A N/A Clustered Wound: 0x0x0 N/A N/A Measurements L x W x D (cm) 0 N/A N/A A (cm) : rea 0 N/A N/A Volume (cm) : 100.00% N/A N/A % Reduction in A rea: 100.00% N/A N/A % Reduction in Volume: N/A N/A N/A Undermining: Grade 2 N/A N/A Classification: N/A N/A N/A Exudate A mount: N/A N/A N/A Exudate Type: N/A N/A N/A Exudate Color: N/A N/A N/A Wound Margin: N/A N/A N/A Granulation A mount: N/A N/A N/A Granulation Quality: N/A N/A N/A Necrotic A mount: N/A N/A  N/A Exposed Structures: N/A N/A N/A Epithelialization: Goodwin Notes Wound #1 (Lower Leg) Wound Laterality: Left, Anterior Cleanser Normal Saline Discharge Instruction: Cleanse the wound with Normal Saline prior to applying a clean dressing using gauze sponges, not tissue or cotton balls. Peri-Wound Care Topical Primary Dressing Promogran Prisma Matrix, 4.34 (sq in) (silver collagen) Discharge Instruction: moisten with normal saline Secondary Dressing Woven Gauze Sponge, Non-Sterile 4x4 in Discharge Instruction: Apply over primary dressing as directed. ABD Pad, 5x9 Discharge Instruction: Apply over primary dressing as directed. Secured With Compression Wrap Kerlix Roll 4.5x3.1 (in/yd) Discharge Instruction: Apply Kerlix and Coban compression as directed lightly . from base of toes to patella knotch Coban  Self-Adherent Wrap 4x5 (in/yd) Discharge Instruction: Apply over Kerlix as directed.from base of toes to patella knotch Compression Stockings Add-Ons Wound #10 (Lower Leg) Wound Laterality: Left, Anterior, Proximal Cleanser Normal Saline Discharge Instruction: Cleanse the wound with Normal Saline prior to applying a clean dressing using gauze sponges, not tissue or cotton balls. Peri-Wound Care Sween Lotion (Moisturizing lotion) Discharge Instruction: Apply moisturizing lotion as directed Topical Primary Dressing KerraCel Ag Gelling Fiber Dressing, 2x2 in (silver alginate) Discharge Instruction: Apply bactroban in clinic. patient to pick up triple abx ointment and apply under the alginate Ag. Apply silver alginate to wound bed as instructed Secondary Dressing Woven Gauze Sponge, Non-Sterile 4x4 in Discharge Instruction: Apply over primary dressing as directed. Secured With Compression Wrap Kerlix Roll 4.5x3.1 (in/yd) Discharge Instruction: Apply Kerlix and Coban compression as directed. Coban Self-Adherent Wrap 4x5 (in/yd) Discharge Instruction: Apply over Kerlix as directed. Compression Stockings Add-Ons Wound #2 (Lower Leg) Wound Laterality: Left, Lateral Cleanser Peri-Wound Care Topical Primary Dressing Secondary Dressing Secured With Compression Wrap Compression Stockings Add-Ons Wound #5 (Lower Leg) Wound Laterality: Right, Lateral Cleanser Peri-Wound Care Topical Primary Dressing Secondary Dressing Secured With Compression Wrap Compression Stockings Add-Ons Wound #6 (Lower Leg) Wound Laterality: Left, Medial Cleanser Normal Saline Discharge Instruction: Cleanse the wound with Normal Saline prior to applying a clean dressing using gauze sponges, not tissue or cotton balls. Peri-Wound Care Topical Primary Dressing Promogran Prisma Matrix, 4.34 (sq in) (silver collagen) Discharge Instruction: moisten with normal saline Secondary Dressing Woven Gauze Sponge,  Non-Sterile 4x4 in Discharge Instruction: Apply over primary dressing as directed. ABD Pad, 5x9 Discharge Instruction: Apply over primary dressing as directed. Secured With Compression Wrap Kerlix Roll 4.5x3.1 (in/yd) Discharge Instruction: Apply Kerlix and Coban compression as directed.from base of toes to patella knotch Coban Self-Adherent Wrap 4x5 (in/yd) Discharge Instruction: Apply over Kerlix as directed.from base of toes to patella knotch Compression Stockings Add-Ons Wound #8 (Calcaneus) Wound Laterality: Left, Distal Cleanser Normal Saline Discharge Instruction: Cleanse the wound with Normal Saline prior to applying a clean dressing using gauze sponges, not tissue or cotton balls. Peri-Wound Care Topical Primary Dressing Promogran Prisma Matrix, 4.34 (sq in) (silver collagen) Discharge Instruction: moisten with normal saline Secondary Dressing Woven Gauze Sponge, Non-Sterile 4x4 in Discharge Instruction: Apply over primary dressing as directed. ABD Pad, 5x9 Discharge Instruction: Apply over primary dressing as directed. Secured With Compression Wrap Kerlix Roll 4.5x3.1 (in/yd) Discharge Instruction: Apply Kerlix and Coban compression as directed. from base of toes to patella knotch Coban Self-Adherent Wrap 4x5 (in/yd) Discharge Instruction: Apply over Kerlix as directed. from base of toes to patella knotch Compression Stockings Add-Ons Wound #9 (Calcaneus) Wound Laterality: Right Cleanser Peri-Wound Care Topical Primary Dressing Secondary Dressing Secured With Compression Wrap Compression Stockings Add-Ons Electronic Signature(s) Signed: 01/18/2021 4:36:20 PM By: Linton Ham MD Signed: 01/18/2021 4:56:08 PM By:  Deaton, Bobbi Entered By: Linton Ham on 01/18/2021 12:59:24 -------------------------------------------------------------------------------- Multi-Disciplinary Care Plan Details Patient Name: Date of Service: [place], [name] 01/18/2021  11:15 A M Medical Record Number: [id number] Patient Account Number: [id number] Date of Birth/Sex: Treating RN: Sep 09, 1941 (80 y.o. Sonya Goodwin, Tammi Klippel Primary Care Caidan Hubbert: Sonya Goodwin Other Clinician: Referring Zamari Bonsall: Treating Anwar Crill/Extender: Sonya Goodwin: 20 Active Inactive Wound/Skin Impairment Nursing Diagnoses: Knowledge deficit related to ulceration/compromised skin integrity Goals: Patient/caregiver will verbalize understanding of skin care regimen Date Initiated: 08/29/2020 Target Resolution Date: 02/23/2021 Goal Status: Active Ulcer/skin breakdown will have a volume reduction of 30% by week 4 Date Initiated: 08/29/2020 Date Inactivated: 09/26/2020 Target Resolution Date: 09/28/2020 Goal Status: Unmet Unmet Reason: comorbities Ulcer/skin breakdown will have a volume reduction of 50% by week 8 Date Initiated: 09/26/2020 Date Inactivated: 11/07/2020 Target Resolution Date: 10/28/2020 Goal Status: Unmet Unmet Reason: COMORBITIES Interventions: Assess patient/caregiver ability to obtain necessary supplies Assess patient/caregiver ability to perform ulcer/skin care regimen upon admission and as needed Assess ulceration(s) every visit Notes: Electronic Signature(s) Signed: 01/18/2021 4:56:08 PM By: Sonya Goodwin Entered By: Sonya Goodwin on 01/18/2021 11:47:11 -------------------------------------------------------------------------------- Pain Assessment Details Patient Name: Date of Service: [place] 01/18/2021 11:15 A M Medical Record Number: [id number] Patient Account Number: [id number] Date of Birth/Sex: Treating RN: [birth year] (80 y.o. Sonya Goodwin, Sonya Goodwin Primary Care Tristian Sickinger: Sonya Goodwin Other Clinician: Referring Keonta Alsip: Treating Darion Milewski/Extender: Sonya Goodwin: 20 Active Problems Location of Pain Severity and Description of Pain Patient Has Paino Yes Site  Locations Pain Location: Pain in Ulcers Duration of the Pain. Constant / Intermittento Intermittent Rate the pain. Current Pain Level: 5 Worst Pain Level: 10 Least Pain Level: 0 Tolerable Pain Level: 7 Character of Pain Describe the Pain: Aching Pain Management and Medication Current Pain Management: Medication: Yes Cold Application: No Rest: Yes Massage: No Activity: No T.E.N.S.: No Heat Application: No Leg drop or elevation: No Is the Current Pain Management Adequate: Adequate How does your wound impact your activities of daily livingo Sleep: No Bathing: No Appetite: No Relationship With Others: No Bladder Continence: No Emotions: No Bowel Continence: No Work: No Toileting: No Drive: No Dressing: No Hobbies: No Electronic Signature(s) Signed: 01/24/2021 5:56:51 PM By: Rhae Hammock RN Entered By: Rhae Hammock on 01/18/2021 11:37:55 -------------------------------------------------------------------------------- Patient/Caregiver Education Details Patient Name: Date of Service: Sonya Chambers 1/20/2022andnbsp11:15 A M Medical Record Number: [id number] Patient Account Number: [id number] Date of Birth/Gender: Treating RN: 1941/04/08 (80 y.o. Sonya Goodwin, Tammi Klippel Primary Care Physician: Sonya Goodwin Other Clinician: Referring Physician: Treating Physician/Extender: Sonya Goodwin: 20 Education Assessment Education Provided To: Patient Education Topics Provided Wound/Skin Impairment: Handouts: Skin Care Do's and Dont's Methods: Explain/Verbal Responses: Reinforcements needed Electronic Signature(s) Signed: 01/18/2021 4:56:08 PM By: Sonya Goodwin Entered By: Sonya Goodwin on 01/18/2021 11:47:23 -------------------------------------------------------------------------------- Wound Assessment Details Patient Name: Date of Service: Sonya Chambers. 01/18/2021 11:15 A M Medical Record Number:  [id number] Patient Account Number: [id number] Date of Birth/Sex: Treating RN: [birth year] (80 y.o. Sonya Goodwin, Sonya Goodwin Primary Care Destany Severns: Sonya Goodwin Other Clinician: Referring Beatriz Quintela: Treating Nycholas Rayner/Extender: Sonya Goodwin: 20 Wound Status Wound Number: 1 Primary Venous Leg Ulcer Etiology: Wound Location: Left, Anterior Lower Leg Wound Open Wounding Event: Gradually Appeared Status: Date Acquired: 06/15/2020 Comorbid Cataracts, Glaucoma, Hypertension, Type II Diabetes, Weeks Of Goodwin: 20 History: Osteoarthritis, Neuropathy Clustered Wound: No Photos Photo Uploaded By:  Mikeal Hawthorne on 01/19/2021 10:50:32 Wound Measurements Length: (cm) 0.9 Width: (cm) 0.9 Depth: (cm) 0.3 Area: (cm) 0.636 Volume: (cm) 0.191 % Reduction in Area: 55% % Reduction in Volume: -35.5% Epithelialization: Small (1-33%) Tunneling: No Undermining: Yes Starting Position (o'clock): 12 Ending Position (o'clock): 12 Maximum Distance: (cm) 0.3 Wound Description Classification: Full Thickness Without Exposed Support Structures Wound Margin: Flat and Intact Exudate Amount: Small Exudate Type: Serosanguineous Exudate Color: red, brown Foul Odor After Cleansing: No Slough/Fibrino Yes Wound Bed Granulation Amount: Large (67-100%) Exposed Structure Granulation Quality: Red Fascia Exposed: No Necrotic Amount: Small (1-33%) Fat Layer (Subcutaneous Tissue) Exposed: Yes Necrotic Quality: Adherent Slough Tendon Exposed: No Muscle Exposed: No Joint Exposed: No Bone Exposed: No Goodwin Notes Wound #1 (Lower Leg) Wound Laterality: Left, Anterior Cleanser Normal Saline Discharge Instruction: Cleanse the wound with Normal Saline prior to applying a clean dressing using gauze sponges, not tissue or cotton balls. Peri-Wound Care Topical Primary Dressing Promogran Prisma Matrix, 4.34 (sq in) (silver collagen) Discharge Instruction: moisten with  normal saline Secondary Dressing Woven Gauze Sponge, Non-Sterile 4x4 in Discharge Instruction: Apply over primary dressing as directed. ABD Pad, 5x9 Discharge Instruction: Apply over primary dressing as directed. Secured With Compression Wrap Kerlix Roll 4.5x3.1 (in/yd) Discharge Instruction: Apply Kerlix and Coban compression as directed lightly . from base of toes to patella knotch Coban Self-Adherent Wrap 4x5 (in/yd) Discharge Instruction: Apply over Kerlix as directed.from base of toes to patella knotch Compression Stockings Add-Ons Electronic Signature(s) Signed: 01/24/2021 5:56:51 PM By: Rhae Hammock RN Entered By: Rhae Hammock on 01/18/2021 12:03:30 -------------------------------------------------------------------------------- Wound Assessment Details Patient Name: Date of Service: Sonya Chambers. 01/18/2021 11:15 A M Medical Record Number: [id number] Patient Account Number: [id number] Date of Birth/Sex: Treating RN: Jan 28, 1941 (80 y.o. Sonya Goodwin, Sonya Goodwin Primary Care Venessa Wickham: Sonya Goodwin Other Clinician: Referring Johnthomas Lader: Treating Ason Heslin/Extender: Sonya Goodwin: 20 Wound Status Wound Number: 10 Primary Diabetic Wound/Ulcer of the Lower Extremity Etiology: Wound Location: Left, Proximal, Anterior Lower Leg Wound Open Wounding Event: Blister Status: Date Acquired: 01/17/2021 Date Acquired: 01/17/2021 Comorbid Cataracts, Glaucoma, Hypertension, Type II Diabetes, Weeks Of Goodwin: 0 History: Osteoarthritis, Neuropathy Clustered Wound: No Photos Photo Uploaded By: Mikeal Hawthorne on 01/19/2021 10:50:00 Wound Measurements Length: (cm) 0.5 Width: (cm) 0.5 Depth: (cm) 0.1 Area: (cm) 0.196 Volume: (cm) 0.02 % Reduction in Area: % Reduction in Volume: Epithelialization: None Tunneling: No Undermining: No Wound Description Classification: Grade 2 Wound Margin: Distinct, outline attached Exudate  Amount: Medium Exudate Type: Purulent Exudate Color: yellow, brown, green Foul Odor After Cleansing: No Slough/Fibrino Yes Wound Bed Granulation Amount: None Present (0%) Exposed Structure Necrotic Amount: Large (67-100%) Fascia Exposed: No Necrotic Quality: Adherent Slough Fat Layer (Subcutaneous Tissue) Exposed: No Tendon Exposed: No Muscle Exposed: No Joint Exposed: No Bone Exposed: No Goodwin Notes Wound #10 (Lower Leg) Wound Laterality: Left, Anterior, Proximal Cleanser Normal Saline Discharge Instruction: Cleanse the wound with Normal Saline prior to applying a clean dressing using gauze sponges, not tissue or cotton balls. Peri-Wound Care Sween Lotion (Moisturizing lotion) Discharge Instruction: Apply moisturizing lotion as directed Topical Primary Dressing KerraCel Ag Gelling Fiber Dressing, 2x2 in (silver alginate) Discharge Instruction: Apply bactroban in clinic. patient to pick up triple abx ointment and apply under the alginate Ag. Apply silver alginate to wound bed as instructed Secondary Dressing Woven Gauze Sponge, Non-Sterile 4x4 in Discharge Instruction: Apply over primary dressing as directed. Secured With Compression Wrap Kerlix Roll 4.5x3.1 (in/yd) Discharge Instruction: Apply Kerlix and Coban compression  as directed. Coban Self-Adherent Wrap 4x5 (in/yd) Discharge Instruction: Apply over Kerlix as directed. Compression Stockings Add-Ons Electronic Signature(s) Signed: 01/24/2021 5:56:51 PM By: Rhae Hammock RN Entered By: Rhae Hammock on 01/18/2021 12:02:06 -------------------------------------------------------------------------------- Wound Assessment Details Patient Name: Date of Service: Sonya Chambers. 01/18/2021 11:15 A M Medical Record Number: [id number] Patient Account Number: [id number] Date of Birth/Sex: Treating RN: [birth year] (80 y.o. Sonya Goodwin, Sonya Goodwin Primary Care Hazely Sealey: Sonya Goodwin Other Clinician: Referring  Shameika Speelman: Treating Nasim Garofano/Extender: Sonya Goodwin: 20 Wound Status Wound Number: 2 Primary Etiology: Diabetic Wound/Ulcer of the Lower Extremity Wound Location: Left, Lateral Lower Leg Secondary Etiology: Venous Leg Ulcer Wounding Event: Gradually Appeared Wound Status: Healed - Epithelialized Date Acquired: 06/15/2020 Weeks Of Goodwin: 20 Clustered Wound: No Photos Photo Uploaded By: Mikeal Hawthorne on 01/19/2021 10:50:15 Wound Measurements Length: (cm) 0 Width: (cm) 0 Depth: (cm) 0 Area: (cm) 0 Volume: (cm) 0 % Reduction in Area: 100% % Reduction in Volume: 100% Wound Description Classification: Grade 1 Goodwin Notes Wound #2 (Lower Leg) Wound Laterality: Left, Lateral Cleanser Peri-Wound Care Topical Primary Dressing Secondary Dressing Secured With Compression Wrap Compression Stockings Add-Ons Electronic Signature(s) Signed: 01/24/2021 5:56:51 PM By: Rhae Hammock RN Entered By: Rhae Hammock on 01/18/2021 12:03:47 -------------------------------------------------------------------------------- Wound Assessment Details Patient Name: Date of Service: Sonya Raider S. 01/18/2021 11:15 A M Medical Record Number: [id number] Patient Account Number: [id number] Date of Birth/Sex: Treating RN: [birth year] (80 y.o. Sonya Goodwin, Sonya Goodwin Primary Care Tildon Silveria: Sonya Goodwin Other Clinician: Referring Amoreena Neubert: Treating Sohaib Vereen/Extender: Sonya Goodwin: 20 Wound Status Wound Number: 5 Primary Skin Tear Etiology: Wound Location: Right, Lateral Lower Leg Wound Open Wounding Event: Trauma Status: Date Acquired: 10/03/2020 Comorbid Cataracts, Glaucoma, Hypertension, Type II Diabetes, Weeks Of Goodwin: 14 History: Osteoarthritis, Neuropathy Clustered Wound: No Wound Measurements Length: (cm) Width: (cm) Depth: (cm) Area: (cm) Volume: (cm) 0 % Reduction in Area:  100% 0 % Reduction in Volume: 100% 0 Epithelialization: Medium (34-66%) 0 0 Wound Description Classification: Full Thickness Without Exposed Support Structures Wound Margin: Flat and Intact Exudate Amount: Small Exudate Type: Serosanguineous Exudate Color: red, brown Foul Odor After Cleansing: No Slough/Fibrino No Wound Bed Granulation Amount: Medium (34-66%) Exposed Structure Granulation Quality: Red Fascia Exposed: No Necrotic Amount: Medium (34-66%) Fat Layer (Subcutaneous Tissue) Exposed: Yes Necrotic Quality: Adherent Slough Tendon Exposed: No Muscle Exposed: No Joint Exposed: No Bone Exposed: No Electronic Signature(s) Signed: 01/24/2021 5:56:51 PM By: Rhae Hammock RN Entered By: Rhae Hammock on 01/18/2021 12:04:09 -------------------------------------------------------------------------------- Wound Assessment Details Patient Name: Date of Service: Sonya Raider S. 01/18/2021 11:15 A M Medical Record Number: [id number] Patient Account Number: [id number] Date of Birth/Sex: Treating RN: 1941/04/07 (79 y.o. Sonya Goodwin, Sonya Goodwin Primary Care Lucius Wise: Sonya Goodwin Other Clinician: Referring Nakyra Bourn: Treating Sinahi Knights/Extender: Sonya Goodwin: 20 Wound Status Wound Number: 6 Primary Venous Leg Ulcer Etiology: Wound Location: Left, Medial Lower Leg Wound Open Wounding Event: Gradually Appeared Status: Date Acquired: 11/07/2020 Comorbid Cataracts, Glaucoma, Hypertension, Type II Diabetes, Weeks Of Goodwin: 10 History: Osteoarthritis, Neuropathy Clustered Wound: No Photos Photo Uploaded By: Mikeal Hawthorne on 01/19/2021 10:50:43 Wound Measurements Length: (cm) 1 Width: (cm) 0.5 Depth: (cm) 0.2 Area: (cm) 0.393 Volume: (cm) 0.079 % Reduction in Area: 49% % Reduction in Volume: 48.7% Epithelialization: Small (1-33%) Tunneling: No Undermining: No Wound Description Classification: Full Thickness  Without Exposed Support Structures Wound Margin: Distinct, outline attached Exudate Amount: Medium Exudate Type: Purulent Exudate  Color: yellow, brown, green Foul Odor After Cleansing: No Slough/Fibrino No Wound Bed Granulation Amount: None Present (0%) Exposed Structure Necrotic Amount: Large (67-100%) Fascia Exposed: No Necrotic Quality: Adherent Slough Fat Layer (Subcutaneous Tissue) Exposed: No Tendon Exposed: No Muscle Exposed: No Joint Exposed: No Bone Exposed: No Goodwin Notes Wound #6 (Lower Leg) Wound Laterality: Left, Medial Cleanser Normal Saline Discharge Instruction: Cleanse the wound with Normal Saline prior to applying a clean dressing using gauze sponges, not tissue or cotton balls. Peri-Wound Care Topical Primary Dressing Promogran Prisma Matrix, 4.34 (sq in) (silver collagen) Discharge Instruction: moisten with normal saline Secondary Dressing Woven Gauze Sponge, Non-Sterile 4x4 in Discharge Instruction: Apply over primary dressing as directed. ABD Pad, 5x9 Discharge Instruction: Apply over primary dressing as directed. Secured With Compression Wrap Kerlix Roll 4.5x3.1 (in/yd) Discharge Instruction: Apply Kerlix and Coban compression as directed.from base of toes to patella knotch Coban Self-Adherent Wrap 4x5 (in/yd) Discharge Instruction: Apply over Kerlix as directed.from base of toes to patella knotch Compression Stockings Add-Ons Electronic Signature(s) Signed: 01/24/2021 5:56:51 PM By: Rhae Hammock RN Entered By: Rhae Hammock on 01/18/2021 12:04:50 -------------------------------------------------------------------------------- Wound Assessment Details Patient Name: Date of Service: Sonya Chambers. 01/18/2021 11:15 A M Medical Record Number: [id number] Patient Account Number: [id number] Date of Birth/Sex: Treating RN: [birth year] (80 y.o. Sonya Goodwin, Sonya Goodwin Primary Care Demarkis Gheen: Sonya Goodwin Other Clinician: Referring  Ysidro Ramsay: Treating Elvera Almario/Extender: Sonya Goodwin: 20 Wound Status Wound Number: 8 Primary Diabetic Wound/Ulcer of the Lower Extremity Etiology: Wound Location: Left, Distal Calcaneus Wound Open Wounding Event: Gradually Appeared Status: Date Acquired: 12/05/2020 Comorbid Cataracts, Glaucoma, Hypertension, Type II Diabetes, Weeks Of Goodwin: 6 History: Osteoarthritis, Neuropathy Clustered Wound: No Photos Photo Uploaded By: Mikeal Hawthorne on 01/19/2021 10:51:07 Wound Measurements Length: (cm) 0.2 Width: (cm) 1.2 Depth: (cm) 0.1 Area: (cm) 0.188 Volume: (cm) 0.019 % Reduction in Area: 70.1% % Reduction in Volume: 69.8% Epithelialization: Small (1-33%) Tunneling: No Undermining: No Wound Description Classification: Grade 2 Wound Margin: Distinct, outline attached Exudate Amount: Medium Exudate Type: Serosanguineous Exudate Color: red, brown Foul Odor After Cleansing: No Slough/Fibrino Yes Wound Bed Granulation Amount: Small (1-33%) Exposed Structure Granulation Quality: Red Fascia Exposed: No Necrotic Amount: Large (67-100%) Fat Layer (Subcutaneous Tissue) Exposed: Yes Necrotic Quality: Adherent Slough Tendon Exposed: No Muscle Exposed: No Joint Exposed: No Bone Exposed: No Goodwin Notes Wound #8 (Calcaneus) Wound Laterality: Left, Distal Cleanser Normal Saline Discharge Instruction: Cleanse the wound with Normal Saline prior to applying a clean dressing using gauze sponges, not tissue or cotton balls. Peri-Wound Care Topical Primary Dressing Promogran Prisma Matrix, 4.34 (sq in) (silver collagen) Discharge Instruction: moisten with normal saline Secondary Dressing Woven Gauze Sponge, Non-Sterile 4x4 in Discharge Instruction: Apply over primary dressing as directed. ABD Pad, 5x9 Discharge Instruction: Apply over primary dressing as directed. Secured With Compression Wrap Kerlix Roll 4.5x3.1 (in/yd) Discharge  Instruction: Apply Kerlix and Coban compression as directed. from base of toes to patella knotch Coban Self-Adherent Wrap 4x5 (in/yd) Discharge Instruction: Apply over Kerlix as directed. from base of toes to patella knotch Compression Stockings Add-Ons Electronic Signature(s) Signed: 01/24/2021 5:56:51 PM By: Rhae Hammock RN Entered By: Rhae Hammock on 01/18/2021 12:00:57 -------------------------------------------------------------------------------- Wound Assessment Details Patient Name: Date of Service: Sonya Chambers. 01/18/2021 11:15 A M Medical Record Number: [id number] Patient Account Number: [id number] Date of Birth/Sex: Treating RN: September 15, 1941 (80 y.o. Sonya Goodwin, Sonya Goodwin Primary Care Haydon Kalmar: Sonya Goodwin Other Clinician: Referring Kynzee Devinney: Treating Shayanna Thatch/Extender: Tressa Busman  L Weeks in Goodwin: 20 Wound Status Wound Number: 9 Primary Etiology: Diabetic Wound/Ulcer of the Lower Extremity Wound Location: Right Calcaneus Wound Status: Healed - Epithelialized Wounding Event: Gradually Appeared Date Acquired: 12/05/2020 Weeks Of Goodwin: 6 Clustered Wound: Yes Wound Measurements Length: (cm) Width: (cm) Depth: (cm) Area: (cm) Volume: (cm) 0 % Reduction in Area: 100% 0 % Reduction in Volume: 100% 0 0 0 Wound Description Classification: Grade 2 Goodwin Notes Wound #9 (Calcaneus) Wound Laterality: Right Cleanser Peri-Wound Care Topical Primary Dressing Secondary Dressing Secured With Compression Wrap Compression Stockings Add-Ons Electronic Signature(s) Signed: 01/24/2021 5:56:51 PM By: Rhae Hammock RN Entered By: Rhae Hammock on 01/18/2021 12:05:24 -------------------------------------------------------------------------------- Vitals Details Patient Name: Date of Service: Sonya Raider S. 01/18/2021 11:15 A M Medical Record Number: [id number] Patient Account Number: [id number] Date of  Birth/Sex: Treating RN: 04-16-1941 (80 y.o. Sonya Goodwin, Sonya Goodwin Primary Care Simara Rhyner: Sonya Goodwin Other Clinician: Referring Bryer Gottsch: Treating Raelie Lohr/Extender: Sonya Goodwin: 20 Vital Signs Time Taken: 11:37 Temperature (F): 98.1 Height (in): 66 Pulse (bpm): 66 Weight (lbs): 141 Respiratory Rate (breaths/min): 17 Body Mass Index (BMI): 22.8 Blood Pressure (mmHg): 126/74 Reference Range: 80 - 120 mg / dl Electronic Signature(s) Signed: 01/24/2021 5:56:51 PM By: Rhae Hammock RN Entered By: Rhae Hammock on 01/18/2021 11:37:23

## 2021-01-25 DIAGNOSIS — E1122 Type 2 diabetes mellitus with diabetic chronic kidney disease: Secondary | ICD-10-CM | POA: Diagnosis not present

## 2021-01-25 DIAGNOSIS — L97812 Non-pressure chronic ulcer of other part of right lower leg with fat layer exposed: Secondary | ICD-10-CM | POA: Diagnosis not present

## 2021-01-25 DIAGNOSIS — N1831 Chronic kidney disease, stage 3a: Secondary | ICD-10-CM | POA: Diagnosis not present

## 2021-01-25 DIAGNOSIS — I129 Hypertensive chronic kidney disease with stage 1 through stage 4 chronic kidney disease, or unspecified chronic kidney disease: Secondary | ICD-10-CM | POA: Diagnosis not present

## 2021-01-25 DIAGNOSIS — E1143 Type 2 diabetes mellitus with diabetic autonomic (poly)neuropathy: Secondary | ICD-10-CM | POA: Diagnosis not present

## 2021-01-25 DIAGNOSIS — I872 Venous insufficiency (chronic) (peripheral): Secondary | ICD-10-CM | POA: Diagnosis not present

## 2021-01-25 DIAGNOSIS — E1142 Type 2 diabetes mellitus with diabetic polyneuropathy: Secondary | ICD-10-CM | POA: Diagnosis not present

## 2021-01-25 DIAGNOSIS — M1710 Unilateral primary osteoarthritis, unspecified knee: Secondary | ICD-10-CM | POA: Diagnosis not present

## 2021-01-25 DIAGNOSIS — D631 Anemia in chronic kidney disease: Secondary | ICD-10-CM | POA: Diagnosis not present

## 2021-01-25 DIAGNOSIS — L97822 Non-pressure chronic ulcer of other part of left lower leg with fat layer exposed: Secondary | ICD-10-CM | POA: Diagnosis not present

## 2021-01-26 DIAGNOSIS — E1142 Type 2 diabetes mellitus with diabetic polyneuropathy: Secondary | ICD-10-CM | POA: Diagnosis not present

## 2021-01-26 DIAGNOSIS — I10 Essential (primary) hypertension: Secondary | ICD-10-CM | POA: Diagnosis not present

## 2021-01-26 DIAGNOSIS — K219 Gastro-esophageal reflux disease without esophagitis: Secondary | ICD-10-CM | POA: Diagnosis not present

## 2021-01-26 DIAGNOSIS — M1990 Unspecified osteoarthritis, unspecified site: Secondary | ICD-10-CM | POA: Diagnosis not present

## 2021-01-26 DIAGNOSIS — M810 Age-related osteoporosis without current pathological fracture: Secondary | ICD-10-CM | POA: Diagnosis not present

## 2021-01-26 DIAGNOSIS — M8580 Other specified disorders of bone density and structure, unspecified site: Secondary | ICD-10-CM | POA: Diagnosis not present

## 2021-01-26 DIAGNOSIS — H409 Unspecified glaucoma: Secondary | ICD-10-CM | POA: Diagnosis not present

## 2021-01-26 DIAGNOSIS — G8929 Other chronic pain: Secondary | ICD-10-CM | POA: Diagnosis not present

## 2021-01-26 DIAGNOSIS — E782 Mixed hyperlipidemia: Secondary | ICD-10-CM | POA: Diagnosis not present

## 2021-01-26 DIAGNOSIS — E1122 Type 2 diabetes mellitus with diabetic chronic kidney disease: Secondary | ICD-10-CM | POA: Diagnosis not present

## 2021-01-29 DIAGNOSIS — E1143 Type 2 diabetes mellitus with diabetic autonomic (poly)neuropathy: Secondary | ICD-10-CM | POA: Diagnosis not present

## 2021-01-29 DIAGNOSIS — M1710 Unilateral primary osteoarthritis, unspecified knee: Secondary | ICD-10-CM | POA: Diagnosis not present

## 2021-01-29 DIAGNOSIS — N1831 Chronic kidney disease, stage 3a: Secondary | ICD-10-CM | POA: Diagnosis not present

## 2021-01-29 DIAGNOSIS — I872 Venous insufficiency (chronic) (peripheral): Secondary | ICD-10-CM | POA: Diagnosis not present

## 2021-01-29 DIAGNOSIS — I129 Hypertensive chronic kidney disease with stage 1 through stage 4 chronic kidney disease, or unspecified chronic kidney disease: Secondary | ICD-10-CM | POA: Diagnosis not present

## 2021-01-29 DIAGNOSIS — E1122 Type 2 diabetes mellitus with diabetic chronic kidney disease: Secondary | ICD-10-CM | POA: Diagnosis not present

## 2021-01-29 DIAGNOSIS — D631 Anemia in chronic kidney disease: Secondary | ICD-10-CM | POA: Diagnosis not present

## 2021-01-29 DIAGNOSIS — L97812 Non-pressure chronic ulcer of other part of right lower leg with fat layer exposed: Secondary | ICD-10-CM | POA: Diagnosis not present

## 2021-01-29 DIAGNOSIS — E1142 Type 2 diabetes mellitus with diabetic polyneuropathy: Secondary | ICD-10-CM | POA: Diagnosis not present

## 2021-01-29 DIAGNOSIS — L97822 Non-pressure chronic ulcer of other part of left lower leg with fat layer exposed: Secondary | ICD-10-CM | POA: Diagnosis not present

## 2021-02-01 DIAGNOSIS — L97812 Non-pressure chronic ulcer of other part of right lower leg with fat layer exposed: Secondary | ICD-10-CM | POA: Diagnosis not present

## 2021-02-01 DIAGNOSIS — M1710 Unilateral primary osteoarthritis, unspecified knee: Secondary | ICD-10-CM | POA: Diagnosis not present

## 2021-02-01 DIAGNOSIS — E1142 Type 2 diabetes mellitus with diabetic polyneuropathy: Secondary | ICD-10-CM | POA: Diagnosis not present

## 2021-02-01 DIAGNOSIS — I872 Venous insufficiency (chronic) (peripheral): Secondary | ICD-10-CM | POA: Diagnosis not present

## 2021-02-01 DIAGNOSIS — E1143 Type 2 diabetes mellitus with diabetic autonomic (poly)neuropathy: Secondary | ICD-10-CM | POA: Diagnosis not present

## 2021-02-01 DIAGNOSIS — I129 Hypertensive chronic kidney disease with stage 1 through stage 4 chronic kidney disease, or unspecified chronic kidney disease: Secondary | ICD-10-CM | POA: Diagnosis not present

## 2021-02-01 DIAGNOSIS — D631 Anemia in chronic kidney disease: Secondary | ICD-10-CM | POA: Diagnosis not present

## 2021-02-01 DIAGNOSIS — N1831 Chronic kidney disease, stage 3a: Secondary | ICD-10-CM | POA: Diagnosis not present

## 2021-02-01 DIAGNOSIS — L97822 Non-pressure chronic ulcer of other part of left lower leg with fat layer exposed: Secondary | ICD-10-CM | POA: Diagnosis not present

## 2021-02-01 DIAGNOSIS — E1122 Type 2 diabetes mellitus with diabetic chronic kidney disease: Secondary | ICD-10-CM | POA: Diagnosis not present

## 2021-02-05 DIAGNOSIS — N1831 Chronic kidney disease, stage 3a: Secondary | ICD-10-CM | POA: Diagnosis not present

## 2021-02-05 DIAGNOSIS — E1143 Type 2 diabetes mellitus with diabetic autonomic (poly)neuropathy: Secondary | ICD-10-CM | POA: Diagnosis not present

## 2021-02-05 DIAGNOSIS — E1142 Type 2 diabetes mellitus with diabetic polyneuropathy: Secondary | ICD-10-CM | POA: Diagnosis not present

## 2021-02-05 DIAGNOSIS — I129 Hypertensive chronic kidney disease with stage 1 through stage 4 chronic kidney disease, or unspecified chronic kidney disease: Secondary | ICD-10-CM | POA: Diagnosis not present

## 2021-02-05 DIAGNOSIS — I872 Venous insufficiency (chronic) (peripheral): Secondary | ICD-10-CM | POA: Diagnosis not present

## 2021-02-05 DIAGNOSIS — L97812 Non-pressure chronic ulcer of other part of right lower leg with fat layer exposed: Secondary | ICD-10-CM | POA: Diagnosis not present

## 2021-02-05 DIAGNOSIS — L97822 Non-pressure chronic ulcer of other part of left lower leg with fat layer exposed: Secondary | ICD-10-CM | POA: Diagnosis not present

## 2021-02-05 DIAGNOSIS — M1710 Unilateral primary osteoarthritis, unspecified knee: Secondary | ICD-10-CM | POA: Diagnosis not present

## 2021-02-05 DIAGNOSIS — E1122 Type 2 diabetes mellitus with diabetic chronic kidney disease: Secondary | ICD-10-CM | POA: Diagnosis not present

## 2021-02-05 DIAGNOSIS — D631 Anemia in chronic kidney disease: Secondary | ICD-10-CM | POA: Diagnosis not present

## 2021-02-06 ENCOUNTER — Other Ambulatory Visit: Payer: Self-pay

## 2021-02-06 ENCOUNTER — Encounter (HOSPITAL_BASED_OUTPATIENT_CLINIC_OR_DEPARTMENT_OTHER): Payer: Medicare HMO | Attending: Internal Medicine | Admitting: Internal Medicine

## 2021-02-06 DIAGNOSIS — L97822 Non-pressure chronic ulcer of other part of left lower leg with fat layer exposed: Secondary | ICD-10-CM | POA: Diagnosis not present

## 2021-02-06 DIAGNOSIS — Z92241 Personal history of systemic steroid therapy: Secondary | ICD-10-CM | POA: Insufficient documentation

## 2021-02-06 DIAGNOSIS — L97422 Non-pressure chronic ulcer of left heel and midfoot with fat layer exposed: Secondary | ICD-10-CM | POA: Diagnosis not present

## 2021-02-06 DIAGNOSIS — E1122 Type 2 diabetes mellitus with diabetic chronic kidney disease: Secondary | ICD-10-CM | POA: Diagnosis not present

## 2021-02-06 DIAGNOSIS — L97819 Non-pressure chronic ulcer of other part of right lower leg with unspecified severity: Secondary | ICD-10-CM | POA: Insufficient documentation

## 2021-02-06 DIAGNOSIS — L97821 Non-pressure chronic ulcer of other part of left lower leg limited to breakdown of skin: Secondary | ICD-10-CM | POA: Diagnosis not present

## 2021-02-06 DIAGNOSIS — I872 Venous insufficiency (chronic) (peripheral): Secondary | ICD-10-CM | POA: Diagnosis not present

## 2021-02-06 DIAGNOSIS — L97812 Non-pressure chronic ulcer of other part of right lower leg with fat layer exposed: Secondary | ICD-10-CM | POA: Diagnosis not present

## 2021-02-06 DIAGNOSIS — L97222 Non-pressure chronic ulcer of left calf with fat layer exposed: Secondary | ICD-10-CM | POA: Diagnosis not present

## 2021-02-06 NOTE — Progress Notes (Signed)
Sonya Goodwin, Sonya Goodwin ([id number]) Visit Report for 02/06/2021 Arrival Information Details Patient Name: Date of Service: [place], [name] 02/06/2021 12:30 PM Medical Record Number: [id number] Patient Account Number: [id number] Date of Birth/Sex: Treating RN: Dec 21, 1941 (80 y.o. Martyn Malay, Vaughan Basta Primary Care Provider: Nicholes Stairs Other Clinician: Referring Provider: Treating Provider/Extender: Nyra Jabs in Treatment: 23 Visit Information History Since Last Visit Added or deleted any medications: No Patient Arrived: Wheel Chair Any new allergies or adverse reactions: No Arrival Time: 12:45 Had a fall or experienced change in No Accompanied By: spouse activities of daily living that may affect Transfer Assistance: None risk of falls: Patient Identification Verified: Yes Signs or symptoms of abuse/neglect since last visito No Secondary Verification Process Completed: Yes Hospitalized since last visit: No Patient Requires Transmission-Based Precautions: No Implantable device outside of the clinic excluding No Patient Has Alerts: No cellular tissue based products placed in the center since last visit: Has Dressing in Place as Prescribed: Yes Has Compression in Place as Prescribed: Yes Pain Present Now: No Electronic Signature(s) Signed: 02/06/2021 5:13:50 PM By: Baruch Gouty RN, BSN Entered By: Baruch Gouty on 02/06/2021 12:46:40 -------------------------------------------------------------------------------- Clinic Level of Care Assessment Details Patient Name: Date of Service: [place], [name] 02/06/2021 12:30 PM Medical Record Number: [id number] Patient Account Number: [id number] Date of Birth/Sex: Treating RN: 10/23/1941 (80 y.o. Tonita Phoenix, Lauren Primary Care Provider: Nicholes Stairs Other Clinician: Referring Provider: Treating Provider/Extender: Nyra Jabs in Treatment: 23 Clinic Level of Care  Assessment Items TOOL 4 Quantity Score X- 1 0 Use when only an EandM is performed on FOLLOW-UP visit ASSESSMENTS - Nursing Assessment / Reassessment X- 1 10 Reassessment of Co-morbidities (includes updates in patient status) X- 1 5 Reassessment of Adherence to Treatment Plan ASSESSMENTS - Wound and Skin A ssessment / Reassessment []  - 0 Simple Wound Assessment / Reassessment - one wound X- 5 5 Complex Wound Assessment / Reassessment - multiple wounds []  - 0 Dermatologic / Skin Assessment (not related to wound area) ASSESSMENTS - Focused Assessment X- 1 5 Circumferential Edema Measurements - multi extremities []  - 0 Nutritional Assessment / Counseling / Intervention []  - 0 Lower Extremity Assessment (monofilament, tuning fork, pulses) []  - 0 Peripheral Arterial Disease Assessment (using hand held doppler) ASSESSMENTS - Ostomy and/or Continence Assessment and Care []  - 0 Incontinence Assessment and Management []  - 0 Ostomy Care Assessment and Management (repouching, etc.) PROCESS - Coordination of Care []  - 0 Simple Patient / Family Education for ongoing care X- 1 20 Complex (extensive) Patient / Family Education for ongoing care X- 1 10 Staff obtains Programmer, systems, Records, T Results / Process Orders est X- 1 10 Staff telephones HHA, Nursing Homes / Clarify orders / etc []  - 0 Routine Transfer to another Facility (non-emergent condition) []  - 0 Routine Hospital Admission (non-emergent condition) []  - 0 New Admissions / Biomedical engineer / Ordering NPWT Apligraf, etc. , []  - 0 Emergency Hospital Admission (emergent condition) X- 1 10 Simple Discharge Coordination []  - 0 Complex (extensive) Discharge Coordination PROCESS - Special Needs []  - 0 Pediatric / Minor Patient Management []  - 0 Isolation Patient Management []  - 0 Hearing / Language / Visual special needs []  - 0 Assessment of Community assistance (transportation, D/C planning, etc.) []  -  0 Additional assistance / Altered mentation []  - 0 Support Surface(s) Assessment (bed, cushion, seat, etc.) INTERVENTIONS - Wound Cleansing / Measurement []  - 0 Simple Wound Cleansing - one wound  X- 5 5 Complex Wound Cleansing - multiple wounds []  - 0 Wound Imaging (photographs - any number of wounds) []  - 0 Wound Tracing (instead of photographs) []  - 0 Simple Wound Measurement - one wound X- 5 5 Complex Wound Measurement - multiple wounds INTERVENTIONS - Wound Dressings []  - 0 Small Wound Dressing one or multiple wounds X- 5 15 Medium Wound Dressing one or multiple wounds []  - 0 Large Wound Dressing one or multiple wounds []  - 0 Application of Medications - topical []  - 0 Application of Medications - injection INTERVENTIONS - Miscellaneous []  - 0 External ear exam []  - 0 Specimen Collection (cultures, biopsies, blood, body fluids, etc.) []  - 0 Specimen(s) / Culture(s) sent or taken to Lab for analysis []  - 0 Patient Transfer (multiple staff / Civil Service fast streamer / Similar devices) []  - 0 Simple Staple / Suture removal (25 or less) []  - 0 Complex Staple / Suture removal (26 or more) []  - 0 Hypo / Hyperglycemic Management (close monitor of Blood Glucose) []  - 0 Ankle / Brachial Index (ABI) - do not check if billed separately X- 1 5 Vital Signs Has the patient been seen at the hospital within the last three years: Yes Total Score: 225 Level Of Care: New/Established - Level 5 Electronic Signature(s) Signed: 02/06/2021 5:05:22 PM By: Rhae Hammock RN Entered By: Rhae Hammock on 02/06/2021 13:47:32 -------------------------------------------------------------------------------- Encounter Discharge Information Details Patient Name: Date of Service: Sonya Chambers. 02/06/2021 12:30 PM Medical Record Number: [id number] Patient Account Number: [id number] Date of Birth/Sex: Treating RN: 09-Dec-1941 (80 y.o. [name] Primary Care Provider: Nicholes Stairs  Other Clinician: Referring Provider: Treating Provider/Extender: Nyra Jabs in Treatment: 23 Encounter Discharge Information Items Discharge Condition: Stable Ambulatory Status: Wheelchair Discharge Destination: Home Transportation: Private Auto Accompanied By: husband Schedule Follow-up Appointment: Yes Clinical Summary of Care: Patient Declined Electronic Signature(s) Signed: 02/06/2021 5:04:56 PM By: Levan Hurst RN, BSN Entered By: Levan Hurst on 02/06/2021 14:50:42 -------------------------------------------------------------------------------- Lower Extremity Assessment Details Patient Name: Date of Service: Sonya Chambers. 02/06/2021 12:30 PM Medical Record Number: [id number] Patient Account Number: [id number] Date of Birth/Sex: Treating RN: 12-Sep-1941 (80 y.o. [name] Primary Care Provider: Nicholes Stairs Other Clinician: Referring Provider: Treating Provider/Extender: Nyra Jabs in Treatment: 23 Edema Assessment Assessed: [name]: No] [name]: No] Edema: [Left: Yes] [Right: Yes] Calf Left: Right: Point of Measurement: From Medial Instep 20.5 cm 22.5 cm Ankle Left: Right: Point of Measurement: From Medial Instep 15.2 cm 17.4 cm Vascular Assessment Pulses: Dorsalis Pedis Palpable: [Left:Yes] [Right:Yes] Electronic Signature(s) Signed: 02/06/2021 5:13:50 PM By: Baruch Gouty RN, BSN Entered By: Baruch Gouty on 02/06/2021 13:01:24 -------------------------------------------------------------------------------- Multi Wound Chart Details Patient Name: Date of Service: Sonya Chambers. 02/06/2021 12:30 PM Medical Record Number: [id number] Patient Account Number: [id number] Date of Birth/Sex: Treating RN: 1941/08/31 (80 y.o. Tonita Phoenix, Lauren Primary Care Provider: Nicholes Stairs Other Clinician: Referring Provider: Treating Provider/Extender: Nyra Jabs in  Treatment: 23 Vital Signs Height(in): 24 Pulse(bpm): 74 Weight(lbs): 141 Blood Pressure(mmHg): 159/83 Body Mass Index(BMI): 23 Temperature(F): 98.6 Respiratory Rate(breaths/min): 18 Photos: [1:No Photos Left, Anterior Lower Leg] [10:No Photos Left, Proximal, Anterior Lower Leg] [11:No Photos Left, Posterior Lower Leg] Wound Location: [1:Gradually Appeared] [10:Blister] [11:Gradually Appeared] Wounding Event: [1:Venous Leg Ulcer] [10:Diabetic Wound/Ulcer of the Lower] [11:Venous Leg Ulcer] Primary Etiology: [1:N/A] [10:Extremity N/A] [11:N/A] Secondary Etiology: [1:Cataracts, Glaucoma, Hypertension, Cataracts, Glaucoma, Hypertension, Cataracts, Glaucoma, Hypertension,] Comorbid History: [  1:Type II Diabetes, Osteoarthritis, Neuropathy 06/15/2020] [10:Type II Diabetes, Osteoarthritis, Neuropathy 01/17/2021] [11:Type II Diabetes, Osteoarthritis, Neuropathy 02/03/2021] Date Acquired: [1:23] [10:2] [11:0] Weeks of Treatment: [1:Open] [10:Open] [11:Open] Wound Status: [1:0.9x0.8x0.2] [10:0.4x0.3x0.1] [11:1.1x0.4x0.1] Measurements L x W x D (cm) [1:0.565] [10:0.094] [11:0.346] A (cm) : rea [1:0.113] [10:0.009] [11:0.035] Volume (cm) : [1:60.00%] [10:52.00%] [11:N/A] % Reduction in A rea: [1:19.90%] [10:55.00%] [11:N/A] % Reduction in Volume: [1:11] [11:12] Starting Position 1 (o'clock): [1:6] [11:12] Ending Position 1 (o'clock): [1:0.6] [11:2.1] Maximum Distance 1 (cm): [1:Yes] [10:No] [11:Yes] Undermining: [1:Full Thickness Without Exposed] [10:Grade 2] [11:Full Thickness Without Exposed] Classification: [1:Support Structures Small] [10:Small] [11:Support Structures Medium] Exudate Amount: [1:Serosanguineous] [10:Purulent] [11:Sanguinous] Exudate Type: [1:red, brown] [10:yellow, brown, green] [11:red] Exudate Color: [1:Flat and Intact] [10:Distinct, outline attached] [11:Well defined, not attached] Wound Margin: [1:Medium (34-66%)] [10:Large (67-100%)] [11:Large (67-100%)] Granulation  Amount: [1:Red] [10:Pink] [11:Red] Granulation Quality: [1:Medium (34-66%)] [10:Small (1-33%)] [11:None Present (0%)] Necrotic Amount: [1:Fat Layer (Subcutaneous Tissue): Yes Fat Layer (Subcutaneous Tissue): Yes Fat Layer (Subcutaneous Tissue): Yes] Exposed Structures: [1:Fascia: No Tendon: No Muscle: No Joint: No Bone: No Small (1-33%)] [10:Fascia: No Tendon: No Muscle: No Joint: No Bone: No None] [11:Fascia: No Tendon: No Muscle: No Joint: No Bone: No Small (1-33%)] Wound Number: 12 6 8  Photos: No Photos No Photos No Photos Right, Lateral Lower Leg Left, Medial Lower Leg Left, Distal Calcaneus Wound Location: Gradually Appeared Gradually Appeared Gradually Appeared Wounding Event: Venous Leg Ulcer Venous Leg Ulcer Diabetic Wound/Ulcer of the Lower Primary Etiology: Extremity Diabetic Wound/Ulcer of the Lower N/A N/A Secondary Etiology: Extremity Cataracts, Glaucoma, Hypertension, Cataracts, Glaucoma, Hypertension, Cataracts, Glaucoma, Hypertension, Comorbid History: Type II Diabetes, Osteoarthritis, Type II Diabetes, Osteoarthritis, Type II Diabetes, Osteoarthritis, Neuropathy Neuropathy Neuropathy 02/06/2021 11/07/2020 12/05/2020 Date Acquired: 0 13 9 Weeks of Treatment: Open Open Open Wound Status: 0.5x0.4x0.1 0.8x0.4x0.2 0.1x1.4x0.1 Measurements L x W x D (cm) 0.157 0.251 0.11 A (cm) : rea 0.016 0.05 0.011 Volume (cm) : N/A 67.40% 82.50% % Reduction in Area: N/A 67.50% 82.50% % Reduction in Volume: No No No Undermining: Full Thickness Without Exposed Full Thickness Without Exposed Grade 2 Classification: Support Structures Support Structures Small Small None Present Exudate Amount: Purulent Serosanguineous N/A Exudate Type: yellow, brown, green red, brown N/A Exudate Color: Distinct, outline attached Distinct, outline attached Distinct, outline attached Wound Margin: Large (67-100%) Large (67-100%) Small (1-33%) Granulation Amount: Pink Pink Red,  Pink Granulation Quality: Small (1-33%) Small (1-33%) None Present (0%) Necrotic Amount: Fat Layer (Subcutaneous Tissue): Yes Fat Layer (Subcutaneous Tissue): Yes Fat Layer (Subcutaneous Tissue): Yes Exposed Structures: Fascia: No Fascia: No Fascia: No Tendon: No Tendon: No Tendon: No Muscle: No Muscle: No Muscle: No Joint: No Joint: No Joint: No Bone: No Bone: No Bone: No None Small (1-33%) Medium (34-66%) Epithelialization: Treatment Notes Electronic Signature(s) Signed: 02/06/2021 4:37:05 PM By: Linton Ham MD Signed: 02/06/2021 5:05:22 PM By: Rhae Hammock RN Entered By: Linton Ham on 02/06/2021 13:52:43 -------------------------------------------------------------------------------- Multi-Disciplinary Care Plan Details Patient Name: Date of Service: Sonya Chambers. 02/06/2021 12:30 PM Medical Record Number: [id number] Patient Account Number: [id number] Date of Birth/Sex: Treating RN: [birth year] (80 y.o. Tonita Phoenix, Lauren Primary Care Provider: Nicholes Stairs Other Clinician: Referring Provider: Treating Provider/Extender: Nyra Jabs in Treatment: 23 Active Inactive Wound/Skin Impairment Nursing Diagnoses: Knowledge deficit related to ulceration/compromised skin integrity Goals: Patient/caregiver will verbalize understanding of skin care regimen Date Initiated: 08/29/2020 Target Resolution Date: 03/31/2021 Goal Status: Active Ulcer/skin breakdown will have a volume reduction of 30% by week  4 Date Initiated: 08/29/2020 Date Inactivated: 09/26/2020 Target Resolution Date: 09/28/2020 Goal Status: Unmet Unmet Reason: comorbities Ulcer/skin breakdown will have a volume reduction of 50% by week 8 Date Initiated: 09/26/2020 Date Inactivated: 11/07/2020 Target Resolution Date: 10/28/2020 Goal Status: Unmet Unmet Reason: COMORBITIES Interventions: Assess patient/caregiver ability to obtain necessary supplies Assess  patient/caregiver ability to perform ulcer/skin care regimen upon admission and as needed Assess ulceration(s) every visit Notes: Electronic Signature(s) Signed: 02/06/2021 5:05:22 PM By: Rhae Hammock RN Entered By: Rhae Hammock on 02/06/2021 13:41:49 -------------------------------------------------------------------------------- Pain Assessment Details Patient Name: Date of Service: Sonya Chambers. 02/06/2021 12:30 PM Medical Record Number: [id number] Patient Account Number: [id number] Date of Birth/Sex: Treating RN: 01-07-1941 (80 y.o. [name] Primary Care Provider: Nicholes Stairs Other Clinician: Referring Provider: Treating Provider/Extender: Nyra Jabs in Treatment: 23 Active Problems Location of Pain Severity and Description of Pain Patient Has Paino No Site Locations Rate the pain. Current Pain Level: 0 Pain Management and Medication Current Pain Management: Electronic Signature(s) Signed: 02/06/2021 5:13:50 PM By: Baruch Gouty RN, BSN Entered By: Baruch Gouty on 02/06/2021 12:48:02 -------------------------------------------------------------------------------- Patient/Caregiver Education Details Patient Name: Date of Service: Sonya Chambers 2/8/2022andnbsp12:30 PM Medical Record Number: [id number] Patient Account Number: [id number] Date of Birth/Gender: Treating RN: Jan 09, 1941 (80 y.o. Tonita Phoenix, Lauren Primary Care Physician: Nicholes Stairs Other Clinician: Referring Physician: Treating Physician/Extender: Nyra Jabs in Treatment: 65 Education Assessment Education Provided To: Patient Education Topics Provided Wound/Skin Impairment: Methods: Explain/Verbal Responses: State content correctly [employer]) Signed: 02/06/2021 5:05:22 PM By: Rhae Hammock RN Entered By: Rhae Hammock on 02/06/2021  13:46:02 -------------------------------------------------------------------------------- Wound Assessment Details Patient Name: Date of Service: Sonya Chambers. 02/06/2021 12:30 PM Medical Record Number: [id number] Patient Account Number: [id number] Date of Birth/Sex: Treating RN: 28-Oct-1941 (80 y.o. [name] Primary Care Provider: Nicholes Stairs Other Clinician: Referring Provider: Treating Provider/Extender: Nyra Jabs in Treatment: 23 Wound Status Wound Number: 1 Primary Venous Leg Ulcer Etiology: Wound Location: Left, Anterior Lower Leg Wound Open Wounding Event: Gradually Appeared Status: Date Acquired: 06/15/2020 Comorbid Cataracts, Glaucoma, Hypertension, Type II Diabetes, Weeks Of Treatment: 23 History: Osteoarthritis, Neuropathy Clustered Wound: No Wound Measurements Length: (cm) 0.9 Width: (cm) 0.8 Depth: (cm) 0.2 Area: (cm) 0.565 Volume: (cm) 0.113 % Reduction in Area: 60% % Reduction in Volume: 19.9% Epithelialization: Small (1-33%) Tunneling: No Undermining: Yes Starting Position (o'clock): 11 Ending Position (o'clock): 6 Maximum Distance: (cm) 0.6 Wound Description Classification: Full Thickness Without Exposed Support Structures Wound Margin: Flat and Intact Exudate Amount: Small Exudate Type: Serosanguineous Exudate Color: red, brown Foul Odor After Cleansing: No Slough/Fibrino Yes Wound Bed Granulation Amount: Medium (34-66%) Exposed Structure Granulation Quality: Red Fascia Exposed: No Necrotic Amount: Medium (34-66%) Fat Layer (Subcutaneous Tissue) Exposed: Yes Necrotic Quality: Adherent Slough Tendon Exposed: No Muscle Exposed: No Joint Exposed: No Bone Exposed: No Treatment Notes Wound #1 (Lower Leg) Wound Laterality: Left, Anterior Cleanser Soap and Water Discharge Instruction: May shower and wash wound with dial antibacterial soap and water prior to dressing change. Wound  Cleanser Discharge Instruction: Cleanse the wound with wound cleanser prior to applying a clean dressing using gauze sponges, not tissue or cotton balls. Normal Saline Discharge Instruction: Cleanse the wound with Normal Saline prior to applying a clean dressing using gauze sponges, not tissue or cotton balls. Peri-Wound Care Topical Primary Dressing Promogran Prisma Matrix, 4.34 (sq in) (silver collagen) Discharge Instruction: moisten with normal saline Secondary Dressing  Woven Gauze Sponge, Non-Sterile 4x4 in Discharge Instruction: Apply over primary dressing as directed. ABD Pad, 5x9 Discharge Instruction: Apply over primary dressing as directed. Secured With Compression Wrap Kerlix Roll 4.5x3.1 (in/yd) Discharge Instruction: Apply Kerlix and Coban compression as directed lightly . from base of toes to patella knotch Coban Self-Adherent Wrap 4x5 (in/yd) Discharge Instruction: Apply over Kerlix as directed.from base of toes to patella knotch Compression Stockings Add-Ons Electronic Signature(s) Signed: 02/06/2021 5:13:50 PM By: Baruch Gouty RN, BSN Entered By: Baruch Gouty on 02/06/2021 13:15:10 -------------------------------------------------------------------------------- Wound Assessment Details Patient Name: Date of Service: Sonya Chambers. 02/06/2021 12:30 PM Medical Record Number: [id number] Patient Account Number: [id number] Date of Birth/Sex: Treating RN: 07-26-1941 (80 y.o. [name] Primary Care Provider: Nicholes Stairs Other Clinician: Referring Provider: Treating Provider/Extender: Nyra Jabs in Treatment: 23 Wound Status Wound Number: 10 Primary Diabetic Wound/Ulcer of the Lower Extremity Etiology: Wound Location: Left, Proximal, Anterior Lower Leg Wound Open Wounding Event: Blister Status: Date Acquired: 01/17/2021 Comorbid Cataracts, Glaucoma, Hypertension, Type II Diabetes, Weeks Of Treatment: 2 History:  Osteoarthritis, Neuropathy Clustered Wound: No Wound Measurements Length: (cm) 0.4 Width: (cm) 0.3 Depth: (cm) 0.1 Area: (cm) 0.094 Volume: (cm) 0.009 % Reduction in Area: 52% % Reduction in Volume: 55% Epithelialization: None Tunneling: No Undermining: No Wound Description Classification: Grade 2 Wound Margin: Distinct, outline attached Exudate Amount: Small Exudate Type: Purulent Exudate Color: yellow, brown, green Foul Odor After Cleansing: No Slough/Fibrino Yes Wound Bed Granulation Amount: Large (67-100%) Exposed Structure Granulation Quality: Pink Fascia Exposed: No Necrotic Amount: Small (1-33%) Fat Layer (Subcutaneous Tissue) Exposed: Yes Necrotic Quality: Adherent Slough Tendon Exposed: No Muscle Exposed: No Joint Exposed: No Bone Exposed: No Treatment Notes Wound #10 (Lower Leg) Wound Laterality: Left, Anterior, Proximal Cleanser Soap and Water Discharge Instruction: May shower and wash wound with dial antibacterial soap and water prior to dressing change. Wound Cleanser Discharge Instruction: Cleanse the wound with wound cleanser prior to applying a clean dressing using gauze sponges, not tissue or cotton balls. Normal Saline Discharge Instruction: Cleanse the wound with Normal Saline prior to applying a clean dressing using gauze sponges, not tissue or cotton balls. Peri-Wound Care Topical Primary Dressing Promogran Prisma Matrix, 4.34 (sq in) (silver collagen) Discharge Instruction: moisten with normal saline Secondary Dressing Woven Gauze Sponge, Non-Sterile 4x4 in Discharge Instruction: Apply over primary dressing as directed. ABD Pad, 5x9 Discharge Instruction: Apply over primary dressing as directed. Secured With Compression Wrap Kerlix Roll 4.5x3.1 (in/yd) Discharge Instruction: Apply Kerlix and Coban compression as directed lightly . from base of toes to patella knotch Coban Self-Adherent Wrap 4x5 (in/yd) Discharge Instruction: Apply over  Kerlix as directed.from base of toes to patella knotch Compression Stockings Add-Ons Electronic Signature(s) Signed: 02/06/2021 5:13:50 PM By: Baruch Gouty RN, BSN Entered By: Baruch Gouty on 02/06/2021 13:15:33 -------------------------------------------------------------------------------- Wound Assessment Details Patient Name: Date of Service: Sonya Chambers. 02/06/2021 12:30 PM Medical Record Number: [id number] Patient Account Number: [id number] Date of Birth/Sex: Treating RN: Jan 14, 1941 (80 y.o. [name] Primary Care Provider: Nicholes Stairs Other Clinician: Referring Provider: Treating Provider/Extender: Nyra Jabs in Treatment: 23 Wound Status Wound Number: 11 Primary Venous Leg Ulcer Etiology: Wound Location: Left, Posterior Lower Leg Wound Open Wounding Event: Gradually Appeared Status: Date Acquired: 02/03/2021 Comorbid Cataracts, Glaucoma, Hypertension, Type II Diabetes, Weeks Of Treatment: 0 History: Osteoarthritis, Neuropathy Clustered Wound: No Wound Measurements Length: (cm) 1.1 Width: (cm) 0.4 Depth: (cm) 0.1 Area: (cm) 0.346 Volume: (  cm) 0.035 % Reduction in Area: % Reduction in Volume: Epithelialization: Small (1-33%) Tunneling: No Undermining: Yes Starting Position (o'clock): 12 Ending Position (o'clock): 12 Maximum Distance: (cm) 2.1 Wound Description Classification: Full Thickness Without Exposed Support Structures Wound Margin: Well defined, not attached Exudate Amount: Medium Exudate Type: Sanguinous Exudate Color: red Foul Odor After Cleansing: No Slough/Fibrino No Wound Bed Granulation Amount: Large (67-100%) Exposed Structure Granulation Quality: Red Fascia Exposed: No Necrotic Amount: None Present (0%) Fat Layer (Subcutaneous Tissue) Exposed: Yes Tendon Exposed: No Muscle Exposed: No Joint Exposed: No Bone Exposed: No Treatment Notes Wound #11 (Lower Leg) Wound Laterality: Left,  Posterior Cleanser Soap and Water Discharge Instruction: May shower and wash wound with dial antibacterial soap and water prior to dressing change. Wound Cleanser Discharge Instruction: Cleanse the wound with wound cleanser prior to applying a clean dressing using gauze sponges, not tissue or cotton balls. Normal Saline Discharge Instruction: Cleanse the wound with Normal Saline prior to applying a clean dressing using gauze sponges, not tissue or cotton balls. Peri-Wound Care Topical Primary Dressing Promogran Prisma Matrix, 4.34 (sq in) (silver collagen) Discharge Instruction: moisten with normal saline Secondary Dressing Woven Gauze Sponge, Non-Sterile 4x4 in Discharge Instruction: Apply over primary dressing as directed. ABD Pad, 5x9 Discharge Instruction: Apply over primary dressing as directed. Secured With Compression Wrap Kerlix Roll 4.5x3.1 (in/yd) Discharge Instruction: Apply Kerlix and Coban compression as directed lightly . from base of toes to patella knotch Coban Self-Adherent Wrap 4x5 (in/yd) Discharge Instruction: Apply over Kerlix as directed.from base of toes to patella knotch Compression Stockings Add-Ons Electronic Signature(s) Signed: 02/06/2021 5:13:50 PM By: Baruch Gouty RN, BSN Entered By: Baruch Gouty on 02/06/2021 13:07:24 -------------------------------------------------------------------------------- Wound Assessment Details Patient Name: Date of Service: Sonya Chambers. 02/06/2021 12:30 PM Medical Record Number: [id number] Patient Account Number: [id number] Date of Birth/Sex: Treating RN: [birth year] (80 y.o. [name] Primary Care Provider: Nicholes Stairs Other Clinician: Referring Provider: Treating Provider/Extender: Nyra Jabs in Treatment: 23 Wound Status Wound Number: 12 Primary Venous Leg Ulcer Etiology: Wound Location: Right, Lateral Lower Leg Secondary Diabetic Wound/Ulcer of the Lower  Extremity Wounding Event: Gradually Appeared Etiology: Date Acquired: 02/06/2021 Wound Status: Open Weeks Of Treatment: 0 Comorbid Cataracts, Glaucoma, Hypertension, Type II Diabetes, Clustered Wound: No History: Osteoarthritis, Neuropathy Wound Measurements Length: (cm) 0.5 Width: (cm) 0.4 Depth: (cm) 0.1 Area: (cm) 0.157 Volume: (cm) 0.016 % Reduction in Area: % Reduction in Volume: Epithelialization: None Tunneling: No Undermining: No Wound Description Classification: Full Thickness Without Exposed Support Structures Wound Margin: Distinct, outline attached Exudate Amount: Small Exudate Type: Purulent Exudate Color: yellow, brown, green Foul Odor After Cleansing: No Slough/Fibrino Yes Wound Bed Granulation Amount: Large (67-100%) Exposed Structure Granulation Quality: Pink Fascia Exposed: No Necrotic Amount: Small (1-33%) Fat Layer (Subcutaneous Tissue) Exposed: Yes Necrotic Quality: Adherent Slough Tendon Exposed: No Muscle Exposed: No Joint Exposed: No Bone Exposed: No Treatment Notes Wound #12 (Lower Leg) Wound Laterality: Right, Lateral Cleanser Soap and Water Discharge Instruction: May shower and wash wound with dial antibacterial soap and water prior to dressing change. Wound Cleanser Discharge Instruction: Cleanse the wound with wound cleanser prior to applying a clean dressing using gauze sponges, not tissue or cotton balls. Normal Saline Discharge Instruction: Cleanse the wound with Normal Saline prior to applying a clean dressing using gauze sponges, not tissue or cotton balls. Peri-Wound Care Topical Primary Dressing Promogran Prisma Matrix, 4.34 (sq in) (silver collagen) Discharge Instruction: moisten with normal saline Secondary Dressing Woven  Gauze Sponge, Non-Sterile 4x4 in Discharge Instruction: Apply over primary dressing as directed. ABD Pad, 5x9 Discharge Instruction: Apply over primary dressing as directed. Secured With Compression  Wrap Kerlix Roll 4.5x3.1 (in/yd) Discharge Instruction: Apply Kerlix and Coban compression as directed lightly . from base of toes to patella knotch Coban Self-Adherent Wrap 4x5 (in/yd) Discharge Instruction: Apply over Kerlix as directed.from base of toes to patella knotch Compression Stockings Add-Ons Electronic Signature(s) Signed: 02/06/2021 5:13:50 PM By: Baruch Gouty RN, BSN Entered By: Baruch Gouty on 02/06/2021 13:17:48 -------------------------------------------------------------------------------- Wound Assessment Details Patient Name: Date of Service: Sonya Chambers. 02/06/2021 12:30 PM Medical Record Number: [id number] Patient Account Number: [id number] Date of Birth/Sex: Treating RN: Jul 27, 1941 (80 y.o. [name] Primary Care Provider: Nicholes Stairs Other Clinician: Referring Provider: Treating Provider/Extender: Nyra Jabs in Treatment: 23 Wound Status Wound Number: 6 Primary Venous Leg Ulcer Etiology: Wound Location: Left, Medial Lower Leg Wound Open Wounding Event: Gradually Appeared Status: Date Acquired: 11/07/2020 Comorbid Cataracts, Glaucoma, Hypertension, Type II Diabetes, Weeks Of Treatment: 13 History: Osteoarthritis, Neuropathy Clustered Wound: No Wound Measurements Length: (cm) 0.8 Width: (cm) 0.4 Depth: (cm) 0.2 Area: (cm) 0.251 Volume: (cm) 0.05 % Reduction in Area: 67.4% % Reduction in Volume: 67.5% Epithelialization: Small (1-33%) Tunneling: No Undermining: No Wound Description Classification: Full Thickness Without Exposed Support Structures Wound Margin: Distinct, outline attached Exudate Amount: Small Exudate Type: Serosanguineous Exudate Color: red, brown Foul Odor After Cleansing: No Slough/Fibrino No Wound Bed Granulation Amount: Large (67-100%) Exposed Structure Granulation Quality: Pink Fascia Exposed: No Necrotic Amount: Small (1-33%) Fat Layer (Subcutaneous Tissue) Exposed:  Yes Necrotic Quality: Adherent Slough Tendon Exposed: No Muscle Exposed: No Joint Exposed: No Bone Exposed: No Treatment Notes Wound #6 (Lower Leg) Wound Laterality: Left, Medial Cleanser Soap and Water Discharge Instruction: May shower and wash wound with dial antibacterial soap and water prior to dressing change. Wound Cleanser Discharge Instruction: Cleanse the wound with wound cleanser prior to applying a clean dressing using gauze sponges, not tissue or cotton balls. Normal Saline Discharge Instruction: Cleanse the wound with Normal Saline prior to applying a clean dressing using gauze sponges, not tissue or cotton balls. Peri-Wound Care Topical Primary Dressing Promogran Prisma Matrix, 4.34 (sq in) (silver collagen) Discharge Instruction: moisten with normal saline Secondary Dressing Woven Gauze Sponge, Non-Sterile 4x4 in Discharge Instruction: Apply over primary dressing as directed. ABD Pad, 5x9 Discharge Instruction: Apply over primary dressing as directed. Secured With Compression Wrap Kerlix Roll 4.5x3.1 (in/yd) Discharge Instruction: Apply Kerlix and Coban compression as directed lightly . from base of toes to patella knotch Coban Self-Adherent Wrap 4x5 (in/yd) Discharge Instruction: Apply over Kerlix as directed.from base of toes to patella knotch Compression Stockings Add-Ons Electronic Signature(s) Signed: 02/06/2021 5:13:50 PM By: Baruch Gouty RN, BSN Entered By: Baruch Gouty on 02/06/2021 13:15:56 -------------------------------------------------------------------------------- Wound Assessment Details Patient Name: Date of Service: Sonya Chambers. 02/06/2021 12:30 PM Medical Record Number: [id number] Patient Account Number: [id number] Date of Birth/Sex: Treating RN: 1941-02-28 (80 y.o. [name] Primary Care Provider: Nicholes Stairs Other Clinician: Referring Provider: Treating Provider/Extender: Nyra Jabs  in Treatment: 23 Wound Status Wound Number: 8 Primary Diabetic Wound/Ulcer of the Lower Extremity Etiology: Wound Location: Left, Distal Calcaneus Wound Open Wounding Event: Gradually Appeared Status: Date Acquired: 12/05/2020 Comorbid Cataracts, Glaucoma, Hypertension, Type II Diabetes, Weeks Of Treatment: 9 History: Osteoarthritis, Neuropathy Clustered Wound: No Wound Measurements Length: (cm) 0.1 Width: (cm) 1.4 Depth: (cm) 0.1 Area: (cm)  0.11 Volume: (cm) 0.011 % Reduction in Area: 82.5% % Reduction in Volume: 82.5% Epithelialization: Medium (34-66%) Tunneling: No Undermining: No Wound Description Classification: Grade 2 Wound Margin: Distinct, outline attached Exudate Amount: None Present Foul Odor After Cleansing: No Slough/Fibrino No Wound Bed Granulation Amount: Small (1-33%) Exposed Structure Granulation Quality: Red, Pink Fascia Exposed: No Necrotic Amount: None Present (0%) Fat Layer (Subcutaneous Tissue) Exposed: Yes Tendon Exposed: No Muscle Exposed: No Joint Exposed: No Bone Exposed: No Treatment Notes Wound #8 (Calcaneus) Wound Laterality: Left, Distal Cleanser Soap and Water Discharge Instruction: May shower and wash wound with dial antibacterial soap and water prior to dressing change. Wound Cleanser Discharge Instruction: Cleanse the wound with wound cleanser prior to applying a clean dressing using gauze sponges, not tissue or cotton balls. Normal Saline Discharge Instruction: Cleanse the wound with Normal Saline prior to applying a clean dressing using gauze sponges, not tissue or cotton balls. Peri-Wound Care Topical Primary Dressing Promogran Prisma Matrix, 4.34 (sq in) (silver collagen) Discharge Instruction: moisten with normal saline Secondary Dressing Woven Gauze Sponge, Non-Sterile 4x4 in Discharge Instruction: Apply over primary dressing as directed. ABD Pad, 5x9 Discharge Instruction: Apply over primary dressing as  directed. Secured With Compression Wrap Kerlix Roll 4.5x3.1 (in/yd) Discharge Instruction: Apply Kerlix and Coban compression as directed lightly . from base of toes to patella knotch Coban Self-Adherent Wrap 4x5 (in/yd) Discharge Instruction: Apply over Kerlix as directed.from base of toes to patella knotch Compression Stockings Add-Ons Electronic Signature(s) Signed: 02/06/2021 5:13:50 PM By: Baruch Gouty RN, BSN Entered By: Baruch Gouty on 02/06/2021 13:16:26 -------------------------------------------------------------------------------- Vitals Details Patient Name: Date of Service: Sonya Raider S. 02/06/2021 12:30 PM Medical Record Number: [id number] Patient Account Number: [id number] Date of Birth/Sex: Treating RN: 23-Jan-1941 (80 y.o. [name] Primary Care Provider: Nicholes Stairs Other Clinician: Referring Provider: Treating Provider/Extender: Nyra Jabs in Treatment: 23 Vital Signs Time Taken: 12:46 Temperature (F): 98.6 Height (in): 66 Pulse (bpm): 90 Source: Stated Respiratory Rate (breaths/min): 18 Weight (lbs): 141 Blood Pressure (mmHg): 159/83 Source: Stated Reference Range: 80 - 120 mg / dl Body Mass Index (BMI): 22.8 Electronic Signature(s) Signed: 02/06/2021 5:13:50 PM By: Baruch Gouty RN, BSN Entered By: Baruch Gouty on 02/06/2021 12:47:43

## 2021-02-06 NOTE — Progress Notes (Signed)
Sonya Goodwin, Sonya Goodwin ([id number]) Visit Report for 02/06/2021 HPI Details Patient Name: Date of Service: [place], [name] 02/06/2021 12:30 PM Medical Record Number: [id number] Patient Account Number: [id number] Date of Birth/Sex: Treating RN: 28-Dec-1941 (80 y.o. Benjaman Lobe Primary Care Provider: Nicholes Stairs Other Clinician: Referring Provider: Treating Provider/Extender: Nyra Jabs in Treatment: 23 History of Present Illness HPI Description: We think they have been using silver alginateADMISSION 08/29/2020 This is a 80 year old woman who has wounds on her left lower leg mid aspect. She states that these have been present since June when she was hospitalized with a UTI, delirium possibly medication issues. I do not see any mention of the wounds on her left leg at that time. In any case these develop sometime after this. The patient is followed by Dr. Jacelyn Grip at [place] at [facility]. She has been using bacitracin ointment to the wounds. She has [facility] home health although I am not exactly sure what they are doing. Her husband is angry about a bill from home health again we were not able to help him exactly. The patient has chronic sarcoidosis and adrenal insufficiency she has been on longstanding prednisone. She has all the classic skin features of chronic steroid use. I think this is the primary issue for these wounds. They have also noted weeping fluid coming out of the wounds and even weeping draining fluid on the right leg although we were not able to define any wound on the right leg. She could very well have a component of chronic venous insufficiency as well. Past medical history includes sarcoidosis, type 2 diabetes with a recent hemoglobin A1c of 6.3, diabetic neuropathy, stage III P chronic renal failure, hypertension, adrenal insufficiency. ABI on the right noncompressible on the left at 1.02 9/14 2 small punched out areas of the left  anterior mid tibia. We have been using Iodoflex. Better looking wound surfaces 9/28; the 2 areas on the left anterior mid tibia are about the same. She has a new weeping area on the right side just medial to the tibia. I think all of these wounds are weeping edema fluid from a collection of fluid in the upper third of her lower leg. The skin distally is more fibrosed and there is no room for weeping edema fluid. I wanted to put her in 3 layer compression versus kerlix and Coban but she will not agree to it 10/12; 2-week follow-up. She has the 2 areas on the left anterior mid tibia. There is scissor injury now on the left medial calf. In a single area on the right medial lower extremity. All of these are roughly the same small punched out surfaces. Weeping edema fluid. She does not tolerate anything more than kerlix Coban and even with this she finds this too tight often making her husband cut these off. I had ordered Iodoflex to these wounds but according to her husband that is not what home health is using. 11/9; patient missed her last appointment so she has not been seen here in almost a month. She has the 2 punched-out areas on the left mid tibia 1 medial and 1 lateral a small excoriation a little more superior and lateral also on the left but she has also punched out areas on the right that she did not have last time I am not exactly sure how this happened. She complains about pain in her feet at night especially in the morning. Not really claudication but I was  I was concerned. Her edema is satisfactorily concerned and the kerlix and Coban 12/7; again a monthly follow-up. I am not exactly sure how we got into this monthly pattern. She does have home health coming out 3 times a week. She did manage to get the arterial studies done that I ordered last time. On the right her ABI was noncompressible although she had triphasic waveforms at the dorsalis pedis her great toe pressure was unobtainable.  On the left she had triphasic waveforms at the PTA and dorsalis pedis but again absent great toe waveforms. Segmental waveform analysis showed suggestion of posterior tibial artery occlusion on the right but a biphasic triphasic waveform with a normal normal examination on the left. The patient describes a lot of pain it is not clearly claudication but certainly there is a possibility here. She also has what I think is longstanding Raynaud's dating back to when she was a teenager She has new wounds on the left Achilles heel on the right Achilles heel from last time otherwise her wounds are about the same 1/4; the patient has an appointment with vascular on 01/09/2021. Basically I am asking if there is macrovascular issues that need to be addressed she still complains of a lot of pain in her feet and heels when she walks although I am not sure this is claudication. She also has longstanding Raynaud's by her description dating back to when she was a teenager. She has small punched-out areas on the left anterior lower x2, left anterior leg left heel and right heel. She tells me that the home health nurse stopped wrapping the right leg about 2 weeks ago there is a lot of edema in this leg threatening to break open. Small wound on the right lateral leg. 1/20; the patient's appointment with vascular had to be delayed. She has no open wounds on the right leg on the left she has an area on the left Achilles heel, left medial calf 2 areas anteriorly. All of these small punched out areas. 2/8; patient's vascular appointment is next week this is in consultation with regards to the lower extremity blood supply. She has 2 new wounds one on the right lateral and one on the left posterior. The 3 wounds on the left anterior are about the same. We have been using silver collagen with a kerlix and Coban wrap. Electronic Signature(s) Signed: 02/06/2021 4:37:05 PM By: Linton Ham MD Entered By: Linton Ham on  02/06/2021 13:53:50 -------------------------------------------------------------------------------- Physical Exam Details Patient Name: Date of Service: Sonya Goodwin. 02/06/2021 12:30 PM Medical Record Number: [id number] Patient Account Number: [id number] Date of Birth/Sex: Treating RN: [birth year] (80 y.o. Benjaman Lobe Primary Care Provider: Nicholes Stairs Other Clinician: Referring Provider: Treating Provider/Extender: Nyra Jabs in Treatment: 23 Constitutional Patient is hypertensive.. Pulse regular and within target range for patient.Marland Kitchen Respirations regular, non-labored and within target range.. Temperature is normal and within the target range for the patient.Marland Kitchen Appears in no distress. Cardiovascular Popliteal pulses are easily palpable. Salas pedis pulses are palpable but the posterior tibial pulses are not. Notes Wound exam; the patient has a new wound on the right lateral calf and 1 on the left posterior calf. She has 3 open areas on the left anterior and the area on the left Achilles which appears smaller paradoxically Electronic Signature(s) Signed: 02/06/2021 4:37:05 PM By: Linton Ham MD Entered By: Linton Ham on 02/06/2021 13:55:15 -------------------------------------------------------------------------------- Physician Orders Details Patient Name: Date of Service: [name], [place]  TRICIA S. 02/06/2021 12:30 PM Medical Record Number: [id number] Patient Account Number: [id number] Date of Birth/Sex: Treating RN: [birth year] (80 y.o. Tonita Phoenix, Lauren Primary Care Provider: Nicholes Stairs Other Clinician: Referring Provider: Treating Provider/Extender: Nyra Jabs in Treatment: 45 Verbal / Phone Orders: No Diagnosis Coding Follow-up Appointments Return A ppointment in 2 weeks. Other: - Please be sure to go to F/U appt. with Vascular Doctor on 02/13/2021. Bathing/ Shower/ Hygiene May shower with  protection but do not get wound dressing(s) wet. Edema Control - Lymphedema / SCD / Other Elevate legs to the level of the heart or above for 30 minutes daily and/or when sitting, a frequency of: Avoid standing for long periods of time. Exercise regularly Additional Orders / Instructions Other: - ***Please pad the left dorsal foot**** Home Health New wound care orders this week; continue Home Health for wound care. May utilize formulary equivalent dressing for wound treatment orders unless otherwise specified. [facility] . 2 times per week. Pt. has vascular appt. 02/15- home health willl need to come out that day or next day to put dressing back on as dressing will be taken off at vascular appt. Wound Treatment Wound #1 - Lower Leg Wound Laterality: Left, Anterior Cleanser: Soap and Water 2 x Per Day/30 Days Discharge Instructions: May shower and wash wound with dial antibacterial soap and water prior to dressing change. Cleanser: Wound Cleanser 2 x Per Day/30 Days Discharge Instructions: Cleanse the wound with wound cleanser prior to applying a clean dressing using gauze sponges, not tissue or cotton balls. Cleanser: Normal Saline (Home Health) (Generic) 2 x Per Day/30 Days Discharge Instructions: Cleanse the wound with Normal Saline prior to applying a clean dressing using gauze sponges, not tissue or cotton balls. Prim Dressing: Promogran Prisma Matrix, 4.34 (sq in) (silver collagen) (Home Health) (Generic) 2 x Per Day/30 Days ary Discharge Instructions: moisten with normal saline Secondary Dressing: Woven Gauze Sponge, Non-Sterile 4x4 in (Home Health) (Generic) 2 x Per Day/30 Days Discharge Instructions: Apply over primary dressing as directed. Secondary Dressing: ABD Pad, 5x9 (Home Health) 2 x Per Day/30 Days Discharge Instructions: Apply over primary dressing as directed. Compression Wrap: Kerlix Roll 4.5x3.1 (in/yd) (Home Health) (Generic) 2 x Per Day/30 Days Discharge  Instructions: Apply Kerlix and Coban compression as directed lightly . from base of toes to patella knotch Compression Wrap: Coban Self-Adherent Wrap 4x5 (in/yd) (Home Health) (Generic) 2 x Per Day/30 Days Discharge Instructions: Apply over Kerlix as directed.from base of toes to patella knotch Wound #10 - Lower Leg Wound Laterality: Left, Anterior, Proximal Cleanser: Soap and Water 2 x Per Day/30 Days Discharge Instructions: May shower and wash wound with dial antibacterial soap and water prior to dressing change. Cleanser: Wound Cleanser 2 x Per Day/30 Days Discharge Instructions: Cleanse the wound with wound cleanser prior to applying a clean dressing using gauze sponges, not tissue or cotton balls. Cleanser: Normal Saline (Home Health) (Generic) 2 x Per Day/30 Days Discharge Instructions: Cleanse the wound with Normal Saline prior to applying a clean dressing using gauze sponges, not tissue or cotton balls. Prim Dressing: Promogran Prisma Matrix, 4.34 (sq in) (silver collagen) (Home Health) (Generic) 2 x Per Day/30 Days ary Discharge Instructions: moisten with normal saline Secondary Dressing: Woven Gauze Sponge, Non-Sterile 4x4 in (Home Health) (Generic) 2 x Per Day/30 Days Discharge Instructions: Apply over primary dressing as directed. Secondary Dressing: ABD Pad, 5x9 (Home Health) 2 x Per Day/30 Days Discharge Instructions: Apply over primary dressing as  directed. Compression Wrap: Kerlix Roll 4.5x3.1 (in/yd) (Home Health) (Generic) 2 x Per Day/30 Days Discharge Instructions: Apply Kerlix and Coban compression as directed lightly . from base of toes to patella knotch Compression Wrap: Coban Self-Adherent Wrap 4x5 (in/yd) (Home Health) (Generic) 2 x Per Day/30 Days Discharge Instructions: Apply over Kerlix as directed.from base of toes to patella knotch Wound #11 - Lower Leg Wound Laterality: Left, Posterior Cleanser: Soap and Water 2 x Per Day/30 Days Discharge Instructions: May shower  and wash wound with dial antibacterial soap and water prior to dressing change. Cleanser: Wound Cleanser 2 x Per Day/30 Days Discharge Instructions: Cleanse the wound with wound cleanser prior to applying a clean dressing using gauze sponges, not tissue or cotton balls. Cleanser: Normal Saline (Home Health) (Generic) 2 x Per Day/30 Days Discharge Instructions: Cleanse the wound with Normal Saline prior to applying a clean dressing using gauze sponges, not tissue or cotton balls. Prim Dressing: Promogran Prisma Matrix, 4.34 (sq in) (silver collagen) (Home Health) (Generic) 2 x Per Day/30 Days ary Discharge Instructions: moisten with normal saline Secondary Dressing: Woven Gauze Sponge, Non-Sterile 4x4 in (Home Health) (Generic) 2 x Per Day/30 Days Discharge Instructions: Apply over primary dressing as directed. Secondary Dressing: ABD Pad, 5x9 (Home Health) 2 x Per Day/30 Days Discharge Instructions: Apply over primary dressing as directed. Compression Wrap: Kerlix Roll 4.5x3.1 (in/yd) (Home Health) (Generic) 2 x Per Day/30 Days Discharge Instructions: Apply Kerlix and Coban compression as directed lightly . from base of toes to patella knotch Compression Wrap: Coban Self-Adherent Wrap 4x5 (in/yd) (Home Health) (Generic) 2 x Per Day/30 Days Discharge Instructions: Apply over Kerlix as directed.from base of toes to patella knotch Wound #12 - Lower Leg Wound Laterality: Right, Lateral Cleanser: Soap and Water 2 x Per Day/30 Days Discharge Instructions: May shower and wash wound with dial antibacterial soap and water prior to dressing change. Cleanser: Wound Cleanser 2 x Per Day/30 Days Discharge Instructions: Cleanse the wound with wound cleanser prior to applying a clean dressing using gauze sponges, not tissue or cotton balls. Cleanser: Normal Saline (Home Health) (Generic) 2 x Per Day/30 Days Discharge Instructions: Cleanse the wound with Normal Saline prior to applying a clean dressing using  gauze sponges, not tissue or cotton balls. Prim Dressing: Promogran Prisma Matrix, 4.34 (sq in) (silver collagen) (Home Health) (Generic) 2 x Per Day/30 Days ary Discharge Instructions: moisten with normal saline Secondary Dressing: Woven Gauze Sponge, Non-Sterile 4x4 in (Home Health) (Generic) 2 x Per Day/30 Days Discharge Instructions: Apply over primary dressing as directed. Secondary Dressing: ABD Pad, 5x9 (Home Health) 2 x Per Day/30 Days Discharge Instructions: Apply over primary dressing as directed. Compression Wrap: Kerlix Roll 4.5x3.1 (in/yd) (Home Health) (Generic) 2 x Per Day/30 Days Discharge Instructions: Apply Kerlix and Coban compression as directed lightly . from base of toes to patella knotch Compression Wrap: Coban Self-Adherent Wrap 4x5 (in/yd) (Home Health) (Generic) 2 x Per Day/30 Days Discharge Instructions: Apply over Kerlix as directed.from base of toes to patella knotch Wound #6 - Lower Leg Wound Laterality: Left, Medial Cleanser: Soap and Water 2 x Per Day/30 Days Discharge Instructions: May shower and wash wound with dial antibacterial soap and water prior to dressing change. Cleanser: Wound Cleanser 2 x Per Day/30 Days Discharge Instructions: Cleanse the wound with wound cleanser prior to applying a clean dressing using gauze sponges, not tissue or cotton balls. Cleanser: Normal Saline (Home Health) (Generic) 2 x Per Day/30 Days Discharge Instructions: Cleanse the wound with Normal  Saline prior to applying a clean dressing using gauze sponges, not tissue or cotton balls. Prim Dressing: Promogran Prisma Matrix, 4.34 (sq in) (silver collagen) (Home Health) (Generic) 2 x Per Day/30 Days ary Discharge Instructions: moisten with normal saline Secondary Dressing: Woven Gauze Sponge, Non-Sterile 4x4 in (Home Health) (Generic) 2 x Per Day/30 Days Discharge Instructions: Apply over primary dressing as directed. Secondary Dressing: ABD Pad, 5x9 (Home Health) 2 x Per Day/30  Days Discharge Instructions: Apply over primary dressing as directed. Compression Wrap: Kerlix Roll 4.5x3.1 (in/yd) (Home Health) (Generic) 2 x Per Day/30 Days Discharge Instructions: Apply Kerlix and Coban compression as directed lightly . from base of toes to patella knotch Compression Wrap: Coban Self-Adherent Wrap 4x5 (in/yd) (Home Health) (Generic) 2 x Per Day/30 Days Discharge Instructions: Apply over Kerlix as directed.from base of toes to patella knotch Wound #8 - Calcaneus Wound Laterality: Left, Distal Cleanser: Soap and Water 2 x Per Day/30 Days Discharge Instructions: May shower and wash wound with dial antibacterial soap and water prior to dressing change. Cleanser: Wound Cleanser 2 x Per Day/30 Days Discharge Instructions: Cleanse the wound with wound cleanser prior to applying a clean dressing using gauze sponges, not tissue or cotton balls. Cleanser: Normal Saline (Home Health) (Generic) 2 x Per Day/30 Days Discharge Instructions: Cleanse the wound with Normal Saline prior to applying a clean dressing using gauze sponges, not tissue or cotton balls. Prim Dressing: Promogran Prisma Matrix, 4.34 (sq in) (silver collagen) (Home Health) (Generic) 2 x Per Day/30 Days ary Discharge Instructions: moisten with normal saline Secondary Dressing: Woven Gauze Sponge, Non-Sterile 4x4 in (Home Health) (Generic) 2 x Per Day/30 Days Discharge Instructions: Apply over primary dressing as directed. Secondary Dressing: ABD Pad, 5x9 (Home Health) 2 x Per Day/30 Days Discharge Instructions: Apply over primary dressing as directed. Compression Wrap: Kerlix Roll 4.5x3.1 (in/yd) (Home Health) (Generic) 2 x Per Day/30 Days Discharge Instructions: Apply Kerlix and Coban compression as directed lightly . from base of toes to patella knotch Compression Wrap: Coban Self-Adherent Wrap 4x5 (in/yd) (Home Health) (Generic) 2 x Per Day/30 Days Discharge Instructions: Apply over Kerlix as directed.from base of  toes to patella knotch Electronic Signature(s) Signed: 02/06/2021 4:37:05 PM By: Linton Ham MD Signed: 02/06/2021 5:05:22 PM By: Rhae Hammock RN Entered By: Rhae Hammock on 02/06/2021 13:43:19 -------------------------------------------------------------------------------- Problem List Details Patient Name: Date of Service: Sonya Goodwin. 02/06/2021 12:30 PM Medical Record Number: [id number] Patient Account Number: [id number] Date of Birth/Sex: Treating RN: 1941/02/07 (80 y.o. Tonita Phoenix, Lauren Primary Care Provider: Nicholes Stairs Other Clinician: Referring Provider: Treating Provider/Extender: Nyra Jabs in Treatment: 23 Active Problems ICD-10 Encounter Code Description Active Date MDM Diagnosis [phone] Non-pressure chronic ulcer of other part of left lower leg limited to breakdown 08/29/2020 No Yes of skin I87.2 Venous insufficiency (chronic) (peripheral) 08/29/2020 No Yes Z92.241 Personal history of systemic steroid therapy 08/29/2020 No Yes L97.819 Non-pressure chronic ulcer of other part of right lower leg with unspecified 09/26/2020 No Yes severity L97.428 Non-pressure chronic ulcer of left heel and midfoot with other specified 12/05/2020 No Yes severity L97.418 Non-pressure chronic ulcer of right heel and midfoot with other specified 12/05/2020 No Yes severity E11.621 Type 2 diabetes mellitus with foot ulcer 12/05/2020 No Yes Inactive Problems Resolved Problems Electronic Signature(s) Signed: 02/06/2021 4:37:05 PM By: Linton Ham MD Entered By: Linton Ham on 02/06/2021 13:52:31 -------------------------------------------------------------------------------- Progress Note Details Patient Name: Date of Service: Sonya Goodwin S. 02/06/2021 12:30 PM Medical Record Number:  [id number] Patient Account Number: [id number] Date of Birth/Sex: Treating RN: 12-20-1941 (80 y.o. Tonita Phoenix, Lauren Primary Care Provider: Other  Clinician: Nicholes Stairs Referring Provider: Treating Provider/Extender: Nyra Jabs in Treatment: 23 Subjective History of Present Illness (HPI) We think they have been using silver alginateADMISSION 08/29/2020 This is a 80 year old woman who has wounds on her left lower leg mid aspect. She states that these have been present since June when she was hospitalized with a UTI, delirium possibly medication issues. I do not see any mention of the wounds on her left leg at that time. In any case these develop sometime after this. The patient is followed by Dr. Jacelyn Grip at [place] [facility]. She has been using bacitracin ointment to the wounds. She has [facility] home health although I am not exactly sure what they are doing. Her husband is angry about a bill from home health again we were not able to help him exactly. The patient has chronic sarcoidosis and adrenal insufficiency she has been on longstanding prednisone. She has all the classic skin features of chronic steroid use. I think this is the primary issue for these wounds. They have also noted weeping fluid coming out of the wounds and even weeping draining fluid on the right leg although we were not able to define any wound on the right leg. She could very well have a component of chronic venous insufficiency as well. Past medical history includes sarcoidosis, type 2 diabetes with a recent hemoglobin A1c of 6.3, diabetic neuropathy, stage III P chronic renal failure, hypertension, adrenal insufficiency. ABI on the right noncompressible on the left at 1.02 9/14 2 small punched out areas of the left anterior mid tibia. We have been using Iodoflex. Better looking wound surfaces 9/28; the 2 areas on the left anterior mid tibia are about the same. She has a new weeping area on the right side just medial to the tibia. I think all of these wounds are weeping edema fluid from a collection of fluid in the upper third of  her lower leg. The skin distally is more fibrosed and there is no room for weeping edema fluid. I wanted to put her in 3 layer compression versus kerlix and Coban but she will not agree to it 10/12; 2-week follow-up. She has the 2 areas on the left anterior mid tibia. There is scissor injury now on the left medial calf. In a single area on the right medial lower extremity. All of these are roughly the same small punched out surfaces. Weeping edema fluid. She does not tolerate anything more than kerlix Coban and even with this she finds this too tight often making her husband cut these off. I had ordered Iodoflex to these wounds but according to her husband that is not what home health is using. 11/9; patient missed her last appointment so she has not been seen here in almost a month. She has the 2 punched-out areas on the left mid tibia 1 medial and 1 lateral a small excoriation a little more superior and lateral also on the left but she has also punched out areas on the right that she did not have last time I am not exactly sure how this happened. She complains about pain in her feet at night especially in the morning. Not really claudication but I was I was concerned. Her edema is satisfactorily concerned and the kerlix and Coban 12/7; again a monthly follow-up. I am not exactly sure how  we got into this monthly pattern. She does have home health coming out 3 times a week. She did manage to get the arterial studies done that I ordered last time. On the right her ABI was noncompressible although she had triphasic waveforms at the dorsalis pedis her great toe pressure was unobtainable. On the left she had triphasic waveforms at the PTA and dorsalis pedis but again absent great toe waveforms. Segmental waveform analysis showed suggestion of posterior tibial artery occlusion on the right but a biphasic triphasic waveform with a normal normal examination on the left. The patient describes a lot of pain  it is not clearly claudication but certainly there is a possibility here. She also has what I think is longstanding Raynaud's dating back to when she was a teenager She has new wounds on the left Achilles heel on the right Achilles heel from last time otherwise her wounds are about the same 1/4; the patient has an appointment with vascular on 01/09/2021. Basically I am asking if there is macrovascular issues that need to be addressed she still complains of a lot of pain in her feet and heels when she walks although I am not sure this is claudication. She also has longstanding Raynaud's by her description dating back to when she was a teenager. She has small punched-out areas on the left anterior lower x2, left anterior leg left heel and right heel. She tells me that the home health nurse stopped wrapping the right leg about 2 weeks ago there is a lot of edema in this leg threatening to break open. Small wound on the right lateral leg. 1/20; the patient's appointment with vascular had to be delayed. She has no open wounds on the right leg on the left she has an area on the left Achilles heel, left medial calf 2 areas anteriorly. All of these small punched out areas. 2/8; patient's vascular appointment is next week this is in consultation with regards to the lower extremity blood supply. She has 2 new wounds one on the right lateral and one on the left posterior. The 3 wounds on the left anterior are about the same. We have been using silver collagen with a kerlix and Coban wrap. Objective Constitutional Patient is hypertensive.. Pulse regular and within target range for patient.Marland Kitchen Respirations regular, non-labored and within target range.. Temperature is normal and within the target range for the patient.Marland Kitchen Appears in no distress. Vitals Time Taken: 12:46 PM, Height: 66 in, Source: Stated, Weight: 141 lbs, Source: Stated, BMI: 22.8, Temperature: 98.6 F, Pulse: 90 bpm, Respiratory Rate: 18 breaths/min,  Blood Pressure: 159/83 mmHg. Cardiovascular Popliteal pulses are easily palpable. Salas pedis pulses are palpable but the posterior tibial pulses are not. General Notes: Wound exam; the patient has a new wound on the right lateral calf and 1 on the left posterior calf. She has 3 open areas on the left anterior and the area on the left Achilles which appears smaller paradoxically Integumentary (Hair, Skin) Wound #1 status is Open. Original cause of wound was Gradually Appeared. The wound is located on the Left,Anterior Lower Leg. The wound measures 0.9cm length x 0.8cm width x 0.2cm depth; 0.565cm^2 area and 0.113cm^3 volume. There is Fat Layer (Subcutaneous Tissue) exposed. There is no tunneling noted, however, there is undermining starting at 11:00 and ending at 6:00 with a maximum distance of 0.6cm. There is a small amount of serosanguineous drainage noted. The wound margin is flat and intact. There is medium (34-66%) red granulation within  the wound bed. There is a medium (34-66%) amount of necrotic tissue within the wound bed including Adherent Slough. Wound #10 status is Open. Original cause of wound was Blister. The wound is located on the Left,Proximal,Anterior Lower Leg. The wound measures 0.4cm length x 0.3cm width x 0.1cm depth; 0.094cm^2 area and 0.009cm^3 volume. There is Fat Layer (Subcutaneous Tissue) exposed. There is no tunneling or undermining noted. There is a small amount of purulent drainage noted. The wound margin is distinct with the outline attached to the wound base. There is large (67-100%) pink granulation within the wound bed. There is a small (1-33%) amount of necrotic tissue within the wound bed including Adherent Slough. Wound #11 status is Open. Original cause of wound was Gradually Appeared. The wound is located on the Left,Posterior Lower Leg. The wound measures 1.1cm length x 0.4cm width x 0.1cm depth; 0.346cm^2 area and 0.035cm^3 volume. There is Fat Layer  (Subcutaneous Tissue) exposed. There is no tunneling noted, however, there is undermining starting at 12:00 and ending at 12:00 with a maximum distance of 2.1cm. There is a medium amount of sanguinous drainage noted. The wound margin is well defined and not attached to the wound base. There is large (67-100%) red granulation within the wound bed. There is no necrotic tissue within the wound bed. Wound #12 status is Open. Original cause of wound was Gradually Appeared. The wound is located on the Right,Lateral Lower Leg. The wound measures 0.5cm length x 0.4cm width x 0.1cm depth; 0.157cm^2 area and 0.016cm^3 volume. There is Fat Layer (Subcutaneous Tissue) exposed. There is no tunneling or undermining noted. There is a small amount of purulent drainage noted. The wound margin is distinct with the outline attached to the wound base. There is large (67-100%) pink granulation within the wound bed. There is a small (1-33%) amount of necrotic tissue within the wound bed including Adherent Slough. Wound #6 status is Open. Original cause of wound was Gradually Appeared. The wound is located on the Left,Medial Lower Leg. The wound measures 0.8cm length x 0.4cm width x 0.2cm depth; 0.251cm^2 area and 0.05cm^3 volume. There is Fat Layer (Subcutaneous Tissue) exposed. There is no tunneling or undermining noted. There is a small amount of serosanguineous drainage noted. The wound margin is distinct with the outline attached to the wound base. There is large (67-100%) pink granulation within the wound bed. There is a small (1-33%) amount of necrotic tissue within the wound bed including Adherent Slough. Wound #8 status is Open. Original cause of wound was Gradually Appeared. The wound is located on the Left,Distal Calcaneus. The wound measures 0.1cm length x 1.4cm width x 0.1cm depth; 0.11cm^2 area and 0.011cm^3 volume. There is Fat Layer (Subcutaneous Tissue) exposed. There is no tunneling or undermining noted.  There is a none present amount of drainage noted. The wound margin is distinct with the outline attached to the wound base. There is small (1-33%) red, pink granulation within the wound bed. There is no necrotic tissue within the wound bed. 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 Other: - Please  be sure to go to F/U appt. with Vascular Doctor on 02/13/2021. Bathing/ Shower/ Hygiene: May shower with protection but do not get wound dressing(s) wet. Edema Control - Lymphedema / SCD / Other: Elevate legs to the level of the heart or above for 30 minutes daily and/or when sitting, a frequency of: Avoid standing for long periods of time. Exercise regularly Additional Orders / Instructions: Other: - ***Please pad the left dorsal foot**** Home Health: New wound care orders this week; continue Home Health for wound care. May utilize formulary equivalent dressing for wound treatment orders unless otherwise specified. [facility] . 2 times per week. Pt. has vascular appt. 02/15- home health willl need to come out that day or next day to put dressing back on as dressing will be taken off at vascular appt. WOUND #1: - Lower Leg Wound Laterality: Left, Anterior Cleanser: Soap and Water 2 x Per Day/30 Days Discharge Instructions: May shower and wash wound with dial antibacterial soap and water prior to dressing change. Cleanser: Wound Cleanser 2 x Per Day/30 Days Discharge Instructions:  Cleanse the wound with wound cleanser prior to applying a clean dressing using gauze sponges, not tissue or cotton balls. Cleanser: Normal Saline (Home Health) (Generic) 2 x Per Day/30 Days Discharge Instructions: Cleanse the wound with Normal Saline prior to applying a clean dressing using gauze sponges, not tissue or cotton balls. Prim Dressing: Promogran Prisma Matrix, 4.34 (sq in) (silver collagen) (Home Health) (Generic) 2 x Per Day/30 Days ary Discharge Instructions: moisten with normal saline Secondary Dressing: Woven Gauze Sponge, Non-Sterile 4x4 in (Home Health) (Generic) 2 x Per Day/30 Days Discharge Instructions: Apply over primary dressing as directed. Secondary Dressing: ABD Pad, 5x9 (Home Health) 2 x Per Day/30 Days Discharge Instructions: Apply over primary dressing as directed. Com pression Wrap: Kerlix Roll 4.5x3.1 (in/yd) (Home Health) (Generic) 2 x Per Day/30 Days Discharge Instructions: Apply Kerlix and Coban compression as directed lightly . from base of toes to patella knotch Com pression Wrap: Coban Self-Adherent Wrap 4x5 (in/yd) (Home Health) (Generic) 2 x Per Day/30 Days Discharge Instructions: Apply over Kerlix as directed.from base of toes to patella knotch WOUND #10: - Lower Leg Wound Laterality: Left, Anterior, Proximal Cleanser: Soap and Water 2 x Per Day/30 Days Discharge Instructions: May shower and wash wound with dial antibacterial soap and water prior to dressing change. Cleanser: Wound Cleanser 2 x Per Day/30 Days Discharge Instructions: Cleanse the wound with wound cleanser prior to applying a clean dressing using gauze sponges, not tissue or cotton balls. Cleanser: Normal Saline (Home Health) (Generic) 2 x Per Day/30 Days Discharge Instructions: Cleanse the wound with Normal Saline prior to applying a clean dressing using gauze sponges, not tissue or cotton balls. Prim Dressing: Promogran Prisma Matrix, 4.34 (sq in) (silver collagen) (Home Health) (Generic)  2 x Per Day/30 Days ary Discharge Instructions: moisten with normal saline Secondary Dressing: Woven Gauze Sponge, Non-Sterile 4x4 in (Home Health) (Generic) 2 x Per Day/30 Days Discharge Instructions: Apply over primary dressing as directed. Secondary Dressing: ABD Pad, 5x9 (Home Health) 2 x Per Day/30 Days Discharge Instructions: Apply over primary dressing as directed. Com pression Wrap: Kerlix Roll 4.5x3.1 (in/yd) (Home Health) (Generic) 2 x Per Day/30 Days Discharge Instructions: Apply Kerlix and Coban compression as directed lightly . from base of toes to patella knotch Com pression Wrap: Coban Self-Adherent Wrap 4x5 (in/yd) (Home Health) (Generic) 2 x Per Day/30 Days Discharge Instructions: Apply over Kerlix as directed.from base of toes to patella knotch  WOUND #11: - Lower Leg Wound Laterality: Left, Posterior Cleanser: Soap and Water 2 x Per Day/30 Days Discharge Instructions: May shower and wash wound with dial antibacterial soap and water prior to dressing change. Cleanser: Wound Cleanser 2 x Per Day/30 Days Discharge Instructions: Cleanse the wound with wound cleanser prior to applying a clean dressing using gauze sponges, not tissue or cotton balls. Cleanser: Normal Saline (Home Health) (Generic) 2 x Per Day/30 Days Discharge Instructions: Cleanse the wound with Normal Saline prior to applying a clean dressing using gauze sponges, not tissue or cotton balls. Prim Dressing: Promogran Prisma Matrix, 4.34 (sq in) (silver collagen) (Home Health) (Generic) 2 x Per Day/30 Days ary Discharge Instructions: moisten with normal saline Secondary Dressing: Woven Gauze Sponge, Non-Sterile 4x4 in (Home Health) (Generic) 2 x Per Day/30 Days Discharge Instructions: Apply over primary dressing as directed. Secondary Dressing: ABD Pad, 5x9 (Home Health) 2 x Per Day/30 Days Discharge Instructions: Apply over primary dressing as directed. Com pression Wrap: Kerlix Roll 4.5x3.1 (in/yd) (Home Health)  (Generic) 2 x Per Day/30 Days Discharge Instructions: Apply Kerlix and Coban compression as directed lightly . from base of toes to patella knotch Com pression Wrap: Coban Self-Adherent Wrap 4x5 (in/yd) (Home Health) (Generic) 2 x Per Day/30 Days Discharge Instructions: Apply over Kerlix as directed.from base of toes to patella knotch WOUND #12: - Lower Leg Wound Laterality: Right, Lateral Cleanser: Soap and Water 2 x Per Day/30 Days Discharge Instructions: May shower and wash wound with dial antibacterial soap and water prior to dressing change. Cleanser: Wound Cleanser 2 x Per Day/30 Days Discharge Instructions: Cleanse the wound with wound cleanser prior to applying a clean dressing using gauze sponges, not tissue or cotton balls. Cleanser: Normal Saline (Home Health) (Generic) 2 x Per Day/30 Days Discharge Instructions: Cleanse the wound with Normal Saline prior to applying a clean dressing using gauze sponges, not tissue or cotton balls. Prim Dressing: Promogran Prisma Matrix, 4.34 (sq in) (silver collagen) (Home Health) (Generic) 2 x Per Day/30 Days ary Discharge Instructions: moisten with normal saline Secondary Dressing: Woven Gauze Sponge, Non-Sterile 4x4 in (Home Health) (Generic) 2 x Per Day/30 Days Discharge Instructions: Apply over primary dressing as directed. Secondary Dressing: ABD Pad, 5x9 (Home Health) 2 x Per Day/30 Days Discharge Instructions: Apply over primary dressing as directed. Com pression Wrap: Kerlix Roll 4.5x3.1 (in/yd) (Home Health) (Generic) 2 x Per Day/30 Days Discharge Instructions: Apply Kerlix and Coban compression as directed lightly . from base of toes to patella knotch Com pression Wrap: Coban Self-Adherent Wrap 4x5 (in/yd) (Home Health) (Generic) 2 x Per Day/30 Days Discharge Instructions: Apply over Kerlix as directed.from base of toes to patella knotch WOUND #6: - Lower Leg Wound Laterality: Left, Medial Cleanser: Soap and Water 2 x Per Day/30  Days Discharge Instructions: May shower and wash wound with dial antibacterial soap and water prior to dressing change. Cleanser: Wound Cleanser 2 x Per Day/30 Days Discharge Instructions: Cleanse the wound with wound cleanser prior to applying a clean dressing using gauze sponges, not tissue or cotton balls. Cleanser: Normal Saline (Home Health) (Generic) 2 x Per Day/30 Days Discharge Instructions: Cleanse the wound with Normal Saline prior to applying a clean dressing using gauze sponges, not tissue or cotton balls. Prim Dressing: Promogran Prisma Matrix, 4.34 (sq in) (silver collagen) (Home Health) (Generic) 2 x Per Day/30 Days ary Discharge Instructions: moisten with normal saline Secondary Dressing: Woven Gauze Sponge, Non-Sterile 4x4 in (Home Health) (Generic) 2 x Per Day/30 Days Discharge  Instructions: Apply over primary dressing as directed. Secondary Dressing: ABD Pad, 5x9 (Home Health) 2 x Per Day/30 Days Discharge Instructions: Apply over primary dressing as directed. Com pression Wrap: Kerlix Roll 4.5x3.1 (in/yd) (Home Health) (Generic) 2 x Per Day/30 Days Discharge Instructions: Apply Kerlix and Coban compression as directed lightly . from base of toes to patella knotch Com pression Wrap: Coban Self-Adherent Wrap 4x5 (in/yd) (Home Health) (Generic) 2 x Per Day/30 Days Discharge Instructions: Apply over Kerlix as directed.from base of toes to patella knotch WOUND #8: - Calcaneus Wound Laterality: Left, Distal Cleanser: Soap and Water 2 x Per Day/30 Days Discharge Instructions: May shower and wash wound with dial antibacterial soap and water prior to dressing change. Cleanser: Wound Cleanser 2 x Per Day/30 Days Discharge Instructions: Cleanse the wound with wound cleanser prior to applying a clean dressing using gauze sponges, not tissue or cotton balls. Cleanser: Normal Saline (Home Health) (Generic) 2 x Per Day/30 Days Discharge Instructions: Cleanse the wound with Normal Saline  prior to applying a clean dressing using gauze sponges, not tissue or cotton balls. Prim Dressing: Promogran Prisma Matrix, 4.34 (sq in) (silver collagen) (Home Health) (Generic) 2 x Per Day/30 Days ary Discharge Instructions: moisten with normal saline Secondary Dressing: Woven Gauze Sponge, Non-Sterile 4x4 in (Home Health) (Generic) 2 x Per Day/30 Days Discharge Instructions: Apply over primary dressing as directed. Secondary Dressing: ABD Pad, 5x9 (Home Health) 2 x Per Day/30 Days Discharge Instructions: Apply over primary dressing as directed. Com pression Wrap: Kerlix Roll 4.5x3.1 (in/yd) (Home Health) (Generic) 2 x Per Day/30 Days Discharge Instructions: Apply Kerlix and Coban compression as directed lightly . from base of toes to patella knotch Com pression Wrap: Coban Self-Adherent Wrap 4x5 (in/yd) (Home Health) (Generic) 2 x Per Day/30 Days Discharge Instructions: Apply over Kerlix as directed.from base of toes to patella knotch 1. I am still using silver collagen on the new and old wounds. 2. The left heel looks like it is just about closed 3. Still using kerlix and Coban pending the vascular review next week. 4. She has home health I will see her back in 2-week Electronic Signature(s) Signed: 02/06/2021 4:37:05 PM By: Linton Ham MD Entered By: Linton Ham on 02/06/2021 13:56:07 -------------------------------------------------------------------------------- SuperBill Details Patient Name: Date of Service: Sonya Goodwin 02/06/2021 Medical Record Number: [id number] Patient Account Number: [id number] Date of Birth/Sex: Treating RN: 1941-10-16 (80 y.o. 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 L97.418 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 E11.621 Type 2 diabetes mellitus with foot ulcer Facility Procedures CPT4 Code: [id number] Description: 59741 - WOUND CARE VISIT-LEV 5 EST PT Modifier: Quantity: 1 Physician Procedures : CPT4 Code Description Modifier [id number] - WC PHYS LEVEL 3 - EST PT ICD-10 Diagnosis Description L97.819 Non-pressure chronic ulcer of other part of right lower leg with unspecified severity L97.821 Non-pressure chronic ulcer of other part of left  lower leg limited to breakdown of skin L97.428 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

## 2021-02-07 DIAGNOSIS — I872 Venous insufficiency (chronic) (peripheral): Secondary | ICD-10-CM | POA: Diagnosis not present

## 2021-02-07 DIAGNOSIS — S81812A Laceration without foreign body, left lower leg, initial encounter: Secondary | ICD-10-CM | POA: Diagnosis not present

## 2021-02-08 DIAGNOSIS — D631 Anemia in chronic kidney disease: Secondary | ICD-10-CM | POA: Diagnosis not present

## 2021-02-08 DIAGNOSIS — I872 Venous insufficiency (chronic) (peripheral): Secondary | ICD-10-CM | POA: Diagnosis not present

## 2021-02-08 DIAGNOSIS — L97812 Non-pressure chronic ulcer of other part of right lower leg with fat layer exposed: Secondary | ICD-10-CM | POA: Diagnosis not present

## 2021-02-08 DIAGNOSIS — I129 Hypertensive chronic kidney disease with stage 1 through stage 4 chronic kidney disease, or unspecified chronic kidney disease: Secondary | ICD-10-CM | POA: Diagnosis not present

## 2021-02-08 DIAGNOSIS — E1122 Type 2 diabetes mellitus with diabetic chronic kidney disease: Secondary | ICD-10-CM | POA: Diagnosis not present

## 2021-02-08 DIAGNOSIS — E1142 Type 2 diabetes mellitus with diabetic polyneuropathy: Secondary | ICD-10-CM | POA: Diagnosis not present

## 2021-02-08 DIAGNOSIS — G8929 Other chronic pain: Secondary | ICD-10-CM | POA: Diagnosis not present

## 2021-02-08 DIAGNOSIS — N1831 Chronic kidney disease, stage 3a: Secondary | ICD-10-CM | POA: Diagnosis not present

## 2021-02-08 DIAGNOSIS — L97822 Non-pressure chronic ulcer of other part of left lower leg with fat layer exposed: Secondary | ICD-10-CM | POA: Diagnosis not present

## 2021-02-08 DIAGNOSIS — M1710 Unilateral primary osteoarthritis, unspecified knee: Secondary | ICD-10-CM | POA: Diagnosis not present

## 2021-02-08 DIAGNOSIS — N183 Chronic kidney disease, stage 3 unspecified: Secondary | ICD-10-CM | POA: Diagnosis not present

## 2021-02-08 DIAGNOSIS — I10 Essential (primary) hypertension: Secondary | ICD-10-CM | POA: Diagnosis not present

## 2021-02-08 DIAGNOSIS — E876 Hypokalemia: Secondary | ICD-10-CM | POA: Diagnosis not present

## 2021-02-08 DIAGNOSIS — E1143 Type 2 diabetes mellitus with diabetic autonomic (poly)neuropathy: Secondary | ICD-10-CM | POA: Diagnosis not present

## 2021-02-12 DIAGNOSIS — L97812 Non-pressure chronic ulcer of other part of right lower leg with fat layer exposed: Secondary | ICD-10-CM | POA: Diagnosis not present

## 2021-02-12 DIAGNOSIS — L97822 Non-pressure chronic ulcer of other part of left lower leg with fat layer exposed: Secondary | ICD-10-CM | POA: Diagnosis not present

## 2021-02-12 DIAGNOSIS — I129 Hypertensive chronic kidney disease with stage 1 through stage 4 chronic kidney disease, or unspecified chronic kidney disease: Secondary | ICD-10-CM | POA: Diagnosis not present

## 2021-02-12 DIAGNOSIS — D631 Anemia in chronic kidney disease: Secondary | ICD-10-CM | POA: Diagnosis not present

## 2021-02-12 DIAGNOSIS — S81812A Laceration without foreign body, left lower leg, initial encounter: Secondary | ICD-10-CM | POA: Diagnosis not present

## 2021-02-12 DIAGNOSIS — E1142 Type 2 diabetes mellitus with diabetic polyneuropathy: Secondary | ICD-10-CM | POA: Diagnosis not present

## 2021-02-12 DIAGNOSIS — I872 Venous insufficiency (chronic) (peripheral): Secondary | ICD-10-CM | POA: Diagnosis not present

## 2021-02-12 DIAGNOSIS — N1831 Chronic kidney disease, stage 3a: Secondary | ICD-10-CM | POA: Diagnosis not present

## 2021-02-12 DIAGNOSIS — E1122 Type 2 diabetes mellitus with diabetic chronic kidney disease: Secondary | ICD-10-CM | POA: Diagnosis not present

## 2021-02-12 DIAGNOSIS — M1710 Unilateral primary osteoarthritis, unspecified knee: Secondary | ICD-10-CM | POA: Diagnosis not present

## 2021-02-12 DIAGNOSIS — E1143 Type 2 diabetes mellitus with diabetic autonomic (poly)neuropathy: Secondary | ICD-10-CM | POA: Diagnosis not present

## 2021-02-13 ENCOUNTER — Encounter: Payer: Self-pay | Admitting: Vascular Surgery

## 2021-02-13 ENCOUNTER — Ambulatory Visit: Payer: Medicare HMO | Admitting: Vascular Surgery

## 2021-02-13 ENCOUNTER — Other Ambulatory Visit: Payer: Self-pay

## 2021-02-13 VITALS — BP 159/73 | HR 100 | Temp 97.5°F | Resp 20 | Ht 65.0 in | Wt 138.0 lb

## 2021-02-13 DIAGNOSIS — L97919 Non-pressure chronic ulcer of unspecified part of right lower leg with unspecified severity: Secondary | ICD-10-CM | POA: Diagnosis not present

## 2021-02-13 DIAGNOSIS — I87313 Chronic venous hypertension (idiopathic) with ulcer of bilateral lower extremity: Secondary | ICD-10-CM | POA: Diagnosis not present

## 2021-02-13 DIAGNOSIS — L97929 Non-pressure chronic ulcer of unspecified part of left lower leg with unspecified severity: Secondary | ICD-10-CM | POA: Diagnosis not present

## 2021-02-13 NOTE — Progress Notes (Signed)
ASSESSMENT & PLAN:  80 y.o. female with chronic venous insufficiency of bilateral lower extremities. Palpable pedal pulses. Non-invasive studies reassuring. Recommend zinc-impregnated graded compression dressings changed weekly (unna boots) until wound healed. Continue care with the wound clinic. Follow up with me as needed.   CHIEF COMPLAINT:   Venous ulcers  HISTORY:  HISTORY OF PRESENT ILLNESS: Sonya Goodwin is a 80 y.o. female referred to clinic for evaluation of bilateral lower extremity venous stasis ulceration.  These have been present for many years.  They wax and wane.  They seem to improve when she is compliant with her compression.  She has multiple medical problems including renal insufficiency chronic steroid therapy.  Past Medical History:  Diagnosis Date  . Adrenal insufficiency ([place])   . Anemia   . Arthritis   . Barrett's esophagus   . Cancer ([place])    skin cancer, "spot on pancreas"  . CKD (chronic kidney disease)    Stage III as of 09/2013, kidney stones as well  . Deafness in right ear    wears hearing aids  . Diabetes mellitus without complication ([place])    Type II  . Diabetic gastroparesis ([place])   . Diabetic neuropathy ([place])   . Dysphagia   . GERD (gastroesophageal reflux disease)    takes Protonix  . Glaucoma   . High cholesterol   . History of cervical dysplasia   . History of kidney stones   . Hx: UTI (urinary tract infection)   . Hypertension   . Low back pain    chronic axial- Dr. Nelva Bush  . Osteoarthritis of knee    followed by ortho, Dr. Theda Sers  . Osteopenia    bone density T score-1.8 right femoral neck, 2010, -2.0 september 2012   . Peripheral arterial disease ([place])   . PONV (postoperative nausea and vomiting)   . Sarcoidosis   . Shortness of breath   . Sleep apnea    mild  . Spongiotic dermatitis    right breast, mild inflammation on biopsy  . Thyroid nodule    f/u ultrasound done by Dr. Cyndie Chime, [place] 10/10/09 were  stable, smaller , followed since 2005, neg biopsy sev years ago, no further u/s needed per Dr. Cyndie Chime    Past Surgical History:  Procedure Laterality Date  . BREAST BIOPSY Bilateral   . BREAST EXCISIONAL BIOPSY Right   . BRONCHOSCOPY  1984  . cataract surgery     both eyes  . CHOLECYSTECTOMY  1981  . COLONOSCOPY    . COLONOSCOPY WITH PROPOFOL N/A 12/19/2015   Procedure: COLONOSCOPY WITH PROPOFOL;  Surgeon: Garlan Fair, MD;  Location: [facility];  Service: Endoscopy;  Laterality: N/A;  . DILATION AND CURETTAGE OF UTERUS  1965  . ESOPHAGOGASTRODUODENOSCOPY (EGD) WITH ESOPHAGEAL DILATION    . KNEE ARTHROSCOPY  2011  . LUMBAR LAMINECTOMY/DECOMPRESSION MICRODISCECTOMY N/A 11/18/2013   Procedure: DECOMPRESSION L3 - L5  2 LEVELS;  Surgeon: Melina Schools, MD;  Location: [facility];  Service: Orthopedics;  Laterality: N/A;  . ORIF DISTAL RADIUS FRACTURE  2010  . PARTIAL HYSTERECTOMY  1971    Family History  Problem Relation Age of Onset  . Cancer Father   . Heart attack Brother   . Heart disease Brother   . Breast cancer Neg Hx     Social History   Socioeconomic History  . Marital status: Married    Spouse name: Not on file  . Number of children: Not on file  .  Years of education: Not on file  . Highest education level: Not on file  Occupational History  . Not on file  Tobacco Use  . Smoking status: Never Smoker  . Smokeless tobacco: Never Used  Vaping Use  . Vaping Use: Never used  Substance and Sexual Activity  . Alcohol use: No  . Drug use: No  . Sexual activity: Not on file  Other Topics Concern  . Not on file  Social History Narrative  . Not on file   Social Determinants of Health   Financial Resource Strain: Not on file  Food Insecurity: Not on file  Transportation Needs: Not on file  Physical Activity: Not on file  Stress: Not on file  Social Connections: Not on file  Intimate Partner Violence: Not on file    Allergies  Allergen Reactions  .  Metoclopramide Other (See Comments)    unknown  . Codeine Nausea And Vomiting    Pt reports that all opiates "make me violently ill".  . Other Itching and Rash    Food-cranberries  . Pain Patch [Menthol] Nausea And Vomiting  . Statins Other (See Comments)    Muscle wasting, attempted Crestor 10mg  biw, then decreased to qw, but still had muscle problems.  Re-challenged after off x several weeks, problem resumed.  Muscle wasting, attempted Crestor 10mg  biw, then decreased to qw, but still had muscle problems.  Re-challenged after off x several weeks, problem resumed.   . Alendronate Other (See Comments)    Stomach pain  . Aspirin Tinitus  . Duragesic-100 [Fentanyl] Tinitus    Duragesic patch  . Fenofibrate Other (See Comments)    Arthralgias and myalgias  . Fosamax [Alendronate Sodium] Other (See Comments)    Stomach pain  . Losartan Other (See Comments)    Tremors  . Miacalcin [Calcitonin ([place])] Other (See Comments)    unknown  . Nitrofurantoin Other (See Comments)    Tremors  . Nsaids Other (See Comments)    Avoid with CKD III  . Prolia [Denosumab] Other (See Comments)    Due to kidney function   . Sulfamethoxazole-Trimethoprim Nausea And Vomiting  . Tolmetin Other (See Comments)    Avoid with CKD III Avoid with CKD III  . Welchol [Colesevelam] Other (See Comments)    Myalgias  . Zetia [Ezetimibe]     Arthralgias and myalgias  . Lovaza [Omega-3-Acid Ethyl Esters] Palpitations    Current Outpatient Medications  Medication Sig Dispense Refill  . amitriptyline (ELAVIL) 25 MG tablet Take 25 mg by mouth at bedtime.    Marland Kitchen aspirin 81 MG chewable tablet Chew 81 mg by mouth daily.    . Cholecalciferol (VITAMIN D) 50 MCG (2000 UT) CAPS Take 1 capsule by mouth daily.    . dorzolamide (TRUSOPT) 2 % ophthalmic solution Place 1 drop into both eyes 2 (two) times daily.    . meclizine (ANTIVERT) 25 MG tablet Take 25 mg by mouth 2 (two) times daily as needed for dizziness or nausea.      . methylPREDNISolone (MEDROL) 4 MG tablet Take 4 mg by mouth See admin instructions. Take 4mg  twice daily. Can take up to 32mg  as needed when ill.    . Multiple Vitamin (MULTIVITAMIN WITH MINERALS) TABS tablet Take 1 tablet by mouth daily.    . Omega-3 Fatty Acids (FISH OIL PO) Take 2 capsules by mouth daily.    . ONE TOUCH ULTRA TEST test strip Use to test your blood sugar once daily as directed  6  .  pioglitazone (ACTOS) 15 MG tablet Take 15 mg by mouth daily.    . polyethylene glycol (MIRALAX / GLYCOLAX) 17 g packet Take 17 g by mouth daily. 14 each 0  . potassium chloride (K-DUR) 10 MEQ tablet Take 1 tablet (10 mEq total) by mouth daily. (Patient taking differently: Take 20 mEq by mouth daily.) 30 tablet 11  . rOPINIRole (REQUIP) 2 MG tablet Take 2 mg by mouth at bedtime.     . rosuvastatin (CRESTOR) 5 MG tablet Take 5 mg by mouth daily.    . traMADol (ULTRAM) 50 MG tablet Take 1 tablet (50 mg total) by mouth every 6 (six) hours as needed for moderate pain. 60 tablet 0  . triamterene-hydrochlorothiazide (MAXZIDE) 75-50 MG tablet Take 1 tablet by mouth daily.    Marland Kitchen gabapentin (NEURONTIN) 100 MG capsule Take 1 capsule (100 mg total) by mouth See admin instructions. Take 100mg  in the morning, 100mg  in the afternoon and 300mg  at bedtime. 120 capsule 0  . pantoprazole (PROTONIX) 40 MG tablet Take 1 tablet (40 mg total) by mouth daily. 120 tablet 0   No current facility-administered medications for this visit.    REVIEW OF SYSTEMS:  [X]  denotes positive finding, [ ]  denotes negative finding Cardiac  Comments:  Chest pain or chest pressure:    Shortness of breath upon exertion:    Short of breath when lying flat:    Irregular heart rhythm:        Vascular    Pain in calf, thigh, or hip brought on by ambulation:    Pain in feet at night that wakes you up from your sleep:     Blood clot in your veins:    Leg swelling:         Pulmonary    Oxygen at home:    Productive cough:      Wheezing:         Neurologic    Sudden weakness in arms or legs:     Sudden numbness in arms or legs:     Sudden onset of difficulty speaking or slurred speech:    Temporary loss of vision in one eye:     Problems with dizziness:         Gastrointestinal    Blood in stool:     Vomited blood:         Genitourinary    Burning when urinating:     Blood in urine:        Psychiatric    Major depression:         Hematologic    Bleeding problems:    Problems with blood clotting too easily:        Skin    Rashes or ulcers:        Constitutional    Fever or chills:     PHYSICAL EXAM:   Vitals:   02/13/21 1255  BP: (!) 159/73  Pulse: 100  Resp: 20  Temp: (!) 97.5 F (36.4 C)  SpO2: 97%  Weight: 138 lb (62.6 kg)  Height: 5\' 5"  (1.651 m)   Constitutional: chronically ill appearing elderly woman in no distress. Appears adequately nourished.  Neurologic: CN intact. No focal findings. no sensory loss. Psychiatric: Mood and affect symmetric and appropriate. Eyes: No icterus. No conjunctival pallor. Ears, nose, throat: mucous membranes moist. Midline trachea.  Cardiac: Regular rate and rhythm.  Respiratory: unlabored. Abdominal: soft, non-tender, non-distended.  Peripheral vascular:  2+ DP pulses  Cyanosis of feet bilaterally  <  2s capillary refill  Scattered venous ulcerations about bilateral lower legs in various stages of healing  Atrophic calf musculature Extremity: No edema. No cyanosis. No pallor.  Skin: No gangrene. No ulceration.  Lymphatic: No Stemmer's sign. No palpable lymphadenopathy.   DATA REVIEW:    Most recent CBC CBC Latest Ref Rng & Units 06/19/2020 06/18/2020 06/17/2020  WBC 4.0 - 10.5 K/uL 8.9 9.1 11.9(H)  Hemoglobin 12.0 - 15.0 g/dL 14.2 14.1 14.3  Hematocrit 36.0 - 46.0 % 44.0 44.9 45.6  Platelets 150 - 400 K/uL 168 153 171     Most recent CMP CMP Latest Ref Rng & Units 06/21/2020 06/20/2020 06/19/2020  Glucose 70 - 99 mg/dL 122(H) 139(H)  143(H)  BUN 8 - 23 mg/dL 22 21 16   Creatinine 0.44 - 1.00 mg/dL 0.99 1.09(H) 0.94  Sodium 135 - 145 mmol/L 141 139 140  Potassium 3.5 - 5.1 mmol/L 4.1 4.1 3.5  Chloride 98 - 111 mmol/L 103 105 102  CO2 22 - 32 mmol/L 24 24 26   Calcium 8.9 - 10.3 mg/dL 9.0 9.1 8.9  Total Protein 6.5 - 8.1 g/dL - - -  Total Bilirubin 0.3 - 1.2 mg/dL - - -  Alkaline Phos 38 - 126 U/L - - -  AST 15 - 41 U/L - - -  ALT 0 - 44 U/L - - -    Renal function CrCl cannot be calculated (Patient's most recent lab result is older than the maximum 21 days allowed.).  Hgb A1c MFr Bld (%)  Date Value  06/16/2020 6.3 (H)    LDL Cholesterol  Date Value Ref Range Status  12/14/2015 NOT CALC <130 mg/dL Final    Comment:      Not calculated due to Triglyceride >400. Suggest ordering Direct LDL (Unit Code: [id number]).   Total Cholesterol/HDL Ratio:CHD Risk                        Coronary Heart Disease Risk Table                                        Men       Women          1/2 Average Risk              3.4        3.3              Average Risk              5.0        4.4           2X Average Risk              9.6        7.1           3X Average Risk             23.4       11.0 Use the calculated Patient Ratio above and the CHD Risk table  to determine the patient's CHD Risk.    Direct LDL  Date Value Ref Range Status  05/09/2015 169.0 mg/dL Final    Comment:    Optimal:  <100 mg/dLNear or Above Optimal:  100-129 mg/dLBorderline High:  130-159 mg/dLHigh:  160-189 mg/dLVery High:  >190 mg/dL     Yevonne Aline. Stanford Breed, MD [facility] Phone Number: [phone] 02/13/2021 3:35 PM

## 2021-02-15 DIAGNOSIS — D631 Anemia in chronic kidney disease: Secondary | ICD-10-CM | POA: Diagnosis not present

## 2021-02-15 DIAGNOSIS — L97812 Non-pressure chronic ulcer of other part of right lower leg with fat layer exposed: Secondary | ICD-10-CM | POA: Diagnosis not present

## 2021-02-15 DIAGNOSIS — N1831 Chronic kidney disease, stage 3a: Secondary | ICD-10-CM | POA: Diagnosis not present

## 2021-02-15 DIAGNOSIS — M1710 Unilateral primary osteoarthritis, unspecified knee: Secondary | ICD-10-CM | POA: Diagnosis not present

## 2021-02-15 DIAGNOSIS — I872 Venous insufficiency (chronic) (peripheral): Secondary | ICD-10-CM | POA: Diagnosis not present

## 2021-02-15 DIAGNOSIS — L97822 Non-pressure chronic ulcer of other part of left lower leg with fat layer exposed: Secondary | ICD-10-CM | POA: Diagnosis not present

## 2021-02-15 DIAGNOSIS — I129 Hypertensive chronic kidney disease with stage 1 through stage 4 chronic kidney disease, or unspecified chronic kidney disease: Secondary | ICD-10-CM | POA: Diagnosis not present

## 2021-02-15 DIAGNOSIS — E1143 Type 2 diabetes mellitus with diabetic autonomic (poly)neuropathy: Secondary | ICD-10-CM | POA: Diagnosis not present

## 2021-02-15 DIAGNOSIS — E1122 Type 2 diabetes mellitus with diabetic chronic kidney disease: Secondary | ICD-10-CM | POA: Diagnosis not present

## 2021-02-15 DIAGNOSIS — E1142 Type 2 diabetes mellitus with diabetic polyneuropathy: Secondary | ICD-10-CM | POA: Diagnosis not present

## 2021-02-16 DIAGNOSIS — H04123 Dry eye syndrome of bilateral lacrimal glands: Secondary | ICD-10-CM | POA: Diagnosis not present

## 2021-02-16 DIAGNOSIS — E119 Type 2 diabetes mellitus without complications: Secondary | ICD-10-CM | POA: Diagnosis not present

## 2021-02-16 DIAGNOSIS — H40013 Open angle with borderline findings, low risk, bilateral: Secondary | ICD-10-CM | POA: Diagnosis not present

## 2021-02-19 ENCOUNTER — Telehealth: Payer: Self-pay

## 2021-02-19 DIAGNOSIS — N1831 Chronic kidney disease, stage 3a: Secondary | ICD-10-CM | POA: Diagnosis not present

## 2021-02-19 DIAGNOSIS — I129 Hypertensive chronic kidney disease with stage 1 through stage 4 chronic kidney disease, or unspecified chronic kidney disease: Secondary | ICD-10-CM | POA: Diagnosis not present

## 2021-02-19 DIAGNOSIS — L97221 Non-pressure chronic ulcer of left calf limited to breakdown of skin: Secondary | ICD-10-CM | POA: Diagnosis not present

## 2021-02-19 DIAGNOSIS — L97821 Non-pressure chronic ulcer of other part of left lower leg limited to breakdown of skin: Secondary | ICD-10-CM | POA: Diagnosis not present

## 2021-02-19 DIAGNOSIS — E1122 Type 2 diabetes mellitus with diabetic chronic kidney disease: Secondary | ICD-10-CM | POA: Diagnosis not present

## 2021-02-19 DIAGNOSIS — I872 Venous insufficiency (chronic) (peripheral): Secondary | ICD-10-CM | POA: Diagnosis not present

## 2021-02-19 DIAGNOSIS — D631 Anemia in chronic kidney disease: Secondary | ICD-10-CM | POA: Diagnosis not present

## 2021-02-19 DIAGNOSIS — E1142 Type 2 diabetes mellitus with diabetic polyneuropathy: Secondary | ICD-10-CM | POA: Diagnosis not present

## 2021-02-19 DIAGNOSIS — M1710 Unilateral primary osteoarthritis, unspecified knee: Secondary | ICD-10-CM | POA: Diagnosis not present

## 2021-02-19 DIAGNOSIS — E1143 Type 2 diabetes mellitus with diabetic autonomic (poly)neuropathy: Secondary | ICD-10-CM | POA: Diagnosis not present

## 2021-02-19 NOTE — Telephone Encounter (Signed)
S/w Jackelyn Poling, nurse with [facility] - stated Dr. Dellia Nims did not want the unna boots put back on until he seen the pt., Debbie will receive orders for wound care from Dr. Asa Saunas going forward.

## 2021-02-20 ENCOUNTER — Encounter (HOSPITAL_BASED_OUTPATIENT_CLINIC_OR_DEPARTMENT_OTHER): Payer: Medicare HMO | Admitting: Internal Medicine

## 2021-02-20 ENCOUNTER — Other Ambulatory Visit: Payer: Self-pay

## 2021-02-20 DIAGNOSIS — E11622 Type 2 diabetes mellitus with other skin ulcer: Secondary | ICD-10-CM | POA: Diagnosis not present

## 2021-02-20 DIAGNOSIS — S81811A Laceration without foreign body, right lower leg, initial encounter: Secondary | ICD-10-CM | POA: Diagnosis not present

## 2021-02-20 DIAGNOSIS — L97821 Non-pressure chronic ulcer of other part of left lower leg limited to breakdown of skin: Secondary | ICD-10-CM | POA: Diagnosis not present

## 2021-02-20 DIAGNOSIS — L97822 Non-pressure chronic ulcer of other part of left lower leg with fat layer exposed: Secondary | ICD-10-CM | POA: Diagnosis not present

## 2021-02-20 DIAGNOSIS — S91102A Unspecified open wound of left great toe without damage to nail, initial encounter: Secondary | ICD-10-CM | POA: Diagnosis not present

## 2021-02-20 DIAGNOSIS — S81812A Laceration without foreign body, left lower leg, initial encounter: Secondary | ICD-10-CM | POA: Diagnosis not present

## 2021-02-20 DIAGNOSIS — L97222 Non-pressure chronic ulcer of left calf with fat layer exposed: Secondary | ICD-10-CM | POA: Diagnosis not present

## 2021-02-20 DIAGNOSIS — L97819 Non-pressure chronic ulcer of other part of right lower leg with unspecified severity: Secondary | ICD-10-CM | POA: Diagnosis not present

## 2021-02-20 DIAGNOSIS — Z92241 Personal history of systemic steroid therapy: Secondary | ICD-10-CM | POA: Diagnosis not present

## 2021-02-20 DIAGNOSIS — E1122 Type 2 diabetes mellitus with diabetic chronic kidney disease: Secondary | ICD-10-CM | POA: Diagnosis not present

## 2021-02-20 DIAGNOSIS — I872 Venous insufficiency (chronic) (peripheral): Secondary | ICD-10-CM | POA: Diagnosis not present

## 2021-02-20 NOTE — Progress Notes (Signed)
Sonya, Goodwin ([id number]) Visit Report for 02/20/2021 HPI Details Patient Name: Date of Service: [name], [name] 02/20/2021 12:30 PM Medical Record Number: [id number] Patient Account Number: [id number] Date of Birth/Sex: Treating RN: 1941/05/20 (80 y.o. Sonya Goodwin Primary Care Provider: Nicholes Stairs Other Clinician: Referring Provider: Treating Provider/Extender: Nyra Jabs in Treatment: 25 History of Present Illness HPI Description: We think they have been using silver alginateADMISSION 08/29/2020 This is a 80 year old woman who has wounds on her left lower leg mid aspect. She states that these have been present since June when she was hospitalized with a UTI, delirium possibly medication issues. I do not see any mention of the wounds on her left leg at that time. In any case these develop sometime after this. The patient is followed by Dr. Jacelyn Grip at [place] [facility]. She has been using bacitracin ointment to the wounds. She has [facility] home health although I am not exactly sure what they are doing. Her husband is angry about a bill from home health again we were not able to help him exactly. The patient has chronic sarcoidosis and adrenal insufficiency she has been on longstanding prednisone. She has all the classic skin features of chronic steroid use. I think this is the primary issue for these wounds. They have also noted weeping fluid coming out of the wounds and even weeping draining fluid on the right leg although we were not able to define any wound on the right leg. She could very well have a component of chronic venous insufficiency as well. Past medical history includes sarcoidosis, type 2 diabetes with a recent hemoglobin A1c of 6.3, diabetic neuropathy, stage III P chronic renal failure, hypertension, adrenal insufficiency. ABI on the right noncompressible on the left at 1.02 9/14 2 small punched out areas of the left  anterior mid tibia. We have been using Iodoflex. Better looking wound surfaces 9/28; the 2 areas on the left anterior mid tibia are about the same. She has a new weeping area on the right side just medial to the tibia. I think all of these wounds are weeping edema fluid from a collection of fluid in the upper third of her lower leg. The skin distally is more fibrosed and there is no room for weeping edema fluid. I wanted to put her in 3 layer compression versus kerlix and Coban but she will not agree to it 10/12; 2-week follow-up. She has the 2 areas on the left anterior mid tibia. There is scissor injury now on the left medial calf. In a single area on the right medial lower extremity. All of these are roughly the same small punched out surfaces. Weeping edema fluid. She does not tolerate anything more than kerlix Coban and even with this she finds this too tight often making her husband cut these off. I had ordered Iodoflex to these wounds but according to her husband that is not what home health is using. 11/9; patient missed her last appointment so she has not been seen here in almost a month. She has the 2 punched-out areas on the left mid tibia 1 medial and 1 lateral a small excoriation a little more superior and lateral also on the left but she has also punched out areas on the right that she did not have last time I am not exactly sure how this happened. She complains about pain in her feet at night especially in the morning. Not really claudication but I was  I was concerned. Her edema is satisfactorily concerned and the kerlix and Coban 12/7; again a monthly follow-up. I am not exactly sure how we got into this monthly pattern. She does have home health coming out 3 times a week. She did manage to get the arterial studies done that I ordered last time. On the right her ABI was noncompressible although she had triphasic waveforms at the dorsalis pedis her great toe pressure was unobtainable.  On the left she had triphasic waveforms at the PTA and dorsalis pedis but again absent great toe waveforms. Segmental waveform analysis showed suggestion of posterior tibial artery occlusion on the right but a biphasic triphasic waveform with a normal normal examination on the left. The patient describes a lot of pain it is not clearly claudication but certainly there is a possibility here. She also has what I think is longstanding Raynaud's dating back to when she was a teenager She has new wounds on the left Achilles heel on the right Achilles heel from last time otherwise her wounds are about the same 1/4; the patient has an appointment with vascular on 01/09/2021. Basically I am asking if there is macrovascular issues that need to be addressed she still complains of a lot of pain in her feet and heels when she walks although I am not sure this is claudication. She also has longstanding Raynaud's by her description dating back to when she was a teenager. She has small punched-out areas on the left anterior lower x2, left anterior leg left heel and right heel. She tells me that the home health nurse stopped wrapping the right leg about 2 weeks ago there is a lot of edema in this leg threatening to break open. Small wound on the right lateral leg. 1/20; the patient's appointment with vascular had to be delayed. She has no open wounds on the right leg on the left she has an area on the left Achilles heel, left medial calf 2 areas anteriorly. All of these small punched out areas. 2/8; patient's vascular appointment is next week this is in consultation with regards to the lower extremity blood supply. She has 2 new wounds one on the right lateral and one on the left posterior. The 3 wounds on the left anterior are about the same. We have been using silver collagen with a kerlix and Coban wrap. 2/22; patient saw Dr. Stanford Breed on 2/15. He felt she had venous stasis ulcerations. Did not seem to think she had  any significant arterial issues. He put her in Unna boot compressions although home health took this off and put her back in kerlix Coban. She comes in with an area on the left lateral upper calf that looks like a skin tear. Almost circumferential undermining by 4 to 5 cm. The other major areas on the left lateral calf 3 small punched-out areas that interconnect she has a small punched-out area on the left anterior lower leg. I did not see anything on either heel today Electronic Signature(s) Signed: 02/20/2021 5:22:30 PM By: Linton Ham MD Entered By: Linton Ham on 02/20/2021 14:04:21 -------------------------------------------------------------------------------- Physical Exam Details Patient Name: Date of Service: Sonya Chambers. 02/20/2021 12:30 PM Medical Record Number: [id number] Patient Account Number: [id number] Date of Birth/Sex: Treating RN: 15-Sep-1941 (80 y.o. Sonya Goodwin, Sonya Goodwin Primary Care Provider: Nicholes Stairs Other Clinician: Referring Provider: Treating Provider/Extender: Nyra Jabs in Treatment: 25 Notes Wound exam; right lateral calf wound we identified last time but I do  not think what we are looking at today is the same issue it superior and has almost circumferential undermining looks like a skin tear. On the left lateral calf she has 3 connecting areas. Left Achilles I could not see anything open. She has a small punched-out area on the left anterior lower leg Electronic Signature(s) Signed: 02/20/2021 5:22:30 PM By: Linton Ham MD Entered By: Linton Ham on 02/20/2021 14:05:40 -------------------------------------------------------------------------------- Physician Orders Details Patient Name: Date of Service: Sonya Chambers. 02/20/2021 12:30 PM Medical Record Number: [id number] Patient Account Number: [id number] Date of Birth/Sex: Treating RN: 06-24-1941 (80 y.o. Sonya Goodwin, Sonya Goodwin Primary Care Provider:  Nicholes Stairs Other Clinician: Referring Provider: Treating Provider/Extender: Nyra Jabs in Treatment: 25 Verbal / Phone Orders: No Diagnosis Coding Follow-up Appointments Return Appointment in 1 week. Bathing/ Shower/ Hygiene May shower with protection but do not get wound dressing(s) wet. Edema Control - Lymphedema / SCD / Other Elevate legs to the level of the heart or above for 30 minutes daily and/or when sitting, a frequency of: Avoid standing for long periods of time. Exercise regularly Additional Orders / Instructions Other: - ***Please pad the left dorsal foot**** Home Health New wound care orders this week; continue Home Health for wound care. May utilize formulary equivalent dressing for wound treatment orders unless otherwise specified. [facility]- see pt. this Friday and next Friday. Pt. will be coming to [facility] next tuesday. Wound Treatment Wound #1 - Lower Leg Wound Laterality: Left, Anterior Cleanser: Soap and Water 2 x Per Day/30 Days Discharge Instructions: May shower and wash wound with dial antibacterial soap and water prior to dressing change. Cleanser: Wound Cleanser 2 x Per Day/30 Days Discharge Instructions: Cleanse the wound with wound cleanser prior to applying a clean dressing using gauze sponges, not tissue or cotton balls. Cleanser: Normal Saline (Home Health) (Generic) 2 x Per Day/30 Days Discharge Instructions: Cleanse the wound with Normal Saline prior to applying a clean dressing using gauze sponges, not tissue or cotton balls. Prim Dressing: Hydrofera Blue Classic Foam, 4x4 in (Home Health) 2 x Per Day/30 Days ary Discharge Instructions: Moisten with saline prior to applying to wound bed Secondary Dressing: Woven Gauze Sponge, Non-Sterile 4x4 in (Home Health) (Generic) 2 x Per Day/30 Days Discharge Instructions: Apply over primary dressing as directed. Secondary Dressing: ABD Pad, 5x9 (Home Health) 2 x Per Day/30  Days Discharge Instructions: Apply over primary dressing as directed. Compression Wrap: Unnaboot w/Calamine, 4x10 (in/yd) (Home Health) 2 x Per Day/30 Days Discharge Instructions: Apply Unnaboot as directed. Wound #10 - Lower Leg Wound Laterality: Left, Anterior, Proximal Cleanser: Soap and Water 2 x Per Day/30 Days Discharge Instructions: May shower and wash wound with dial antibacterial soap and water prior to dressing change. Cleanser: Wound Cleanser 2 x Per Day/30 Days Discharge Instructions: Cleanse the wound with wound cleanser prior to applying a clean dressing using gauze sponges, not tissue or cotton balls. Cleanser: Normal Saline (Home Health) (Generic) 2 x Per Day/30 Days Discharge Instructions: Cleanse the wound with Normal Saline prior to applying a clean dressing using gauze sponges, not tissue or cotton balls. Prim Dressing: Hydrofera Blue Classic Foam, 4x4 in (Home Health) 2 x Per Day/30 Days ary Discharge Instructions: Moisten with saline prior to applying to wound bed Secondary Dressing: Woven Gauze Sponge, Non-Sterile 4x4 in (Home Health) (Generic) 2 x Per Day/30 Days Discharge Instructions: Apply over primary dressing as directed. Secondary Dressing: ABD Pad, 5x9 (Home Health) 2 x Per Day/30  Days Discharge Instructions: Apply over primary dressing as directed. Compression Wrap: Unnaboot w/Calamine, 4x10 (in/yd) (Home Health) 2 x Per Day/30 Days Discharge Instructions: Apply Unnaboot as directed. Wound #11 - Lower Leg Wound Laterality: Left, Posterior Cleanser: Soap and Water 2 x Per Day/30 Days Discharge Instructions: May shower and wash wound with dial antibacterial soap and water prior to dressing change. Cleanser: Wound Cleanser 2 x Per Day/30 Days Discharge Instructions: Cleanse the wound with wound cleanser prior to applying a clean dressing using gauze sponges, not tissue or cotton balls. Cleanser: Normal Saline (Home Health) (Generic) 2 x Per Day/30 Days Discharge  Instructions: Cleanse the wound with Normal Saline prior to applying a clean dressing using gauze sponges, not tissue or cotton balls. Prim Dressing: Hydrofera Blue Classic Foam, 4x4 in (Home Health) 2 x Per Day/30 Days ary Discharge Instructions: Moisten with saline prior to applying to wound bed Secondary Dressing: Woven Gauze Sponge, Non-Sterile 4x4 in (Home Health) (Generic) 2 x Per Day/30 Days Discharge Instructions: Apply over primary dressing as directed. Secondary Dressing: ABD Pad, 5x9 (Home Health) 2 x Per Day/30 Days Discharge Instructions: Apply over primary dressing as directed. Compression Wrap: Unnaboot w/Calamine, 4x10 (in/yd) (Home Health) 2 x Per Day/30 Days Discharge Instructions: Apply Unnaboot as directed. Wound #13 - Lower Leg Wound Laterality: Right, Lateral Cleanser: Soap and Water 2 x Per Day/30 Days Discharge Instructions: May shower and wash wound with dial antibacterial soap and water prior to dressing change. Cleanser: Wound Cleanser 2 x Per Day/30 Days Discharge Instructions: Cleanse the wound with wound cleanser prior to applying a clean dressing using gauze sponges, not tissue or cotton balls. Cleanser: Normal Saline (Home Health) (Generic) 2 x Per Day/30 Days Discharge Instructions: Cleanse the wound with Normal Saline prior to applying a clean dressing using gauze sponges, not tissue or cotton balls. Prim Dressing: Hydrofera Blue Classic Foam, 4x4 in (Home Health) 2 x Per Day/30 Days ary Discharge Instructions: Moisten with saline prior to applying to wound bed Secondary Dressing: Woven Gauze Sponge, Non-Sterile 4x4 in (Home Health) (Generic) 2 x Per Day/30 Days Discharge Instructions: Apply over primary dressing as directed. Secondary Dressing: ABD Pad, 5x9 (Home Health) 2 x Per Day/30 Days Discharge Instructions: Apply over primary dressing as directed. Compression Wrap: Unnaboot w/Calamine, 4x10 (in/yd) (Home Health) 2 x Per Day/30 Days Discharge  Instructions: Apply Unnaboot as directed. Wound #6 - Lower Leg Wound Laterality: Left, Medial Cleanser: Soap and Water 2 x Per Day/30 Days Discharge Instructions: May shower and wash wound with dial antibacterial soap and water prior to dressing change. Cleanser: Wound Cleanser 2 x Per Day/30 Days Discharge Instructions: Cleanse the wound with wound cleanser prior to applying a clean dressing using gauze sponges, not tissue or cotton balls. Cleanser: Normal Saline (Home Health) (Generic) 2 x Per Day/30 Days Discharge Instructions: Cleanse the wound with Normal Saline prior to applying a clean dressing using gauze sponges, not tissue or cotton balls. Prim Dressing: Hydrofera Blue Classic Foam, 4x4 in (Home Health) 2 x Per Day/30 Days ary Discharge Instructions: Moisten with saline prior to applying to wound bed Secondary Dressing: Woven Gauze Sponge, Non-Sterile 4x4 in (Home Health) (Generic) 2 x Per Day/30 Days Discharge Instructions: Apply over primary dressing as directed. Secondary Dressing: ABD Pad, 5x9 (Home Health) 2 x Per Day/30 Days Discharge Instructions: Apply over primary dressing as directed. Compression Wrap: Unnaboot w/Calamine, 4x10 (in/yd) (Home Health) 2 x Per Day/30 Days Discharge Instructions: Apply Unnaboot as directed. Wound #8 - Calcaneus Wound Laterality:  Left, Distal Cleanser: Soap and Water 2 x Per Day/30 Days Discharge Instructions: May shower and wash wound with dial antibacterial soap and water prior to dressing change. Cleanser: Wound Cleanser 2 x Per Day/30 Days Discharge Instructions: Cleanse the wound with wound cleanser prior to applying a clean dressing using gauze sponges, not tissue or cotton balls. Cleanser: Normal Saline (Home Health) (Generic) 2 x Per Day/30 Days Discharge Instructions: Cleanse the wound with Normal Saline prior to applying a clean dressing using gauze sponges, not tissue or cotton balls. Prim Dressing: Hydrofera Blue Classic Foam, 4x4  in (Home Health) 2 x Per Day/30 Days ary Discharge Instructions: Moisten with saline prior to applying to wound bed Secondary Dressing: Woven Gauze Sponge, Non-Sterile 4x4 in (Home Health) (Generic) 2 x Per Day/30 Days Discharge Instructions: Apply over primary dressing as directed. Secondary Dressing: ABD Pad, 5x9 (Home Health) 2 x Per Day/30 Days Discharge Instructions: Apply over primary dressing as directed. Compression Wrap: Unnaboot w/Calamine, 4x10 (in/yd) (Home Health) 2 x Per Day/30 Days Discharge Instructions: Apply Unnaboot as directed. Electronic Signature(s) Signed: 02/20/2021 5:22:30 PM By: Linton Ham MD Signed: 02/20/2021 5:43:17 PM By: Rhae Hammock RN Entered By: Rhae Hammock on 02/20/2021 14:04:50 -------------------------------------------------------------------------------- Problem List Details Patient Name: Date of Service: Sonya Chambers. 02/20/2021 12:30 PM Medical Record Number: [id number] Patient Account Number: [id number] Date of Birth/Sex: Treating RN: 1941/03/06 (80 y.o. Sonya Goodwin, Sonya Goodwin Primary Care Provider: Nicholes Stairs Other Clinician: Referring Provider: Treating Provider/Extender: Nyra Jabs in Treatment: 25 Active Problems ICD-10 Encounter Code Description Active Date MDM Diagnosis [phone] Non-pressure chronic ulcer of other part of left lower leg limited to breakdown 08/29/2020 No Yes of skin L97.819 Non-pressure chronic ulcer of other part of right lower leg with unspecified 09/26/2020 No Yes severity I87.2 Venous insufficiency (chronic) (peripheral) 08/29/2020 No Yes Z92.241 Personal history of systemic steroid therapy 08/29/2020 No Yes E11.621 Type 2 diabetes mellitus with foot ulcer 12/05/2020 No Yes Inactive Problems ICD-10 Code Description Active Date Inactive Date L97.428 Non-pressure chronic ulcer of left heel and midfoot with other specified severity 12/05/2020 12/05/2020 L97.418  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 12/05/2020 12/05/2020 Resolved Problems Electronic Signature(s) Signed: 02/20/2021 5:22:30 PM By: Linton Ham MD Entered By: Linton Ham on 02/20/2021 14:01:16 -------------------------------------------------------------------------------- Progress Note Details Patient Name: Date of Service: Sonya Chambers. 02/20/2021 12:30 PM Medical Record Number: [id number] Patient Account Number: [id number] Date of Birth/Sex: Treating RN: 1941-04-05 (80 y.o. Sonya Goodwin, Sonya Goodwin Primary Care Provider: Nicholes Stairs Other Clinician: Referring Provider: Treating Provider/Extender: Nyra Jabs in Treatment: 25 Subjective History of Present Illness (HPI) We think they have been using silver alginateADMISSION 08/29/2020 This is a 80 year old woman who has wounds on her left lower leg mid aspect. She states that these have been present since June when she was hospitalized with a UTI, delirium possibly medication issues. I do not see any mention of the wounds on her left leg at that time. In any case these develop sometime after this. The patient is followed by Dr. Jacelyn Grip at [place] [facility]. She has been using bacitracin ointment to the wounds. She has [facility] home health although I am not exactly sure what they are doing. Her husband is angry about a bill from home health again we were not able to help him exactly. The patient has chronic sarcoidosis and adrenal insufficiency she has been on longstanding prednisone. She has all the  classic skin features of chronic steroid use. I think this is the primary issue for these wounds. They have also noted weeping fluid coming out of the wounds and even weeping draining fluid on the right leg although we were not able to define any wound on the right leg. She could very well have a component of chronic venous insufficiency as well. Past  medical history includes sarcoidosis, type 2 diabetes with a recent hemoglobin A1c of 6.3, diabetic neuropathy, stage III P chronic renal failure, hypertension, adrenal insufficiency. ABI on the right noncompressible on the left at 1.02 9/14 2 small punched out areas of the left anterior mid tibia. We have been using Iodoflex. Better looking wound surfaces 9/28; the 2 areas on the left anterior mid tibia are about the same. She has a new weeping area on the right side just medial to the tibia. I think all of these wounds are weeping edema fluid from a collection of fluid in the upper third of her lower leg. The skin distally is more fibrosed and there is no room for weeping edema fluid. I wanted to put her in 3 layer compression versus kerlix and Coban but she will not agree to it 10/12; 2-week follow-up. She has the 2 areas on the left anterior mid tibia. There is scissor injury now on the left medial calf. In a single area on the right medial lower extremity. All of these are roughly the same small punched out surfaces. Weeping edema fluid. She does not tolerate anything more than kerlix Coban and even with this she finds this too tight often making her husband cut these off. I had ordered Iodoflex to these wounds but according to her husband that is not what home health is using. 11/9; patient missed her last appointment so she has not been seen here in almost a month. She has the 2 punched-out areas on the left mid tibia 1 medial and 1 lateral a small excoriation a little more superior and lateral also on the left but she has also punched out areas on the right that she did not have last time I am not exactly sure how this happened. She complains about pain in her feet at night especially in the morning. Not really claudication but I was I was concerned. Her edema is satisfactorily concerned and the kerlix and Coban 12/7; again a monthly follow-up. I am not exactly sure how we got into this  monthly pattern. She does have home health coming out 3 times a week. She did manage to get the arterial studies done that I ordered last time. On the right her ABI was noncompressible although she had triphasic waveforms at the dorsalis pedis her great toe pressure was unobtainable. On the left she had triphasic waveforms at the PTA and dorsalis pedis but again absent great toe waveforms. Segmental waveform analysis showed suggestion of posterior tibial artery occlusion on the right but a biphasic triphasic waveform with a normal normal examination on the left. The patient describes a lot of pain it is not clearly claudication but certainly there is a possibility here. She also has what I think is longstanding Raynaud's dating back to when she was a teenager She has new wounds on the left Achilles heel on the right Achilles heel from last time otherwise her wounds are about the same 1/4; the patient has an appointment with vascular on 01/09/2021. Basically I am asking if there is macrovascular issues that need to be addressed she still  complains of a lot of pain in her feet and heels when she walks although I am not sure this is claudication. She also has longstanding Raynaud's by her description dating back to when she was a teenager. She has small punched-out areas on the left anterior lower x2, left anterior leg left heel and right heel. She tells me that the home health nurse stopped wrapping the right leg about 2 weeks ago there is a lot of edema in this leg threatening to break open. Small wound on the right lateral leg. 1/20; the patient's appointment with vascular had to be delayed. She has no open wounds on the right leg on the left she has an area on the left Achilles heel, left medial calf 2 areas anteriorly. All of these small punched out areas. 2/8; patient's vascular appointment is next week this is in consultation with regards to the lower extremity blood supply. She has 2 new wounds one  on the right lateral and one on the left posterior. The 3 wounds on the left anterior are about the same. We have been using silver collagen with a kerlix and Coban wrap. 2/22; patient saw Dr. Stanford Breed on 2/15. He felt she had venous stasis ulcerations. Did not seem to think she had any significant arterial issues. He put her in Unna boot compressions although home health took this off and put her back in kerlix Coban. She comes in with an area on the left lateral upper calf that looks like a skin tear. Almost circumferential undermining by 4 to 5 cm. The other major areas on the left lateral calf 3 small punched-out areas that interconnect she has a small punched-out area on the left anterior lower leg. I did not see anything on either heel today Objective Constitutional Vitals Time Taken: 12:51 PM, Height: 66 in, Weight: 141 lbs, BMI: 22.8, Temperature: 98.6 F, Pulse: 111 bpm, Respiratory Rate: 18 breaths/min, Blood Pressure: 161/72 mmHg. Integumentary (Hair, Skin) Wound #1 status is Open. Original cause of wound was Gradually Appeared. The date acquired was: 06/15/2020. The wound has been in treatment 25 weeks. The wound is located on the Left,Anterior Lower Leg. The wound measures 0.3cm length x 0.4cm width x 0.1cm depth; 0.094cm^2 area and 0.009cm^3 volume. There is Fat Layer (Subcutaneous Tissue) exposed. There is no tunneling or undermining noted. There is a small amount of serosanguineous drainage noted. The wound margin is flat and intact. There is medium (34-66%) red granulation within the wound bed. There is a medium (34-66%) amount of necrotic tissue within the wound bed including Adherent Slough. Wound #10 status is Open. Original cause of wound was Blister. The date acquired was: 01/17/2021. The wound has been in treatment 4 weeks. The wound is located on the Left,Proximal,Anterior Lower Leg. The wound measures 0.6cm length x 0.6cm width x 0.2cm depth; 0.283cm^2 area and 0.057cm^3  volume. There is Fat Layer (Subcutaneous Tissue) exposed. There is no tunneling or undermining noted. There is a small amount of serosanguineous drainage noted. The wound margin is distinct with the outline attached to the wound base. There is large (67-100%) pink granulation within the wound bed. There is a small (1- 33%) amount of necrotic tissue within the wound bed including Adherent Slough. Wound #11 status is Open. Original cause of wound was Gradually Appeared. The date acquired was: 02/03/2021. The wound has been in treatment 2 weeks. The wound is located on the Left,Posterior Lower Leg. The wound measures 3.1cm length x 3.5cm width x 0.2cm depth;  8.522cm^2 area and 1.704cm^3 volume. There is Fat Layer (Subcutaneous Tissue) exposed. There is no tunneling noted, however, there is undermining starting at 12:00 and ending at 12:00 with a maximum distance of 1.2cm. There is a medium amount of serosanguineous drainage noted. The wound margin is well defined and not attached to the wound base. There is large (67-100%) red granulation within the wound bed. There is no necrotic tissue within the wound bed. Wound #12 status is Healed - Epithelialized. Original cause of wound was Gradually Appeared. The date acquired was: 02/06/2021. The wound has been in treatment 2 weeks. The wound is located on the Right,Lateral Lower Leg. The wound measures 0cm length x 0cm width x 0cm depth; 0cm^2 area and 0cm^3 volume. There is Fat Layer (Subcutaneous Tissue) exposed. There is no tunneling or undermining noted. There is a small amount of serosanguineous drainage noted. The wound margin is distinct with the outline attached to the wound base. There is large (67-100%) pink granulation within the wound bed. There is a small (1-33%) amount of necrotic tissue within the wound bed. Wound #13 status is Open. Original cause of wound was Gradually Appeared. The date acquired was: 02/15/2021. The wound is located on the  Right,Lateral Lower Leg. The wound measures 4cm length x 0.5cm width x 0.2cm depth; 1.571cm^2 area and 0.314cm^3 volume. There is Fat Layer (Subcutaneous Tissue) exposed. There is no tunneling noted, however, there is undermining starting at 12:00 and ending at 12:00 with a maximum distance of 4cm. There is a medium amount of serosanguineous drainage noted. The wound margin is distinct with the outline attached to the wound base. There is large (67-100%) red, pink granulation within the wound bed. There is a small (1-33%) amount of necrotic tissue within the wound bed including Eschar and Adherent Slough. Wound #6 status is Open. Original cause of wound was Gradually Appeared. The date acquired was: 11/07/2020. The wound has been in treatment 15 weeks. The wound is located on the Left,Medial Lower Leg. The wound measures 0.2cm length x 0.2cm width x 0.1cm depth; 0.031cm^2 area and 0.003cm^3 volume. There is Fat Layer (Subcutaneous Tissue) exposed. There is no tunneling or undermining noted. There is a small amount of serosanguineous drainage noted. The wound margin is distinct with the outline attached to the wound base. There is large (67-100%) pink granulation within the wound bed. There is no necrotic tissue within the wound bed. Wound #8 status is Open. Original cause of wound was Gradually Appeared. The date acquired was: 12/05/2020. The wound has been in treatment 11 weeks. The wound is located on the Left,Distal Calcaneus. The wound measures 0.2cm length x 1.2cm width x 0.1cm depth; 0.188cm^2 area and 0.019cm^3 volume. There is Fat Layer (Subcutaneous Tissue) exposed. There is no tunneling or undermining noted. There is a small amount of serous drainage noted. The wound margin is distinct with the outline attached to the wound base. There is large (67-100%) pink granulation within the wound bed. There is no necrotic tissue within the wound bed. 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 There was a Haematologist Compression Therapy Procedure by Rhae Hammock, RN.  Post procedure Diagnosis Wound #1: Same as Pre-Procedure Wound #10 Pre-procedure diagnosis of Wound #10 is a Diabetic Wound/Ulcer of the Lower Extremity located on the Left,Proximal,Anterior Lower Leg . There was a Haematologist Compression Therapy Procedure by Rhae Hammock, RN. Post procedure Diagnosis Wound #10: Same as Pre-Procedure Wound #11 Pre-procedure diagnosis of Wound #11 is a Venous Leg Ulcer located on the Left,Posterior Lower Leg . There was a Haematologist Compression Therapy Procedure by Rhae Hammock, RN. Post procedure Diagnosis Wound #11: Same as Pre-Procedure Wound #12 Pre-procedure diagnosis of Wound #12 is a Venous Leg Ulcer located on the Right,Lateral Lower Leg . There was a Haematologist Compression Therapy Procedure by Rhae Hammock, RN. Post procedure Diagnosis Wound #12: Same as Pre-Procedure Wound #6 Pre-procedure diagnosis of Wound #6 is a Venous Leg Ulcer located on the Left,Medial Lower Leg . There was a Haematologist Compression Therapy Procedure by Rhae Hammock, RN. Post procedure Diagnosis Wound #6: Same as Pre-Procedure Wound #8 Pre-procedure diagnosis of Wound #8 is a Diabetic Wound/Ulcer of the Lower Extremity located on the Left,Distal Calcaneus . There was a Haematologist Compression Therapy Procedure by Rhae Hammock, RN. Post procedure Diagnosis Wound #8: Same as Pre-Procedure Plan Follow-up Appointments: Return Appointment in 1 week. Bathing/ Shower/  Hygiene: May shower with protection but do not get wound dressing(s) wet. Edema Control - Lymphedema / SCD / Other: Elevate legs to the level of the heart or above for 30 minutes daily and/or when sitting, a frequency of: Avoid standing for long periods of time. Exercise regularly Additional Orders / Instructions: Other: - ***Please pad the left dorsal foot**** Home Health: New wound care orders this week; continue Home Health for wound care. May utilize formulary equivalent dressing for wound treatment orders unless otherwise specified. [facility]- see pt. this Friday and next Friday. Pt. will be coming [facility] next tuesday. WOUND #1: - Lower Leg Wound Laterality: Left, Anterior Cleanser: Soap and Water 2 x Per Day/30 Days Discharge Instructions: May shower and wash wound with dial antibacterial soap and water prior to dressing change. Cleanser: Wound Cleanser 2 x Per Day/30 Days Discharge Instructions: Cleanse the wound with wound cleanser prior to applying a clean dressing using gauze sponges, not tissue or cotton balls. Cleanser: Normal Saline (Home Health) (Generic) 2 x Per Day/30 Days Discharge Instructions: Cleanse the wound with Normal Saline prior to applying a clean dressing using gauze sponges, not tissue or cotton balls. Prim Dressing: Hydrofera Blue Classic Foam, 4x4 in (Home Health) 2 x Per Day/30 Days ary Discharge Instructions: Moisten with saline prior to applying to wound bed Secondary Dressing: Woven Gauze Sponge, Non-Sterile 4x4 in (Home Health) (Generic) 2 x Per Day/30 Days Discharge Instructions: Apply over primary dressing as directed. Secondary Dressing: ABD Pad, 5x9 (Home Health) 2 x Per Day/30 Days Discharge Instructions: Apply over primary dressing as directed. Com pression Wrap: Unnaboot w/Calamine, 4x10 (in/yd) (Home Health) 2 x Per Day/30 Days Discharge Instructions: Apply Unnaboot as directed. WOUND #10: - Lower Leg Wound Laterality: Left, Anterior,  Proximal Cleanser: Soap and Water 2 x Per Day/30 Days Discharge Instructions: May shower and wash wound with dial antibacterial soap and water prior to dressing change. Cleanser: Wound Cleanser 2 x Per Day/30 Days Discharge Instructions: Cleanse the wound with wound cleanser prior to applying a clean dressing using gauze sponges, not tissue or cotton balls. Cleanser: Normal Saline (Home Health) (Generic) 2 x Per Day/30 Days Discharge Instructions: Cleanse the wound with Normal Saline prior to  applying a clean dressing using gauze sponges, not tissue or cotton balls. Prim Dressing: Hydrofera Blue Classic Foam, 4x4 in (Home Health) 2 x Per Day/30 Days ary Discharge Instructions: Moisten with saline prior to applying to wound bed Secondary Dressing: Woven Gauze Sponge, Non-Sterile 4x4 in (Home Health) (Generic) 2 x Per Day/30 Days Discharge Instructions: Apply over primary dressing as directed. Secondary Dressing: ABD Pad, 5x9 (Home Health) 2 x Per Day/30 Days Discharge Instructions: Apply over primary dressing as directed. Com pression Wrap: Unnaboot w/Calamine, 4x10 (in/yd) (Home Health) 2 x Per Day/30 Days Discharge Instructions: Apply Unnaboot as directed. WOUND #11: - Lower Leg Wound Laterality: Left, Posterior Cleanser: Soap and Water 2 x Per Day/30 Days Discharge Instructions: May shower and wash wound with dial antibacterial soap and water prior to dressing change. Cleanser: Wound Cleanser 2 x Per Day/30 Days Discharge Instructions: Cleanse the wound with wound cleanser prior to applying a clean dressing using gauze sponges, not tissue or cotton balls. Cleanser: Normal Saline (Home Health) (Generic) 2 x Per Day/30 Days Discharge Instructions: Cleanse the wound with Normal Saline prior to applying a clean dressing using gauze sponges, not tissue or cotton balls. Prim Dressing: Hydrofera Blue Classic Foam, 4x4 in (Home Health) 2 x Per Day/30 Days ary Discharge Instructions: Moisten with  saline prior to applying to wound bed Secondary Dressing: Woven Gauze Sponge, Non-Sterile 4x4 in (Home Health) (Generic) 2 x Per Day/30 Days Discharge Instructions: Apply over primary dressing as directed. Secondary Dressing: ABD Pad, 5x9 (Home Health) 2 x Per Day/30 Days Discharge Instructions: Apply over primary dressing as directed. Com pression Wrap: Unnaboot w/Calamine, 4x10 (in/yd) (Home Health) 2 x Per Day/30 Days Discharge Instructions: Apply Unnaboot as directed. WOUND #13: - Lower Leg Wound Laterality: Right, Lateral Cleanser: Soap and Water 2 x Per Day/30 Days Discharge Instructions: May shower and wash wound with dial antibacterial soap and water prior to dressing change. Cleanser: Wound Cleanser 2 x Per Day/30 Days Discharge Instructions: Cleanse the wound with wound cleanser prior to applying a clean dressing using gauze sponges, not tissue or cotton balls. Cleanser: Normal Saline (Home Health) (Generic) 2 x Per Day/30 Days Discharge Instructions: Cleanse the wound with Normal Saline prior to applying a clean dressing using gauze sponges, not tissue or cotton balls. Prim Dressing: Hydrofera Blue Classic Foam, 4x4 in (Home Health) 2 x Per Day/30 Days ary Discharge Instructions: Moisten with saline prior to applying to wound bed Secondary Dressing: Woven Gauze Sponge, Non-Sterile 4x4 in (Home Health) (Generic) 2 x Per Day/30 Days Discharge Instructions: Apply over primary dressing as directed. Secondary Dressing: ABD Pad, 5x9 (Home Health) 2 x Per Day/30 Days Discharge Instructions: Apply over primary dressing as directed. Com pression Wrap: Unnaboot w/Calamine, 4x10 (in/yd) (Home Health) 2 x Per Day/30 Days Discharge Instructions: Apply Unnaboot as directed. WOUND #6: - Lower Leg Wound Laterality: Left, Medial Cleanser: Soap and Water 2 x Per Day/30 Days Discharge Instructions: May shower and wash wound with dial antibacterial soap and water prior to dressing change. Cleanser:  Wound Cleanser 2 x Per Day/30 Days Discharge Instructions: Cleanse the wound with wound cleanser prior to applying a clean dressing using gauze sponges, not tissue or cotton balls. Cleanser: Normal Saline (Home Health) (Generic) 2 x Per Day/30 Days Discharge Instructions: Cleanse the wound with Normal Saline prior to applying a clean dressing using gauze sponges, not tissue or cotton balls. Prim Dressing: Hydrofera Blue Classic Foam, 4x4 in (Home Health) 2 x Per Day/30 Days ary Discharge Instructions: Moisten  with saline prior to applying to wound bed Secondary Dressing: Woven Gauze Sponge, Non-Sterile 4x4 in [facility]) (Generic) 2 x Per Day/30 Days Discharge Instructions: Apply over primary dressing as directed. Secondary Dressing: ABD Pad, 5x9 (Home Health) 2 x Per Day/30 Days Discharge Instructions: Apply over primary dressing as directed. Com pression Wrap: Unnaboot w/Calamine, 4x10 (in/yd) (Home Health) 2 x Per Day/30 Days Discharge Instructions: Apply Unnaboot as directed. WOUND #8: - Calcaneus Wound Laterality: Left, Distal Cleanser: Soap and Water 2 x Per Day/30 Days Discharge Instructions: May shower and wash wound with dial antibacterial soap and water prior to dressing change. Cleanser: Wound Cleanser 2 x Per Day/30 Days Discharge Instructions: Cleanse the wound with wound cleanser prior to applying a clean dressing using gauze sponges, not tissue or cotton balls. Cleanser: Normal Saline (Home Health) (Generic) 2 x Per Day/30 Days Discharge Instructions: Cleanse the wound with Normal Saline prior to applying a clean dressing using gauze sponges, not tissue or cotton balls. Prim Dressing: Hydrofera Blue Classic Foam, 4x4 in (Home Health) 2 x Per Day/30 Days ary Discharge Instructions: Moisten with saline prior to applying to wound bed Secondary Dressing: Woven Gauze Sponge, Non-Sterile 4x4 in (Home Health) (Generic) 2 x Per Day/30 Days Discharge Instructions: Apply over primary  dressing as directed. Secondary Dressing: ABD Pad, 5x9 (Home Health) 2 x Per Day/30 Days Discharge Instructions: Apply over primary dressing as directed. Com pression Wrap: Unnaboot w/Calamine, 4x10 (in/yd) (Home Health) 2 x Per Day/30 Days Discharge Instructions: Apply Unnaboot as directed. 1. I am going to put [facility] on the areas including the right lateral left lateral and left anterior lower leg 2. I am concerned about doing extensive debridements here. She has paperthin skin likely secondary to steroid induced skin changes and chronic venous insufficiency 3. Dr. Stanford Breed did not feel she had any significant macrovascular disease. 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 4. She actually felt the Unna boots felt better Electronic Signature(s) Signed: 02/20/2021 5:22:30 PM By: Linton Ham MD Entered By: Linton Ham on 02/20/2021 14:07:43 -------------------------------------------------------------------------------- SuperBill Details Patient Name: Date of Service: Crafton, Sonya TRICIA S. 02/20/2021 Medical Record Number: [id number] Patient Account Number: [id number] Date of Birth/Sex: Treating RN: [birth year] (80 y.o. Sonya Goodwin, Sonya Goodwin Primary Care Provider: Nicholes Stairs Other Clinician: Referring Provider: Treating Provider/Extender: Nyra Jabs in Treatment: 25 Diagnosis Coding ICD-10 Codes Code Description [id number] Non-pressure chronic ulcer of other part of left lower leg limited to breakdown of skin L97.819 Non-pressure chronic ulcer of other part of right lower leg with unspecified severity I87.2 Venous insufficiency (chronic) (peripheral) Z92.241 Personal history of systemic steroid therapy E11.621 Type 2 diabetes mellitus with foot ulcer Facility Procedures CPT4 Code: [id number] Description: 29580 - APPLY UNNA BOOT/PROFO BILATERAL Modifier: Quantity:  1 Physician Procedures : CPT4 Code Description Modifier [id number] - WC PHYS LEVEL 3 - EST PT ICD-10 Diagnosis Description L97.821 Non-pressure chronic ulcer of other part of left lower leg limited to breakdown of skin L97.819 Non-pressure chronic ulcer of other part of  right lower leg with unspecified severity Quantity: 1 Electronic Signature(s) Signed: 02/20/2021 5:22:30 PM By: Linton Ham MD Entered By: Linton Ham on 02/20/2021 14:08:08

## 2021-02-21 DIAGNOSIS — S81812A Laceration without foreign body, left lower leg, initial encounter: Secondary | ICD-10-CM | POA: Diagnosis not present

## 2021-02-21 DIAGNOSIS — I872 Venous insufficiency (chronic) (peripheral): Secondary | ICD-10-CM | POA: Diagnosis not present

## 2021-02-21 DIAGNOSIS — S81811A Laceration without foreign body, right lower leg, initial encounter: Secondary | ICD-10-CM | POA: Diagnosis not present

## 2021-02-21 NOTE — Progress Notes (Signed)
ANAHLI, ARVANITIS ([id number]) Visit Report for 02/20/2021 Arrival Information Details Patient Name: Date of Service: [place], [name] 02/20/2021 12:30 PM Medical Record Number: [id number] Patient Account Number: [id number] Date of Birth/Sex: Treating RN: [birth year] (80 y.o. Tonita Phoenix, Lauren Primary Care Provider: Nicholes Stairs Other Clinician: Referring Provider: Treating Provider/Extender: Nyra Jabs in Treatment: 25 Visit Information History Since Last Visit Added or deleted any medications: No Patient Arrived: Gilford Rile Any new allergies or adverse reactions: No Arrival Time: 12:50 Had a fall or experienced change in No Accompanied By: husband activities of daily living that may affect Transfer Assistance: None risk of falls: Patient Identification Verified: Yes Signs or symptoms of abuse/neglect since last visito No Secondary Verification Process Completed: Yes Hospitalized since last visit: No Patient Requires Transmission-Based Precautions: No Implantable device outside of the clinic excluding No Patient Has Alerts: No cellular tissue based products placed in the center since last visit: Has Dressing in Place as Prescribed: Yes Pain Present Now: No Electronic Signature(s) Signed: 02/21/2021 10:45:54 AM By: Sandre Kitty Entered By: Sandre Kitty on 02/20/2021 12:51:23 -------------------------------------------------------------------------------- Compression Therapy Details Patient Name: Date of Service: [name] 02/20/2021 12:30 PM Medical Record Number: [id number] Patient Account Number: [id number] Date of Birth/Sex: Treating RN: [birth year] (80 y.o. Tonita Phoenix, Lauren Primary Care Provider: Nicholes Stairs Other Clinician: Referring Provider: Treating Provider/Extender: Nyra Jabs in Treatment: 25 Compression Therapy Performed for Wound Assessment: Wound #1 Left,Anterior Lower  Leg Performed By: Clinician Rhae Hammock, RN Compression Type: Rolena Infante Post Procedure Diagnosis Same as Pre-procedure Electronic Signature(s) Signed: 02/20/2021 5:43:17 PM By: Rhae Hammock RN Entered By: Rhae Hammock on 02/20/2021 13:49:04 -------------------------------------------------------------------------------- Compression Therapy Details Patient Name: Date of Service: [name] 02/20/2021 12:30 PM Medical Record Number: [id number] Patient Account Number: [id number] Date of Birth/Sex: Treating RN: May 24, 1941 (80 y.o. Tonita Phoenix, Lauren Primary Care Provider: Nicholes Stairs Other Clinician: Referring Provider: Treating Provider/Extender: Nyra Jabs in Treatment: 25 Compression Therapy Performed for Wound Assessment: Wound #10 Left,Proximal,Anterior Lower Leg Performed By: Clinician Rhae Hammock, RN Compression Type: Rolena Infante Post Procedure Diagnosis Same as Pre-procedure Electronic Signature(s) Signed: 02/20/2021 5:43:17 PM By: Rhae Hammock RN Entered By: Rhae Hammock on 02/20/2021 13:49:04 -------------------------------------------------------------------------------- Compression Therapy Details Patient Name: Date of Service: [name] 02/20/2021 12:30 PM Medical Record Number: [id number] Patient Account Number: [id number] Date of Birth/Sex: Treating RN: 1941/03/17 (80 y.o. Tonita Phoenix, Lauren Primary Care Provider: Nicholes Stairs Other Clinician: Referring Provider: Treating Provider/Extender: Nyra Jabs in Treatment: 25 Compression Therapy Performed for Wound Assessment: Wound #11 Left,Posterior Lower Leg Performed By: Clinician Rhae Hammock, RN Compression Type: Rolena Infante Post Procedure Diagnosis Same as Pre-procedure Electronic Signature(s) Signed: 02/20/2021 5:43:17 PM By: Rhae Hammock RN Entered By: Rhae Hammock on 02/20/2021  13:49:04 -------------------------------------------------------------------------------- Compression Therapy Details Patient Name: Date of Service: [name] 02/20/2021 12:30 PM Medical Record Number: [id number] Patient Account Number: [id number] Date of Birth/Sex: Treating RN: 1941-04-29 (80 y.o. Tonita Phoenix, Lauren Primary Care Provider: Nicholes Stairs Other Clinician: Referring Provider: Treating Provider/Extender: Nyra Jabs in Treatment: 25 Compression Therapy Performed for Wound Assessment: Wound #12 Right,Lateral Lower Leg Performed By: Clinician Rhae Hammock, RN Compression Type: Rolena Infante Post Procedure Diagnosis Same as Pre-procedure Electronic Signature(s) Signed: 02/20/2021 5:43:17 PM By: Rhae Hammock RN Signed: 02/20/2021 5:43:17 PM By: Rhae Hammock RN Entered By: Rhae Hammock on 02/20/2021 13:49:04 --------------------------------------------------------------------------------  Compression Therapy Details Patient Name: Date of Service: SANAM, [place] 02/20/2021 12:30 PM Medical Record Number: [id number] Patient Account Number: [id number] Date of Birth/Sex: Treating RN: 12-Aug-1941 (80 y.o. Tonita Phoenix, Lauren Primary Care Provider: Nicholes Stairs Other Clinician: Referring Provider: Treating Provider/Extender: Nyra Jabs in Treatment: 25 Compression Therapy Performed for Wound Assessment: Wound #6 Left,Medial Lower Leg Performed By: Clinician Rhae Hammock, RN Compression Type: Rolena Infante Post Procedure Diagnosis Same as Pre-procedure Electronic Signature(s) Signed: 02/20/2021 5:43:17 PM By: Rhae Hammock RN Entered By: Rhae Hammock on 02/20/2021 13:49:04 -------------------------------------------------------------------------------- Compression Therapy Details Patient Name: Date of Service: [name] 02/20/2021 12:30 PM Medical Record  Number: [id number] Patient Account Number: [id number] Date of Birth/Sex: Treating RN: 05/26/1941 (80 y.o. Tonita Phoenix, Lauren Primary Care Provider: Nicholes Stairs Other Clinician: Referring Provider: Treating Provider/Extender: Nyra Jabs in Treatment: 25 Compression Therapy Performed for Wound Assessment: Wound #8 Left,Distal Calcaneus Performed By: Clinician Rhae Hammock, RN Compression Type: Rolena Infante Post Procedure Diagnosis Same as Pre-procedure Electronic Signature(s) Signed: 02/20/2021 5:43:17 PM By: Rhae Hammock RN Entered By: Rhae Hammock on 02/20/2021 13:49:04 -------------------------------------------------------------------------------- Encounter Discharge Information Details Patient Name: Date of Service: [name] 02/20/2021 12:30 PM Medical Record Number: [id number] Patient Account Number: [id number] Date of Birth/Sex: Treating RN: 1941/06/26 (80 y.o. [name] Primary Care Provider: Nicholes Stairs Other Clinician: Referring Provider: Treating Provider/Extender: Nyra Jabs in Treatment: 25 Encounter Discharge Information Items Discharge Condition: Stable Ambulatory Status: Wheelchair Discharge Destination: Home Transportation: Private Auto Accompanied By: alone Schedule Follow-up Appointment: Yes Clinical Summary of Care: Patient Declined Electronic Signature(s) Signed: 02/20/2021 5:33:10 PM By: Levan Hurst RN, BSN Entered By: Levan Hurst on 02/20/2021 17:03:23 -------------------------------------------------------------------------------- Lower Extremity Assessment Details Patient Name: Date of Service: [name] 02/20/2021 12:30 PM Medical Record Number: [id number] Patient Account Number: [id number] Date of Birth/Sex: Treating RN: 1941-02-27 (80 y.o. [name] Primary Care Provider: Nicholes Stairs Other Clinician: Referring  Provider: Treating Provider/Extender: Nyra Jabs in Treatment: 25 Edema Assessment Assessed: [name]: No] [name]: No] Edema: [Left: Yes] [Right: Yes] Calf Left: Right: Point of Measurement: From Medial Instep 19.5 cm 19.5 cm Ankle Left: Right: Point of Measurement: From Medial Instep 16 cm 16.5 cm Vascular Assessment Pulses: Dorsalis Pedis Palpable: [Left:Yes] [Right:Yes] Electronic Signature(s) Signed: 02/20/2021 5:38:13 PM By: Baruch Gouty RN, BSN Entered By: Baruch Gouty on 02/20/2021 13:30:55 -------------------------------------------------------------------------------- Multi Wound Chart Details Patient Name: Date of Service: [name] 02/20/2021 12:30 PM Medical Record Number: [id number] Patient Account Number: [id number] Date of Birth/Sex: Treating RN: 1941-05-06 (80 y.o. Tonita Phoenix, Lauren Primary Care Provider: Nicholes Stairs Other Clinician: Referring Provider: Treating Provider/Extender: Nyra Jabs in Treatment: 25 Vital Signs Height(in): 25 Pulse(bpm): 111 Weight(lbs): 141 Blood Pressure(mmHg): 161/72 Body Mass Index(BMI): 23 Temperature(F): 98.6 Respiratory Rate(breaths/min): 18 Photos: [1:No Photos Left, Anterior Lower Leg] [10:No Photos Left, Proximal, Anterior Lower Leg] [11:No Photos Left, Posterior Lower Leg] Wound Location: [1:Gradually Appeared] [10:Blister] [11:Gradually Appeared] Wounding Event: [1:Venous Leg Ulcer] [10:Diabetic Wound/Ulcer of the Lower] [11:Venous Leg Ulcer] Primary Etiology: [1:N/A] [10:Extremity N/A] [11:N/A] Secondary Etiology: [1:Cataracts, Glaucoma, Hypertension, Cataracts, Glaucoma, Hypertension,] [11:Cataracts, Glaucoma, Hypertension,] Comorbid History: [1:Type II Diabetes, Osteoarthritis, Type II Diabetes, Osteoarthritis, Neuropathy 06/15/2020] [10:Neuropathy 01/17/2021] [11:Type II Diabetes, Osteoarthritis, Neuropathy 02/03/2021] Date Acquired: [1:25]  [10:4] [11:2] Weeks of Treatment: [1:Open] [10:Open] [11:Open] Wound Status: [1:0.3x0.4x0.1] [10:0.6x0.6x0.2] [11:3.1x3.5x0.2] Measurements L x W  x D (cm) [1:0.094] [10:0.283] [11:8.522] A (cm) : rea [1:0.009] [10:0.057] [11:1.704] Volume (cm) : [1:93.40%] [10:-44.40%] [11:-2363.00%] % Reduction in A rea: [1:93.60%] [10:-185.00%] [11:-4768.60%] % Reduction in Volume: [11:12] Starting Position 1 (o'clock): [11:12] Ending Position 1 (o'clock): [11:1.2] Maximum Distance 1 (cm): [1:No] [10:No] [11:Yes] Undermining: [1:Full Thickness Without Exposed] [10:Grade 2] [11:Full Thickness Without Exposed] Classification: [1:Support Structures Small] [10:Small] [11:Support Structures Medium] Exudate A mount: [1:Serosanguineous] [10:Serosanguineous] [11:Serosanguineous] Exudate Type: [1:red, brown] [10:red, brown] [11:red, brown] Exudate Color: [1:Flat and Intact] [10:Distinct, outline attached] [11:Well defined, not attached] Wound Margin: [1:Medium (34-66%)] [10:Large (67-100%)] [11:Large (67-100%)] Granulation Amount: [1:Red] [10:Pink] [11:Red] Granulation Quality: [1:Medium (34-66%)] [10:Small (1-33%)] [11:None Present (0%)] Necrotic Amount: [1:Adherent Slough] [10:Adherent Slough] [11:N/A] Necrotic Tissue: [1:Fat Layer (Subcutaneous Tissue): Yes Fat Layer (Subcutaneous Tissue): Yes Fat Layer (Subcutaneous Tissue): Yes] Exposed Structures: [1:Fascia: No Tendon: No Muscle: No Joint: No Bone: No Small (1-33%)] [10:Fascia: No Tendon: No Muscle: No Joint: No Bone: No Small (1-33%)] [11:Fascia: No Tendon: No Muscle: No Joint: No Bone: No None] Epithelialization: [1:Compression Therapy] [10:Compression Therapy] [11:Compression Therapy] Wound Number: [id number]  Photos: No Photos No Photos No Photos Right, Lateral Lower Leg Right, Lateral Lower Leg Left, Medial Lower Leg Wound Location: Gradually Appeared Gradually Appeared Gradually Appeared Wounding Event: Venous Leg Ulcer Skin Tear Venous Leg  Ulcer Primary Etiology: Diabetic Wound/Ulcer of the Lower N/A N/A Secondary Etiology: Extremity Cataracts, Glaucoma, Hypertension, Cataracts, Glaucoma, Hypertension, Cataracts, Glaucoma, Hypertension, Comorbid History: Type II Diabetes, Osteoarthritis, Type II Diabetes, Osteoarthritis, Type II Diabetes, Osteoarthritis, Neuropathy Neuropathy Neuropathy 02/06/2021 02/15/2021 11/07/2020 Date Acquired: 2 0 15 Weeks of Treatment: Healed - Epithelialized Open Open Wound Status: 0x0x0 4x0.5x0.2 0.2x0.2x0.1 Measurements L x W x D (cm) 0 1.571 0.031 A (cm) : rea 0 0.314 0.003 Volume (cm) : 100.00% N/A 96.00% % Reduction in A rea: 100.00% N/A 98.10% % Reduction in Volume: 12 Starting Position 1 (o'clock): 12 Ending Position 1 (o'clock): 4 Maximum Distance 1 (cm): No Yes No Undermining: Full Thickness Without Exposed Full Thickness Without Exposed Full Thickness Without Exposed Classification: Support Structures Support Structures Support Structures Small Medium Small Exudate A mount: Serosanguineous Serosanguineous Serosanguineous Exudate Type: red, brown red, brown red, brown Exudate Color: Distinct, outline attached Distinct, outline attached Distinct, outline attached Wound Margin: Large (67-100%) Large (67-100%) Large (67-100%) Granulation Amount: Pink Red, Pink Pink Granulation Quality: Small (1-33%) Small (1-33%) None Present (0%) Necrotic Amount: N/A Eschar, Adherent Slough N/A Necrotic Tissue: Fat Layer (Subcutaneous Tissue): Yes Fat Layer (Subcutaneous Tissue): Yes Fat Layer (Subcutaneous Tissue): Yes Exposed Structures: Fascia: No Fascia: No Fascia: No Tendon: No Tendon: No Tendon: No Muscle: No Muscle: No Muscle: No Joint: No Joint: No Joint: No Bone: No Bone: No Bone: No None None Small (1-33%) Epithelialization: Compression Therapy N/A Compression Therapy Procedures Performed: Wound Number: 8 N/A N/A Photos: No Photos N/A N/A Left, Distal  Calcaneus N/A N/A Wound Location: Gradually Appeared N/A N/A Wounding Event: Diabetic Wound/Ulcer of the Lower N/A N/A Primary Etiology: Extremity N/A N/A N/A Secondary Etiology: Cataracts, Glaucoma, Hypertension, N/A N/A Comorbid History: Type II Diabetes, Osteoarthritis, Neuropathy 12/05/2020 N/A N/A Date Acquired: 11 N/A N/A Weeks of Treatment: Open N/A N/A Wound Status: 0.2x1.2x0.1 N/A N/A Measurements L x W x D (cm) 0.188 N/A N/A A (cm) : rea 0.019 N/A N/A Volume (cm) : 70.10% N/A N/A % Reduction in A rea: 69.80% N/A N/A % Reduction in Volume: No N/A N/A Undermining: Grade 2 N/A N/A Classification: Small N/A N/A Exudate A mount: Serous N/A  N/A Exudate Type: amber N/A N/A Exudate Color: Distinct, outline attached N/A N/A Wound Margin: Large (67-100%) N/A N/A Granulation A mount: Pink N/A N/A Granulation Quality: None Present (0%) N/A N/A Necrotic A mount: N/A N/A N/A Necrotic Tissue: Fat Layer (Subcutaneous Tissue): Yes N/A N/A Exposed Structures: Fascia: No Tendon: No Muscle: No Joint: No Bone: No Medium (34-66%) N/A N/A Epithelialization: Compression Therapy N/A N/A Procedures Performed: Treatment Notes Electronic Signature(s) Signed: 02/20/2021 5:22:30 PM By: Linton Ham MD Signed: 02/20/2021 5:43:17 PM By: Rhae Hammock RN Entered By: Linton Ham on 02/20/2021 14:01:34 -------------------------------------------------------------------------------- Multi-Disciplinary Care Plan Details Patient Name: Date of Service: [name] 02/20/2021 12:30 PM Medical Record Number: [id number] Patient Account Number: [id number] Date of Birth/Sex: Treating RN: 06-Jun-1941 (80 y.o. Tonita Phoenix, Lauren Primary Care Provider: Nicholes Stairs Other Clinician: Referring Provider: Treating Provider/Extender: Nyra Jabs in Treatment: 25 Active Inactive Wound/Skin Impairment Nursing Diagnoses: Knowledge  deficit related to ulceration/compromised skin integrity Goals: Patient/caregiver will verbalize understanding of skin care regimen Date Initiated: 08/29/2020 Target Resolution Date: 03/31/2021 Goal Status: Active Ulcer/skin breakdown will have a volume reduction of 30% by week 4 Date Initiated: 08/29/2020 Date Inactivated: 09/26/2020 Target Resolution Date: 09/28/2020 Goal Status: Unmet Unmet Reason: comorbities Ulcer/skin breakdown will have a volume reduction of 50% by week 8 Date Initiated: 09/26/2020 Date Inactivated: 11/07/2020 Target Resolution Date: 10/28/2020 Goal Status: Unmet Unmet Reason: COMORBITIES Interventions: Assess patient/caregiver ability to obtain necessary supplies Assess patient/caregiver ability to perform ulcer/skin care regimen upon admission and as needed Assess ulceration(s) every visit Notes: Electronic Signature(s) Signed: 02/20/2021 5:43:17 PM By: Rhae Hammock RN Entered By: Rhae Hammock on 02/20/2021 13:10:55 -------------------------------------------------------------------------------- Pain Assessment Details Patient Name: Date of Service: [name] 02/20/2021 12:30 PM Medical Record Number: [id number] Patient Account Number: [id number] Date of Birth/Sex: Treating RN: 1941-10-04 (80 y.o. Tonita Phoenix, Lauren Primary Care Provider: Nicholes Stairs Other Clinician: Referring Provider: Treating Provider/Extender: Nyra Jabs in Treatment: 25 Active Problems Location of Pain Severity and Description of Pain Patient Has Paino No Site Locations Pain Management and Medication Current Pain Management: Electronic Signature(s) Signed: 02/20/2021 5:43:17 PM By: Rhae Hammock RN Signed: 02/21/2021 10:45:54 AM By: Sandre Kitty Entered By: Sandre Kitty on 02/20/2021 12:51:53 -------------------------------------------------------------------------------- Patient/Caregiver Education Details Patient  Name: Date of Service: [name] 2/22/2022andnbsp12:30 PM Medical Record Number: [id number] Patient Account Number: [id number] Date of Birth/Gender: Treating RN: 1941/03/11 (80 y.o. Tonita Phoenix, Lauren Primary Care Physician: Nicholes Stairs Other Clinician: Referring Physician: Treating Physician/Extender: Nyra Jabs in Treatment: 25 Education Assessment Education Provided To: Patient Education Topics Provided Wound/Skin Impairment: Methods: Explain/Verbal Responses: State content correctly [employer]) Signed: 02/20/2021 5:43:17 PM By: Rhae Hammock RN Entered By: Rhae Hammock on 02/20/2021 13:11:14 -------------------------------------------------------------------------------- Wound Assessment Details Patient Name: Date of Service: [name] 02/20/2021 12:30 PM Medical Record Number: [id number] Patient Account Number: [id number] Date of Birth/Sex: Treating RN: Mar 12, 1941 (80 y.o. Tonita Phoenix, Lauren Primary Care Provider: Nicholes Stairs Other Clinician: Referring Provider: Treating Provider/Extender: Nyra Jabs in Treatment: 25 Wound Status Wound Number: 1 Primary Venous Leg Ulcer Etiology: Wound Location: Left, Anterior Lower Leg Wound Open Wounding Event: Gradually Appeared Status: Date Acquired: 06/15/2020 Comorbid Cataracts, Glaucoma, Hypertension, Type II Diabetes, Weeks Of Treatment: 25 History: Osteoarthritis, Neuropathy Clustered Wound: No Wound Measurements Length: (cm) 0.3 Width: (cm) 0.4 Depth: (cm) 0.1 Area: (cm) 0.094 Volume: (cm) 0.009 % Reduction in Area: 93.4% %  Reduction in Volume: 93.6% Epithelialization: Small (1-33%) Tunneling: No Undermining: No Wound Description Classification: Full Thickness Without Exposed Support Structures Wound Margin: Flat and Intact Exudate Amount: Small Exudate Type: Serosanguineous Exudate Color: red,  brown Foul Odor After Cleansing: No Slough/Fibrino Yes Wound Bed Granulation Amount: Medium (34-66%) Exposed Structure Granulation Quality: Red Fascia Exposed: No Necrotic Amount: Medium (34-66%) Fat Layer (Subcutaneous Tissue) Exposed: Yes Necrotic Quality: Adherent Slough Tendon Exposed: No Muscle Exposed: No Joint Exposed: No Bone Exposed: No Treatment Notes Wound #1 (Lower Leg) Wound Laterality: Left, Anterior Cleanser Soap and Water Discharge Instruction: May shower and wash wound with dial antibacterial soap and water prior to dressing change. Wound Cleanser Discharge Instruction: Cleanse the wound with wound cleanser prior to applying a clean dressing using gauze sponges, not tissue or cotton balls. Normal Saline Discharge Instruction: Cleanse the wound with Normal Saline prior to applying a clean dressing using gauze sponges, not tissue or cotton balls. Peri-Wound Care Topical Primary Dressing Hydrofera Blue Classic Foam, 4x4 in Discharge Instruction: Moisten with saline prior to applying to wound bed Secondary Dressing Woven Gauze Sponge, Non-Sterile 4x4 in Discharge Instruction: Apply over primary dressing as directed. ABD Pad, 5x9 Discharge Instruction: Apply over primary dressing as directed. Secured With Compression Wrap Unnaboot w/Calamine, 4x10 (in/yd) Discharge Instruction: Apply Unnaboot as directed. Compression Stockings Add-Ons Electronic Signature(s) Signed: 02/20/2021 5:38:13 PM By: Baruch Gouty RN, BSN Signed: 02/20/2021 5:43:17 PM By: Rhae Hammock RN Entered By: Baruch Gouty on 02/20/2021 13:23:21 -------------------------------------------------------------------------------- Wound Assessment Details Patient Name: Date of Service: [name] 02/20/2021 12:30 PM Medical Record Number: [id number] Patient Account Number: [id number] Date of Birth/Sex: Treating RN: 16-Mar-1941 (80 y.o. Tonita Phoenix, Lauren Primary Care Provider:  Nicholes Stairs Other Clinician: Referring Provider: Treating Provider/Extender: Nyra Jabs in Treatment: 25 Wound Status Wound Number: 10 Primary Diabetic Wound/Ulcer of the Lower Extremity Etiology: Wound Location: Left, Proximal, Anterior Lower Leg Wound Open Wounding Event: Blister Status: Date Acquired: 01/17/2021 Comorbid Cataracts, Glaucoma, Hypertension, Type II Diabetes, Weeks Of Treatment: 4 History: Osteoarthritis, Neuropathy Clustered Wound: No Wound Measurements Length: (cm) 0.6 Width: (cm) 0.6 Depth: (cm) 0.2 Area: (cm) 0.283 Volume: (cm) 0.057 % Reduction in Area: -44.4% % Reduction in Volume: -185% Epithelialization: Small (1-33%) Tunneling: No Undermining: No Wound Description Classification: Grade 2 Wound Margin: Distinct, outline attached Exudate Amount: Small Exudate Type: Serosanguineous Exudate Color: red, brown Foul Odor After Cleansing: No Slough/Fibrino Yes Wound Bed Granulation Amount: Large (67-100%) Exposed Structure Granulation Quality: Pink Fascia Exposed: No Necrotic Amount: Small (1-33%) Fat Layer (Subcutaneous Tissue) Exposed: Yes Necrotic Quality: Adherent Slough Tendon Exposed: No Muscle Exposed: No Joint Exposed: No Bone Exposed: No Treatment Notes Wound #10 (Lower Leg) Wound Laterality: Left, Anterior, Proximal Cleanser Soap and Water Discharge Instruction: May shower and wash wound with dial antibacterial soap and water prior to dressing change. Wound Cleanser Discharge Instruction: Cleanse the wound with wound cleanser prior to applying a clean dressing using gauze sponges, not tissue or cotton balls. Normal Saline Discharge Instruction: Cleanse the wound with Normal Saline prior to applying a clean dressing using gauze sponges, not tissue or cotton balls. Peri-Wound Care Topical Primary Dressing Hydrofera Blue Classic Foam, 4x4 in Discharge Instruction: Moisten with saline prior to  applying to wound bed Secondary Dressing Woven Gauze Sponge, Non-Sterile 4x4 in Discharge Instruction: Apply over primary dressing as directed. ABD Pad, 5x9 Discharge Instruction: Apply over primary dressing as directed. Secured With Compression Wrap Unnaboot w/Calamine, 4x10 (in/yd) Discharge Instruction: Apply Unnaboot  as directed. Compression Stockings Add-Ons Electronic Signature(s) Signed: 02/20/2021 5:38:13 PM By: Baruch Gouty RN, BSN Signed: 02/20/2021 5:43:17 PM By: Rhae Hammock RN Entered By: Baruch Gouty on 02/20/2021 13:23:49 -------------------------------------------------------------------------------- Wound Assessment Details Patient Name: Date of Service: [name] 02/20/2021 12:30 PM Medical Record Number: [id number] Patient Account Number: [id number] Date of Birth/Sex: Treating RN: 1941-02-22 (80 y.o. Tonita Phoenix, Lauren Primary Care Provider: Nicholes Stairs Other Clinician: Referring Provider: Treating Provider/Extender: Nyra Jabs in Treatment: 25 Wound Status Wound Number: 11 Primary Venous Leg Ulcer Etiology: Wound Location: Left, Posterior Lower Leg Wound Open Wounding Event: Gradually Appeared Status: Date Acquired: 02/03/2021 Comorbid Cataracts, Glaucoma, Hypertension, Type II Diabetes, Weeks Of Treatment: 2 History: Osteoarthritis, Neuropathy Clustered Wound: No Wound Measurements Length: (cm) 3.1 Width: (cm) 3.5 Depth: (cm) 0.2 Area: (cm) 8.522 Volume: (cm) 1.704 % Reduction in Area: -2363% % Reduction in Volume: -4768.6% Epithelialization: None Tunneling: No Undermining: Yes Starting Position (o'clock): 12 Ending Position (o'clock): 12 Maximum Distance: (cm) 1.2 Wound Description Classification: Full Thickness Without Exposed Support Structures Wound Margin: Well defined, not attached Exudate Amount: Medium Exudate Type: Serosanguineous Exudate Color: red, brown Foul Odor After  Cleansing: No Slough/Fibrino No Wound Bed Granulation Amount: Large (67-100%) Exposed Structure Granulation Quality: Red Fascia Exposed: No Necrotic Amount: None Present (0%) Fat Layer (Subcutaneous Tissue) Exposed: Yes Tendon Exposed: No Muscle Exposed: No Joint Exposed: No Bone Exposed: No Treatment Notes Wound #11 (Lower Leg) Wound Laterality: Left, Posterior Cleanser Soap and Water Discharge Instruction: May shower and wash wound with dial antibacterial soap and water prior to dressing change. Wound Cleanser Discharge Instruction: Cleanse the wound with wound cleanser prior to applying a clean dressing using gauze sponges, not tissue or cotton balls. Normal Saline Discharge Instruction: Cleanse the wound with Normal Saline prior to applying a clean dressing using gauze sponges, not tissue or cotton balls. Peri-Wound Care Topical Primary Dressing Hydrofera Blue Classic Foam, 4x4 in Discharge Instruction: Moisten with saline prior to applying to wound bed Secondary Dressing Woven Gauze Sponge, Non-Sterile 4x4 in Discharge Instruction: Apply over primary dressing as directed. ABD Pad, 5x9 Discharge Instruction: Apply over primary dressing as directed. Secured With Compression Wrap Unnaboot w/Calamine, 4x10 (in/yd) Discharge Instruction: Apply Unnaboot as directed. Compression Stockings Add-Ons Electronic Signature(s) Signed: 02/20/2021 5:38:13 PM By: Baruch Gouty RN, BSN Signed: 02/20/2021 5:43:17 PM By: Rhae Hammock RN Entered By: Baruch Gouty on 02/20/2021 13:21:54 -------------------------------------------------------------------------------- Wound Assessment Details Patient Name: Date of Service: [name] 02/20/2021 12:30 PM Medical Record Number: [id number] Patient Account Number: [id number] Date of Birth/Sex: Treating RN: 1941-08-07 (80 y.o. Tonita Phoenix, Lauren Primary Care Provider: Nicholes Stairs Other Clinician: Referring  Provider: Treating Provider/Extender: Nyra Jabs in Treatment: 25 Wound Status Wound Number: 12 Primary Venous Leg Ulcer Etiology: Wound Location: Right, Lateral Lower Leg Secondary Diabetic Wound/Ulcer of the Lower Extremity Wounding Event: Gradually Appeared Etiology: Date Acquired: 02/06/2021 Wound Status: Healed - Epithelialized Weeks Of Treatment: 2 Comorbid Cataracts, Glaucoma, Hypertension, Type II Diabetes, Clustered Wound: No History: Osteoarthritis, Neuropathy Wound Measurements Length: (cm) Width: (cm) Depth: (cm) Area: (cm) Volume: (cm) 0 % Reduction in Area: 100% 0 % Reduction in Volume: 100% 0 Epithelialization: None 0 Tunneling: No 0 Undermining: No Wound Description Classification: Full Thickness Without Exposed Support Structures Wound Margin: Distinct, outline attached Exudate Amount: Small Exudate Type: Serosanguineous Exudate Color: red, brown Foul Odor After Cleansing: No Slough/Fibrino Yes Wound Bed Granulation Amount: Large (67-100%) Exposed Structure Granulation Quality:  Pink Fascia Exposed: No Necrotic Amount: Small (1-33%) Fat Layer (Subcutaneous Tissue) Exposed: Yes Tendon Exposed: No Muscle Exposed: No Joint Exposed: No Bone Exposed: No Treatment Notes Wound #12 (Lower Leg) Wound Laterality: Right, Lateral Cleanser Peri-Wound Care Topical Primary Dressing Secondary Dressing Secured With Compression Wrap Compression Stockings Add-Ons Electronic Signature(s) Signed: 02/20/2021 5:43:17 PM By: Rhae Hammock RN Entered By: Rhae Hammock on 02/20/2021 13:54:10 -------------------------------------------------------------------------------- Wound Assessment Details Patient Name: Date of Service: [name] 02/20/2021 12:30 PM Medical Record Number: [id number] Patient Account Number: [id number] Date of Birth/Sex: Treating RN: 20-Dec-1941 (80 y.o. Tonita Phoenix, Lauren Primary Care  Provider: Nicholes Stairs Other Clinician: Referring Provider: Treating Provider/Extender: Nyra Jabs in Treatment: 25 Wound Status Wound Number: 13 Primary Skin Tear Etiology: Wound Location: Right, Lateral Lower Leg Wound Open Wounding Event: Gradually Appeared Status: Date Acquired: 02/15/2021 Comorbid Cataracts, Glaucoma, Hypertension, Type II Diabetes, Weeks Of Treatment: 0 History: Osteoarthritis, Neuropathy Clustered Wound: No Wound Measurements Length: (cm) 4 Width: (cm) 0.5 Depth: (cm) 0.2 Area: (cm) 1.571 Volume: (cm) 0.314 % Reduction in Area: % Reduction in Volume: Epithelialization: None Tunneling: No Undermining: Yes Starting Position (o'clock): 12 Ending Position (o'clock): 12 Maximum Distance: (cm) 4 Wound Description Classification: Full Thickness Without Exposed Support Structures Wound Margin: Distinct, outline attached Exudate Amount: Medium Exudate Type: Serosanguineous Exudate Color: red, brown Foul Odor After Cleansing: No Slough/Fibrino Yes Wound Bed Granulation Amount: Large (67-100%) Exposed Structure Granulation Quality: Red, Pink Fascia Exposed: No Necrotic Amount: Small (1-33%) Fat Layer (Subcutaneous Tissue) Exposed: Yes Necrotic Quality: Eschar, Adherent Slough Tendon Exposed: No Muscle Exposed: No Joint Exposed: No Bone Exposed: No Treatment Notes Wound #13 (Lower Leg) Wound Laterality: Right, Lateral Cleanser Soap and Water Discharge Instruction: May shower and wash wound with dial antibacterial soap and water prior to dressing change. Wound Cleanser Discharge Instruction: Cleanse the wound with wound cleanser prior to applying a clean dressing using gauze sponges, not tissue or cotton balls. Normal Saline Discharge Instruction: Cleanse the wound with Normal Saline prior to applying a clean dressing using gauze sponges, not tissue or cotton balls. Peri-Wound Care Topical Primary  Dressing Hydrofera Blue Classic Foam, 4x4 in Discharge Instruction: Moisten with saline prior to applying to wound bed Secondary Dressing Woven Gauze Sponge, Non-Sterile 4x4 in Discharge Instruction: Apply over primary dressing as directed. ABD Pad, 5x9 Discharge Instruction: Apply over primary dressing as directed. Secured With Compression Wrap Unnaboot w/Calamine, 4x10 (in/yd) Discharge Instruction: Apply Unnaboot as directed. Compression Stockings Add-Ons Electronic Signature(s) Signed: 02/20/2021 5:43:17 PM By: Rhae Hammock RN Entered By: Rhae Hammock on 02/20/2021 13:59:24 -------------------------------------------------------------------------------- Wound Assessment Details Patient Name: Date of Service: [name] 02/20/2021 12:30 PM Medical Record Number: [id number] Patient Account Number: [id number] Date of Birth/Sex: Treating RN: 09-20-1941 (80 y.o. Tonita Phoenix, Lauren Primary Care Provider: Nicholes Stairs Other Clinician: Referring Provider: Treating Provider/Extender: Nyra Jabs in Treatment: 25 Wound Status Wound Number: 6 Primary Venous Leg Ulcer Etiology: Wound Location: Left, Medial Lower Leg Wound Open Wounding Event: Gradually Appeared Status: Date Acquired: 11/07/2020 Comorbid Cataracts, Glaucoma, Hypertension, Type II Diabetes, Weeks Of Treatment: 15 History: Osteoarthritis, Neuropathy Clustered Wound: No Wound Measurements Length: (cm) 0.2 Width: (cm) 0.2 Depth: (cm) 0.1 Area: (cm) 0.031 Volume: (cm) 0.003 % Reduction in Area: 96% % Reduction in Volume: 98.1% Epithelialization: Small (1-33%) Tunneling: No Undermining: No Wound Description Classification: Full Thickness Without Exposed Support Structures Wound Margin: Distinct, outline attached Exudate Amount: Small Exudate Type: Serosanguineous Exudate  Color: red, brown Foul Odor After Cleansing: No Slough/Fibrino No Wound  Bed Granulation Amount: Large (67-100%) Exposed Structure Granulation Quality: Pink Fascia Exposed: No Necrotic Amount: None Present (0%) Fat Layer (Subcutaneous Tissue) Exposed: Yes Tendon Exposed: No Muscle Exposed: No Joint Exposed: No Bone Exposed: No Treatment Notes Wound #6 (Lower Leg) Wound Laterality: Left, Medial Cleanser Soap and Water Discharge Instruction: May shower and wash wound with dial antibacterial soap and water prior to dressing change. Wound Cleanser Discharge Instruction: Cleanse the wound with wound cleanser prior to applying a clean dressing using gauze sponges, not tissue or cotton balls. Normal Saline Discharge Instruction: Cleanse the wound with Normal Saline prior to applying a clean dressing using gauze sponges, not tissue or cotton balls. Peri-Wound Care Topical Primary Dressing Hydrofera Blue Classic Foam, 4x4 in Discharge Instruction: Moisten with saline prior to applying to wound bed Secondary Dressing Woven Gauze Sponge, Non-Sterile 4x4 in Discharge Instruction: Apply over primary dressing as directed. ABD Pad, 5x9 Discharge Instruction: Apply over primary dressing as directed. Secured With Compression Wrap Unnaboot w/Calamine, 4x10 (in/yd) Discharge Instruction: Apply Unnaboot as directed. Compression Stockings Add-Ons Electronic Signature(s) Signed: 02/20/2021 5:38:13 PM By: Baruch Gouty RN, BSN Signed: 02/20/2021 5:43:17 PM By: Rhae Hammock RN Entered By: Baruch Gouty on 02/20/2021 13:26:54 -------------------------------------------------------------------------------- Wound Assessment Details Patient Name: Date of Service: [name] 02/20/2021 12:30 PM Medical Record Number: [id number] Patient Account Number: [id number] Date of Birth/Sex: Treating RN: 04-10-1941 (80 y.o. Tonita Phoenix, Lauren Primary Care Provider: Nicholes Stairs Other Clinician: Referring Provider: Treating Provider/Extender: Nyra Jabs in Treatment: 25 Wound Status Wound Number: 8 Primary Diabetic Wound/Ulcer of the Lower Extremity Etiology: Wound Location: Left, Distal Calcaneus Wound Open Wounding Event: Gradually Appeared Status: Date Acquired: 12/05/2020 Comorbid Cataracts, Glaucoma, Hypertension, Type II Diabetes, Weeks Of Treatment: 11 History: Osteoarthritis, Neuropathy Clustered Wound: No Wound Measurements Length: (cm) 0.2 Width: (cm) 1.2 Depth: (cm) 0.1 Area: (cm) 0.188 Volume: (cm) 0.019 % Reduction in Area: 70.1% % Reduction in Volume: 69.8% Epithelialization: Medium (34-66%) Tunneling: No Undermining: No Wound Description Classification: Grade 2 Wound Margin: Distinct, outline attached Exudate Amount: Small Exudate Type: Serous Exudate Color: amber Foul Odor After Cleansing: No Slough/Fibrino No Wound Bed Granulation Amount: Large (67-100%) Exposed Structure Granulation Quality: Pink Fascia Exposed: No Necrotic Amount: None Present (0%) Fat Layer (Subcutaneous Tissue) Exposed: Yes Tendon Exposed: No Muscle Exposed: No Joint Exposed: No Bone Exposed: No Treatment Notes Wound #8 (Calcaneus) Wound Laterality: Left, Distal Cleanser Soap and Water Discharge Instruction: May shower and wash wound with dial antibacterial soap and water prior to dressing change. Wound Cleanser Discharge Instruction: Cleanse the wound with wound cleanser prior to applying a clean dressing using gauze sponges, not tissue or cotton balls. Normal Saline Discharge Instruction: Cleanse the wound with Normal Saline prior to applying a clean dressing using gauze sponges, not tissue or cotton balls. Peri-Wound Care Topical Primary Dressing Hydrofera Blue Classic Foam, 4x4 in Discharge Instruction: Moisten with saline prior to applying to wound bed Secondary Dressing Woven Gauze Sponge, Non-Sterile 4x4 in Discharge Instruction: Apply over primary dressing as directed. ABD Pad,  5x9 Discharge Instruction: Apply over primary dressing as directed. Secured With Compression Wrap Unnaboot w/Calamine, 4x10 (in/yd) Discharge Instruction: Apply Unnaboot as directed. Compression Stockings Add-Ons Electronic Signature(s) Signed: 02/20/2021 5:38:13 PM By: Baruch Gouty RN, BSN Signed: 02/20/2021 5:43:17 PM By: Rhae Hammock RN Entered By: Baruch Gouty on 02/20/2021 13:27:57 -------------------------------------------------------------------------------- Vitals Details Patient Name: Date of Service: [name], PA TRICIA  S. 02/20/2021 12:30 PM Medical Record Number: [id number] Patient Account Number: [id number] Date of Birth/Sex: Treating RN: 1941/01/05 (80 y.o. Tonita Phoenix, Lauren Primary Care Provider: Nicholes Stairs Other Clinician: Referring Provider: Treating Provider/Extender: Nyra Jabs in Treatment: 25 Vital Signs Time Taken: 12:51 Temperature (F): 98.6 Height (in): 66 Pulse (bpm): 111 Weight (lbs): 141 Respiratory Rate (breaths/min): 18 Body Mass Index (BMI): 22.8 Blood Pressure (mmHg): 161/72 Reference Range: 80 - 120 mg / dl Electronic Signature(s) Signed: 02/21/2021 10:45:54 AM By: Sandre Kitty Signed: 02/21/2021 10:45:54 AM By: Sandre Kitty Entered By: Sandre Kitty on 02/20/2021 12:51:45

## 2021-02-23 DIAGNOSIS — M1710 Unilateral primary osteoarthritis, unspecified knee: Secondary | ICD-10-CM | POA: Diagnosis not present

## 2021-02-23 DIAGNOSIS — I129 Hypertensive chronic kidney disease with stage 1 through stage 4 chronic kidney disease, or unspecified chronic kidney disease: Secondary | ICD-10-CM | POA: Diagnosis not present

## 2021-02-23 DIAGNOSIS — E1143 Type 2 diabetes mellitus with diabetic autonomic (poly)neuropathy: Secondary | ICD-10-CM | POA: Diagnosis not present

## 2021-02-23 DIAGNOSIS — L97821 Non-pressure chronic ulcer of other part of left lower leg limited to breakdown of skin: Secondary | ICD-10-CM | POA: Diagnosis not present

## 2021-02-23 DIAGNOSIS — E1142 Type 2 diabetes mellitus with diabetic polyneuropathy: Secondary | ICD-10-CM | POA: Diagnosis not present

## 2021-02-23 DIAGNOSIS — I872 Venous insufficiency (chronic) (peripheral): Secondary | ICD-10-CM | POA: Diagnosis not present

## 2021-02-23 DIAGNOSIS — L97221 Non-pressure chronic ulcer of left calf limited to breakdown of skin: Secondary | ICD-10-CM | POA: Diagnosis not present

## 2021-02-23 DIAGNOSIS — E1122 Type 2 diabetes mellitus with diabetic chronic kidney disease: Secondary | ICD-10-CM | POA: Diagnosis not present

## 2021-02-23 DIAGNOSIS — N1831 Chronic kidney disease, stage 3a: Secondary | ICD-10-CM | POA: Diagnosis not present

## 2021-02-23 DIAGNOSIS — D631 Anemia in chronic kidney disease: Secondary | ICD-10-CM | POA: Diagnosis not present

## 2021-02-26 DIAGNOSIS — K219 Gastro-esophageal reflux disease without esophagitis: Secondary | ICD-10-CM | POA: Diagnosis not present

## 2021-02-26 DIAGNOSIS — H409 Unspecified glaucoma: Secondary | ICD-10-CM | POA: Diagnosis not present

## 2021-02-26 DIAGNOSIS — E782 Mixed hyperlipidemia: Secondary | ICD-10-CM | POA: Diagnosis not present

## 2021-02-26 DIAGNOSIS — M1990 Unspecified osteoarthritis, unspecified site: Secondary | ICD-10-CM | POA: Diagnosis not present

## 2021-02-26 DIAGNOSIS — M810 Age-related osteoporosis without current pathological fracture: Secondary | ICD-10-CM | POA: Diagnosis not present

## 2021-02-26 DIAGNOSIS — G8929 Other chronic pain: Secondary | ICD-10-CM | POA: Diagnosis not present

## 2021-02-26 DIAGNOSIS — E1122 Type 2 diabetes mellitus with diabetic chronic kidney disease: Secondary | ICD-10-CM | POA: Diagnosis not present

## 2021-02-26 DIAGNOSIS — M8580 Other specified disorders of bone density and structure, unspecified site: Secondary | ICD-10-CM | POA: Diagnosis not present

## 2021-02-26 DIAGNOSIS — E1142 Type 2 diabetes mellitus with diabetic polyneuropathy: Secondary | ICD-10-CM | POA: Diagnosis not present

## 2021-02-26 DIAGNOSIS — I10 Essential (primary) hypertension: Secondary | ICD-10-CM | POA: Diagnosis not present

## 2021-02-27 ENCOUNTER — Other Ambulatory Visit: Payer: Self-pay

## 2021-02-27 ENCOUNTER — Encounter (HOSPITAL_BASED_OUTPATIENT_CLINIC_OR_DEPARTMENT_OTHER): Payer: Medicare HMO | Attending: Internal Medicine | Admitting: Internal Medicine

## 2021-02-27 DIAGNOSIS — L97422 Non-pressure chronic ulcer of left heel and midfoot with fat layer exposed: Secondary | ICD-10-CM | POA: Insufficient documentation

## 2021-02-27 DIAGNOSIS — E11622 Type 2 diabetes mellitus with other skin ulcer: Secondary | ICD-10-CM | POA: Insufficient documentation

## 2021-02-27 DIAGNOSIS — E1122 Type 2 diabetes mellitus with diabetic chronic kidney disease: Secondary | ICD-10-CM | POA: Insufficient documentation

## 2021-02-27 DIAGNOSIS — E2740 Unspecified adrenocortical insufficiency: Secondary | ICD-10-CM | POA: Insufficient documentation

## 2021-02-27 DIAGNOSIS — E1140 Type 2 diabetes mellitus with diabetic neuropathy, unspecified: Secondary | ICD-10-CM | POA: Diagnosis not present

## 2021-02-27 DIAGNOSIS — N1832 Chronic kidney disease, stage 3b: Secondary | ICD-10-CM | POA: Diagnosis not present

## 2021-02-27 DIAGNOSIS — D869 Sarcoidosis, unspecified: Secondary | ICD-10-CM | POA: Diagnosis not present

## 2021-02-27 DIAGNOSIS — S81812A Laceration without foreign body, left lower leg, initial encounter: Secondary | ICD-10-CM | POA: Diagnosis not present

## 2021-02-27 DIAGNOSIS — S81811A Laceration without foreign body, right lower leg, initial encounter: Secondary | ICD-10-CM | POA: Diagnosis not present

## 2021-02-27 DIAGNOSIS — Z7952 Long term (current) use of systemic steroids: Secondary | ICD-10-CM | POA: Insufficient documentation

## 2021-02-27 DIAGNOSIS — L97822 Non-pressure chronic ulcer of other part of left lower leg with fat layer exposed: Secondary | ICD-10-CM | POA: Insufficient documentation

## 2021-02-27 DIAGNOSIS — I129 Hypertensive chronic kidney disease with stage 1 through stage 4 chronic kidney disease, or unspecified chronic kidney disease: Secondary | ICD-10-CM | POA: Insufficient documentation

## 2021-02-27 DIAGNOSIS — L97812 Non-pressure chronic ulcer of other part of right lower leg with fat layer exposed: Secondary | ICD-10-CM | POA: Insufficient documentation

## 2021-02-27 DIAGNOSIS — L97222 Non-pressure chronic ulcer of left calf with fat layer exposed: Secondary | ICD-10-CM | POA: Diagnosis not present

## 2021-02-27 DIAGNOSIS — E11621 Type 2 diabetes mellitus with foot ulcer: Secondary | ICD-10-CM | POA: Insufficient documentation

## 2021-02-27 DIAGNOSIS — I872 Venous insufficiency (chronic) (peripheral): Secondary | ICD-10-CM | POA: Diagnosis not present

## 2021-02-27 NOTE — Progress Notes (Signed)
YUKTHA, KERCHNER ([id number]) Visit Report for 02/27/2021 HPI Details Patient Name: Date of Service: [place], [name] 02/27/2021 1:15 PM Medical Record Number: [id number] Patient Account Number: [id number] Date of Birth/Sex: Treating RN: 23-Jun-1941 (80 y.o. Benjaman Lobe Primary Care Provider: Nicholes Stairs Other Clinician: Referring Provider: Treating Provider/Extender: Nyra Jabs in Treatment: 26 History of Present Illness HPI Description: We think they have been using silver alginateADMISSION 08/29/2020 This is a 80 year old woman who has wounds on her left lower leg mid aspect. She states that these have been present since June when she was hospitalized with a UTI, delirium possibly medication issues. I do not see any mention of the wounds on her left leg at that time. In any case these develop sometime after this. The patient is followed by Dr. Jacelyn Grip at [place] at [facility]. She has been using bacitracin ointment to the wounds. She has [facility] home health although I am not exactly sure what they are doing. Her husband is angry about a bill from home health again we were not able to help him exactly. The patient has chronic sarcoidosis and adrenal insufficiency she has been on longstanding prednisone. She has all the classic skin features of chronic steroid use. I think this is the primary issue for these wounds. They have also noted weeping fluid coming out of the wounds and even weeping draining fluid on the right leg although we were not able to define any wound on the right leg. She could very well have a component of chronic venous insufficiency as well. Past medical history includes sarcoidosis, type 2 diabetes with a recent hemoglobin A1c of 6.3, diabetic neuropathy, stage III P chronic renal failure, hypertension, adrenal insufficiency. ABI on the right noncompressible on the left at 1.02 9/14 2 small punched out areas of the left  anterior mid tibia. We have been using Iodoflex. Better looking wound surfaces 9/28; the 2 areas on the left anterior mid tibia are about the same. She has a new weeping area on the right side just medial to the tibia. I think all of these wounds are weeping edema fluid from a collection of fluid in the upper third of her lower leg. The skin distally is more fibrosed and there is no room for weeping edema fluid. I wanted to put her in 3 layer compression versus kerlix and Coban but she will not agree to it 10/12; 2-week follow-up. She has the 2 areas on the left anterior mid tibia. There is scissor injury now on the left medial calf. In a single area on the right medial lower extremity. All of these are roughly the same small punched out surfaces. Weeping edema fluid. She does not tolerate anything more than kerlix Coban and even with this she finds this too tight often making her husband cut these off. I had ordered Iodoflex to these wounds but according to her husband that is not what home health is using. 11/9; patient missed her last appointment so she has not been seen here in almost a month. She has the 2 punched-out areas on the left mid tibia 1 medial and 1 lateral a small excoriation a little more superior and lateral also on the left but she has also punched out areas on the right that she did not have last time I am not exactly sure how this happened. She complains about pain in her feet at night especially in the morning. Not really claudication but I was  I was concerned. Her edema is satisfactorily concerned and the kerlix and Coban 12/7; again a monthly follow-up. I am not exactly sure how we got into this monthly pattern. She does have home health coming out 3 times a week. She did manage to get the arterial studies done that I ordered last time. On the right her ABI was noncompressible although she had triphasic waveforms at the dorsalis pedis her great toe pressure was unobtainable.  On the left she had triphasic waveforms at the PTA and dorsalis pedis but again absent great toe waveforms. Segmental waveform analysis showed suggestion of posterior tibial artery occlusion on the right but a biphasic triphasic waveform with a normal normal examination on the left. The patient describes a lot of pain it is not clearly claudication but certainly there is a possibility here. She also has what I think is longstanding Raynaud's dating back to when she was a teenager She has new wounds on the left Achilles heel on the right Achilles heel from last time otherwise her wounds are about the same 1/4; the patient has an appointment with vascular on 01/09/2021. Basically I am asking if there is macrovascular issues that need to be addressed she still complains of a lot of pain in her feet and heels when she walks although I am not sure this is claudication. She also has longstanding Raynaud's by her description dating back to when she was a teenager. She has small punched-out areas on the left anterior lower x2, left anterior leg left heel and right heel. She tells me that the home health nurse stopped wrapping the right leg about 2 weeks ago there is a lot of edema in this leg threatening to break open. Small wound on the right lateral leg. 1/20; the patient's appointment with vascular had to be delayed. She has no open wounds on the right leg on the left she has an area on the left Achilles heel, left medial calf 2 areas anteriorly. All of these small punched out areas. 2/8; patient's vascular appointment is next week this is in consultation with regards to the lower extremity blood supply. She has 2 new wounds one on the right lateral and one on the left posterior. The 3 wounds on the left anterior are about the same. We have been using silver collagen with a kerlix and Coban wrap. 2/22; patient saw Dr. Stanford Breed on 2/15. He felt she had venous stasis ulcerations. Did not seem to think she had  any significant arterial issues. He put her in Unna boot compressions although home health took this off and put her back in kerlix Coban. She comes in with an area on the left lateral upper calf that looks like a skin tear. Almost circumferential undermining by 4 to 5 cm. The other major areas on the left lateral calf 3 small punched-out areas that interconnect she has a small punched-out area on the left anterior lower leg. I did not see anything on either heel today 3/1; patient with bilateral lower extremity ulcers in the setting of venous stasis, chronic steroid related skin changes. Electronic Signature(s) Signed: 02/27/2021 5:59:48 PM By: Linton Ham MD Entered By: Linton Ham on 02/27/2021 14:26:22 -------------------------------------------------------------------------------- Physical Exam Details Patient Name: Date of Service: [place], [name] 02/27/2021 1:15 PM Medical Record Number: [id number] Patient Account Number: [id number] Date of Birth/Sex: Treating RN: 05/05/1941 (80 y.o. Tonita Phoenix, Lauren Primary Care Provider: Nicholes Stairs Other Clinician: Referring Provider: Treating Provider/Extender: Mamie Laurel  Weeks in Treatment: 26 Cardiovascular Fetal pulses are palpable. Edema control is good. Notes Wound exam; right right anterior lower leg. On the left posterior she has 3 connecting areas small punched-out area on the left anterior. None of this appears to be sizably change from last time Electronic Signature(s) Signed: 02/27/2021 5:59:48 PM By: Linton Ham MD Entered By: Linton Ham on 02/27/2021 14:27:18 -------------------------------------------------------------------------------- Physician Orders Details Patient Name: Date of Service: [name] 02/27/2021 1:15 PM Medical Record Number: [id number] Patient Account Number: [id number] Date of Birth/Sex: Treating RN: January 22, 1941 (80 y.o. Tonita Phoenix, Lauren Primary Care  Provider: Nicholes Stairs Other Clinician: Referring Provider: Treating Provider/Extender: Nyra Jabs in Treatment: 47 Verbal / Phone Orders: No Diagnosis Coding Follow-up Appointments Return Appointment in 2 weeks. Bathing/ Shower/ Hygiene May shower with protection but do not get wound dressing(s) wet. Edema Control - Lymphedema / SCD / Other Elevate legs to the level of the heart or above for 30 minutes daily and/or when sitting, a frequency of: Avoid standing for long periods of time. Exercise regularly Additional Orders / Instructions Other: - ***Please pad the left dorsal foot**** Home Health New wound care orders this week; continue Home Health for wound care. May utilize formulary equivalent dressing for wound treatment orders unless otherwise specified. - Home health to change 2x a week when pt. not coming to [facility], and 1 x a week when pt. does come to [facility]. Wound Treatment Wound #1 - Lower Leg Wound Laterality: Left, Anterior Cleanser: Wound Cleanser 2 x Per Day/30 Days Discharge Instructions: Cleanse the wound with wound cleanser prior to applying a clean dressing using gauze sponges, not tissue or cotton balls. Cleanser: Soap and Water 2 x Per Day/30 Days Discharge Instructions: May shower and wash wound with dial antibacterial soap and water prior to dressing change. Cleanser: Normal Saline (Home Health) (Generic) 2 x Per Day/30 Days Discharge Instructions: Cleanse the wound with Normal Saline prior to applying a clean dressing using gauze sponges, not tissue or cotton balls. Prim Dressing: Hydrofera Blue Classic Foam, 4x4 in (Home Health) 2 x Per Day/30 Days ary Discharge Instructions: Moisten with saline prior to applying to wound bed Secondary Dressing: Woven Gauze Sponge, Non-Sterile 4x4 in (Home Health) (Generic) 2 x Per Day/30 Days Discharge Instructions: Apply over primary dressing as directed. Secondary Dressing: ABD Pad, 5x9 (Home Health)  2 x Per Day/30 Days Discharge Instructions: Apply over primary dressing as directed. Compression Wrap: Unnaboot w/Calamine, 4x10 (in/yd) (Home Health) 2 x Per Day/30 Days Discharge Instructions: Apply Unnaboot as directed. Wound #10 - Lower Leg Wound Laterality: Left, Anterior, Proximal Cleanser: Wound Cleanser 2 x Per Day/30 Days Discharge Instructions: Cleanse the wound with wound cleanser prior to applying a clean dressing using gauze sponges, not tissue or cotton balls. Cleanser: Soap and Water 2 x Per Day/30 Days Discharge Instructions: May shower and wash wound with dial antibacterial soap and water prior to dressing change. Cleanser: Normal Saline (Home Health) (Generic) 2 x Per Day/30 Days Discharge Instructions: Cleanse the wound with Normal Saline prior to applying a clean dressing using gauze sponges, not tissue or cotton balls. Prim Dressing: Hydrofera Blue Classic Foam, 4x4 in (Home Health) 2 x Per Day/30 Days ary Discharge Instructions: Moisten with saline prior to applying to wound bed Secondary Dressing: Woven Gauze Sponge, Non-Sterile 4x4 in (Home Health) (Generic) 2 x Per Day/30 Days Discharge Instructions: Apply over primary dressing as directed. Secondary Dressing: ABD Pad, 5x9 (Home Health) 2 x  Per Day/30 Days Discharge Instructions: Apply over primary dressing as directed. Compression Wrap: Unnaboot w/Calamine, 4x10 (in/yd) (Home Health) 2 x Per Day/30 Days Discharge Instructions: Apply Unnaboot as directed. Wound #11 - Lower Leg Wound Laterality: Left, Posterior Cleanser: Wound Cleanser 2 x Per Day/30 Days Discharge Instructions: Cleanse the wound with wound cleanser prior to applying a clean dressing using gauze sponges, not tissue or cotton balls. Cleanser: Soap and Water 2 x Per Day/30 Days Discharge Instructions: May shower and wash wound with dial antibacterial soap and water prior to dressing change. Cleanser: Normal Saline (Home Health) (Generic) 2 x Per Day/30  Days Discharge Instructions: Cleanse the wound with Normal Saline prior to applying a clean dressing using gauze sponges, not tissue or cotton balls. Prim Dressing: Hydrofera Blue Classic Foam, 4x4 in (Home Health) 2 x Per Day/30 Days ary Discharge Instructions: Moisten with saline prior to applying to wound bed Secondary Dressing: Woven Gauze Sponge, Non-Sterile 4x4 in (Home Health) (Generic) 2 x Per Day/30 Days Discharge Instructions: Apply over primary dressing as directed. Secondary Dressing: ABD Pad, 5x9 (Home Health) 2 x Per Day/30 Days Discharge Instructions: Apply over primary dressing as directed. Compression Wrap: Unnaboot w/Calamine, 4x10 (in/yd) (Home Health) 2 x Per Day/30 Days Discharge Instructions: Apply Unnaboot as directed. Wound #13 - Lower Leg Wound Laterality: Right, Lateral Cleanser: Wound Cleanser 2 x Per Day/30 Days Discharge Instructions: Cleanse the wound with wound cleanser prior to applying a clean dressing using gauze sponges, not tissue or cotton balls. Cleanser: Soap and Water 2 x Per Day/30 Days Discharge Instructions: May shower and wash wound with dial antibacterial soap and water prior to dressing change. Cleanser: Normal Saline (Home Health) (Generic) 2 x Per Day/30 Days Discharge Instructions: Cleanse the wound with Normal Saline prior to applying a clean dressing using gauze sponges, not tissue or cotton balls. Prim Dressing: Hydrofera Blue Classic Foam, 4x4 in (Home Health) 2 x Per Day/30 Days ary Discharge Instructions: Moisten with saline prior to applying to wound bed Secondary Dressing: Woven Gauze Sponge, Non-Sterile 4x4 in (Home Health) (Generic) 2 x Per Day/30 Days Discharge Instructions: Apply over primary dressing as directed. Secondary Dressing: ABD Pad, 5x9 (Home Health) 2 x Per Day/30 Days Discharge Instructions: Apply over primary dressing as directed. Compression Wrap: Unnaboot w/Calamine, 4x10 (in/yd) (Home Health) 2 x Per Day/30  Days Discharge Instructions: Apply Unnaboot as directed. Wound #6 - Lower Leg Wound Laterality: Left, Medial Cleanser: Wound Cleanser 2 x Per Day/30 Days Discharge Instructions: Cleanse the wound with wound cleanser prior to applying a clean dressing using gauze sponges, not tissue or cotton balls. Cleanser: Soap and Water 2 x Per Day/30 Days Discharge Instructions: May shower and wash wound with dial antibacterial soap and water prior to dressing change. Cleanser: Normal Saline (Home Health) (Generic) 2 x Per Day/30 Days Discharge Instructions: Cleanse the wound with Normal Saline prior to applying a clean dressing using gauze sponges, not tissue or cotton balls. Prim Dressing: Hydrofera Blue Classic Foam, 4x4 in (Home Health) 2 x Per Day/30 Days ary Discharge Instructions: Moisten with saline prior to applying to wound bed Secondary Dressing: Woven Gauze Sponge, Non-Sterile 4x4 in (Home Health) (Generic) 2 x Per Day/30 Days Discharge Instructions: Apply over primary dressing as directed. Secondary Dressing: ABD Pad, 5x9 (Home Health) 2 x Per Day/30 Days Discharge Instructions: Apply over primary dressing as directed. Compression Wrap: Unnaboot w/Calamine, 4x10 (in/yd) (Home Health) 2 x Per Day/30 Days Discharge Instructions: Apply Unnaboot as directed. Wound #8 - Calcaneus  Wound Laterality: Left, Distal Cleanser: Wound Cleanser 2 x Per Day/30 Days Discharge Instructions: Cleanse the wound with wound cleanser prior to applying a clean dressing using gauze sponges, not tissue or cotton balls. Cleanser: Soap and Water 2 x Per Day/30 Days Discharge Instructions: May shower and wash wound with dial antibacterial soap and water prior to dressing change. Cleanser: Normal Saline (Home Health) (Generic) 2 x Per Day/30 Days Discharge Instructions: Cleanse the wound with Normal Saline prior to applying a clean dressing using gauze sponges, not tissue or cotton balls. Prim Dressing: Hydrofera Blue  Classic Foam, 4x4 in (Home Health) 2 x Per Day/30 Days ary Discharge Instructions: Moisten with saline prior to applying to wound bed Secondary Dressing: Woven Gauze Sponge, Non-Sterile 4x4 in (Home Health) (Generic) 2 x Per Day/30 Days Discharge Instructions: Apply over primary dressing as directed. Secondary Dressing: ABD Pad, 5x9 (Home Health) 2 x Per Day/30 Days Discharge Instructions: Apply over primary dressing as directed. Compression Wrap: Unnaboot w/Calamine, 4x10 (in/yd) (Home Health) 2 x Per Day/30 Days Discharge Instructions: Apply Unnaboot as directed. Electronic Signature(s) Signed: 02/27/2021 5:58:27 PM By: Rhae Hammock RN Signed: 02/27/2021 5:59:48 PM By: Linton Ham MD Entered By: Rhae Hammock on 02/27/2021 14:05:59 -------------------------------------------------------------------------------- Problem List Details Patient Name: Date of Service: [name] 02/27/2021 1:15 PM Medical Record Number: [id number] Patient Account Number: [id number] Date of Birth/Sex: Treating RN: 02-Aug-1941 (80 y.o. Tonita Phoenix, Lauren Primary Care Provider: Nicholes Stairs Other Clinician: Referring Provider: Treating Provider/Extender: Nyra Jabs in Treatment: 26 Active Problems ICD-10 Encounter Code Description Active Date MDM Diagnosis [phone] Non-pressure chronic ulcer of other part of left lower leg limited to breakdown 08/29/2020 No Yes of skin L97.819 Non-pressure chronic ulcer of other part of right lower leg with unspecified 09/26/2020 No Yes severity I87.2 Venous insufficiency (chronic) (peripheral) 08/29/2020 No Yes Z92.241 Personal history of systemic steroid therapy 08/29/2020 No Yes E11.621 Type 2 diabetes mellitus with foot ulcer 12/05/2020 No Yes Inactive Problems ICD-10 Code Description Active Date Inactive Date L97.428 Non-pressure chronic ulcer of left heel and midfoot with other specified severity 12/05/2020  12/05/2020 L97.418 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 12/05/2020 12/05/2020 Resolved Problems Electronic Signature(s) Signed: 02/27/2021 5:59:48 PM By: Linton Ham MD Entered By: Linton Ham on 02/27/2021 14:23:15 -------------------------------------------------------------------------------- Progress Note Details Patient Name: Date of Service: [name] 02/27/2021 1:15 PM Medical Record Number: [id number] Patient Account Number: [id number] Date of Birth/Sex: Treating RN: 1941-09-18 (80 y.o. Tonita Phoenix, Lauren Primary Care Provider: Nicholes Stairs Other Clinician: Referring Provider: Treating Provider/Extender: Nyra Jabs in Treatment: 26 Subjective History of Present Illness (HPI) We think they have been using silver alginateADMISSION 08/29/2020 This is a 80 year old woman who has wounds on her left lower leg mid aspect. She states that these have been present since June when she was hospitalized with a UTI, delirium possibly medication issues. I do not see any mention of the wounds on her left leg at that time. In any case these develop sometime after this. The patient is followed by Dr. Jacelyn Grip at [place] at [facility]. She has been using bacitracin ointment to the wounds. She has [facility] home health although I am not exactly sure what they are doing. Her husband is angry about a bill from home health again we were not able to help him exactly. The patient has chronic sarcoidosis and adrenal insufficiency she has been on longstanding prednisone. She has  all the classic skin features of chronic steroid use. I think this is the primary issue for these wounds. They have also noted weeping fluid coming out of the wounds and even weeping draining fluid on the right leg although we were not able to define any wound on the right leg. She could very well have a component of chronic venous insufficiency  as well. Past medical history includes sarcoidosis, type 2 diabetes with a recent hemoglobin A1c of 6.3, diabetic neuropathy, stage III P chronic renal failure, hypertension, adrenal insufficiency. ABI on the right noncompressible on the left at 1.02 9/14 2 small punched out areas of the left anterior mid tibia. We have been using Iodoflex. Better looking wound surfaces 9/28; the 2 areas on the left anterior mid tibia are about the same. She has a new weeping area on the right side just medial to the tibia. I think all of these wounds are weeping edema fluid from a collection of fluid in the upper third of her lower leg. The skin distally is more fibrosed and there is no room for weeping edema fluid. I wanted to put her in 3 layer compression versus kerlix and Coban but she will not agree to it 10/12; 2-week follow-up. She has the 2 areas on the left anterior mid tibia. There is scissor injury now on the left medial calf. In a single area on the right medial lower extremity. All of these are roughly the same small punched out surfaces. Weeping edema fluid. She does not tolerate anything more than kerlix Coban and even with this she finds this too tight often making her husband cut these off. I had ordered Iodoflex to these wounds but according to her husband that is not what home health is using. 11/9; patient missed her last appointment so she has not been seen here in almost a month. She has the 2 punched-out areas on the left mid tibia 1 medial and 1 lateral a small excoriation a little more superior and lateral also on the left but she has also punched out areas on the right that she did not have last time I am not exactly sure how this happened. She complains about pain in her feet at night especially in the morning. Not really claudication but I was I was concerned. Her edema is satisfactorily concerned and the kerlix and Coban 12/7; again a monthly follow-up. I am not exactly sure how we got  into this monthly pattern. She does have home health coming out 3 times a week. She did manage to get the arterial studies done that I ordered last time. On the right her ABI was noncompressible although she had triphasic waveforms at the dorsalis pedis her great toe pressure was unobtainable. On the left she had triphasic waveforms at the PTA and dorsalis pedis but again absent great toe waveforms. Segmental waveform analysis showed suggestion of posterior tibial artery occlusion on the right but a biphasic triphasic waveform with a normal normal examination on the left. The patient describes a lot of pain it is not clearly claudication but certainly there is a possibility here. She also has what I think is longstanding Raynaud's dating back to when she was a teenager She has new wounds on the left Achilles heel on the right Achilles heel from last time otherwise her wounds are about the same 1/4; the patient has an appointment with vascular on 01/09/2021. Basically I am asking if there is macrovascular issues that need to be addressed  she still complains of a lot of pain in her feet and heels when she walks although I am not sure this is claudication. She also has longstanding Raynaud's by her description dating back to when she was a teenager. She has small punched-out areas on the left anterior lower x2, left anterior leg left heel and right heel. She tells me that the home health nurse stopped wrapping the right leg about 2 weeks ago there is a lot of edema in this leg threatening to break open. Small wound on the right lateral leg. 1/20; the patient's appointment with vascular had to be delayed. She has no open wounds on the right leg on the left she has an area on the left Achilles heel, left medial calf 2 areas anteriorly. All of these small punched out areas. 2/8; patient's vascular appointment is next week this is in consultation with regards to the lower extremity blood supply. She has 2 new  wounds one on the right lateral and one on the left posterior. The 3 wounds on the left anterior are about the same. We have been using silver collagen with a kerlix and Coban wrap. 2/22; patient saw Dr. Stanford Breed on 2/15. He felt she had venous stasis ulcerations. Did not seem to think she had any significant arterial issues. He put her in Unna boot compressions although home health took this off and put her back in kerlix Coban. She comes in with an area on the left lateral upper calf that looks like a skin tear. Almost circumferential undermining by 4 to 5 cm. The other major areas on the left lateral calf 3 small punched-out areas that interconnect she has a small punched-out area on the left anterior lower leg. I did not see anything on either heel today 3/1; patient with bilateral lower extremity ulcers in the setting of venous stasis, chronic steroid related skin changes. Objective Constitutional Vitals Time Taken: 1:39 PM, Height: 66 in, Weight: 141 lbs, BMI: 22.8, Temperature: 98.3 F, Pulse: 99 bpm, Respiratory Rate: 18 breaths/min, Blood Pressure: 154/75 mmHg. Cardiovascular Fetal pulses are palpable. Edema control is good. General Notes: Wound exam; right right anterior lower leg. On the left posterior she has 3 connecting areas small punched-out area on the left anterior. None of this appears to be sizably change from last time Integumentary (Hair, Skin) Wound #1 status is Open. Original cause of wound was Gradually Appeared. The date acquired was: 06/15/2020. The wound has been in treatment 26 weeks. The wound is located on the Left,Anterior Lower Leg. The wound measures 0.6cm length x 0.6cm width x 0.2cm depth; 0.283cm^2 area and 0.057cm^3 volume. There is Fat Layer (Subcutaneous Tissue) exposed. There is no tunneling or undermining noted. There is a small amount of serosanguineous drainage noted. The wound margin is flat and intact. There is large (67-100%) red granulation within the  wound bed. There is no necrotic tissue within the wound bed. Wound #10 status is Open. Original cause of wound was Blister. The date acquired was: 01/17/2021. The wound has been in treatment 5 weeks. The wound is located on the Left,Proximal,Anterior Lower Leg. The wound measures 0.5cm length x 0.4cm width x 0.1cm depth; 0.157cm^2 area and 0.016cm^3 volume. There is Fat Layer (Subcutaneous Tissue) exposed. There is no tunneling or undermining noted. There is a small amount of serosanguineous drainage noted. The wound margin is distinct with the outline attached to the wound base. There is large (67-100%) pink granulation within the wound bed. There is no necrotic  tissue within the wound bed. Wound #11 status is Open. Original cause of wound was Gradually Appeared. The date acquired was: 02/03/2021. The wound has been in treatment 3 weeks. The wound is located on the Left,Posterior Lower Leg. The wound measures 4.5cm length x 1cm width x 0.2cm depth; 3.534cm^2 area and 0.707cm^3 volume. There is Fat Layer (Subcutaneous Tissue) exposed. There is no tunneling or undermining noted. There is a medium amount of serosanguineous drainage noted. The wound margin is well defined and not attached to the wound base. There is large (67-100%) red granulation within the wound bed. There is a small (1-33%) amount of necrotic tissue within the wound bed including Adherent Slough. Wound #13 status is Open. Original cause of wound was Gradually Appeared. The date acquired was: 02/15/2021. The wound has been in treatment 1 weeks. The wound is located on the Right,Lateral Lower Leg. The wound measures 3.7cm length x 0.6cm width x 0.1cm depth; 1.744cm^2 area and 0.174cm^3 volume. There is Fat Layer (Subcutaneous Tissue) exposed. There is no tunneling or undermining noted. There is a medium amount of serosanguineous drainage noted. The wound margin is distinct with the outline attached to the wound base. There is large (67-100%)  red, pink granulation within the wound bed. There is a small (1-33%) amount of necrotic tissue within the wound bed including Eschar and Adherent Slough. Wound #6 status is Open. Original cause of wound was Gradually Appeared. The date acquired was: 11/07/2020. The wound has been in treatment 16 weeks. The wound is located on the Left,Medial Lower Leg. The wound measures 0.1cm length x 0.1cm width x 0.1cm depth; 0.008cm^2 area and 0.001cm^3 volume. There is no tunneling or undermining noted. There is a none present amount of drainage noted. The wound margin is distinct with the outline attached to the wound base. There is no granulation within the wound bed. There is no necrotic tissue within the wound bed. Wound #8 status is Open. Original cause of wound was Gradually Appeared. The date acquired was: 12/05/2020. The wound has been in treatment 12 weeks. The wound is located on the Left,Distal Calcaneus. The wound measures 0.2cm length x 1.1cm width x 0.1cm depth; 0.173cm^2 area and 0.017cm^3 volume. There is Fat Layer (Subcutaneous Tissue) exposed. There is no tunneling or undermining noted. There is a small amount of serous drainage noted. The wound margin is distinct with the outline attached to the wound base. There is large (67-100%) pink granulation within the wound bed. There is no necrotic tissue within the wound bed. 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 There was a Haematologist Compression Therapy Procedure by Rhae Hammock, RN. Post procedure Diagnosis Wound #1: Same as  Pre-Procedure Wound #10 Pre-procedure diagnosis of Wound #10 is a Diabetic Wound/Ulcer of the Lower Extremity located on the Left,Proximal,Anterior Lower Leg . There was a Haematologist Compression Therapy Procedure by Rhae Hammock, RN. Post procedure Diagnosis Wound #10: Same as Pre-Procedure Wound #11 Pre-procedure diagnosis of Wound #11 is a Venous Leg Ulcer located on the Left,Posterior Lower Leg . There was a Haematologist Compression Therapy Procedure by Rhae Hammock, RN. Post procedure Diagnosis Wound #  11: Same as Pre-Procedure Wound #13 Pre-procedure diagnosis of Wound #13 is a Skin T located on the Right,Lateral Lower Leg . There was a Haematologist Compression Therapy Procedure by ear Rhae Hammock, RN. Post procedure Diagnosis Wound #13: Same as Pre-Procedure Wound #6 Pre-procedure diagnosis of Wound #6 is a Venous Leg Ulcer located on the Left,Medial Lower Leg . There was a Haematologist Compression Therapy Procedure by Rhae Hammock, RN. Post procedure Diagnosis Wound #6: Same as Pre-Procedure Wound #8 Pre-procedure diagnosis of Wound #8 is a Diabetic Wound/Ulcer of the Lower Extremity located on the Left,Distal Calcaneus . There was a Haematologist Compression Therapy Procedure by Rhae Hammock, RN. Post procedure Diagnosis Wound #8: Same as Pre-Procedure Plan Follow-up Appointments: Return Appointment in 2 weeks. Bathing/ Shower/ Hygiene: May shower with protection but do not get wound dressing(s) wet. Edema Control - Lymphedema / SCD / Other: Elevate legs to the level of the heart or above for 30 minutes daily and/or when sitting, a frequency of: Avoid standing for long periods of time. Exercise regularly Additional Orders / Instructions: Other: - ***Please pad the left dorsal foot**** Home Health: New wound care orders this week; continue Home Health for wound care. May utilize formulary equivalent dressing for wound treatment orders unless otherwise specified. -  Home health to change 2x a week when pt. not coming to [facility], and 1 x a week when pt. does come to [facility]. WOUND #1: - Lower Leg Wound Laterality: Left, Anterior Cleanser: Wound Cleanser 2 x Per Day/30 Days Discharge Instructions: Cleanse the wound with wound cleanser prior to applying a clean dressing using gauze sponges, not tissue or cotton balls. Cleanser: Soap and Water 2 x Per Day/30 Days Discharge Instructions: May shower and wash wound with dial antibacterial soap and water prior to dressing change. Cleanser: Normal Saline (Home Health) (Generic) 2 x Per Day/30 Days Discharge Instructions: Cleanse the wound with Normal Saline prior to applying a clean dressing using gauze sponges, not tissue or cotton balls. Prim Dressing: Hydrofera Blue Classic Foam, 4x4 in (Home Health) 2 x Per Day/30 Days ary Discharge Instructions: Moisten with saline prior to applying to wound bed Secondary Dressing: Woven Gauze Sponge, Non-Sterile 4x4 in (Home Health) (Generic) 2 x Per Day/30 Days Discharge Instructions: Apply over primary dressing as directed. Secondary Dressing: ABD Pad, 5x9 (Home Health) 2 x Per Day/30 Days Discharge Instructions: Apply over primary dressing as directed. Com pression Wrap: Unnaboot w/Calamine, 4x10 (in/yd) (Home Health) 2 x Per Day/30 Days Discharge Instructions: Apply Unnaboot as directed. WOUND #10: - Lower Leg Wound Laterality: Left, Anterior, Proximal Cleanser: Wound Cleanser 2 x Per Day/30 Days Discharge Instructions: Cleanse the wound with wound cleanser prior to applying a clean dressing using gauze sponges, not tissue or cotton balls. Cleanser: Soap and Water 2 x Per Day/30 Days Discharge Instructions: May shower and wash wound with dial antibacterial soap and water prior to dressing change. Cleanser: Normal Saline (Home Health) (Generic) 2 x Per Day/30 Days Discharge Instructions: Cleanse the wound with Normal Saline prior to applying a clean dressing using gauze sponges,  not tissue or cotton balls. Prim Dressing: Hydrofera Blue Classic Foam, 4x4 in (Home Health) 2 x Per Day/30 Days ary Discharge Instructions: Moisten with saline prior to applying to wound bed Secondary Dressing: Woven Gauze Sponge, Non-Sterile 4x4 in (Home Health) (Generic) 2 x Per Day/30 Days Discharge Instructions: Apply over primary dressing as directed. Secondary Dressing: ABD Pad, 5x9 (Home Health) 2 x Per Day/30 Days Discharge Instructions:  Apply over primary dressing as directed. Com pression Wrap: Unnaboot w/Calamine, 4x10 (in/yd) (Home Health) 2 x Per Day/30 Days Discharge Instructions: Apply Unnaboot as directed. WOUND #11: - Lower Leg Wound Laterality: Left, Posterior Cleanser: Wound Cleanser 2 x Per Day/30 Days Discharge Instructions: Cleanse the wound with wound cleanser prior to applying a clean dressing using gauze sponges, not tissue or cotton balls. Cleanser: Soap and Water 2 x Per Day/30 Days Discharge Instructions: May shower and wash wound with dial antibacterial soap and water prior to dressing change. Cleanser: Normal Saline (Home Health) (Generic) 2 x Per Day/30 Days Discharge Instructions: Cleanse the wound with Normal Saline prior to applying a clean dressing using gauze sponges, not tissue or cotton balls. Prim Dressing: Hydrofera Blue Classic Foam, 4x4 in (Home Health) 2 x Per Day/30 Days ary Discharge Instructions: Moisten with saline prior to applying to wound bed Secondary Dressing: Woven Gauze Sponge, Non-Sterile 4x4 in (Home Health) (Generic) 2 x Per Day/30 Days Discharge Instructions: Apply over primary dressing as directed. Secondary Dressing: ABD Pad, 5x9 (Home Health) 2 x Per Day/30 Days Discharge Instructions: Apply over primary dressing as directed. Com pression Wrap: Unnaboot w/Calamine, 4x10 (in/yd) (Home Health) 2 x Per Day/30 Days Discharge Instructions: Apply Unnaboot as directed. WOUND #13: - Lower Leg Wound Laterality: Right, Lateral Cleanser:  Wound Cleanser 2 x Per Day/30 Days Discharge Instructions: Cleanse the wound with wound cleanser prior to applying a clean dressing using gauze sponges, not tissue or cotton balls. Cleanser: Soap and Water 2 x Per Day/30 Days Discharge Instructions: May shower and wash wound with dial antibacterial soap and water prior to dressing change. Cleanser: Normal Saline (Home Health) (Generic) 2 x Per Day/30 Days Discharge Instructions: Cleanse the wound with Normal Saline prior to applying a clean dressing using gauze sponges, not tissue or cotton balls. Prim Dressing: Hydrofera Blue Classic Foam, 4x4 in (Home Health) 2 x Per Day/30 Days ary Discharge Instructions: Moisten with saline prior to applying to wound bed Secondary Dressing: Woven Gauze Sponge, Non-Sterile 4x4 in (Home Health) (Generic) 2 x Per Day/30 Days Discharge Instructions: Apply over primary dressing as directed. Secondary Dressing: ABD Pad, 5x9 (Home Health) 2 x Per Day/30 Days Discharge Instructions: Apply over primary dressing as directed. Com pression Wrap: Unnaboot w/Calamine, 4x10 (in/yd) (Home Health) 2 x Per Day/30 Days Discharge Instructions: Apply Unnaboot as directed. WOUND #6: - Lower Leg Wound Laterality: Left, Medial Cleanser: Wound Cleanser 2 x Per Day/30 Days Discharge Instructions: Cleanse the wound with wound cleanser prior to applying a clean dressing using gauze sponges, not tissue or cotton balls. Cleanser: Soap and Water 2 x Per Day/30 Days Discharge Instructions: May shower and wash wound with dial antibacterial soap and water prior to dressing change. Cleanser: Normal Saline (Home Health) (Generic) 2 x Per Day/30 Days Discharge Instructions: Cleanse the wound with Normal Saline prior to applying a clean dressing using gauze sponges, not tissue or cotton balls. Prim Dressing: Hydrofera Blue Classic Foam, 4x4 in (Home Health) 2 x Per Day/30 Days ary Discharge Instructions: Moisten with saline prior to applying  to wound bed Secondary Dressing: Woven Gauze Sponge, Non-Sterile 4x4 in (Home Health) (Generic) 2 x Per Day/30 Days Discharge Instructions: Apply over primary dressing as directed. Secondary Dressing: ABD Pad, 5x9 (Home Health) 2 x Per Day/30 Days Discharge Instructions: Apply over primary dressing as directed. Com pression Wrap: Unnaboot w/Calamine, 4x10 (in/yd) (Home Health) 2 x Per Day/30 Days Discharge Instructions: Apply Unnaboot as directed. WOUND #8: - Calcaneus Wound  Laterality: Left, Distal Cleanser: Wound Cleanser 2 x Per Day/30 Days Discharge Instructions: Cleanse the wound with wound cleanser prior to applying a clean dressing using gauze sponges, not tissue or cotton balls. Cleanser: Soap and Water 2 x Per Day/30 Days Discharge Instructions: May shower and wash wound with dial antibacterial soap and water prior to dressing change. Cleanser: Normal Saline (Home Health) (Generic) 2 x Per Day/30 Days Discharge Instructions: Cleanse the wound with Normal Saline prior to applying a clean dressing using gauze sponges, not tissue or cotton balls. Prim Dressing: Hydrofera Blue Classic Foam, 4x4 in (Home Health) 2 x Per Day/30 Days ary Discharge Instructions: Moisten with saline prior to applying to wound bed Secondary Dressing: Woven Gauze Sponge, Non-Sterile 4x4 in (Home Health) (Generic) 2 x Per Day/30 Days Discharge Instructions: Apply over primary dressing as directed. Secondary Dressing: ABD Pad, 5x9 (Home Health) 2 x Per Day/30 Days Discharge Instructions: Apply over primary dressing as directed. Com pression Wrap: Unnaboot w/Calamine, 4x10 (in/yd) (Home Health) 2 x Per Day/30 Days Discharge Instructions: Apply Unnaboot as directed. 1. Continue with the Hydrofera Blue under Unna boots. 2. Very fragile skin. The area on the right anterior lower leg may require debridement I been hoping to avoid this Electronic Signature(s) Signed: 02/27/2021 5:59:48 PM By: Linton Ham  MD Signed: 02/27/2021 5:59:48 PM By: Linton Ham MD Entered By: Linton Ham on 02/27/2021 14:28:10 -------------------------------------------------------------------------------- SuperBill Details Patient Name: Date of Service: [name] 02/27/2021 Medical Record Number: [id number] Patient Account Number: [id number] Date of Birth/Sex: Treating RN: 1941/01/07 (80 y.o. Tonita Phoenix, Lauren Primary Care Provider: Nicholes Stairs Other Clinician: Referring Provider: Treating Provider/Extender: Nyra Jabs in Treatment: 26 Diagnosis Coding ICD-10 Codes Code Description [id number] Non-pressure chronic ulcer of other part of left lower leg limited to breakdown of skin L97.819 Non-pressure chronic ulcer of other part of right lower leg with unspecified severity I87.2 Venous insufficiency (chronic) (peripheral) Z92.241 Personal history of systemic steroid therapy E11.621 Type 2 diabetes mellitus with foot ulcer Facility Procedures CPT4 Code: [id number] Description: 29580 - APPLY UNNA BOOT/PROFO BILATERAL Modifier: Quantity: 1 Physician Procedures : CPT4 Code Description Modifier [id number] - WC PHYS LEVEL 3 - EST PT ICD-10 Diagnosis Description L97.821 Non-pressure chronic ulcer of other part of left lower leg limited to breakdown of skin L97.819 Non-pressure chronic ulcer of other part of  right lower leg with unspecified severity I87.2 Venous insufficiency (chronic) (peripheral) Quantity: 1 Electronic Signature(s) Signed: 02/27/2021 5:59:48 PM By: Linton Ham MD Entered By: Linton Ham on 02/27/2021 14:28:33

## 2021-02-28 NOTE — Progress Notes (Signed)
KEIAIRA, DONLAN ([id number]) Visit Report for 02/27/2021 Arrival Information Details Patient Name: Date of Service: [place], [name] 02/27/2021 1:15 PM Medical Record Number: [id number] Patient Account Number: [id number] Date of Birth/Sex: Treating RN: [birth year] (80 y.o. Tonita Phoenix, Lauren Primary Care Provider: Nicholes Stairs Other Clinician: Referring Provider: Treating Provider/Extender: Nyra Jabs in Treatment: 68 Visit Information History Since Last Visit Added or deleted any medications: No Patient Arrived: Wheel Chair Any new allergies or adverse reactions: No Arrival Time: 13:38 Had a fall or experienced change in No Accompanied By: husband activities of daily living that may affect Transfer Assistance: None risk of falls: Patient Identification Verified: Yes Signs or symptoms of abuse/neglect since last visito No Secondary Verification Process Completed: Yes Hospitalized since last visit: No Patient Requires Transmission-Based Precautions: No Implantable device outside of the clinic excluding No Patient Has Alerts: No cellular tissue based products placed in the center since last visit: Has Dressing in Place as Prescribed: Yes Pain Present Now: Yes Electronic Signature(s) Signed: 02/27/2021 5:53:33 PM By: Sandre Kitty Entered By: Sandre Kitty on 02/27/2021 13:39:28 -------------------------------------------------------------------------------- Compression Therapy Details Patient Name: Date of Service: [name] 02/27/2021 1:15 PM Medical Record Number: [id number] Patient Account Number: [id number] Date of Birth/Sex: Treating RN: 25-May-1941 (80 y.o. Tonita Phoenix, Lauren Primary Care Provider: Nicholes Stairs Other Clinician: Referring Provider: Treating Provider/Extender: Nyra Jabs in Treatment: 26 Compression Therapy Performed for Wound Assessment: Wound #1 Left,Anterior Lower  Leg Performed By: Clinician Rhae Hammock, RN Compression Type: Rolena Infante Post Procedure Diagnosis Same as Pre-procedure Electronic Signature(s) Signed: 02/27/2021 5:58:27 PM By: Rhae Hammock RN Entered By: Rhae Hammock on 02/27/2021 14:09:03 -------------------------------------------------------------------------------- Compression Therapy Details Patient Name: Date of Service: [place], [name] 02/27/2021 1:15 PM Medical Record Number: [id number] Patient Account Number: [id number] Date of Birth/Sex: Treating RN: [birth year] (80 y.o. Tonita Phoenix, Lauren Primary Care Provider: Nicholes Stairs Other Clinician: Referring Provider: Treating Provider/Extender: Nyra Jabs in Treatment: 26 Compression Therapy Performed for Wound Assessment: Wound #10 Left,Proximal,Anterior Lower Leg Performed By: Clinician Rhae Hammock, RN Compression Type: Rolena Infante Post Procedure Diagnosis Same as Pre-procedure Electronic Signature(s) Signed: 02/27/2021 5:58:27 PM By: Rhae Hammock RN Entered By: Rhae Hammock on 02/27/2021 14:09:04 -------------------------------------------------------------------------------- Compression Therapy Details Patient Name: Date of Service: [place], [name] 02/27/2021 1:15 PM Medical Record Number: [id number] Patient Account Number: [id number] Date of Birth/Sex: Treating RN: [birth year] (80 y.o. Tonita Phoenix, Lauren Primary Care Provider: Nicholes Stairs Other Clinician: Referring Provider: Treating Provider/Extender: Nyra Jabs in Treatment: 26 Compression Therapy Performed for Wound Assessment: Wound #11 Left,Posterior Lower Leg Performed By: Clinician Rhae Hammock, RN Compression Type: Rolena Infante Post Procedure Diagnosis Same as Pre-procedure Electronic Signature(s) Signed: 02/27/2021 5:58:27 PM By: Rhae Hammock RN Entered By: Rhae Hammock on 02/27/2021  14:09:04 -------------------------------------------------------------------------------- Compression Therapy Details Patient Name: Date of Service: [place], [name] 02/27/2021 1:15 PM Medical Record Number: [id number] Patient Account Number: [id number] Date of Birth/Sex: Treating RN: Jun 05, 1941 (80 y.o. Tonita Phoenix, Lauren Primary Care Provider: Nicholes Stairs Other Clinician: Referring Provider: Treating Provider/Extender: Nyra Jabs in Treatment: 26 Compression Therapy Performed for Wound Assessment: Wound #13 Right,Lateral Lower Leg Performed By: Clinician Rhae Hammock, RN Compression Type: Rolena Infante Post Procedure Diagnosis Same as Pre-procedure Electronic Signature(s) Signed: 02/27/2021 5:58:27 PM By: Rhae Hammock RN Signed: 02/27/2021 5:58:27 PM By: Rhae Hammock RN Entered By: Rhae Hammock on 02/27/2021  14:09:04 -------------------------------------------------------------------------------- Compression Therapy Details Patient Name: Date of Service: BRAELEY, BUSKEY 02/27/2021 1:15 PM Medical Record Number: [id number] Patient Account Number: [id number] Date of Birth/Sex: Treating RN: 1941/08/13 (80 y.o. Tonita Phoenix, Lauren Primary Care Provider: Nicholes Stairs Other Clinician: Referring Provider: Treating Provider/Extender: Nyra Jabs in Treatment: 26 Compression Therapy Performed for Wound Assessment: Wound #6 Left,Medial Lower Leg Performed By: Clinician Rhae Hammock, RN Compression Type: Rolena Infante Post Procedure Diagnosis Same as Pre-procedure Electronic Signature(s) Signed: 02/27/2021 5:58:27 PM By: Rhae Hammock RN Entered By: Rhae Hammock on 02/27/2021 14:09:04 -------------------------------------------------------------------------------- Compression Therapy Details Patient Name: Date of Service: Double, [name] 02/27/2021 1:15 PM Medical Record Number:  [id number] Patient Account Number: [id number] Date of Birth/Sex: Treating RN: 15-Oct-1941 (80 y.o. Tonita Phoenix, Lauren Primary Care Provider: Nicholes Stairs Other Clinician: Referring Provider: Treating Provider/Extender: Nyra Jabs in Treatment: 26 Compression Therapy Performed for Wound Assessment: Wound #8 Left,Distal Calcaneus Performed By: Clinician Rhae Hammock, RN Compression Type: Rolena Infante Post Procedure Diagnosis Same as Pre-procedure Electronic Signature(s) Signed: 02/27/2021 5:58:27 PM By: Rhae Hammock RN Entered By: Rhae Hammock on 02/27/2021 14:09:04 -------------------------------------------------------------------------------- Lower Extremity Assessment Details Patient Name: Date of Service: [name] 02/27/2021 1:15 PM Medical Record Number: [id number] Patient Account Number: [id number] Date of Birth/Sex: Treating RN: [birth year] (80 y.o. [name] Primary Care Provider: Nicholes Stairs Other Clinician: Referring Provider: Treating Provider/Extender: Nyra Jabs in Treatment: 26 Edema Assessment Assessed: [name]: No] [name]: No] Edema: [Left: Yes] [Right: Yes] Calf Left: Right: Point of Measurement: From Medial Instep 19.5 cm 19.5 cm Ankle Left: Right: Point of Measurement: From Medial Instep 16 cm 16.5 cm Vascular Assessment Pulses: Dorsalis Pedis Palpable: [Left:Yes] [Right:Yes] Electronic Signature(s) Signed: 02/28/2021 2:59:34 PM By: Levan Hurst RN, BSN Entered By: Levan Hurst on 02/27/2021 13:44:00 -------------------------------------------------------------------------------- Multi Wound Chart Details Patient Name: Date of Service: [name] 02/27/2021 1:15 PM Medical Record Number: [id number] Patient Account Number: [id number] Date of Birth/Sex: Treating RN: 24-Oct-1941 (80 y.o. Tonita Phoenix, Lauren Primary Care Provider: Nicholes Stairs Other  Clinician: Referring Provider: Treating Provider/Extender: Nyra Jabs in Treatment: 26 Vital Signs Height(in): 55 Pulse(bpm): 99 Weight(lbs): 141 Blood Pressure(mmHg): 154/75 Body Mass Index(BMI): 23 Temperature(F): 98.3 Respiratory Rate(breaths/min): 18 Photos: [1:No Photos Left, Anterior Lower Leg] [10:No Photos Left, Proximal, Anterior Lower Leg] [11:No Photos Left, Posterior Lower Leg] Wound Location: [1:Gradually Appeared] [10:Blister] [11:Gradually Appeared] Wounding Event: [1:Venous Leg Ulcer] [10:Diabetic Wound/Ulcer of the Lower] [11:Venous Leg Ulcer] Primary Etiology: [1:Cataracts, Glaucoma, Hypertension, Cataracts, Glaucoma, Hypertension,] [10:Extremity] [11:Cataracts, Glaucoma, Hypertension,] Comorbid History: [1:Type II Diabetes, Osteoarthritis, Type II Diabetes, Osteoarthritis, Neuropathy 06/15/2020] [10:Neuropathy 01/17/2021] [11:Type II Diabetes, Osteoarthritis, Neuropathy 02/03/2021] Date Acquired: [1:26] [10:5] [11:3] Weeks of Treatment: [1:Open] [10:Open] [11:Open] Wound Status: [1:0.6x0.6x0.2] [10:0.5x0.4x0.1] [11:4.5x1x0.2] Measurements L x W x D (cm) [1:0.283] [10:0.157] [11:3.534] A (cm) : rea [1:0.057] [10:0.016] [11:0.707] Volume (cm) : [1:80.00%] [10:19.90%] [11:-921.40%] % Reduction in Area: [1:59.60%] [10:20.00%] [11:-1920.00%] % Reduction in Volume: [1:Full Thickness Without Exposed] [10:Grade 2] [11:Full Thickness Without Exposed] Classification: [1:Support Structures Small] [10:Small] [11:Support Structures Medium] Exudate A mount: [1:Serosanguineous] [10:Serosanguineous] [11:Serosanguineous] Exudate Type: [1:red, brown] [10:red, brown] [11:red, brown] Exudate Color: [1:Flat and Intact] [10:Distinct, outline attached] [11:Well defined, not attached] Wound Margin: [1:Large (67-100%)] [10:Large (67-100%)] [11:Large (67-100%)] Granulation Amount: [1:Red] [10:Pink] [11:Red] Granulation Quality: [1:None Present (0%)] [10:None  Present (0%)] [11:Small (1-33%)] Necrotic Amount: [1:N/A] [10:N/A] [11:Adherent Slough] Necrotic Tissue: [1:Fat Layer (  Subcutaneous Tissue): Yes Fat Layer (Subcutaneous Tissue): Yes Fat Layer (Subcutaneous Tissue): Yes] Exposed Structures: [1:Fascia: No Tendon: No Muscle: No Joint: No Bone: No Small (1-33%)] [10:Fascia: No Tendon: No Muscle: No Joint: No Bone: No Small (1-33%)] [11:Fascia: No Tendon: No Muscle: No Joint: No Bone: No Small (1-33%)] Epithelialization: [1:Compression Therapy] [10:Compression Therapy] [11:Compression Therapy] Wound Number: [id number] [phone] [id number]  Photos: No Photos No Photos No Photos Right, Lateral Lower Leg Left, Medial Lower Leg Left, Distal Calcaneus Wound Location: Gradually Appeared Gradually Appeared Gradually Appeared Wounding Event: Skin Tear Venous Leg Ulcer Diabetic Wound/Ulcer of the Lower Primary Etiology: Extremity Cataracts, Glaucoma, Hypertension, Cataracts, Glaucoma, Hypertension, Cataracts, Glaucoma, Hypertension, Comorbid History: Type II Diabetes, Osteoarthritis, Type II Diabetes, Osteoarthritis, Type II Diabetes, Osteoarthritis, Neuropathy Neuropathy Neuropathy 02/15/2021 11/07/2020 12/05/2020 Date Acquired: 1 16 12  Weeks of Treatment: Open Open Open Wound Status: 3.7x0.6x0.1 0.1x0.1x0.1 0.2x1.1x0.1 Measurements L x W x D (cm) 1.744 0.008 0.173 A (cm) : rea 0.174 0.001 0.017 Volume (cm) : -11.00% 99.00% 72.50% % Reduction in Area: 44.60% 99.40% 73.00% % Reduction in Volume: Full Thickness Without Exposed Full Thickness Without Exposed Grade 2 Classification: Support Structures Support Structures Medium None Present Small Exudate A mount: Serosanguineous N/A Serous Exudate Type: red, brown N/A amber Exudate Color: Distinct, outline attached Distinct, outline attached Distinct, outline attached Wound Margin: Large (67-100%) None Present (0%) Large (67-100%) Granulation Amount: Red, Pink N/A Pink Granulation Quality: Small (1-33%) None  Present (0%) None Present (0%) Necrotic Amount: Eschar, Adherent Slough N/A N/A Necrotic Tissue: Fat Layer (Subcutaneous Tissue): Yes Fascia: No Fat Layer (Subcutaneous Tissue): Yes Exposed Structures: Fascia: No Fat Layer (Subcutaneous Tissue): No Fascia: No Tendon: No Tendon: No Tendon: No Muscle: No Muscle: No Muscle: No Joint: No Joint: No Joint: No Bone: No Bone: No Bone: No None Large (67-100%) Medium (34-66%) Epithelialization: Compression Therapy Compression Therapy Compression Therapy Procedures Performed: Treatment Notes Electronic Signature(s) Signed: 02/27/2021 5:58:27 PM By: Rhae Hammock RN Signed: 02/27/2021 5:59:48 PM By: Linton Ham MD Entered By: Linton Ham on 02/27/2021 14:23:26 -------------------------------------------------------------------------------- Multi-Disciplinary Care Plan Details Patient Name: Date of Service: [place], [name] 02/27/2021 1:15 PM Medical Record Number: [id number] Patient Account Number: [id number] Date of Birth/Sex: Treating RN: 24-Jan-1941 (80 y.o. Tonita Phoenix, Lauren Primary Care Provider: Nicholes Stairs Other Clinician: Referring Provider: Treating Provider/Extender: Nyra Jabs in Treatment: 26 Active Inactive Wound/Skin Impairment Nursing Diagnoses: Knowledge deficit related to ulceration/compromised skin integrity Goals: Patient/caregiver will verbalize understanding of skin care regimen Date Initiated: 08/29/2020 Target Resolution Date: 04/02/2021 Goal Status: Active Ulcer/skin breakdown will have a volume reduction of 30% by week 4 Date Initiated: 08/29/2020 Date Inactivated: 09/26/2020 Target Resolution Date: 09/28/2020 Goal Status: Unmet Unmet Reason: comorbities Ulcer/skin breakdown will have a volume reduction of 50% by week 8 Date Initiated: 09/26/2020 Date Inactivated: 11/07/2020 Target Resolution Date: 10/28/2020 Goal Status: Unmet Unmet Reason:  COMORBITIES Interventions: Assess patient/caregiver ability to obtain necessary supplies Assess patient/caregiver ability to perform ulcer/skin care regimen upon admission and as needed Assess ulceration(s) every visit Notes: Electronic Signature(s) Signed: 02/27/2021 5:58:27 PM By: Rhae Hammock RN Entered By: Rhae Hammock on 02/27/2021 14:09:36 -------------------------------------------------------------------------------- Pain Assessment Details Patient Name: Date of Service: [name] 02/27/2021 1:15 PM Medical Record Number: [id number] Patient Account Number: [id number] Date of Birth/Sex: Treating RN: 07-24-1941 (80 y.o. Tonita Phoenix, Lauren Primary Care Provider: Nicholes Stairs Other Clinician: Referring Provider: Treating Provider/Extender: Nyra Jabs in Treatment: 26 Active Problems Location of Pain Severity and  Description of Pain Patient Has Paino Yes Site Locations Rate the pain. Current Pain Level: 9 Pain Management and Medication Current Pain Management: Electronic Signature(s) Signed: 02/27/2021 5:53:33 PM By: Sandre Kitty Signed: 02/27/2021 5:58:27 PM By: Rhae Hammock RN Entered By: Sandre Kitty on 02/27/2021 13:39:56 -------------------------------------------------------------------------------- Patient/Caregiver Education Details Patient Name: Date of Service: [name] 3/1/2022andnbsp1:15 PM Medical Record Number: [id number] Patient Account Number: [id number] Date of Birth/Gender: Treating RN: [birth year] (80 y.o. Tonita Phoenix, Lauren Primary Care Physician: Nicholes Stairs Other Clinician: Referring Physician: Treating Physician/Extender: Nyra Jabs in Treatment: 26 Education Assessment Education Provided To: Patient Education Topics Provided Basic Hygiene: Wound/Skin Impairment: Methods: Explain/Verbal Responses: State content correctly [employer]) Signed: 02/27/2021 5:58:27 PM By: Rhae Hammock RN Entered By: Rhae Hammock on 02/27/2021 14:10:10 -------------------------------------------------------------------------------- Wound Assessment Details Patient Name: Date of Service: [name] 02/27/2021 1:15 PM Medical Record Number: [id number] Patient Account Number: [id number] Date of Birth/Sex: Treating RN: 1941/11/04 (80 y.o. Tonita Phoenix, Lauren Primary Care Provider: Nicholes Stairs Other Clinician: Referring Provider: Treating Provider/Extender: Nyra Jabs in Treatment: 26 Wound Status Wound Number: 1 Primary Venous Leg Ulcer Etiology: Wound Location: Left, Anterior Lower Leg Wound Open Wounding Event: Gradually Appeared Status: Date Acquired: 06/15/2020 Comorbid Cataracts, Glaucoma, Hypertension, Type II Diabetes, Weeks Of Treatment: 26 History: Osteoarthritis, Neuropathy Clustered Wound: No Photos Wound Measurements Length: (cm) 0.6 Width: (cm) 0.6 Depth: (cm) 0.2 Area: (cm) 0.283 Volume: (cm) 0.057 % Reduction in Area: 80% % Reduction in Volume: 59.6% Epithelialization: Small (1-33%) Tunneling: No Undermining: No Wound Description Classification: Full Thickness Without Exposed Support Structures Wound Margin: Flat and Intact Exudate Amount: Small Exudate Type: Serosanguineous Exudate Color: red, brown Foul Odor After Cleansing: No Slough/Fibrino No Wound Bed Granulation Amount: Large (67-100%) Exposed Structure Granulation Quality: Red Fascia Exposed: No Necrotic Amount: None Present (0%) Fat Layer (Subcutaneous Tissue) Exposed: Yes Tendon Exposed: No Muscle Exposed: No Joint Exposed: No Bone Exposed: No Electronic Signature(s) Signed: 02/27/2021 5:53:33 PM By: Sandre Kitty Signed: 02/27/2021 5:58:27 PM By: Rhae Hammock RN Entered By: Sandre Kitty on 02/27/2021  17:29:25 -------------------------------------------------------------------------------- Wound Assessment Details Patient Name: Date of Service: [name] 02/27/2021 1:15 PM Medical Record Number: [id number] Patient Account Number: [id number] Date of Birth/Sex: Treating RN: 1941-02-04 (80 y.o. Tonita Phoenix, Lauren Primary Care Provider: Nicholes Stairs Other Clinician: Referring Provider: Treating Provider/Extender: Nyra Jabs in Treatment: 26 Wound Status Wound Number: 10 Primary Diabetic Wound/Ulcer of the Lower Extremity Etiology: Wound Location: Left, Proximal, Anterior Lower Leg Wound Open Wounding Event: Blister Status: Date Acquired: 01/17/2021 Comorbid Cataracts, Glaucoma, Hypertension, Type II Diabetes, Weeks Of Treatment: 5 History: Osteoarthritis, Neuropathy Clustered Wound: No Photos Wound Measurements Length: (cm) 0.5 Width: (cm) 0.4 Depth: (cm) 0.1 Area: (cm) 0.157 Volume: (cm) 0.016 % Reduction in Area: 19.9% % Reduction in Volume: 20% Epithelialization: Small (1-33%) Tunneling: No Undermining: No Wound Description Classification: Grade 2 Wound Margin: Distinct, outline attached Exudate Amount: Small Exudate Type: Serosanguineous Exudate Color: red, brown Foul Odor After Cleansing: No Slough/Fibrino No Wound Bed Granulation Amount: Large (67-100%) Exposed Structure Granulation Quality: Pink Fascia Exposed: No Necrotic Amount: None Present (0%) Fat Layer (Subcutaneous Tissue) Exposed: Yes Tendon Exposed: No Muscle Exposed: No Joint Exposed: No Bone Exposed: No Electronic Signature(s) Signed: 02/27/2021 5:53:33 PM By: Sandre Kitty Signed: 02/27/2021 5:58:27 PM By: Rhae Hammock RN Entered By: Sandre Kitty on 02/27/2021 17:29:42 -------------------------------------------------------------------------------- Wound Assessment Details Patient Name: Date of Service:  [place], [name] 02/27/2021  1:15 PM Medical Record Number: [id number] Patient Account Number: [id number] Date of Birth/Sex: Treating RN: 24-Sep-1941 (80 y.o. Tonita Phoenix, Lauren Primary Care Provider: Nicholes Stairs Other Clinician: Referring Provider: Treating Provider/Extender: Nyra Jabs in Treatment: 26 Wound Status Wound Number: 11 Primary Venous Leg Ulcer Etiology: Wound Location: Left, Posterior Lower Leg Wound Open Wounding Event: Gradually Appeared Status: Date Acquired: 02/03/2021 Comorbid Cataracts, Glaucoma, Hypertension, Type II Diabetes, Weeks Of Treatment: 3 History: Osteoarthritis, Neuropathy Clustered Wound: No Photos Wound Measurements Length: (cm) 4.5 Width: (cm) 1 Depth: (cm) 0.2 Area: (cm) 3.534 Volume: (cm) 0.707 % Reduction in Area: -921.4% % Reduction in Volume: -1920% Epithelialization: Small (1-33%) Tunneling: No Undermining: No Wound Description Classification: Full Thickness Without Exposed Support Structures Wound Margin: Well defined, not attached Exudate Amount: Medium Exudate Type: Serosanguineous Exudate Color: red, brown Wound Bed Granulation Amount: Large (67-100%) Granulation Quality: Red Necrotic Amount: Small (1-33%) Necrotic Quality: Adherent Slough Foul Odor After Cleansing: No Slough/Fibrino No Exposed Structure Fascia Exposed: No Fat Layer (Subcutaneous Tissue) Exposed: Yes Tendon Exposed: No Muscle Exposed: No Joint Exposed: No Bone Exposed: No Electronic Signature(s) Signed: 02/27/2021 5:53:33 PM By: Sandre Kitty Signed: 02/27/2021 5:58:27 PM By: Rhae Hammock RN Entered By: Sandre Kitty on 02/27/2021 17:30:43 -------------------------------------------------------------------------------- Wound Assessment Details Patient Name: Date of Service: [name] 02/27/2021 1:15 PM Medical Record Number: [id number] Patient Account Number: [id number] Date of Birth/Sex: Treating RN: Apr 09, 1941 (80 y.o.  Tonita Phoenix, Lauren Primary Care Provider: Nicholes Stairs Other Clinician: Referring Provider: Treating Provider/Extender: Nyra Jabs in Treatment: 26 Wound Status Wound Number: 13 Primary Skin Tear Etiology: Wound Location: Right, Lateral Lower Leg Wound Open Wounding Event: Gradually Appeared Status: Date Acquired: 02/15/2021 Comorbid Cataracts, Glaucoma, Hypertension, Type II Diabetes, Weeks Of Treatment: 1 History: Osteoarthritis, Neuropathy Clustered Wound: No Photos Wound Measurements Length: (cm) 3.7 Width: (cm) 0.6 Depth: (cm) 0.1 Area: (cm) 1.744 Volume: (cm) 0.174 % Reduction in Area: -11% % Reduction in Volume: 44.6% Epithelialization: None Tunneling: No Undermining: No Wound Description Classification: Full Thickness Without Exposed Support Structures Wound Margin: Distinct, outline attached Exudate Amount: Medium Exudate Type: Serosanguineous Exudate Color: red, brown Foul Odor After Cleansing: No Slough/Fibrino Yes Wound Bed Granulation Amount: Large (67-100%) Exposed Structure Granulation Quality: Red, Pink Fascia Exposed: No Necrotic Amount: Small (1-33%) Fat Layer (Subcutaneous Tissue) Exposed: Yes Necrotic Quality: Eschar, Adherent Slough Tendon Exposed: No Muscle Exposed: No Joint Exposed: No Bone Exposed: No Electronic Signature(s) Signed: 02/27/2021 5:53:33 PM By: Sandre Kitty Signed: 02/27/2021 5:58:27 PM By: Rhae Hammock RN Entered By: Sandre Kitty on 02/27/2021 17:31:30 -------------------------------------------------------------------------------- Wound Assessment Details Patient Name: Date of Service: [name] 02/27/2021 1:15 PM Medical Record Number: [id number] Patient Account Number: [id number] Date of Birth/Sex: Treating RN: 1941/09/18 (80 y.o. Tonita Phoenix, Lauren Primary Care Provider: Nicholes Stairs Other Clinician: Referring Provider: Treating Provider/Extender: Nyra Jabs in Treatment: 26 Wound Status Wound Number: 6 Primary Venous Leg Ulcer Etiology: Wound Location: Left, Medial Lower Leg Wound Open Wounding Event: Gradually Appeared Status: Date Acquired: 11/07/2020 Comorbid Cataracts, Glaucoma, Hypertension, Type II Diabetes, Weeks Of Treatment: 16 History: Osteoarthritis, Neuropathy Clustered Wound: No Photos Wound Measurements Length: (cm) 0.1 Width: (cm) 0.1 Depth: (cm) 0.1 Area: (cm) 0.008 Volume: (cm) 0.001 % Reduction in Area: 99% % Reduction in Volume: 99.4% Epithelialization: Large (67-100%) Tunneling: No Undermining: No Wound Description Classification: Full Thickness Without Exposed Support Structures Wound Margin: Distinct, outline  attached Exudate Amount: None Present Foul Odor After Cleansing: No Slough/Fibrino No Wound Bed Granulation Amount: None Present (0%) Exposed Structure Necrotic Amount: None Present (0%) Fascia Exposed: No Fat Layer (Subcutaneous Tissue) Exposed: No Tendon Exposed: No Muscle Exposed: No Joint Exposed: No Bone Exposed: No Electronic Signature(s) Signed: 02/27/2021 5:53:33 PM By: Sandre Kitty Signed: 02/27/2021 5:58:27 PM By: Rhae Hammock RN Entered By: Sandre Kitty on 02/27/2021 17:30:26 -------------------------------------------------------------------------------- Wound Assessment Details Patient Name: Date of Service: [name] 02/27/2021 1:15 PM Medical Record Number: [id number] Patient Account Number: [id number] Date of Birth/Sex: Treating RN: 02-09-1941 (80 y.o. Tonita Phoenix, Lauren Primary Care Provider: Nicholes Stairs Other Clinician: Referring Provider: Treating Provider/Extender: Nyra Jabs in Treatment: 26 Wound Status Wound Number: 8 Primary Diabetic Wound/Ulcer of the Lower Extremity Etiology: Wound Location: Left, Distal Calcaneus Wound Open Wounding Event: Gradually  Appeared Status: Date Acquired: 12/05/2020 Comorbid Cataracts, Glaucoma, Hypertension, Type II Diabetes, Weeks Of Treatment: 12 History: Osteoarthritis, Neuropathy Clustered Wound: No Photos Wound Measurements Length: (cm) 0.2 Width: (cm) 1.1 Depth: (cm) 0.1 Area: (cm) 0.173 Volume: (cm) 0.017 % Reduction in Area: 72.5% % Reduction in Volume: 73% Epithelialization: Medium (34-66%) Tunneling: No Undermining: No Wound Description Classification: Grade 2 Wound Margin: Distinct, outline attached Exudate Amount: Small Exudate Type: Serous Exudate Color: amber Foul Odor After Cleansing: No Slough/Fibrino No Wound Bed Granulation Amount: Large (67-100%) Exposed Structure Granulation Quality: Pink Fascia Exposed: No Necrotic Amount: None Present (0%) Fat Layer (Subcutaneous Tissue) Exposed: Yes Tendon Exposed: No Muscle Exposed: No Joint Exposed: No Bone Exposed: No Electronic Signature(s) Signed: 02/27/2021 5:53:33 PM By: Sandre Kitty Signed: 02/27/2021 5:58:27 PM By: Rhae Hammock RN Entered By: Sandre Kitty on 02/27/2021 17:31:00 -------------------------------------------------------------------------------- Vitals Details Patient Name: Date of Service: [name] 02/27/2021 1:15 PM Medical Record Number: [id number] Patient Account Number: [id number] Date of Birth/Sex: Treating RN: 1941/11/14 (80 y.o. Tonita Phoenix, Lauren Primary Care Provider: Nicholes Stairs Other Clinician: Referring Provider: Treating Provider/Extender: Nyra Jabs in Treatment: 26 Vital Signs Time Taken: 13:39 Temperature (F): 98.3 Height (in): 66 Pulse (bpm): 99 Weight (lbs): 141 Respiratory Rate (breaths/min): 18 Body Mass Index (BMI): 22.8 Blood Pressure (mmHg): 154/75 Reference Range: 80 - 120 mg / dl Electronic Signature(s) Signed: 02/27/2021 5:53:33 PM By: Sandre Kitty Entered By: Sandre Kitty on 02/27/2021 13:39:47

## 2021-03-01 DIAGNOSIS — S81812A Laceration without foreign body, left lower leg, initial encounter: Secondary | ICD-10-CM | POA: Diagnosis not present

## 2021-03-01 DIAGNOSIS — S81811A Laceration without foreign body, right lower leg, initial encounter: Secondary | ICD-10-CM | POA: Diagnosis not present

## 2021-03-01 DIAGNOSIS — I872 Venous insufficiency (chronic) (peripheral): Secondary | ICD-10-CM | POA: Diagnosis not present

## 2021-03-02 DIAGNOSIS — E1122 Type 2 diabetes mellitus with diabetic chronic kidney disease: Secondary | ICD-10-CM | POA: Diagnosis not present

## 2021-03-02 DIAGNOSIS — D631 Anemia in chronic kidney disease: Secondary | ICD-10-CM | POA: Diagnosis not present

## 2021-03-02 DIAGNOSIS — I872 Venous insufficiency (chronic) (peripheral): Secondary | ICD-10-CM | POA: Diagnosis not present

## 2021-03-02 DIAGNOSIS — M1710 Unilateral primary osteoarthritis, unspecified knee: Secondary | ICD-10-CM | POA: Diagnosis not present

## 2021-03-02 DIAGNOSIS — L97821 Non-pressure chronic ulcer of other part of left lower leg limited to breakdown of skin: Secondary | ICD-10-CM | POA: Diagnosis not present

## 2021-03-02 DIAGNOSIS — E1142 Type 2 diabetes mellitus with diabetic polyneuropathy: Secondary | ICD-10-CM | POA: Diagnosis not present

## 2021-03-02 DIAGNOSIS — E1143 Type 2 diabetes mellitus with diabetic autonomic (poly)neuropathy: Secondary | ICD-10-CM | POA: Diagnosis not present

## 2021-03-02 DIAGNOSIS — I129 Hypertensive chronic kidney disease with stage 1 through stage 4 chronic kidney disease, or unspecified chronic kidney disease: Secondary | ICD-10-CM | POA: Diagnosis not present

## 2021-03-02 DIAGNOSIS — L97221 Non-pressure chronic ulcer of left calf limited to breakdown of skin: Secondary | ICD-10-CM | POA: Diagnosis not present

## 2021-03-02 DIAGNOSIS — N1831 Chronic kidney disease, stage 3a: Secondary | ICD-10-CM | POA: Diagnosis not present

## 2021-03-04 ENCOUNTER — Emergency Department (HOSPITAL_COMMUNITY): Payer: Medicare HMO

## 2021-03-04 ENCOUNTER — Encounter (HOSPITAL_COMMUNITY): Payer: Self-pay

## 2021-03-04 ENCOUNTER — Inpatient Hospital Stay (HOSPITAL_COMMUNITY)
Admission: EM | Admit: 2021-03-04 | Discharge: 2021-03-13 | DRG: 071 | Disposition: A | Discharge status: Home Health | Payer: Medicare HMO | Attending: Internal Medicine | Admitting: Internal Medicine

## 2021-03-04 ENCOUNTER — Other Ambulatory Visit: Payer: Self-pay

## 2021-03-04 DIAGNOSIS — I4891 Unspecified atrial fibrillation: Secondary | ICD-10-CM

## 2021-03-04 DIAGNOSIS — R000 Tachycardia, unspecified: Secondary | ICD-10-CM | POA: Diagnosis present

## 2021-03-04 DIAGNOSIS — R339 Retention of urine, unspecified: Secondary | ICD-10-CM | POA: Diagnosis present

## 2021-03-04 DIAGNOSIS — L8990 Pressure ulcer of unspecified site, unspecified stage: Secondary | ICD-10-CM | POA: Insufficient documentation

## 2021-03-04 DIAGNOSIS — E2740 Unspecified adrenocortical insufficiency: Secondary | ICD-10-CM | POA: Diagnosis present

## 2021-03-04 DIAGNOSIS — E538 Deficiency of other specified B group vitamins: Secondary | ICD-10-CM | POA: Diagnosis present

## 2021-03-04 DIAGNOSIS — E860 Dehydration: Secondary | ICD-10-CM | POA: Diagnosis present

## 2021-03-04 DIAGNOSIS — I471 Supraventricular tachycardia: Secondary | ICD-10-CM

## 2021-03-04 DIAGNOSIS — I872 Venous insufficiency (chronic) (peripheral): Secondary | ICD-10-CM | POA: Diagnosis present

## 2021-03-04 DIAGNOSIS — E785 Hyperlipidemia, unspecified: Secondary | ICD-10-CM | POA: Diagnosis present

## 2021-03-04 DIAGNOSIS — Z888 Allergy status to other drugs, medicaments and biological substances status: Secondary | ICD-10-CM

## 2021-03-04 DIAGNOSIS — R443 Hallucinations, unspecified: Secondary | ICD-10-CM | POA: Diagnosis present

## 2021-03-04 DIAGNOSIS — M1710 Unilateral primary osteoarthritis, unspecified knee: Secondary | ICD-10-CM | POA: Diagnosis present

## 2021-03-04 DIAGNOSIS — I129 Hypertensive chronic kidney disease with stage 1 through stage 4 chronic kidney disease, or unspecified chronic kidney disease: Secondary | ICD-10-CM | POA: Diagnosis present

## 2021-03-04 DIAGNOSIS — R0902 Hypoxemia: Secondary | ICD-10-CM | POA: Diagnosis not present

## 2021-03-04 DIAGNOSIS — F05 Delirium due to known physiological condition: Secondary | ICD-10-CM | POA: Diagnosis present

## 2021-03-04 DIAGNOSIS — G9341 Metabolic encephalopathy: Secondary | ICD-10-CM | POA: Diagnosis not present

## 2021-03-04 DIAGNOSIS — Z87442 Personal history of urinary calculi: Secondary | ICD-10-CM

## 2021-03-04 DIAGNOSIS — Z882 Allergy status to sulfonamides status: Secondary | ICD-10-CM

## 2021-03-04 DIAGNOSIS — N1831 Chronic kidney disease, stage 3a: Secondary | ICD-10-CM | POA: Diagnosis present

## 2021-03-04 DIAGNOSIS — I498 Other specified cardiac arrhythmias: Secondary | ICD-10-CM | POA: Diagnosis present

## 2021-03-04 DIAGNOSIS — Z7982 Long term (current) use of aspirin: Secondary | ICD-10-CM

## 2021-03-04 DIAGNOSIS — R4182 Altered mental status, unspecified: Secondary | ICD-10-CM | POA: Diagnosis present

## 2021-03-04 DIAGNOSIS — E7800 Pure hypercholesterolemia, unspecified: Secondary | ICD-10-CM | POA: Diagnosis present

## 2021-03-04 DIAGNOSIS — Z886 Allergy status to analgesic agent status: Secondary | ICD-10-CM

## 2021-03-04 DIAGNOSIS — R531 Weakness: Secondary | ICD-10-CM | POA: Diagnosis not present

## 2021-03-04 DIAGNOSIS — E1143 Type 2 diabetes mellitus with diabetic autonomic (poly)neuropathy: Secondary | ICD-10-CM | POA: Diagnosis present

## 2021-03-04 DIAGNOSIS — Z90710 Acquired absence of both cervix and uterus: Secondary | ICD-10-CM

## 2021-03-04 DIAGNOSIS — D869 Sarcoidosis, unspecified: Secondary | ICD-10-CM | POA: Diagnosis present

## 2021-03-04 DIAGNOSIS — E1122 Type 2 diabetes mellitus with diabetic chronic kidney disease: Secondary | ICD-10-CM | POA: Diagnosis present

## 2021-03-04 DIAGNOSIS — K219 Gastro-esophageal reflux disease without esophagitis: Secondary | ICD-10-CM | POA: Diagnosis present

## 2021-03-04 DIAGNOSIS — G9340 Encephalopathy, unspecified: Secondary | ICD-10-CM | POA: Diagnosis present

## 2021-03-04 DIAGNOSIS — Z8249 Family history of ischemic heart disease and other diseases of the circulatory system: Secondary | ICD-10-CM

## 2021-03-04 DIAGNOSIS — I878 Other specified disorders of veins: Secondary | ICD-10-CM | POA: Diagnosis present

## 2021-03-04 DIAGNOSIS — I4819 Other persistent atrial fibrillation: Secondary | ICD-10-CM | POA: Diagnosis present

## 2021-03-04 DIAGNOSIS — H409 Unspecified glaucoma: Secondary | ICD-10-CM | POA: Diagnosis present

## 2021-03-04 DIAGNOSIS — Z7952 Long term (current) use of systemic steroids: Secondary | ICD-10-CM

## 2021-03-04 DIAGNOSIS — Z974 Presence of external hearing-aid: Secondary | ICD-10-CM

## 2021-03-04 DIAGNOSIS — L89151 Pressure ulcer of sacral region, stage 1: Secondary | ICD-10-CM | POA: Diagnosis present

## 2021-03-04 DIAGNOSIS — H9191 Unspecified hearing loss, right ear: Secondary | ICD-10-CM | POA: Diagnosis present

## 2021-03-04 DIAGNOSIS — Z85828 Personal history of other malignant neoplasm of skin: Secondary | ICD-10-CM

## 2021-03-04 DIAGNOSIS — E1151 Type 2 diabetes mellitus with diabetic peripheral angiopathy without gangrene: Secondary | ICD-10-CM | POA: Diagnosis present

## 2021-03-04 DIAGNOSIS — Z7984 Long term (current) use of oral hypoglycemic drugs: Secondary | ICD-10-CM

## 2021-03-04 DIAGNOSIS — F0390 Unspecified dementia without behavioral disturbance: Secondary | ICD-10-CM | POA: Diagnosis present

## 2021-03-04 DIAGNOSIS — R442 Other hallucinations: Secondary | ICD-10-CM | POA: Diagnosis not present

## 2021-03-04 DIAGNOSIS — Z9049 Acquired absence of other specified parts of digestive tract: Secondary | ICD-10-CM

## 2021-03-04 DIAGNOSIS — Z885 Allergy status to narcotic agent status: Secondary | ICD-10-CM

## 2021-03-04 DIAGNOSIS — E11649 Type 2 diabetes mellitus with hypoglycemia without coma: Secondary | ICD-10-CM | POA: Diagnosis present

## 2021-03-04 DIAGNOSIS — K2270 Barrett's esophagus without dysplasia: Secondary | ICD-10-CM | POA: Diagnosis present

## 2021-03-04 DIAGNOSIS — I050 Rheumatic mitral stenosis: Secondary | ICD-10-CM | POA: Diagnosis present

## 2021-03-04 DIAGNOSIS — I10 Essential (primary) hypertension: Secondary | ICD-10-CM

## 2021-03-04 DIAGNOSIS — Z20822 Contact with and (suspected) exposure to covid-19: Secondary | ICD-10-CM | POA: Diagnosis present

## 2021-03-04 DIAGNOSIS — Z79899 Other long term (current) drug therapy: Secondary | ICD-10-CM

## 2021-03-04 DIAGNOSIS — R269 Unspecified abnormalities of gait and mobility: Secondary | ICD-10-CM | POA: Diagnosis present

## 2021-03-04 DIAGNOSIS — M8580 Other specified disorders of bone density and structure, unspecified site: Secondary | ICD-10-CM | POA: Diagnosis present

## 2021-03-04 DIAGNOSIS — N183 Chronic kidney disease, stage 3 unspecified: Secondary | ICD-10-CM | POA: Diagnosis present

## 2021-03-04 LAB — AMMONIA: Ammonia: 17 umol/L (ref 9–35)

## 2021-03-04 LAB — COMPREHENSIVE METABOLIC PANEL
ALT: 36 U/L (ref 0–44)
AST: 48 U/L — ABNORMAL HIGH (ref 15–41)
Albumin: 3.4 g/dL — ABNORMAL LOW (ref 3.5–5.0)
Alkaline Phosphatase: 39 U/L (ref 38–126)
Anion gap: 14 (ref 5–15)
BUN: 24 mg/dL — ABNORMAL HIGH (ref 8–23)
CO2: 24 mmol/L (ref 22–32)
Calcium: 9.7 mg/dL (ref 8.9–10.3)
Chloride: 102 mmol/L (ref 98–111)
Creatinine, Ser: 0.9 mg/dL (ref 0.44–1.00)
GFR, Estimated: >60 mL/min (ref >60)
Glucose, Bld: 90 mg/dL (ref 70–99)
Potassium: 3.9 mmol/L (ref 3.5–5.1)
Sodium: 140 mmol/L (ref 135–145)
Total Bilirubin: 0.9 mg/dL (ref 0.3–1.2)
Total Protein: 6.4 g/dL — ABNORMAL LOW (ref 6.5–8.1)

## 2021-03-04 LAB — BLOOD GAS, VENOUS
Acid-Base Excess: 1.4 mmol/L (ref 0.0–2.0)
Bicarbonate: 26.3 mmol/L (ref 20.0–28.0)
FIO2: 21
O2 Saturation: 28.1 %
Patient temperature: 98.6
pCO2, Ven: 45 mmHg (ref 44.0–60.0)
pH, Ven: 7.385 (ref 7.250–7.430)
pO2, Ven: 20.5 mmHg — CL (ref 32.0–45.0)

## 2021-03-04 LAB — URINALYSIS, ROUTINE W REFLEX MICROSCOPIC
Bilirubin Urine: NEGATIVE
Glucose, UA: NEGATIVE mg/dL
Hgb urine dipstick: NEGATIVE
Ketones, ur: 5 mg/dL — AB
Leukocytes,Ua: NEGATIVE
Nitrite: NEGATIVE
Protein, ur: NEGATIVE mg/dL
Specific Gravity, Urine: 1.021 (ref 1.005–1.030)
pH: 5 (ref 5.0–8.0)

## 2021-03-04 LAB — CBC WITH DIFFERENTIAL/PLATELET
Abs Immature Granulocytes: 0.11 10*3/uL — ABNORMAL HIGH (ref 0.00–0.07)
Basophils Absolute: 0 10*3/uL (ref 0.0–0.1)
Basophils Relative: 0 %
Eosinophils Absolute: 0 10*3/uL (ref 0.0–0.5)
Eosinophils Relative: 1 %
HCT: 42.6 % (ref 36.0–46.0)
Hemoglobin: 13.4 g/dL (ref 12.0–15.0)
Immature Granulocytes: 1 %
Lymphocytes Relative: 21 %
Lymphs Abs: 1.8 10*3/uL (ref 0.7–4.0)
MCH: 31.1 pg (ref 26.0–34.0)
MCHC: 31.5 g/dL (ref 30.0–36.0)
MCV: 98.8 fL (ref 80.0–100.0)
Monocytes Absolute: 0.7 10*3/uL (ref 0.1–1.0)
Monocytes Relative: 8 %
Neutro Abs: 5.9 10*3/uL (ref 1.7–7.7)
Neutrophils Relative %: 69 %
Platelets: 241 10*3/uL (ref 150–400)
RBC: 4.31 MIL/uL (ref 3.87–5.11)
RDW: 15.1 % (ref 11.5–15.5)
WBC: 8.5 10*3/uL (ref 4.0–10.5)
nRBC: 0 % (ref 0.0–0.2)

## 2021-03-04 LAB — TSH: TSH: 1.121 u[IU]/mL (ref 0.350–4.500)

## 2021-03-04 LAB — LACTIC ACID, PLASMA: Lactic Acid, Venous: 2.4 mmol/L (ref 0.5–1.9)

## 2021-03-04 LAB — SALICYLATE LEVEL: Salicylate Lvl: <7 mg/dL — ABNORMAL LOW (ref 7.0–30.0)

## 2021-03-04 MED ORDER — GABAPENTIN 100 MG PO CAPS
100.0000 mg | ORAL_CAPSULE | Freq: Once | ORAL | Status: AC
  Administered 2021-03-04: 100 mg via ORAL
  Filled 2021-03-04: qty 1

## 2021-03-04 MED ORDER — HALOPERIDOL LACTATE 5 MG/ML IJ SOLN
5.0000 mg | Freq: Once | INTRAMUSCULAR | Status: AC
  Administered 2021-03-04: 5 mg via INTRAMUSCULAR
  Filled 2021-03-04: qty 1

## 2021-03-04 MED ORDER — ZIPRASIDONE MESYLATE 20 MG IM SOLR
10.0000 mg | Freq: Once | INTRAMUSCULAR | Status: AC
  Administered 2021-03-04: 10 mg via INTRAMUSCULAR

## 2021-03-04 MED ORDER — STERILE WATER FOR INJECTION IJ SOLN
INTRAMUSCULAR | Status: AC
  Administered 2021-03-05: 2.1 mL
  Filled 2021-03-04: qty 10

## 2021-03-04 MED ORDER — ZIPRASIDONE MESYLATE 20 MG IM SOLR
20.0000 mg | Freq: Once | INTRAMUSCULAR | Status: DC
  Filled 2021-03-04: qty 20

## 2021-03-04 MED ORDER — LACTATED RINGERS IV BOLUS
1000.0000 mL | Freq: Once | INTRAVENOUS | Status: AC
  Administered 2021-03-04: 1000 mL via INTRAVENOUS

## 2021-03-04 NOTE — ED Provider Notes (Signed)
I received this patient in signout from Dr. Maryan Rued. Briefly, pt w/ PMH including adrenal insufficiency, T2DM, CKD, venous insufficiency w/ LE wounds p/w 3d progressive weakness and malaise. Collapsed this evening, couldn't get up, having hallucinations. Does have hx of encephalopathy requiring admission ultimately thought to be related to polypharmacy including steroids.  Concern for metabolic encephalopathy, has required haldol for agitation. At time of signout, labs overall reassuring, awaiting UA as well as head CT, CXR. Anticipate admission once complete.  Head CT negative acute. UA without obvious infection, TSH normal. CXR without infiltrate. Pt became more tachycardic and on review of EKGs, it appears she's in A fib w/ RVR; I see no hx of A fib. Gave dilt bolus and eventually started on drip for rate control. Added heparin drip.   Discussed admission for AMS, A fib w/ RVR w/ [facility], Dr. Hal Hope.   CHA2DS2-VASc Score = 5  The patient's score is based upon: CHF History: No HTN History: Yes Diabetes History: Yes Stroke History: No Vascular Disease History: No Age Score: 2 Gender Score: 1      ASSESSMENT AND PLAN: Persistent Atrial Fibrillation (ICD10:  I48.19) The patient's CHA2DS2-VASc score is 5, indicating a 7.2% annual risk of stroke.   CRITICAL CARE Performed by: Wenda Overland Nazia Rhines   Total critical care time: 45 minutes  Critical care time was exclusive of separately billable procedures and treating other patients.  Critical care was necessary to treat or prevent imminent or life-threatening deterioration.  Critical care was time spent personally by me on the following activities: development of treatment plan with patient and/or surrogate as well as nursing, discussions with consultants, evaluation of patient's response to treatment, examination of patient, obtaining history from patient or surrogate, ordering and performing treatments and interventions, ordering  and review of laboratory studies, ordering and review of radiographic studies, pulse oximetry and re-evaluation of patient's condition.    Kevona Lupinacci, Wenda Overland, MD 03/05/21 [phone]

## 2021-03-04 NOTE — ED Notes (Signed)
Critical lactic acid 2.4. RN and MD made aware.

## 2021-03-04 NOTE — ED Triage Notes (Signed)
Pt presents from home via [facility], husband reports AMS and hallucinations since midnight. Also report foul smelling urine x 3 days, denies fevers or pain

## 2021-03-04 NOTE — ED Provider Notes (Signed)
, [facility] DEPT Provider Note   CSN: [id number] Arrival date & time: 03/04/21  1937     History Chief Complaint  Patient presents with  . Altered Mental Status    Sonya Goodwin is a 80 y.o. female.  Patient is a 80 year old female with a history of CKD, diabetes, adrenal insufficiency on daily steroids, GERD, gastroparesis, chronic venous stasis with ongoing wounds in bilateral lower extremities and sarcoidosis who is presenting today with paramedics from home due to altered mental status.  Patient is very angry and belligerent.  She is refusing to give any medical history except for screaming do not touch me.  When speaking with the patient's husband he reports that for several days now she has not been herself.  She has had worsening weakness and today was even having difficult time getting out of her lift chair.  They were walking to the bedroom and her legs gave out and she crumpled to the floor.  She then started hallucinating reporting she saw mice in the bed which he showed her were not there.  She has not been acting herself and has had foul-smelling urine for the last 3 days.  He is also reported that she has been having worsening pain in her legs and has been to see her wound doctor and they continue to do the compression wraps on his legs but he does not feel like it is helping.  It is unclear if patient has any of her regular medications today or if she has been taking them for the last few days.  The history is provided by the patient, the spouse and medical records. The history is limited by the condition of the patient.  Altered Mental Status Presenting symptoms: behavior changes   Associated symptoms: hallucinations   Associated symptoms: no fever        Past Medical History:  Diagnosis Date  . Adrenal insufficiency ([place])   . Anemia   . Arthritis   . Barrett's esophagus   . Cancer ([place])    skin cancer, "spot on pancreas"  . CKD  (chronic kidney disease)    Stage III as of 09/2013, kidney stones as well  . Deafness in right ear    wears hearing aids  . Diabetes mellitus without complication ([place])    Type II  . Diabetic gastroparesis ([place])   . Diabetic neuropathy ([place])   . Dysphagia   . GERD (gastroesophageal reflux disease)    takes Protonix  . Glaucoma   . High cholesterol   . History of cervical dysplasia   . History of kidney stones   . Hx: UTI (urinary tract infection)   . Hypertension   . Low back pain    chronic axial- Dr. Nelva Bush  . Osteoarthritis of knee    followed by ortho, Dr. Theda Sers  . Osteopenia    bone density T score-1.8 right femoral neck, 2010, -2.0 september 2012   . Peripheral arterial disease ([place])   . PONV (postoperative nausea and vomiting)   . Sarcoidosis   . Shortness of breath   . Sleep apnea    mild  . Spongiotic dermatitis    right breast, mild inflammation on biopsy  . Thyroid nodule    f/u ultrasound done by Dr. Cyndie Chime, [place] 10/10/09 were stable, smaller , followed since 2005, neg biopsy sev years ago, no further u/s needed per Dr. Cyndie Chime    Patient Active Problem List   Diagnosis Date Noted  .  Generalized weakness   . UTI (urinary tract infection) 06/15/2020  . Acute encephalopathy 06/15/2020  . Hypokalemia 06/15/2020  . Dyslipidemia 11/18/2014  . Sarcoidosis   . Adrenal insufficiency ([place])   . Chronic kidney disease, stage 3a ([place])   . Hypertension   . Type 2 diabetes mellitus with stage 3 chronic kidney disease ([place])     Past Surgical History:  Procedure Laterality Date  . BREAST BIOPSY Bilateral   . BREAST EXCISIONAL BIOPSY Right   . BRONCHOSCOPY  1984  . cataract surgery     both eyes  . CHOLECYSTECTOMY  1981  . COLONOSCOPY    . COLONOSCOPY WITH PROPOFOL N/A 12/19/2015   Procedure: COLONOSCOPY WITH PROPOFOL;  Surgeon: Garlan Fair, MD;  Location: [facility];  Service: Endoscopy;  Laterality: N/A;  . DILATION AND CURETTAGE OF  UTERUS  1965  . ESOPHAGOGASTRODUODENOSCOPY (EGD) WITH ESOPHAGEAL DILATION    . KNEE ARTHROSCOPY  2011  . LUMBAR LAMINECTOMY/DECOMPRESSION MICRODISCECTOMY N/A 11/18/2013   Procedure: DECOMPRESSION L3 - L5  2 LEVELS;  Surgeon: Melina Schools, MD;  Location: [facility];  Service: Orthopedics;  Laterality: N/A;  . ORIF DISTAL RADIUS FRACTURE  2010  . PARTIAL HYSTERECTOMY  1971     OB History   No obstetric history on file.     Family History  Problem Relation Age of Onset  . Cancer Father   . Heart attack Brother   . Heart disease Brother   . Breast cancer Neg Hx     Social History   Tobacco Use  . Smoking status: Never Smoker  . Smokeless tobacco: Never Used  Vaping Use  . Vaping Use: Never used  Substance Use Topics  . Alcohol use: No  . Drug use: No    Home Medications Prior to Admission medications   Medication Sig Start Date End Date Taking? Authorizing Provider  amitriptyline (ELAVIL) 25 MG tablet Take 25 mg by mouth at bedtime.    [provider]  aspirin 81 MG chewable tablet Chew 81 mg by mouth daily.    [provider]  Cholecalciferol (VITAMIN D) 50 MCG (2000 UT) CAPS Take 1 capsule by mouth daily.    [provider]  dorzolamide (TRUSOPT) 2 % ophthalmic solution Place 1 drop into both eyes 2 (two) times daily.    [provider]  gabapentin (NEURONTIN) 100 MG capsule Take 1 capsule (100 mg total) by mouth See admin instructions. Take 100mg  in the morning, 100mg  in the afternoon and 300mg  at bedtime. 06/21/20 09/19/20  Kayleen Memos, DO  meclizine (ANTIVERT) 25 MG tablet Take 25 mg by mouth 2 (two) times daily as needed for dizziness or nausea.     [provider]  methylPREDNISolone (MEDROL) 4 MG tablet Take 4 mg by mouth See admin instructions. Take 4mg  twice daily. Can take up to 32mg  as needed when ill. 05/10/20   [provider]  Multiple Vitamin (MULTIVITAMIN WITH MINERALS) TABS tablet Take 1 tablet by mouth daily.     [provider]  Omega-3 Fatty Acids (FISH OIL PO) Take 2 capsules by mouth daily.    [provider]  ONE TOUCH ULTRA TEST test strip Use to test your blood sugar once daily as directed 12/27/14   [provider]  pantoprazole (PROTONIX) 40 MG tablet Take 1 tablet (40 mg total) by mouth daily. 06/22/20 10/20/20  Kayleen Memos, DO  pioglitazone (ACTOS) 15 MG tablet Take 15 mg by mouth daily.  [provider]  polyethylene glycol (MIRALAX / GLYCOLAX) 17 g packet Take 17 g by mouth daily. 06/22/20   Kayleen Memos, DO  potassium chloride (K-DUR) 10 MEQ tablet Take 1 tablet (10 mEq total) by mouth daily. Patient taking differently: Take 20 mEq by mouth daily. 12/15/15   Sueanne Margarita, MD  rOPINIRole (REQUIP) 2 MG tablet Take 2 mg by mouth at bedtime.     [provider]  rosuvastatin (CRESTOR) 5 MG tablet Take 5 mg by mouth daily.    [provider]  traMADol (ULTRAM) 50 MG tablet Take 1 tablet (50 mg total) by mouth every 6 (six) hours as needed for moderate pain. 11/19/13   Lanae Crumbly, PA-C  triamterene-hydrochlorothiazide (MAXZIDE) 75-50 MG tablet Take 1 tablet by mouth daily.    [provider]    Allergies    Metoclopramide, Codeine, Other, Pain patch [menthol], Statins, Alendronate, Aspirin, Duragesic-100 [fentanyl], Fenofibrate, Fosamax [alendronate sodium], Losartan, Miacalcin [calcitonin (salmon)], Nitrofurantoin, Nsaids, Prolia [denosumab], Sulfamethoxazole-trimethoprim, Tolmetin, Welchol [colesevelam], Zetia [ezetimibe], and Lovaza [omega-3-acid ethyl esters]  Review of Systems   Review of Systems  Unable to perform ROS: Mental status change  Constitutional: Negative for fever.  Psychiatric/Behavioral: Positive for hallucinations.    Physical Exam Updated Vital Signs Ht 5\' 5"  (1.651 m)   Wt 62.5 kg   SpO2 98%   BMI 22.93 kg/m   Physical Exam Vitals and nursing note reviewed.  Constitutional:       Appearance: She is well-developed and well-nourished. She is ill-appearing.     Comments: Angry, thrashing in the bed agitated and screaming anytime anyone tries to touch her.  Chronically ill appearing  HENT:     Head: Normocephalic and atraumatic.     Comments: Moon facies consistent with chronic steroid use    Mouth/Throat:     Mouth: Mucous membranes are dry.  Eyes:     Extraocular Movements: EOM normal.     Pupils: Pupils are equal, round, and reactive to light.  Cardiovascular:     Rate and Rhythm: Regular rhythm. Tachycardia present.     Pulses: Intact distal pulses.     Heart sounds: Normal heart sounds. No murmur heard. No friction rub.  Pulmonary:     Effort: Pulmonary effort is normal.     Breath sounds: Normal breath sounds. No wheezing or rales.  Abdominal:     General: Bowel sounds are normal. There is distension.     Palpations: Abdomen is soft.     Tenderness: There is no abdominal tenderness. There is no guarding or rebound.     Comments: Distended abd without acute pain  Musculoskeletal:        General: No tenderness. Normal range of motion.     Comments: Bilateral lower legs with wound compression dressing.  Feet are purple/blue bilaterally with mild swelling.  Cool to the touch bilaterally.  Skin tear to the right forearm/elbow area.    Skin:    General: Skin is warm and dry.     Findings: No rash.  Neurological:     Mental Status: She is alert.     Cranial Nerves: No cranial nerve deficit.     Motor: No weakness.     Comments: Oriented to person only.  Think she is at [facility].  Moving arms and legs without difficulty.  Psychiatric:        Mood and Affect: Mood and affect normal.     Comments: Patient is irrational, belligerent, agitated  ED Results / Procedures / Treatments   Labs (all labs ordered are listed, but only abnormal results are displayed) Labs Reviewed  CBC WITH DIFFERENTIAL/PLATELET - Abnormal; Notable for the following components:       Result Value   Abs Immature Granulocytes 0.11 (*)    All other components within normal limits  COMPREHENSIVE METABOLIC PANEL - Abnormal; Notable for the following components:   BUN 24 (*)    Total Protein 6.4 (*)    Albumin 3.4 (*)    AST 48 (*)    All other components within normal limits  SALICYLATE LEVEL - Abnormal; Notable for the following components:   Salicylate Lvl <9.3 (*)    All other components within normal limits  LACTIC ACID, PLASMA - Abnormal; Notable for the following components:   Lactic Acid, Venous 2.4 (*)    All other components within normal limits  BLOOD GAS, VENOUS - Abnormal; Notable for the following components:   pO2, Ven 20.5 (*)    All other components within normal limits  URINE CULTURE  CULTURE, BLOOD (ROUTINE X 2)  CULTURE, BLOOD (ROUTINE X 2)  AMMONIA  URINALYSIS, ROUTINE W REFLEX MICROSCOPIC    EKG EKG Interpretation  Date/Time:  Sunday March 04 2021 21:46:37 EST Ventricular Rate:  111 PR Interval:    QRS Duration: 115 QT Interval:  364 QTC Calculation: 495 R Axis:   65 Text Interpretation: Sinus tachycardia with irregular rate Incomplete right bundle branch block Low voltage, precordial leads No significant change since last tracing Confirmed by Blanchie Dessert [phone]) on 03/04/2021 10:08:36 PM   Radiology No results found.  Procedures Procedures   Medications Ordered in ED Medications  lactated ringers bolus 1,000 mL (has no administration in time range)  haloperidol lactate (HALDOL) injection 5 mg (5 mg Intramuscular Given 03/04/21 2055)    ED Course  I have reviewed the triage vital signs and the nursing notes.  Pertinent labs & imaging results that were available during my care of the patient were reviewed by me and considered in my medical decision making (see chart for details).    MDM Rules/Calculators/A&P                         Elderly female with multiple medical problems today presenting due to altered mental status  weakness, hallucinations and foul-smelling urine for several days.  Unclear if patient is taking her medication.  Here patient is extremely uncooperative.  She is screaming anytime anybody gets close to her.  She is refusing to participate in exam or history.  She is reporting that we cannot do anything to her against her will however speaking with her husband he reports he she is not herself.  He gives permission to medicate the patient so that we can obtain blood work and do a Armed forces technical officer.  She does appear to have acute encephalopathy and is not making rational decisions.  I am concerned for acute medical illness at this time.  Because patient after multiple attempts to verbally de-escalate and get the patient to cooperate she is still refusing so she was given IM Haldol.  Concern for adrenal crisis versus electrolyte abnormality versus sepsis with acute infection.  She does not appear to have respiratory issues at this time.  Imaging and labs are pending.  We will do a rectal temp once patient is more cooperative.  10:42 PM Patient is more compliant after Haldol and all labs were able to be  obtained.  Patient is now reporting she is having terrible pain in her legs.  Based on her medical record she reports getting violently ill with any opiate medication.  She continues to be encephalopathic.  She was given IV fluids.  She was also given oral fluid.  Final Clinical Impression(s) / ED Diagnoses Final diagnoses:  None    Rx / DC Orders ED Discharge Orders    None       Blanchie Dessert, MD 03/07/21 [phone]

## 2021-03-04 NOTE — ED Notes (Signed)
Pt refusing all vital signs, provider notified.

## 2021-03-05 ENCOUNTER — Observation Stay (HOSPITAL_COMMUNITY): Payer: Medicare HMO

## 2021-03-05 ENCOUNTER — Observation Stay (HOSPITAL_COMMUNITY)
Admit: 2021-03-05 | Discharge: 2021-03-05 | Disposition: A | Discharge status: Home / Self Care | Payer: Medicare HMO | Attending: Internal Medicine | Admitting: Internal Medicine

## 2021-03-05 ENCOUNTER — Encounter (HOSPITAL_COMMUNITY): Payer: Self-pay | Admitting: Internal Medicine

## 2021-03-05 DIAGNOSIS — R000 Tachycardia, unspecified: Secondary | ICD-10-CM | POA: Diagnosis not present

## 2021-03-05 DIAGNOSIS — R4182 Altered mental status, unspecified: Secondary | ICD-10-CM

## 2021-03-05 DIAGNOSIS — R413 Other amnesia: Secondary | ICD-10-CM | POA: Diagnosis not present

## 2021-03-05 DIAGNOSIS — E2740 Unspecified adrenocortical insufficiency: Secondary | ICD-10-CM | POA: Diagnosis not present

## 2021-03-05 DIAGNOSIS — I10 Essential (primary) hypertension: Secondary | ICD-10-CM | POA: Diagnosis not present

## 2021-03-05 DIAGNOSIS — I4891 Unspecified atrial fibrillation: Secondary | ICD-10-CM | POA: Diagnosis not present

## 2021-03-05 DIAGNOSIS — L8990 Pressure ulcer of unspecified site, unspecified stage: Secondary | ICD-10-CM | POA: Insufficient documentation

## 2021-03-05 DIAGNOSIS — G9340 Encephalopathy, unspecified: Secondary | ICD-10-CM

## 2021-03-05 DIAGNOSIS — I471 Supraventricular tachycardia: Secondary | ICD-10-CM | POA: Diagnosis not present

## 2021-03-05 DIAGNOSIS — I6782 Cerebral ischemia: Secondary | ICD-10-CM | POA: Diagnosis not present

## 2021-03-05 DIAGNOSIS — G9389 Other specified disorders of brain: Secondary | ICD-10-CM | POA: Diagnosis not present

## 2021-03-05 LAB — CBC
HCT: 42.4 % (ref 36.0–46.0)
Hemoglobin: 13.4 g/dL (ref 12.0–15.0)
MCH: 30.9 pg (ref 26.0–34.0)
MCHC: 31.6 g/dL (ref 30.0–36.0)
MCV: 97.9 fL (ref 80.0–100.0)
Platelets: 245 10*3/uL (ref 150–400)
RBC: 4.33 MIL/uL (ref 3.87–5.11)
RDW: 15.1 % (ref 11.5–15.5)
WBC: 6.8 10*3/uL (ref 4.0–10.5)
nRBC: 0 % (ref 0.0–0.2)

## 2021-03-05 LAB — TROPONIN I (HIGH SENSITIVITY)
Troponin I (High Sensitivity): 32 ng/L — ABNORMAL HIGH (ref <18)
Troponin I (High Sensitivity): 33 ng/L — ABNORMAL HIGH (ref <18)

## 2021-03-05 LAB — CBC WITH DIFFERENTIAL/PLATELET
Abs Immature Granulocytes: 0.16 10*3/uL — ABNORMAL HIGH (ref 0.00–0.07)
Basophils Absolute: 0 10*3/uL (ref 0.0–0.1)
Basophils Relative: 1 %
Eosinophils Absolute: 0 10*3/uL (ref 0.0–0.5)
Eosinophils Relative: 0 %
HCT: 39.7 % (ref 36.0–46.0)
Hemoglobin: 12.7 g/dL (ref 12.0–15.0)
Immature Granulocytes: 2 %
Lymphocytes Relative: 10 %
Lymphs Abs: 0.9 10*3/uL (ref 0.7–4.0)
MCH: 31.4 pg (ref 26.0–34.0)
MCHC: 32 g/dL (ref 30.0–36.0)
MCV: 98 fL (ref 80.0–100.0)
Monocytes Absolute: 0.6 10*3/uL (ref 0.1–1.0)
Monocytes Relative: 7 %
Neutro Abs: 7.2 10*3/uL (ref 1.7–7.7)
Neutrophils Relative %: 80 %
Platelets: 236 10*3/uL (ref 150–400)
RBC: 4.05 MIL/uL (ref 3.87–5.11)
RDW: 15.3 % (ref 11.5–15.5)
WBC: 8.9 10*3/uL (ref 4.0–10.5)
nRBC: 0 % (ref 0.0–0.2)

## 2021-03-05 LAB — GLUCOSE, CAPILLARY
Glucose-Capillary: 117 mg/dL — ABNORMAL HIGH (ref 70–99)
Glucose-Capillary: 117 mg/dL — ABNORMAL HIGH (ref 70–99)
Glucose-Capillary: 119 mg/dL — ABNORMAL HIGH (ref 70–99)
Glucose-Capillary: 122 mg/dL — ABNORMAL HIGH (ref 70–99)
Glucose-Capillary: 123 mg/dL — ABNORMAL HIGH (ref 70–99)
Glucose-Capillary: 126 mg/dL — ABNORMAL HIGH (ref 70–99)
Glucose-Capillary: 128 mg/dL — ABNORMAL HIGH (ref 70–99)
Glucose-Capillary: 133 mg/dL — ABNORMAL HIGH (ref 70–99)
Glucose-Capillary: 134 mg/dL — ABNORMAL HIGH (ref 70–99)
Glucose-Capillary: 134 mg/dL — ABNORMAL HIGH (ref 70–99)
Glucose-Capillary: 138 mg/dL — ABNORMAL HIGH (ref 70–99)
Glucose-Capillary: 144 mg/dL — ABNORMAL HIGH (ref 70–99)
Glucose-Capillary: 146 mg/dL — ABNORMAL HIGH (ref 70–99)

## 2021-03-05 LAB — BASIC METABOLIC PANEL
Anion gap: 11 (ref 5–15)
BUN: 20 mg/dL (ref 8–23)
CO2: 26 mmol/L (ref 22–32)
Calcium: 9.3 mg/dL (ref 8.9–10.3)
Chloride: 104 mmol/L (ref 98–111)
Creatinine, Ser: 0.8 mg/dL (ref 0.44–1.00)
GFR, Estimated: >60 mL/min (ref >60)
Glucose, Bld: 117 mg/dL — ABNORMAL HIGH (ref 70–99)
Potassium: 3.4 mmol/L — ABNORMAL LOW (ref 3.5–5.1)
Sodium: 141 mmol/L (ref 135–145)

## 2021-03-05 LAB — ECHOCARDIOGRAM COMPLETE
Area-P 1/2: 2.71 cm2
Height: 66 in
MV VTI: 1.98 cm2
S' Lateral: 2.2 cm
Weight: 2028.23 oz

## 2021-03-05 LAB — LACTIC ACID, PLASMA
Lactic Acid, Venous: 2.5 mmol/L (ref 0.5–1.9)
Lactic Acid, Venous: 1.9 mmol/L (ref 0.5–1.9)
Lactic Acid, Venous: 1.6 mmol/L (ref 0.5–1.9)

## 2021-03-05 LAB — CBG MONITORING, ED
Glucose-Capillary: 112 mg/dL — ABNORMAL HIGH (ref 70–99)
Glucose-Capillary: 56 mg/dL — ABNORMAL LOW (ref 70–99)
Glucose-Capillary: 114 mg/dL — ABNORMAL HIGH (ref 70–99)

## 2021-03-05 LAB — MRSA PCR SCREENING: MRSA by PCR: NEGATIVE

## 2021-03-05 LAB — VITAMIN B12: Vitamin B-12: 1030 pg/mL — ABNORMAL HIGH (ref 180–914)

## 2021-03-05 LAB — ACETAMINOPHEN LEVEL: Acetaminophen (Tylenol), Serum: <10 ug/mL — ABNORMAL LOW (ref 10–30)

## 2021-03-05 LAB — HEPARIN LEVEL (UNFRACTIONATED): Heparin Unfractionated: 1.06 IU/mL — ABNORMAL HIGH (ref 0.30–0.70)

## 2021-03-05 LAB — SARS CORONAVIRUS 2 (TAT 6-24 HRS): SARS Coronavirus 2: NEGATIVE

## 2021-03-05 LAB — CK: Total CK: 315 U/L — ABNORMAL HIGH (ref 38–234)

## 2021-03-05 MED ORDER — HEPARIN BOLUS VIA INFUSION
3500.0000 [IU] | Freq: Once | INTRAVENOUS | Status: AC
  Administered 2021-03-05: 3500 [IU] via INTRAVENOUS
  Filled 2021-03-05: qty 3500

## 2021-03-05 MED ORDER — MUPIROCIN CALCIUM 2 % EX CREA
TOPICAL_CREAM | Freq: Every day | CUTANEOUS | Status: DC
  Filled 2021-03-05: qty 15

## 2021-03-05 MED ORDER — CHLORHEXIDINE GLUCONATE 0.12 % MT SOLN
15.0000 mL | Freq: Two times a day (BID) | OROMUCOSAL | Status: DC
  Administered 2021-03-05 – 2021-03-13 (×17): 15 mL via OROMUCOSAL
  Filled 2021-03-05 (×16): qty 15

## 2021-03-05 MED ORDER — ENOXAPARIN SODIUM 40 MG/0.4ML ~~LOC~~ SOLN: 40.0000 mg | SUBCUTANEOUS | Status: DC

## 2021-03-05 MED ORDER — HYDROCORTISONE NA SUCCINATE PF 100 MG IJ SOLR
50.0000 mg | Freq: Three times a day (TID) | INTRAMUSCULAR | Status: DC
  Administered 2021-03-05 – 2021-03-06 (×4): 50 mg via INTRAVENOUS
  Filled 2021-03-05 (×4): qty 2

## 2021-03-05 MED ORDER — HEPARIN (PORCINE) 25000 UT/250ML-% IV SOLN
800.0000 [IU]/h | INTRAVENOUS | Status: DC
  Administered 2021-03-05: 800 [IU]/h via INTRAVENOUS
  Filled 2021-03-05: qty 250

## 2021-03-05 MED ORDER — DILTIAZEM HCL-DEXTROSE 125-5 MG/125ML-% IV SOLN (PREMIX)
5.0000 mg/h | INTRAVENOUS | Status: DC
  Administered 2021-03-05: 5 mg/h via INTRAVENOUS
  Administered 2021-03-05: 15 mg/h via INTRAVENOUS
  Filled 2021-03-05 (×3): qty 125

## 2021-03-05 MED ORDER — DORZOLAMIDE HCL-TIMOLOL MAL 2-0.5 % OP SOLN
1.0000 [drp] | Freq: Two times a day (BID) | OPHTHALMIC | Status: DC
  Administered 2021-03-05 – 2021-03-13 (×16): 1 [drp] via OPHTHALMIC
  Filled 2021-03-05: qty 10

## 2021-03-05 MED ORDER — DILTIAZEM HCL 25 MG/5ML IV SOLN
15.0000 mg | Freq: Once | INTRAVENOUS | Status: AC
  Administered 2021-03-05: 15 mg via INTRAVENOUS
  Filled 2021-03-05: qty 5

## 2021-03-05 MED ORDER — DORZOLAMIDE HCL 2 % OP SOLN: 1.0000 [drp] | Freq: Two times a day (BID) | OPHTHALMIC | Status: DC

## 2021-03-05 MED ORDER — DEXTROSE 50 % IV SOLN
1.0000 | Freq: Once | INTRAVENOUS | Status: AC
  Administered 2021-03-05: 50 mL via INTRAVENOUS
  Filled 2021-03-05: qty 50

## 2021-03-05 MED ORDER — SODIUM CHLORIDE 0.9 % IV SOLN: INTRAVENOUS | Status: DC

## 2021-03-05 MED ORDER — CHLORHEXIDINE GLUCONATE CLOTH 2 % EX PADS
6.0000 | MEDICATED_PAD | Freq: Every day | CUTANEOUS | Status: DC
  Administered 2021-03-07 – 2021-03-08 (×3): 6 via TOPICAL

## 2021-03-05 MED ORDER — HEPARIN (PORCINE) 25000 UT/250ML-% IV SOLN: 650.0000 [IU]/h | INTRAVENOUS | Status: DC

## 2021-03-05 MED ORDER — ENOXAPARIN SODIUM 40 MG/0.4ML ~~LOC~~ SOLN
40.0000 mg | SUBCUTANEOUS | Status: DC
  Administered 2021-03-05 – 2021-03-12 (×7): 40 mg via SUBCUTANEOUS
  Filled 2021-03-05 (×8): qty 0.4

## 2021-03-05 NOTE — H&P (Signed)
History and Physical    ONESTI BONFIGLIO UYQ:[id number] DOB: December 06, 1941 DOA: 03/04/2021  PCP: Hulan Fess, MD  Patient coming from: Home.  History obtained from patient's husband.  Patient is presently sedated.  Chief Complaint: Agitation confusion and weakness.  HPI: Sonya Goodwin is a 80 y.o. female with history of diabetes mellitus type 2, hypertension, sarcoidosis, adrenal insufficiency who was admitted in June of last year for metabolic encephalopathy at the time cause was presumed to be likely multifactorial including polypharmacy UTI and insufficiency and B12 deficiency was brought to the ER the patient was getting increasingly confused and agitated.  As per the patient has been over the last 24 hours patient has been getting more weak unable to ambulate as usual and towards last evening after supper became more confused and agitated.  Patient has been states he has been administering her medications so unlikely to be having overdosed with any of her medications.  And last 2 days patient also has been getting some Benadryl at least 2 doses for some itching.  Has not had any nausea vomiting or diarrhea.  Has had supper last evening.  ED Course: In the ER patient was confused and agitated was given Haldol and Geodon following which patient was sedated CT head was unremarkable.  Patient was afebrile.  Chest x-ray shows nonspecific changes UA was unremarkable.  Blood cultures and urine cultures were obtained patient became tachycardic with concern for new onset A. fib patient was started on Cardizem infusion after bolus was given heparin started.  And admitted for further work-up.  Covid test is pending.  Labs show mildly elevated AST with CBC unremarkable lactic acid was 2.4 and 2.5 improved to 1.9.  Ammonia level was 17.  EKG shows tachycardia.  While in the ER patient also became hypoglycemic with blood sugar was 56.  D50 was ordered.  Review of Systems: As per HPI, rest all  negative.   Past Medical History:  Diagnosis Date  . Adrenal insufficiency ([place])   . Anemia   . Arthritis   . Barrett's esophagus   . Cancer ([place])    skin cancer, "spot on pancreas"  . CKD (chronic kidney disease)    Stage III as of 09/2013, kidney stones as well  . Deafness in right ear    wears hearing aids  . Diabetes mellitus without complication ([place])    Type II  . Diabetic gastroparesis ([place])   . Diabetic neuropathy ([place])   . Dysphagia   . GERD (gastroesophageal reflux disease)    takes Protonix  . Glaucoma   . High cholesterol   . History of cervical dysplasia   . History of kidney stones   . Hx: UTI (urinary tract infection)   . Hypertension   . Low back pain    chronic axial- Dr. Nelva Bush  . Osteoarthritis of knee    followed by ortho, Dr. Theda Sers  . Osteopenia    bone density T score-1.8 right femoral neck, 2010, -2.0 september 2012   . Peripheral arterial disease ([place])   . PONV (postoperative nausea and vomiting)   . Sarcoidosis   . Shortness of breath   . Sleep apnea    mild  . Spongiotic dermatitis    right breast, mild inflammation on biopsy  . Thyroid nodule    f/u ultrasound done by Dr. Cyndie Chime, [place] 10/10/09 were stable, smaller , followed since 2005, neg biopsy sev years ago, no further u/s needed per Dr. Cyndie Chime  Past Surgical History:  Procedure Laterality Date  . BREAST BIOPSY Bilateral   . BREAST EXCISIONAL BIOPSY Right   . BRONCHOSCOPY  1984  . cataract surgery     both eyes  . CHOLECYSTECTOMY  1981  . COLONOSCOPY    . COLONOSCOPY WITH PROPOFOL N/A 12/19/2015   Procedure: COLONOSCOPY WITH PROPOFOL;  Surgeon: Garlan Fair, MD;  Location: [facility];  Service: Endoscopy;  Laterality: N/A;  . DILATION AND CURETTAGE OF UTERUS  1965  . ESOPHAGOGASTRODUODENOSCOPY (EGD) WITH ESOPHAGEAL DILATION    . KNEE ARTHROSCOPY  2011  . LUMBAR LAMINECTOMY/DECOMPRESSION MICRODISCECTOMY N/A 11/18/2013   Procedure: DECOMPRESSION L3 -  L5  2 LEVELS;  Surgeon: Melina Schools, MD;  Location: [facility] [place] [facility];  Service: Orthopedics;  Laterality: N/A;  . ORIF DISTAL RADIUS FRACTURE  2010  . PARTIAL HYSTERECTOMY  1971     reports that she has never smoked. She has never used smokeless tobacco. She reports that she does not drink alcohol and does not use drugs.  Allergies  Allergen Reactions  . Metoclopramide Other (See Comments)    unknown  . Codeine Nausea And Vomiting    Pt reports that all opiates "make me violently ill".  . Other Itching and Rash    Food-cranberries  . Pain Patch [Menthol] Nausea And Vomiting  . Statins Other (See Comments)    Muscle wasting, attempted Crestor 10mg  biw, then decreased to qw, but still had muscle problems.  Re-challenged after off x several weeks, problem resumed.  Muscle wasting, attempted Crestor 10mg  biw, then decreased to qw, but still had muscle problems.  Re-challenged after off x several weeks, problem resumed.   . Alendronate Other (See Comments)    Stomach pain  . Aspirin Tinitus  . Duragesic-100 [Fentanyl] Tinitus    Duragesic patch  . Fenofibrate Other (See Comments)    Arthralgias and myalgias  . Fosamax [Alendronate Sodium] Other (See Comments)    Stomach pain  . Losartan Other (See Comments)    Tremors  . Miacalcin [Calcitonin ([place])] Other (See Comments)    unknown  . Nitrofurantoin Other (See Comments)    Tremors  . Nsaids Other (See Comments)    Avoid with CKD III  . Prolia [Denosumab] Other (See Comments)    Due to kidney function   . Sulfamethoxazole-Trimethoprim Nausea And Vomiting  . Tolmetin Other (See Comments)    Avoid with CKD III Avoid with CKD III  . Welchol [Colesevelam] Other (See Comments)    Myalgias  . Zetia [Ezetimibe]     Arthralgias and myalgias  . Lovaza [Omega-3-Acid Ethyl Esters] Palpitations    Family History  Problem Relation Age of Onset  . Cancer Father   . Heart attack Brother   . Heart disease Brother   . Breast cancer Neg Hx      Prior to Admission medications   Medication Sig Start Date End Date Taking? Authorizing Provider  amitriptyline (ELAVIL) 25 MG tablet Take 25 mg by mouth at bedtime.    [provider]  aspirin 81 MG chewable tablet Chew 81 mg by mouth daily.    [provider]  Cholecalciferol (VITAMIN D) 50 MCG (2000 UT) CAPS Take 1 capsule by mouth daily.    [provider]  dorzolamide (TRUSOPT) 2 % ophthalmic solution Place 1 drop into both eyes 2 (two) times daily.    [provider]  gabapentin (NEURONTIN) 100 MG capsule Take 1 capsule (100 mg total) by mouth See admin instructions. Take 100mg   in the morning, 100mg  in the afternoon and 300mg  at bedtime. 06/21/20 09/19/20  Kayleen Memos, DO  meclizine (ANTIVERT) 25 MG tablet Take 25 mg by mouth 2 (two) times daily as needed for dizziness or nausea.     [provider]  methylPREDNISolone (MEDROL) 4 MG tablet Take 4 mg by mouth See admin instructions. Take 4mg  twice daily. Can take up to 32mg  as needed when ill. 05/10/20   [provider]  Multiple Vitamin (MULTIVITAMIN WITH MINERALS) TABS tablet Take 1 tablet by mouth daily.    [provider]  Omega-3 Fatty Acids (FISH OIL PO) Take 2 capsules by mouth daily.    [provider]  ONE TOUCH ULTRA TEST test strip Use to test your blood sugar once daily as directed 12/27/14   [provider]  pantoprazole (PROTONIX) 40 MG tablet Take 1 tablet (40 mg total) by mouth daily. 06/22/20 10/20/20  Kayleen Memos, DO  pioglitazone (ACTOS) 15 MG tablet Take 15 mg by mouth daily.    [provider]  polyethylene glycol (MIRALAX / GLYCOLAX) 17 g packet Take 17 g by mouth daily. 06/22/20   Kayleen Memos, DO  potassium chloride (K-DUR) 10 MEQ tablet Take 1 tablet (10 mEq total) by mouth daily. Patient taking differently: Take 20 mEq by mouth daily. 12/15/15   Sueanne Margarita, MD  rOPINIRole (REQUIP) 2 MG tablet Take 2 mg by mouth at  bedtime.     [provider]  rosuvastatin (CRESTOR) 5 MG tablet Take 5 mg by mouth daily.    [provider]  traMADol (ULTRAM) 50 MG tablet Take 1 tablet (50 mg total) by mouth every 6 (six) hours as needed for moderate pain. 11/19/13   Lanae Crumbly, PA-C  triamterene-hydrochlorothiazide (MAXZIDE) 75-50 MG tablet Take 1 tablet by mouth daily.    [provider]    Physical Exam: Constitutional: Moderately built and nourished. Vitals:   03/05/21 0240 03/05/21 0313 03/05/21 0330 03/05/21 0410  BP: 130/76 129/74 119/77 138/65  Pulse: (!) 117 (!) 121 (!) 123 98  Resp: 14 15 15 16   Temp:      TempSrc:      SpO2: 99% 100% 100% 95%  Weight:      Height:       Eyes: Anicteric no pallor. ENMT: No discharge from the ears eyes nose or mouth. Neck: No neck rigidity no mass felt. Respiratory: No rhonchi or crepitations. Cardiovascular: S1-S2 heard. Abdomen: Soft nontender bowel sounds present. Musculoskeletal: Bilateral lower extremity wraps. Skin: Bilateral lower extremity dressing. Neurologic: Patient is presently sedated pupils are reacting to light. Psychiatric: Presently sedated.   Labs on Admission: I have personally reviewed following labs and imaging studies  CBC: Recent Labs  Lab 03/04/21 2046  WBC 8.5  NEUTROABS 5.9  HGB 13.4  HCT 42.6  MCV 98.8  PLT 829   Basic Metabolic Panel: Recent Labs  Lab 03/04/21 2046  NA 140  K 3.9  CL 102  CO2 24  GLUCOSE 90  BUN 24*  CREATININE 0.90  CALCIUM 9.7   GFR: Estimated Creatinine Clearance: 45.6 mL/min (by C-G formula based on SCr of 0.9 mg/dL). Liver Function Tests: Recent Labs  Lab 03/04/21 2046  AST 48*  ALT 36  ALKPHOS 39  BILITOT 0.9  PROT 6.4*  ALBUMIN 3.4*   No results for input(s): LIPASE, AMYLASE in the last 168 hours. Recent Labs  Lab 03/04/21 2046  AMMONIA 17   Coagulation Profile: No  results for input(s): INR, PROTIME in the last 168 hours. Cardiac Enzymes: No  results for input(s): CKTOTAL, CKMB, CKMBINDEX, TROPONINI in the last 168 hours. BNP (last 3 results) No results for input(s): PROBNP in the last 8760 hours. HbA1C: No results for input(s): HGBA1C in the last 72 hours. CBG: Recent Labs  Lab 03/05/21 0347 03/05/21 0429  GLUCAP 56* 112*   Lipid Profile: No results for input(s): CHOL, HDL, LDLCALC, TRIG, CHOLHDL, LDLDIRECT in the last 72 hours. Thyroid Function Tests: Recent Labs    03/04/21 2046  TSH 1.121   Anemia Panel: No results for input(s): VITAMINB12, FOLATE, FERRITIN, TIBC, IRON, RETICCTPCT in the last 72 hours. Urine analysis:    Component Value Date/Time   COLORURINE YELLOW 03/04/2021 2046   APPEARANCEUR HAZY (A) 03/04/2021 2046   LABSPEC 1.021 03/04/2021 2046   PHURINE 5.0 03/04/2021 2046   GLUCOSEU NEGATIVE 03/04/2021 2046   HGBUR NEGATIVE 03/04/2021 2046   BILIRUBINUR NEGATIVE 03/04/2021 2046   KETONESUR 5 (A) 03/04/2021 2046   PROTEINUR NEGATIVE 03/04/2021 2046   NITRITE NEGATIVE 03/04/2021 2046   LEUKOCYTESUR NEGATIVE 03/04/2021 2046   Sepsis Labs: @LABRCNTIP (procalcitonin:4,lacticidven:4) ) Recent Results (from the past 240 hour(s))  Blood culture (routine x 2)     Status: None (Preliminary result)   Collection Time: 03/04/21  8:46 PM   Specimen: BLOOD LEFT WRIST  Result Value Ref Range Status   Specimen Description   Final    BLOOD LEFT WRIST Performed at [facility] [address], [place], [place] [address]    Special Requests   Final    BOTTLES DRAWN AEROBIC AND ANAEROBIC Blood Culture adequate volume Performed at [facility] [address] [place], [place] [address]    Culture PENDING  Incomplete   Report Status PENDING  Incomplete     Radiological Exams on Admission: CT Head Wo Contrast  Result Date: 03/05/2021 CLINICAL DATA:  Altered mental status EXAM: CT HEAD WITHOUT CONTRAST TECHNIQUE: Contiguous axial images were obtained from the base of the skull  through the vertex without intravenous contrast. COMPARISON:  None. FINDINGS: Brain: No evidence of acute infarction, hemorrhage, hydrocephalus, extra-axial collection or mass lesion/mass effect. Chronic atrophic and ischemic changes are noted. Vascular: No hyperdense vessel or unexpected calcification. Skull: Normal. Negative for fracture or focal lesion. Sinuses/Orbits: No acute finding. Other: None. IMPRESSION: Chronic atrophic and ischemic changes without acute abnormality. Electronically Signed   By: Inez Catalina M.D.   On: 03/05/2021 00:16   DG Chest Port 1 View  Result Date: 03/05/2021 CLINICAL DATA:  Altered mental status EXAM: PORTABLE CHEST 1 VIEW COMPARISON:  Radiographs 06/15/2020, CT 03/11/2008 FINDINGS: Low lung volumes and atelectatic changes with vascular crowding. Chronic asymmetric elevation of the right hemidiaphragm is again noted. Some additional hazy and streaky opacities are present in both lungs. No pneumothorax or effusion. Cardiomediastinal contours are quite similar to prior counting for differences in technique. The aorta is calcified. No acute osseous or soft tissue abnormality. Degenerative changes are present in the imaged spine and shoulders. Telemetry leads overlie the chest. IMPRESSION: 1. Low lung volumes and atelectatic changes with vascular crowding. 2. Additional hazy and streaky opacities in both lungs may reflect additional atelectasis, though edema or infection not fully excluded in the appropriate clinical context. 3.  Aortic Atherosclerosis (ICD10-I70.0). Electronically Signed   By: Lovena Le M.D.   On: 03/05/2021 00:04    EKG: Independently reviewed.  Tachycardia concerning for A. fib.  Assessment/Plan Principal Problem:  Tachycardia Active Problems:   Sarcoidosis   Adrenal insufficiency (HCC)   Chronic kidney disease, stage 3a (HCC)   Type 2 diabetes mellitus with stage 3 chronic kidney disease ([place])   Acute encephalopathy    1. Acute encephalopathy  cause not clear presently afebrile.  CT head is unremarkable.  Ammonia levels are normal.  I have ordered EEG and MRI brain and also will check RPR B12 levels.  We will keep patient n.p.o. for now until patient is more alert awake.  Follow cultures. 2. History of sarcoidosis with renal insufficiency presently on stress dose steroids. 3. Tachycardic with regular rhythm concerning for new onset A. fib.  Patient was started on Cardizem infusion after bolus.  Also on heparin.  Will consult cardiology.  Will check 2D echo.  Trend cardiac markers.  TSH is 4. Diabetes mellitus type 2 presently hypoglycemic blood sugar in the 56.  Will check CBGs every hourly for now until blood sugars are more stable.  Presently on stress dose steroids. 5. Hypertension presently on Cardizem infusion. 6. Previous history of B12 deficiency as per the chart in June 2021.  Will check B12 levels. 7. Chronic bilateral lower extremity wound.  Will consult wound team.  Since patient is encephalopathic with that tachycardia will need close monitoring for any further worsening in inpatient status.   DVT prophylaxis: Heparin. Code Status: Full code. Family Communication: Patient's husband. Disposition Plan: Home when stable. Consults called: Cardiology and wound team. Admission status: Inpatient.   Rise Patience MD [facility] Pager [phone].  If 7PM-7AM, please contact night-coverage [email or web address] Password [facility]  03/05/2021, 4:35 AM

## 2021-03-05 NOTE — TOC Initial Note (Signed)
Transition of Care [facility]) - Initial/Assessment Note    Patient Details  Name: Sonya Goodwin MRN: [id number] Date of Birth: Sep 22, 1941  [facility] of [facility]) CM/SW Contact:    Leeroy Cha, RN Phone Number: 03/05/2021, 9:30 AM  Clinical Narrative:                 80 y.o. female with history of diabetes mellitus type 2, hypertension, sarcoidosis, adrenal insufficiency who was admitted in June of last year for metabolic encephalopathy at the time cause was presumed to be likely multifactorial including polypharmacy UTI and insufficiency and B12 deficiency was brought to the ER the patient was getting increasingly confused and agitated.  As per the patient has been over the last 24 hours patient has been getting more weak unable to ambulate as usual and towards last evening after supper became more confused and agitated.  Patient has been states he has been administering her medications so unlikely to be having overdosed with any of her medications.  And last 2 days patient also has been getting some Benadryl at least 2 doses for some itching.  Has not had any nausea vomiting or diarrhea.  Has had supper last evening.  ED Course: In the ER patient was confused and agitated was given Haldol and Geodon following which patient was sedated CT head was unremarkable.  Patient was afebrile.  Chest x-ray shows nonspecific changes UA was unremarkable.  Blood cultures and urine cultures were obtained patient became tachycardic with concern for new onset A. fib patient was started on Cardizem infusion after bolus was given heparin started.  And admitted for further work-up.  Covid test is pending.  Labs show mildly elevated AST with CBC unremarkable lactic acid was 2.4 and 2.5 improved to 1.9.  Ammonia level was 17.  EKG shows tachycardia.  While in the ER patient also became hypoglycemic with blood sugar was 56.  D50 was ordered. PLAN: following for toc needs and dc planning.  Expected Discharge  Plan: Home/Self Care Barriers to Discharge: Continued Medical Work up   Patient Goals and CMS Choice Patient states their goals for this hospitalization and ongoing recovery are:: to go back home CMS Medicare.gov Compare Post Acute Care list provided to:: Patient    Expected Discharge Plan and Services Expected Discharge Plan: Home/Self Care   Discharge Planning Services: CM Consult   Living arrangements for the past 2 months: Single Family Home                                      Prior Living Arrangements/Services Living arrangements for the past 2 months: Single Family Home Lives with:: Spouse Patient language and need for interpreter reviewed:: Yes Do you feel safe going back to the place where you live?: Yes      Need for Family Participation in Patient Care: Yes (Comment) Care giver support system in place?: Yes (comment)   Criminal Activity/Legal Involvement Pertinent to Current Situation/Hospitalization: No - Comment as needed  Activities of Daily Living Home Assistive Devices/Equipment: Built-in shower seat,Cane (specify quad or straight),Bedside commode/3-in-1,Hearing aid,Hand-held shower hose,Walker (specify type),CBG Meter,Other (Comment) (ramp entrance, front wheeled walker, single point cane, right hearing aid) ADL Screening (condition at time of admission) Patient's cognitive ability adequate to safely complete daily activities?: No (patient unresponsive) Is the patient deaf or have difficulty hearing?: Yes (wears hearing aid on right) Does the patient have difficulty seeing,  even when wearing glasses/contacts?: Yes Does the patient have difficulty concentrating, remembering, or making decisions?: Yes Patient able to express need for assistance with ADLs?: No Does the patient have difficulty dressing or bathing?: Yes Independently performs ADLs?: No Communication: Dependent (patient unresponsive) Is this a change from baseline?: Change from baseline,  expected to last >3 days Dressing (OT): Dependent Is this a change from baseline?: Change from baseline, expected to last >3 days Grooming: Dependent Is this a change from baseline?: Change from baseline, expected to last >3 days Feeding: Dependent Is this a change from baseline?: Change from baseline, expected to last >3 days Bathing: Dependent Is this a change from baseline?: Change from baseline, expected to last >3 days Toileting: Dependent Is this a change from baseline?: Change from baseline, expected to last >3days In/Out Bed: Dependent Is this a change from baseline?: Change from baseline, expected to last >3 days Walks in Home: Dependent Is this a change from baseline?: Change from baseline, expected to last >3 days Does the patient have difficulty walking or climbing stairs?: Yes (secondary to weakness) Weakness of Legs: Both Weakness of Arms/Hands: Both  Permission Sought/Granted                  Emotional Assessment Appearance:: Appears stated age Attitude/Demeanor/Rapport: Engaged Affect (typically observed): Calm Orientation: : Oriented to Place,Oriented to Self,Oriented to  Time,Oriented to Situation Alcohol / Substance Use: Not Applicable Psych Involvement: No (comment)  Admission diagnosis:  Atrial fibrillation with RVR ([place]) [I48.91] Tachycardia [R00.0] Patient Active Problem List   Diagnosis Date Noted  . Tachycardia 03/05/2021  . Pressure injury of skin 03/05/2021  . Generalized weakness   . UTI (urinary tract infection) 06/15/2020  . Acute encephalopathy 06/15/2020  . Hypokalemia 06/15/2020  . Dyslipidemia 11/18/2014  . Sarcoidosis   . Adrenal insufficiency ([place])   . Chronic kidney disease, stage 3a ([place])   . Hypertension   . Type 2 diabetes mellitus with stage 3 chronic kidney disease ([place])    PCP:  Hulan Fess, MD Pharmacy:   [id number] [place], [place]  [address] Phone: [phone] Fax: [phone]     Social Determinants of Health (SDOH) Interventions    Readmission Risk Interventions No flowsheet data found.

## 2021-03-05 NOTE — Progress Notes (Signed)
EEG Completed; Results Pending  

## 2021-03-05 NOTE — Progress Notes (Signed)
Pharmacy Brief Note  Patient to transition off heparin drip to Lovenox for VTE prophylaxis now that afib is r/o. Will initiate Lovenox 40 mg daily and sign off. Thank you for allowing pharmacy to participate in this patient's care.   Ulice Dash D

## 2021-03-05 NOTE — Consult Note (Signed)
Cardiology Consultation:   Patient ID: Sonya Goodwin MRN: [id number]; DOB: November 10, 1941  Admit date: 03/04/2021 Date of Consult: 03/05/2021  PCP:  Hulan Fess, MD   [place]  Cardiologist:  Skeet Latch, MD new  Patient Profile:   Sonya Goodwin is a 80 y.o. female with a hx of HTN, DM2, HLD, adrenal insufficiency, CKD stage III, and sarcoidosis who is being seen today for the evaluation of Afib at the request of Dr. Pietro Cassis.  History of Present Illness:   Sonya Goodwin previously followed with Dr. Radford Pax in 2015 for HTN and HLD. She has has mild OSA.  She is intolerant to all statins and she did not want to take fish oil due to palpitations. She has seen [facility] for venous insufficiency and ulcers on LE extremities. 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 She was last seen 02/13/21 and noted that venous stasis ulcers are improved with compression therapy.   She was hospitalized 05/2020 after taking multiple medrol tablets due to feeling weak. Polypharmacy and dehydration felt to be cause of acute metabolic encephalopathy. She also had a presumed UTI and was treated.   She presented to [facility] with increasing confusion and agitations. Upon arrival, she was given haldol and geodon. CT head unremarkable. Lactic acid was 2.5 --> 1.9.  EKG concerning for possible new onset atrial fibrillation. Cardiology was consulted. Cardizem gtt and heparin gtt started.    HS troponin 32 --> 33 AST 48 UA negative TSH 1.12  Telemetry review shows sinus rhythm in the 80s with long PR interval.     Past Medical History:  Diagnosis Date  . Adrenal insufficiency ([place])   . Anemia   . Arthritis   . Barrett's esophagus   . Cancer ([place])    skin cancer, "spot on pancreas"  . CKD (chronic kidney disease)    Stage III as of 09/2013, kidney stones as well  . Deafness in right ear    wears  hearing aids  . Diabetes mellitus without complication ([place])    Type II  . Diabetic gastroparesis ([place])   . Diabetic neuropathy ([place])   . Dysphagia   . GERD (gastroesophageal reflux disease)    takes Protonix  . Glaucoma   . High cholesterol   . History of cervical dysplasia   . History of kidney stones   . Hx: UTI (urinary tract infection)   . Hypertension   . Low back pain    chronic axial- Dr. Nelva Bush  . Osteoarthritis of knee    followed by ortho, Dr. Theda Sers  . Osteopenia    bone density T score-1.8 right femoral neck, 2010, -2.0 september 2012   . Peripheral arterial disease ([place])   . PONV (postoperative nausea and vomiting)   . Sarcoidosis   . Shortness of breath   . Sleep apnea    mild  . Spongiotic dermatitis    right breast, mild inflammation on biopsy  . Thyroid nodule    f/u ultrasound done by Dr. Cyndie Chime, [place] 10/10/09 were stable, smaller , followed since 2005, neg biopsy sev years ago, no further u/s needed per Dr. Cyndie Chime    Past Surgical History:  Procedure Laterality Date  . BREAST BIOPSY Bilateral   . BREAST EXCISIONAL BIOPSY Right   . BRONCHOSCOPY  1984  . cataract surgery     both eyes  . CHOLECYSTECTOMY  1981  . COLONOSCOPY    .  COLONOSCOPY WITH PROPOFOL N/A 12/19/2015   Procedure: COLONOSCOPY WITH PROPOFOL;  Surgeon: Garlan Fair, MD;  Location: [facility];  Service: Endoscopy;  Laterality: N/A;  . DILATION AND CURETTAGE OF UTERUS  1965  . ESOPHAGOGASTRODUODENOSCOPY (EGD) WITH ESOPHAGEAL DILATION    . KNEE ARTHROSCOPY  2011  . LUMBAR LAMINECTOMY/DECOMPRESSION MICRODISCECTOMY N/A 11/18/2013   Procedure: DECOMPRESSION L3 - L5  2 LEVELS;  Surgeon: Melina Schools, MD;  Location: [facility] [facility];  Service: Orthopedics;  Laterality: N/A;  . ORIF DISTAL RADIUS FRACTURE  2010  . PARTIAL HYSTERECTOMY  1971     Home Medications:  Prior to Admission medications   Medication Sig Start Date End Date Taking? Authorizing Provider  amitriptyline  (ELAVIL) 25 MG tablet Take 25 mg by mouth at bedtime.   Yes [provider]  aspirin 81 MG chewable tablet Chew 81 mg by mouth daily.   Yes [provider]  Cholecalciferol (VITAMIN D) 50 MCG (2000 UT) CAPS Take 2,000 Units by mouth daily.   Yes [provider]  diphenhydrAMINE (BENADRYL) 25 MG tablet Take 25 mg by mouth every 6 (six) hours as needed for itching.   Yes [provider]  dorzolamide (TRUSOPT) 2 % ophthalmic solution Place 1 drop into both eyes 2 (two) times daily.   Yes [provider]  gabapentin (NEURONTIN) 100 MG capsule Take 1 capsule (100 mg total) by mouth See admin instructions. Take 100mg  in the morning, 100mg  in the afternoon and 300mg  at bedtime. Patient taking differently: Take 100 mg by mouth 2 (two) times daily. 06/21/20 09/19/20 Yes Hall, Carole N, DO  methylPREDNISolone (MEDROL) 4 MG tablet Take 4 mg by mouth See admin instructions. Take 4mg  twice daily. Can take up to 32mg  as needed when ill. 05/10/20  Yes [provider]  Multiple Vitamin (MULTIVITAMIN WITH MINERALS) TABS tablet Take 1 tablet by mouth daily.   Yes [provider]  Omega-3 Fatty Acids (FISH OIL PO) Take 2 capsules by mouth daily.   Yes [provider]  pantoprazole (PROTONIX) 40 MG tablet Take 1 tablet (40 mg total) by mouth daily. 06/22/20 10/20/20 Yes Hall, Carole N, DO  pioglitazone (ACTOS) 30 MG tablet Take 30 mg by mouth daily. 02/14/21  Yes [provider]  potassium chloride (K-DUR) 10 MEQ tablet Take 1 tablet (10 mEq total) by mouth daily. Patient taking differently: Take 20 mEq by mouth daily. 12/15/15  Yes Turner, Eber Hong, MD  rOPINIRole (REQUIP) 2 MG tablet Take 2 mg by mouth at bedtime.    Yes [provider]  rosuvastatin (CRESTOR) 5 MG tablet Take 5 mg by mouth daily.   Yes [provider]  traMADol (ULTRAM) 50 MG tablet Take 1 tablet (50 mg total) by mouth every 6 (six) hours as needed for  moderate pain. 11/19/13  Yes Lanae Crumbly, PA-C  triamterene-hydrochlorothiazide (MAXZIDE) 75-50 MG tablet Take 1 tablet by mouth daily.   Yes [provider]  dorzolamide-timolol (COSOPT) 22.3-6.8 MG/ML ophthalmic solution Place 1 drop into both eyes 2 (two) times daily. 02/16/21   [provider]  ONE TOUCH ULTRA TEST test strip Use to test your blood sugar once daily as directed 12/27/14   [provider]    Inpatient Medications: Scheduled Meds: . chlorhexidine  15 mL Mouth Rinse BID  . [START ON 03/06/2021] Chlorhexidine Gluconate Cloth  6 each Topical Q0600  . dorzolamide  1 drop Both Eyes BID  . hydrocortisone sod succinate (SOLU-CORTEF) inj  50 mg Intravenous  Q8H   Continuous Infusions: . sodium chloride 75 mL/hr at 03/05/21 0854  . diltiazem (CARDIZEM) infusion 15 mg/hr (03/05/21 0351)  . heparin 800 Units/hr (03/05/21 0411)   PRN Meds:   Allergies:    Allergies  Allergen Reactions  . Metoclopramide Other (See Comments)    unknown  . Codeine Nausea And Vomiting    Pt reports that all opiates "make me violently ill".  . Other Itching and Rash    Food-cranberries  . Pain Patch [Menthol] Nausea And Vomiting  . Statins Other (See Comments)    Muscle wasting, attempted Crestor 10mg  biw, then decreased to qw, but still had muscle problems.  Re-challenged after off x several weeks, problem resumed.  Muscle wasting, attempted Crestor 10mg  biw, then decreased to qw, but still had muscle problems.  Re-challenged after off x several weeks, problem resumed.   . Alendronate Other (See Comments)    Stomach pain  . Aspirin Tinitus  . Duragesic-100 [Fentanyl] Tinitus    Duragesic patch  . Fenofibrate Other (See Comments)    Arthralgias and myalgias  . Fosamax [Alendronate Sodium] Other (See Comments)    Stomach pain  . Losartan Other (See Comments)    Tremors  . Miacalcin [Calcitonin ([place])] Other (See Comments)    unknown  . Nitrofurantoin Other  (See Comments)    Tremors  . Nsaids Other (See Comments)    Avoid with CKD III  . Prolia [Denosumab] Other (See Comments)    Due to kidney function   . Sulfamethoxazole-Trimethoprim Nausea And Vomiting  . Tolmetin Other (See Comments)    Avoid with CKD III Avoid with CKD III  . Welchol [Colesevelam] Other (See Comments)    Myalgias  . Zetia [Ezetimibe]     Arthralgias and myalgias  . Lovaza [Omega-3-Acid Ethyl Esters] Palpitations    Social History:   Social History   Socioeconomic History  . Marital status: Married    Spouse name: Not on file  . Number of children: Not on file  . Years of education: Not on file  . Highest education level: Not on file  Occupational History  . Not on file  Tobacco Use  . Smoking status: Never Smoker  . Smokeless tobacco: Never Used  Vaping Use  . Vaping Use: Never used  Substance and Sexual Activity  . Alcohol use: No  . Drug use: No  . Sexual activity: Not on file  Other Topics Concern  . Not on file  Social History Narrative  . Not on file   Social Determinants of Health   Financial Resource Strain: Not on file  Food Insecurity: Not on file  Transportation Needs: Not on file  Physical Activity: Not on file  Stress: Not on file  Social Connections: Not on file  Intimate Partner Violence: Not on file    Family History:    Family History  Problem Relation Age of Onset  . Cancer Father   . Heart attack Brother   . Heart disease Brother   . Breast cancer Neg Hx      ROS:  Please see the history of present illness.   All other ROS reviewed and negative.     Physical Exam/Data:   Vitals:   03/05/21 0640 03/05/21 0700 03/05/21 0800 03/05/21 0825  BP: 128/69 (!) 159/58 (!) 149/129 (!) 124/59  Pulse:  (!) 106 (!) 107   Resp: 17 (!) 21 17 17   Temp: 99.3 F (37.4 C)  98.9 F (37.2 C)  TempSrc: Oral  Axillary   SpO2:  95% 97%   Weight:      Height:       No intake or output data in the 24 hours ending 03/05/21  0905 Last 3 Weights 03/05/2021 03/04/2021 02/13/2021  Weight (lbs) 126 lb 12.2 oz 137 lb 12.6 oz 138 lb  Weight (kg) 57.5 kg 62.5 kg 62.596 kg     VS:  BP (!) 119/56   Pulse 75   Temp 98.9 F (37.2 C) (Axillary)   Resp 15   Ht 5\' 6"  (1.676 m)   Wt 57.5 kg   SpO2 98%   BMI 20.46 kg/m  , BMI Body mass index is 20.46 kg/m. GENERAL: Chronically ill-appearing HEENT: Cushingoid face. Pupils equal round and reactive, fundi not visualized, oral mucosa unremarkable NECK:  No jugular venous distention, waveform within normal limits, carotid upstroke brisk and symmetric, no bruits3 LUNGS:  Clear to auscultation bilaterally HEART:  RRR.  PMI not displaced or sustained,S1 and S2 within normal limits, no S3, no S4, no clicks, no rubs, no murmurs ABD:  Flat, positive bowel sounds normal in frequency in pitch, no bruits, no rebound, no guarding, no midline pulsatile mass, no hepatomegaly, no splenomegaly EXT:  2 plus pulses throughout, no edema, no cyanosis no clubbing SKIN:  Mutliple ecchymoses. NEURO: Unable to assess  PSYCH:  Unable to assess.  Patient unresponsive.   EKG:  The EKG was personally reviewed and demonstrates:  Sinus tachycardia HR 116 --> artifact Telemetry:  Telemetry was personally reviewed and demonstrates:  Sinus rhythm in the 80s, long PR interval  Relevant CV Studies:  Echo pending  Laboratory Data:  High Sensitivity Troponin:   Recent Labs  Lab 03/05/21 0455 03/05/21 0646  TROPONINIHS 32* 33*     Chemistry Recent Labs  Lab 03/04/21 2046 03/05/21 0818  NA 140 141  K 3.9 3.4*  CL 102 104  CO2 24 26  GLUCOSE 90 117*  BUN 24* 20  CREATININE 0.90 0.80  CALCIUM 9.7 9.3  GFRNONAA >60 >60  ANIONGAP 14 11    Recent Labs  Lab 03/04/21 2046  PROT 6.4*  ALBUMIN 3.4*  AST 48*  ALT 36  ALKPHOS 39  BILITOT 0.9   Hematology Recent Labs  Lab 03/04/21 2046 03/05/21 0455 03/05/21 0818  WBC 8.5 6.8 8.9  RBC 4.31 4.33 4.05  HGB 13.4 13.4 12.7  HCT 42.6  42.4 39.7  MCV 98.8 97.9 98.0  MCH 31.1 30.9 31.4  MCHC 31.5 31.6 32.0  RDW 15.1 15.1 15.3  PLT 241 245 236   BNPNo results for input(s): BNP, PROBNP in the last 168 hours.  DDimer No results for input(s): DDIMER in the last 168 hours.   Radiology/Studies:  CT Head Wo Contrast  Result Date: 03/05/2021 CLINICAL DATA:  Altered mental status EXAM: CT HEAD WITHOUT CONTRAST TECHNIQUE: Contiguous axial images were obtained from the base of the skull through the vertex without intravenous contrast. COMPARISON:  None. FINDINGS: Brain: No evidence of acute infarction, hemorrhage, hydrocephalus, extra-axial collection or mass lesion/mass effect. Chronic atrophic and ischemic changes are noted. Vascular: No hyperdense vessel or unexpected calcification. Skull: Normal. Negative for fracture or focal lesion. Sinuses/Orbits: No acute finding. Other: None. IMPRESSION: Chronic atrophic and ischemic changes without acute abnormality. Electronically Signed   By: Inez Catalina M.D.   On: 03/05/2021 00:16   DG Chest Port 1 View  Result Date: 03/05/2021 CLINICAL DATA:  Altered mental status EXAM: PORTABLE CHEST 1 VIEW COMPARISON:  Radiographs 06/15/2020, CT 03/11/2008 FINDINGS: Low lung volumes and atelectatic changes with vascular crowding. Chronic asymmetric elevation of the right hemidiaphragm is again noted. Some additional hazy and streaky opacities are present in both lungs. No pneumothorax or effusion. Cardiomediastinal contours are quite similar to prior counting for differences in technique. The aorta is calcified. No acute osseous or soft tissue abnormality. Degenerative changes are present in the imaged spine and shoulders. Telemetry leads overlie the chest. IMPRESSION: 1. Low lung volumes and atelectatic changes with vascular crowding. 2. Additional hazy and streaky opacities in both lungs may reflect additional atelectasis, though edema or infection not fully excluded in the appropriate clinical context. 3.   Aortic Atherosclerosis (ICD10-I70.0). Electronically Signed   By: Lovena Le M.D.   On: 03/05/2021 00:04     Assessment and Plan:   Abnormal EKG - EKG with artifact - sinus tachycardia - telemetry with sinus rhythm with HR 80s, long PR interval -    Hypertension - maintained on triamterene-HCTZ - on hold for cardizem drip   Hyperlipidemia - on 4 mg crestor - hold for elevated AST -    DM2 - per primary    Sarcoidosis Adrenal insufficiency - chronic steroid    Risk Assessment/Risk Scores:        [facility] will sign off.   Medication Recommendations:  Stop IV diltiazem when able to take oral meds. Other recommendations (labs, testing, etc):   Follow up as an outpatient:  none  For questions or updates, please contact [place] Please consult [email or web address] for contact info under    Signed, Ledora Bottcher, PA  03/05/2021 9:05 AM  Attending addendum:  History and all data above reviewed.  Patient examined.  I agree with the findings as above.  All available labs, radiology testing, previous records reviewed. Agree with documented assessment and plan. Ms. Garciagarcia is a 35F with diabetes, CKD, adrenal insufficiency, and chronic venous stasis admitted with encephalopathy.  She was weak for a couple days and her husband notes that she collapsed on the floor.  He was unable to get her up for nearly 18 hours and she became increasingly confused.  She also began hallucinating.  Cardiology consulted for atrial fibrillation.  On review of her EKGs she had very frequent PACs, but no atrial fibrillation.  Heart rates have improved with IV diltiazem.  The cause of her encephalopathy remains unclear.  HS troponin is mildly elevated to 32-->33.  Her husband notes that she has no cardiac history and hasn't complained of chest pain or palpitations.  I suspect that her ectopy is related to her general medical state.  Thyroid function is normal.  Echo is pending.  Suspect  that her troponin is demand ischemia rather than obstructive coronary disease.  We will add a CK in case rhabdomyolysis is contributing.  Once she is able to tolerate oral medication would transition to her home antihypertensive regimen and off IV diltiazem.  Tiffany C. Oval Linsey, MD, [facility]  03/05/2021 12:27 PM

## 2021-03-05 NOTE — Procedures (Signed)
Patient Name: Sonya Goodwin  MRN: [id number]  Epilepsy Attending: Lora Havens  Referring Physician/Provider: Dr. Gean Birchwood Date: 03/05/2021 Duration: 23.59 minutes  Patient history: 80 year old female with altered mental status. EEG to evaluate for seizures.  Level of alertness: Lethargic  AEDs during EEG study: None  Technical aspects: This EEG study was done with scalp electrodes positioned according to the 10-20 International system of electrode placement. Electrical activity was acquired at a sampling rate of 500Hz  and reviewed with a high frequency filter of 70Hz  and a low frequency filter of 1Hz . EEG data were recorded continuously and digitally stored.   Description: No clear posterior dominant rhythm was seen. EEG showed continuous generalized 5 to 7 Hz theta slowing as well as intermittent generalized 2 to 3 Hz delta slowing. Hyperventilation and photic stimulation were not performed.     ABNORMALITY -Continuous slow, generalized  IMPRESSION: This study is suggestive of moderate diffuse encephalopathy, nonspecific etiology. No seizures or epileptiform discharges were seen throughout the recording.  Lametria Klunk Barbra Sarks

## 2021-03-05 NOTE — Care Plan (Signed)
Attempted to get MRI when patient was in ED, pt was transferred to ICU before we were able to get her.

## 2021-03-05 NOTE — Progress Notes (Signed)
ANTICOAGULATION CONSULT NOTE - Initial Consult  Pharmacy Consult for Heparin Indication: atrial fibrillation  Allergies  Allergen Reactions   Metoclopramide Other (See Comments)    unknown   Codeine Nausea And Vomiting    Pt reports that all opiates "make me violently ill".   Other Itching and Rash    Food-cranberries   Pain Patch [Menthol] Nausea And Vomiting   Statins Other (See Comments)    Muscle wasting, attempted Crestor 10mg  biw, then decreased to qw, but still had muscle problems.  Re-challenged after off x several weeks, problem resumed.  Muscle wasting, attempted Crestor 10mg  biw, then decreased to qw, but still had muscle problems.  Re-challenged after off x several weeks, problem resumed.    Alendronate Other (See Comments)    Stomach pain   Aspirin Tinitus   Duragesic-100 [Fentanyl] Tinitus    Duragesic patch   Fenofibrate Other (See Comments)    Arthralgias and myalgias   Fosamax [Alendronate Sodium] Other (See Comments)    Stomach pain   Losartan Other (See Comments)    Tremors   Miacalcin [Calcitonin ([place])] Other (See Comments)    unknown   Nitrofurantoin Other (See Comments)    Tremors   Nsaids Other (See Comments)    Avoid with CKD III   Prolia [Denosumab] Other (See Comments)    Due to kidney function    Sulfamethoxazole-Trimethoprim Nausea And Vomiting   Tolmetin Other (See Comments)    Avoid with CKD III Avoid with CKD III   Welchol [Colesevelam] Other (See Comments)    Myalgias   Zetia [Ezetimibe]     Arthralgias and myalgias   Lovaza [Omega-3-Acid Ethyl Esters] Palpitations    Patient Measurements: Height: 5\' 5"  (165.1 cm) Weight: 62.5 kg (137 lb 12.6 oz) IBW/kg (Calculated) : 57 Heparin Dosing Weight: TBW  Vital Signs: Temp: 98.2 F (36.8 C) (03/06 2147) Temp Source: Rectal (03/06 2147) BP: 129/74 (03/07 0313) Pulse Rate: 121 (03/07 0313)  Labs: Recent Labs    03/04/21 2046  HGB 13.4  HCT 42.6  PLT 241   CREATININE 0.90    Estimated Creatinine Clearance: 45.6 mL/min (by C-G formula based on SCr of 0.9 mg/dL).   Medical History: Past Medical History:  Diagnosis Date   Adrenal insufficiency ([place])    Anemia    Arthritis    Barrett's esophagus    Cancer ([facility])    skin cancer, "spot on pancreas"   CKD (chronic kidney disease)    Stage III as of 09/2013, kidney stones as well   Deafness in right ear    wears hearing aids   Diabetes mellitus without complication ([place])    Type II   Diabetic gastroparesis ([place])    Diabetic neuropathy ([place])    Dysphagia    GERD (gastroesophageal reflux disease)    takes Protonix   Glaucoma    High cholesterol    History of cervical dysplasia    History of kidney stones    Hx: UTI (urinary tract infection)    Hypertension    Low back pain    chronic axial- Dr. Nelva Bush   Osteoarthritis of knee    followed by ortho, Dr. Theda Sers   Osteopenia    bone density T score-1.8 right femoral neck, 2010, -2.0 september 2012    Peripheral arterial disease ([facility])    PONV (postoperative nausea and vomiting)    Sarcoidosis    Shortness of breath    Sleep apnea    mild   Spongiotic dermatitis  right breast, mild inflammation on biopsy   Thyroid nodule    f/u ultrasound done by Dr. Cyndie Chime, [place] 10/10/09 were stable, smaller , followed since 2005, neg biopsy sev years ago, no further u/s needed per Dr. Cyndie Chime    Medications:  Infusions:   diltiazem (CARDIZEM) infusion 10 mg/hr (03/05/21 0321)    Assessment: 80 yo F with new onset Afib.  CHADS2-VASc score = 5. Pharmacy consulted to dose IV heparin.  Baseline CBC WNL.  No bleeding noted.  Not on anticoagulation PTA.   Goal of Therapy:  Heparin level 0.3-0.7 units/ml Monitor platelets by anticoagulation protocol: Yes   Plan:  Heparin 3500 units IV x1 followed by continuous IV infusion at 800 Units/hr Check heparin level 8h after infusion starts Daily  heparin level & CBC while on heparin Monitor for s/sx of bleeding  Netta Cedars PharmD 03/05/2021,3:37 AM

## 2021-03-05 NOTE — Progress Notes (Signed)
  Echocardiogram 2D Echocardiogram has been performed.  Jennette Dubin 03/05/2021, 2:11 PM

## 2021-03-05 NOTE — Progress Notes (Signed)
PROGRESS NOTE  Sonya Goodwin  DOB: 01/04/1941  PCP: Sonya Fess, MD RFX:[id number]  DOA: 03/04/2021  LOS: 0 days   Chief Complaint  Patient presents with  . Altered Mental Status    Brief narrative: TAMBERLY Goodwin is a 80 y.o. female with PMH significant for DM2, HTN, HLD, intolerant to statins, mild OSA, sarcoidosis, adrenal insufficiency, lower extremity venous insufficiency and ulcers  and multiple hospitalizations in the past for altered mental status, presumably secondary to polypharmacy. Patient was brought to the ED on 3/6 with complaint of worsening confusion, agitation, generalized weakness. In the ED, patient was afebrile and hemodynamically stable.  Patient was confused, agitated and hence given Haldol and also Geodon following which she remained sedated.   CT scan of head was unremarkable. Chest x-ray shows nonspecific changes. UA was unremarkable.   Blood cultures and urine cultures were sent.  Patient also noted to be tachycardic with a suspicion of A. fib with RVR.  Started on Cardizem and heparin drip. Labs showed mildly elevated AST, lactic acid elevated to 2.4, ammonia normal at 17, unremarkable CBC. She was hypoglycemic in the ED at 56 corrected with dextrose IV. Admitted to hospitalist service.  Subjective: Patient was seen and examined this morning. At the time of my evaluation, patient was deeply asleep.  She was snoring, will barely try to open eyes on sternal rub.  Pupils equally reactive to light bilaterally. Hemodynamically stable. Later this morning, patient woke up and was able to have a conversation.  Assessment/Plan: Acute encephalopathy -Unclear etiology.  Unremarkable CT head.  Ammonia level normal. -EEG normal.  MRI pending. -Similar presentation in the past with unclear etiology.  Polypharmacy is still a concern although patient and her husband denies. -This morning, patient was deeply sedated probably because of Haldol and Geodon she  got in the ED last night.  Later this morning, she started to wake up.  Continue to monitor mental status.  Elevated lactic acid level -Probably due to dehydration.  No evidence of sepsis at this time. -Lactate level improved with hydration. Recent Labs  Lab 03/04/21 2046 03/04/21 2342 03/05/21 0142 03/05/21 0455 03/05/21 0818  WBC 8.5  --   --  6.8 8.9  LATICACIDVEN 2.4* 2.5* 1.9  --  1.6   Risk of polypharmacy -Patient is on multiple mood altering medications at home including Elavil 25 mg daily, Neurontin 100 mg 3 times daily and 300 mg at bedtime, Requip 2 mg at bedtime, tramadol 50 mg every 6 hours as needed and Benadryl as needed for itching. -Needs to cut down on these medications.  Sinus tachycardia -In the ED, patient was tachycardic.  Initially suspected to have reviewed with RVR given Cardizem infusion and heparin infusion. -Cardiology consult appreciated this morning.  A. fib ruled out.  Cardizem drip and heparin drip was stopped. -Pending echocardiogram.  Diabetes mellitus Hypoglycemia  -A1c 6.3 on June 2021. -On Actos 30 mg daily at home.  Resume the same from tomorrow. Recent Labs  Lab 03/05/21 0903 03/05/21 1002 03/05/21 1130 03/05/21 1232 03/05/21 1342  GLUCAP 122* 138* 134* 146* 144*   Essential hypertension -Home Meds include Triamterene 75 mg daily, hydrochlorothiazide 50 mg daily, -Continue to monitor blood pressure.  Plan to resume BP meds from tomorrow.  Vitamin B12 deficiency -B12 level remains elevated.  I do not see any supplement in her home list. Recent Labs    06/16/20 0421 06/17/20 1000 06/21/20 0424 03/04/21 2046 03/05/21 0455 03/05/21 0818  MCV 97.4   < >  --    < >  97.9 98.0  VITAMINB12 153*  --  >7,500*  --  1,030*  --    < > = values in this interval not displayed.   Chronic bilateral lower extremity wound -Wound culture team consulted. -Patient has seen [facility] for venous insufficiency and ulcers on LE extremities. 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 She was last seen 02/13/21 and noted that venous stasis ulcers are improved with compression therapy.   Hyperlipidemia -intolerant to all statins and she did not want to take fish oil due to palpitations.   Sarcoidosis -On Medrol oral 4 mg twice daily  Mobility: Encourage ambulation.  PT eval Code Status:   Code Status: Full Code  Nutritional status: Body mass index is 20.46 kg/m.     Diet Order            Diet NPO time specified  Diet effective now                 DVT prophylaxis: Lovenox subcu   Antimicrobials:  None Fluid: Normal saline at 45 mill per hour Consultants: Cardiology Family Communication:  Not at bedside  Status is: Observation  Dispo: The patient is from: Home              Anticipated d/c is to: Home, pending PT eval              Patient currently is not medically stable to d/c.   Difficult to place patient No  Infusions:  . sodium chloride 75 mL/hr at 03/05/21 0854    Scheduled Meds: . chlorhexidine  15 mL Mouth Rinse BID  . [START ON 03/06/2021] Chlorhexidine Gluconate Cloth  6 each Topical Q0600  . dorzolamide-timolol  1 drop Both Eyes BID  . hydrocortisone sod succinate (SOLU-CORTEF) inj  50 mg Intravenous Q8H  . mupirocin cream   Topical Daily    Antimicrobials: Anti-infectives (From admission, onward)   None      PRN meds:    Objective: Vitals:   03/05/21 1112 03/05/21 1200  BP: (!) 104/54 (!) 119/56  Pulse: 79 75  Resp: 14 15  Temp:  97.9 F (36.6 C)  SpO2: 91% 98%   No intake or output data in the 24 hours ending 03/05/21 1446 Filed Weights   03/04/21 1951 03/05/21 0600  Weight: 62.5 kg 57.5 kg   Weight change:  Body mass index is 20.46 kg/m.   Physical Exam: General exam: Pleasant, elderly Caucasian female.  Not in physical distress Skin: No rashes, lesions or ulcers. HEENT: Atraumatic, normocephalic,  no obvious bleeding Lungs: Clear to auscultation bilaterally CVS: Regular rate and rhythm, no murmur GI/Abd soft, nontender, nondistended, bowel sound present CNS: Sedated this morning.  Improving later Psychiatry: Mood appropriate Extremities: No pedal edema, no calf tenderness  Data Review: I have personally reviewed the laboratory data and studies available.  Recent Labs  Lab 03/04/21 2046 03/05/21 0455 03/05/21 0818  WBC 8.5 6.8 8.9  NEUTROABS 5.9  --  7.2  HGB 13.4 13.4 12.7  HCT 42.6 42.4 39.7  MCV 98.8 97.9 98.0  PLT 241 245 236   Recent Labs  Lab 03/04/21 2046 03/05/21 0818  NA 140 141  K 3.9 3.4*  CL 102 104  CO2 24 26  GLUCOSE 90 117*  BUN 24* 20  CREATININE 0.90 0.80  CALCIUM 9.7 9.3    F/u labs ordered Unresulted Labs (From admission, onward)  Start     Ordered   03/06/21 3578  Basic metabolic panel  Daily,   R      03/05/21 0750   03/06/21 0500  CBC with Differential/Platelet  Daily,   R      03/05/21 0750   03/06/21 0500  Magnesium  Tomorrow morning,   STAT        03/05/21 0750   03/06/21 0500  Phosphorus  Tomorrow morning,   R        03/05/21 0750   03/05/21 2330  Heparin level (unfractionated)  Once-Timed,   TIMED       Question:  Specimen collection method  Answer:  Lab=Lab collect   03/05/21 1433   03/05/21 0551  Urine rapid drug screen (hosp performed)  ONCE - STAT,   STAT        03/05/21 0551   03/05/21 0500  CBC  Daily,   R      03/05/21 0426   03/05/21 0433  RPR  ONCE - STAT,   STAT        03/05/21 0433   03/04/21 2046  Urine Culture  Once,   STAT        03/04/21 2046          Signed, Terrilee Croak, MD [facility] 03/05/2021

## 2021-03-05 NOTE — Progress Notes (Signed)
ANTICOAGULATION CONSULT NOTE - Follow Up Consult   Pharmacy Consult for Heparin Indication: atrial fibrillation  Allergies  Allergen Reactions  . Metoclopramide Other (See Comments)    unknown  . Codeine Nausea And Vomiting    Pt reports that all opiates "make me violently ill".  . Other Itching and Rash    Food-cranberries  . Pain Patch [Menthol] Nausea And Vomiting  . Statins Other (See Comments)    Muscle wasting, attempted Crestor 10mg  biw, then decreased to qw, but still had muscle problems.  Re-challenged after off x several weeks, problem resumed.  Muscle wasting, attempted Crestor 10mg  biw, then decreased to qw, but still had muscle problems.  Re-challenged after off x several weeks, problem resumed.   . Alendronate Other (See Comments)    Stomach pain  . Aspirin Tinitus  . Duragesic-100 [Fentanyl] Tinitus    Duragesic patch  . Fenofibrate Other (See Comments)    Arthralgias and myalgias  . Fosamax [Alendronate Sodium] Other (See Comments)    Stomach pain  . Losartan Other (See Comments)    Tremors  . Miacalcin [Calcitonin ([place])] Other (See Comments)    unknown  . Nitrofurantoin Other (See Comments)    Tremors  . Nsaids Other (See Comments)    Avoid with CKD III  . Prolia [Denosumab] Other (See Comments)    Due to kidney function   . Sulfamethoxazole-Trimethoprim Nausea And Vomiting  . Tolmetin Other (See Comments)    Avoid with CKD III Avoid with CKD III  . Welchol [Colesevelam] Other (See Comments)    Myalgias  . Zetia [Ezetimibe]     Arthralgias and myalgias  . Lovaza [Omega-3-Acid Ethyl Esters] Palpitations    Patient Measurements: Height: 5\' 6"  (167.6 cm) Weight: 57.5 kg (126 lb 12.2 oz) IBW/kg (Calculated) : 59.3 Heparin Dosing Weight: TBW  Vital Signs: Temp: 97.9 F (36.6 C) (03/07 1200) Temp Source: Axillary (03/07 1200) BP: 119/56 (03/07 1200) Pulse Rate: 75 (03/07 1200)  Labs: Recent Labs    03/04/21 2046 03/05/21 0455  03/05/21 0646 03/05/21 0818 03/05/21 1223  HGB 13.4 13.4  --  12.7  --   HCT 42.6 42.4  --  39.7  --   PLT 241 245  --  236  --   HEPARINUNFRC  --   --   --   --  1.06*  CREATININE 0.90  --   --  0.80  --   CKTOTAL  --   --   --   --  315*  TROPONINIHS  --  32* 33*  --   --     Estimated Creatinine Clearance: 51.8 mL/min (by C-G formula based on SCr of 0.8 mg/dL).  Assessment: 80 yo F with new onset Afib.  CHADS2-VASc score = 5. Pharmacy consulted to dose IV heparin.   03/05/21 1:12 PM   HL supra-therapeutic with infusion going at 800 units/hr  No bleeding per RN  Goal of Therapy:  Heparin level 0.3-0.7 units/ml Monitor platelets by anticoagulation protocol: Yes   Plan:   Hold heparin infusion x 1 hour  After 1 hour, resume heparin at 650 units/hr  Recheck HL 8 hours after resuming infusion  Daily HL once stable  Daily CBC  Ulice Dash D 03/05/2021,1:11 PM

## 2021-03-05 NOTE — ED Notes (Signed)
Critical lactic acid of 2.5 MD made aware

## 2021-03-05 NOTE — Consult Note (Signed)
[facility] Nurse Consult Note: Reason for Consult: Bilateral lower extremity wounds. Wears weekly Unna boots at wound care center.   LEgs are erythematous and warm today.I will implement topical wound care but will not resume compression today.  Wound type: chronic nonhealing venous wounds.  Pressure Injury POA: NA Measurement: Right lateral leg below knee:  4.5 cm x 3 cm x 0.2 cm  Lateral abrasion anterior lower leg below knee:  1 cm skin tear Left lateral lower leg with scattered 0.3 cm abrasions.   Wound POL:IDCVU red Drainage (amount, consistency, odor) moderate serosanguinous  No odor  Periwound: erythema and warm to touch.   Dressing procedure/placement/frequency:Cleanse legs with soap and water and pat dry.  Apply mupirocin ointment to open wounds.  Cover with foam dressings.  Re-apply daily and change foam every three days.  Will not follow at this time.  Please re-consult if needed.  Domenic Moras MSN, RN, FNP-BC [facility]ON Wound, Ostomy, Continence Nurse Pager [phone]

## 2021-03-06 DIAGNOSIS — I471 Supraventricular tachycardia: Secondary | ICD-10-CM | POA: Diagnosis not present

## 2021-03-06 DIAGNOSIS — R000 Tachycardia, unspecified: Secondary | ICD-10-CM | POA: Diagnosis not present

## 2021-03-06 DIAGNOSIS — G9340 Encephalopathy, unspecified: Secondary | ICD-10-CM | POA: Diagnosis not present

## 2021-03-06 DIAGNOSIS — I872 Venous insufficiency (chronic) (peripheral): Secondary | ICD-10-CM | POA: Diagnosis not present

## 2021-03-06 DIAGNOSIS — S81812A Laceration without foreign body, left lower leg, initial encounter: Secondary | ICD-10-CM | POA: Diagnosis not present

## 2021-03-06 DIAGNOSIS — S81811A Laceration without foreign body, right lower leg, initial encounter: Secondary | ICD-10-CM | POA: Diagnosis not present

## 2021-03-06 DIAGNOSIS — I10 Essential (primary) hypertension: Secondary | ICD-10-CM | POA: Diagnosis not present

## 2021-03-06 LAB — RPR: RPR Ser Ql: NONREACTIVE

## 2021-03-06 LAB — CBC WITH DIFFERENTIAL/PLATELET
Abs Immature Granulocytes: 0.18 10*3/uL — ABNORMAL HIGH (ref 0.00–0.07)
Basophils Absolute: 0 10*3/uL (ref 0.0–0.1)
Basophils Relative: 0 %
Eosinophils Absolute: 0 10*3/uL (ref 0.0–0.5)
Eosinophils Relative: 0 %
HCT: 38.4 % (ref 36.0–46.0)
Hemoglobin: 12.3 g/dL (ref 12.0–15.0)
Immature Granulocytes: 2 %
Lymphocytes Relative: 12 %
Lymphs Abs: 1.1 10*3/uL (ref 0.7–4.0)
MCH: 31 pg (ref 26.0–34.0)
MCHC: 32 g/dL (ref 30.0–36.0)
MCV: 96.7 fL (ref 80.0–100.0)
Monocytes Absolute: 0.5 10*3/uL (ref 0.1–1.0)
Monocytes Relative: 6 %
Neutro Abs: 7.6 10*3/uL (ref 1.7–7.7)
Neutrophils Relative %: 80 %
Platelets: 251 10*3/uL (ref 150–400)
RBC: 3.97 MIL/uL (ref 3.87–5.11)
RDW: 15.3 % (ref 11.5–15.5)
WBC: 9.4 10*3/uL (ref 4.0–10.5)
nRBC: 0 % (ref 0.0–0.2)

## 2021-03-06 LAB — BASIC METABOLIC PANEL
Anion gap: 12 (ref 5–15)
BUN: 21 mg/dL (ref 8–23)
CO2: 22 mmol/L (ref 22–32)
Calcium: 8.5 mg/dL — ABNORMAL LOW (ref 8.9–10.3)
Chloride: 105 mmol/L (ref 98–111)
Creatinine, Ser: 0.84 mg/dL (ref 0.44–1.00)
GFR, Estimated: >60 mL/min (ref >60)
Glucose, Bld: 130 mg/dL — ABNORMAL HIGH (ref 70–99)
Potassium: 3.5 mmol/L (ref 3.5–5.1)
Sodium: 139 mmol/L (ref 135–145)

## 2021-03-06 LAB — GLUCOSE, CAPILLARY
Glucose-Capillary: 126 mg/dL — ABNORMAL HIGH (ref 70–99)
Glucose-Capillary: 111 mg/dL — ABNORMAL HIGH (ref 70–99)
Glucose-Capillary: 127 mg/dL — ABNORMAL HIGH (ref 70–99)
Glucose-Capillary: 101 mg/dL — ABNORMAL HIGH (ref 70–99)
Glucose-Capillary: 121 mg/dL — ABNORMAL HIGH (ref 70–99)
Glucose-Capillary: 117 mg/dL — ABNORMAL HIGH (ref 70–99)

## 2021-03-06 LAB — PHOSPHORUS: Phosphorus: 3.2 mg/dL (ref 2.5–4.6)

## 2021-03-06 LAB — URINE CULTURE

## 2021-03-06 LAB — MAGNESIUM: Magnesium: 1.8 mg/dL (ref 1.7–2.4)

## 2021-03-06 MED ORDER — TRAMADOL HCL 50 MG PO TABS
25.0000 mg | ORAL_TABLET | Freq: Four times a day (QID) | ORAL | Status: DC | PRN
  Administered 2021-03-08 – 2021-03-12 (×8): 25 mg via ORAL
  Filled 2021-03-06 (×8): qty 1

## 2021-03-06 MED ORDER — ROPINIROLE HCL 1 MG PO TABS
2.0000 mg | ORAL_TABLET | Freq: Every day | ORAL | Status: DC
  Filled 2021-03-06: qty 2

## 2021-03-06 MED ORDER — METHYLPREDNISOLONE 4 MG PO TABS
4.0000 mg | ORAL_TABLET | Freq: Two times a day (BID) | ORAL | Status: DC
  Administered 2021-03-06 – 2021-03-13 (×15): 4 mg via ORAL
  Filled 2021-03-06 (×16): qty 1

## 2021-03-06 MED ORDER — GABAPENTIN 100 MG PO CAPS
100.0000 mg | ORAL_CAPSULE | Freq: Two times a day (BID) | ORAL | Status: DC
  Administered 2021-03-06: 100 mg via ORAL
  Filled 2021-03-06: qty 1

## 2021-03-06 MED ORDER — ASPIRIN 81 MG PO CHEW
81.0000 mg | CHEWABLE_TABLET | Freq: Every day | ORAL | Status: DC
Start: 2021-03-06 — End: 2021-03-13
  Administered 2021-03-06 – 2021-03-13 (×8): 81 mg via ORAL
  Filled 2021-03-06 (×8): qty 1

## 2021-03-06 MED ORDER — SODIUM CHLORIDE 0.9 % IV SOLN: INTRAVENOUS | Status: DC

## 2021-03-06 MED ORDER — CARVEDILOL 12.5 MG PO TABS
12.5000 mg | ORAL_TABLET | Freq: Two times a day (BID) | ORAL | Status: DC
  Administered 2021-03-06 – 2021-03-13 (×14): 12.5 mg via ORAL
  Filled 2021-03-06 (×15): qty 1

## 2021-03-06 MED ORDER — ROPINIROLE HCL 1 MG PO TABS
2.0000 mg | ORAL_TABLET | Freq: Every day | ORAL | Status: DC
  Administered 2021-03-06 – 2021-03-12 (×7): 2 mg via ORAL
  Filled 2021-03-06 (×8): qty 2

## 2021-03-06 MED ORDER — GABAPENTIN 300 MG PO CAPS
300.0000 mg | ORAL_CAPSULE | Freq: Every day | ORAL | Status: DC
  Administered 2021-03-06: 300 mg via ORAL
  Filled 2021-03-06: qty 1

## 2021-03-06 MED ORDER — CARVEDILOL 12.5 MG PO TABS: 12.5000 mg | ORAL_TABLET | Freq: Two times a day (BID) | ORAL | 2 refills | Status: DC

## 2021-03-06 MED ORDER — PIOGLITAZONE HCL 30 MG PO TABS
30.0000 mg | ORAL_TABLET | Freq: Every day | ORAL | Status: DC
  Administered 2021-03-06 – 2021-03-09 (×4): 30 mg via ORAL
  Filled 2021-03-06 (×5): qty 1

## 2021-03-06 MED ORDER — PANTOPRAZOLE SODIUM 40 MG PO TBEC
40.0000 mg | DELAYED_RELEASE_TABLET | Freq: Every day | ORAL | Status: DC
  Administered 2021-03-06 – 2021-03-13 (×8): 40 mg via ORAL
  Filled 2021-03-06 (×9): qty 1

## 2021-03-06 MED ORDER — LIP MEDEX EX OINT
TOPICAL_OINTMENT | CUTANEOUS | Status: AC
  Filled 2021-03-06: qty 7

## 2021-03-06 MED ORDER — TRIAMTERENE-HCTZ 75-50 MG PO TABS
1.0000 | ORAL_TABLET | Freq: Every day | ORAL | Status: DC
  Filled 2021-03-06: qty 1

## 2021-03-06 MED ORDER — METHYLPREDNISOLONE 2 MG PO TABS
4.0000 mg | ORAL_TABLET | Freq: Two times a day (BID) | ORAL | Status: DC
  Filled 2021-03-06: qty 2

## 2021-03-06 MED ORDER — LIP MEDEX EX OINT: TOPICAL_OINTMENT | CUTANEOUS | Status: DC | PRN

## 2021-03-06 NOTE — Progress Notes (Addendum)
PROGRESS NOTE  EMILYANNE MCGOUGH  DOB: 03/08/1941  PCP: Hulan Fess, MD HUT:[id number]  DOA: 03/04/2021  LOS: 0 days   Chief Complaint  Patient presents with  . Altered Mental Status    Brief narrative: Sonya Goodwin is a 80 y.o. female with PMH significant for DM2, HTN, HLD, intolerant to statins, mild OSA, sarcoidosis, adrenal insufficiency, lower extremity venous insufficiency and ulcers  and multiple hospitalizations in the past for altered mental status, presumably secondary to polypharmacy. Patient was brought to the ED on 3/6 with complaint of worsening confusion, agitation, generalized weakness. In the ED, patient was afebrile and hemodynamically stable.  Patient was confused, agitated and hence given Haldol and also Geodon following which she remained sedated.   CT scan of head was unremarkable. Chest x-ray shows nonspecific changes. UA was unremarkable.   Blood cultures and urine cultures were sent.  Patient also noted to be tachycardic with a suspicion of A. fib with RVR.  Started on Cardizem and heparin drip. Labs showed mildly elevated AST, lactic acid elevated to 2.4, ammonia normal at 17, unremarkable CBC. She was hypoglycemic in the ED at 56 corrected with dextrose IV. Admitted to hospitalist service.  Subjective: Patient was seen and examined this morning. Pleasant elderly Caucasian female.  Propped up in bed.  Not in distress.  Hard of hearing.  Oriented to place and person.  Husband at bedside.  He is the primary caregiver.  He is concerned about her ability to be home.  PT eval pending at this time.  Assessment/Plan: Acute encephalopathy Dysphagia -Unclear etiology.  Unremarkable CT head.  Ammonia level normal. -EEG normal.  MRI pending. -Similar presentation in the past with unclear etiology.  Polypharmacy is still a concern although patient and her husband denies. -Patient's mental status is gradually improving.  She has intermittent episodes of  confusion.  Probably underlying evolving dementia with episodes of delirium. -Patient husband is concerned about her swallowing.  Her oral appetite and hydration is poor.  I would continue IV fluid with normal saline at 50 mill per hour.  Elevated lactic acid level -Probably due to dehydration.  No evidence of sepsis at this time. -Lactate level improved with hydration.  Recent Labs  Lab 03/04/21 2046 03/04/21 2342 03/05/21 0142 03/05/21 0455 03/05/21 0818 03/06/21 0242  WBC 8.5  --   --  6.8 8.9 9.4  LATICACIDVEN 2.4* 2.5* 1.9  --  1.6  --    Risk of polypharmacy -Patient is on multiple mood altering medications at home including Elavil 25 mg daily, Neurontin 100 mg 3 times daily and 300 mg at bedtime, Requip 2 mg at bedtime, tramadol 50 mg every 6 hours as needed and Benadryl as needed for itching. -Currently all of them are on hold.  Resume them gradually. -Needs to cut down on these medications.  I have mentioned to the patient and her husband about this concern.   Multifocal atrial tachycardia -In the ED, patient was tachycardic.  Initially suspected to have reviewed with RVR and was given Cardizem infusion and heparin infusion. -Cardiology consult appreciated.  Patient has multifocal atrial tachycardia with multiple PACs.  A. fib ruled out.  Cardizem drip and heparin drip was stopped. -Echo with normal EF 60 to 65%, mild LVH, grade 1 diastolic dysfunction. -Patient is tachycardic and hypertensive today again.  Per cardiology recommendation, I started the patient on carvedilol 12.5 mg twice daily.  Continue to monitor in telemetry.  Diabetes mellitus Hypoglycemia  -A1c 6.3 on June  2021. -On Actos 30 mg daily at home.  Continue the same.  Fingerstick readings remain in normal range so far. Recent Labs  Lab 03/05/21 1943 03/05/21 2347 03/06/21 0328 03/06/21 0732 03/06/21 1123  GLUCAP 133* 134* 126* 111* 127*   Essential hypertension -Home Meds include Triamterene 75 mg  daily, hydrochlorothiazide 50 mg daily, -Initially they were held because of AKI.  -Currently started on Coreg.  Diuretics remain on hold.  Vitamin B12 deficiency -B12 level remains elevated.  I do not see any supplement in her home list. Recent Labs    06/16/20 0421 06/17/20 1000 06/21/20 0424 03/04/21 2046 03/05/21 0455 03/05/21 0818 03/06/21 0242  MCV 97.4   < >  --    < > 97.9 98.0 96.7  VITAMINB12 153*  --  >7,500*  --  1,030*  --   --    < > = values in this interval not displayed.   Chronic bilateral lower extremity wound -Wound culture team consulted. -Patient has seen [facility] for venous insufficiency and ulcers on LE extremities. 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 She was last seen 02/13/21 and noted that venous stasis ulcers are improved with compression therapy.   Hyperlipidemia -intolerant to all statins and she did not want to take fish oil due to palpitations.   Sarcoidosis -On Medrol oral 4 mg twice daily  Mobility: Encourage ambulation.  PT eval obtained Code Status:   Code Status: Full Code  Nutritional status: Body mass index is 20.46 kg/m.     Diet Order            Diet - low sodium heart healthy           Diet Heart Room service appropriate? Yes; Fluid consistency: Thin  Diet effective now                 DVT prophylaxis: Lovenox subcu   Antimicrobials:  None Fluid: Normal saline at 75 mL/h Consultants: Cardiology Family Communication:  Discussed with husband at bedside  Status is: Observation  The patient will require care spanning > 2 midnights and should be moved to inpatient because: Needs further IV hydration, telemetry monitoring. Dispo: The patient is from: Home              Anticipated d/c is to: Pending PT eval, probably SNF candidate              Patient currently is not medically stable to d/c.   Difficult to place patient No  Infusions:   . sodium chloride      Scheduled Meds: . aspirin  81 mg Oral Daily  . carvedilol  12.5 mg Oral BID WC  . chlorhexidine  15 mL Mouth Rinse BID  . Chlorhexidine Gluconate Cloth  6 each Topical Q0600  . dorzolamide-timolol  1 drop Both Eyes BID  . enoxaparin (LOVENOX) injection  40 mg Subcutaneous Q24H  . methylPREDNISolone  4 mg Oral BID  . mupirocin cream   Topical Daily  . pantoprazole  40 mg Oral Daily  . pioglitazone  30 mg Oral Daily    Antimicrobials: Anti-infectives (From admission, onward)   None      PRN meds:    Objective: Vitals:   03/06/21 1200 03/06/21 1300  BP: (!) 180/91 (!) 174/79  Pulse: 90 94  Resp: 20 20  Temp: 98.3 F (36.8 C)   SpO2: 95% 95%    Intake/Output Summary (  Last 24 hours) at 03/06/2021 1530 Last data filed at 03/06/2021 1222 Gross per 24 hour  Intake 1570.34 ml  Output 650 ml  Net 920.34 ml   Filed Weights   03/04/21 1951 03/05/21 0600  Weight: 62.5 kg 57.5 kg   Weight change:  Body mass index is 20.46 kg/m.   Physical Exam: General exam: Pleasant, elderly Caucasian female. Not in physical distress Skin: No rashes, lesions or ulcers. HEENT: Atraumatic, normocephalic, no obvious bleeding Lungs: Clear to auscultation bilaterally. CVS: Regular rate and rhythm, no murmur GI/Abd soft, nontender, nondistended, bowel sound present CNS: Alert, awake, hard of hearing but oriented to place and person.  Psychiatry: Mood appropriate Extremities: No pedal edema, no calf tenderness  Data Review: I have personally reviewed the laboratory data and studies available.  Recent Labs  Lab 03/04/21 2046 03/05/21 0455 03/05/21 0818 03/06/21 0242  WBC 8.5 6.8 8.9 9.4  NEUTROABS 5.9  --  7.2 7.6  HGB 13.4 13.4 12.7 12.3  HCT 42.6 42.4 39.7 38.4  MCV 98.8 97.9 98.0 96.7  PLT 241 245 236 251   Recent Labs  Lab 03/04/21 2046 03/05/21 0818 03/06/21 0242  NA 140 141 139  K 3.9 3.4* 3.5  CL 102 104 105  CO2 24 26 22   GLUCOSE 90 117*  130*  BUN 24* 20 21  CREATININE 0.90 0.80 0.84  CALCIUM 9.7 9.3 8.5*  MG  --   --  1.8  PHOS  --   --  3.2    F/u labs ordered Unresulted Labs (From admission, onward)          Start     Ordered   03/06/21 8682  Basic metabolic panel  Daily,   R      03/05/21 0750   03/06/21 0500  CBC with Differential/Platelet  Daily,   R      03/05/21 0750   03/05/21 0551  Urine rapid drug screen (hosp performed)  ONCE - STAT,   STAT        03/05/21 0551          Signed, Terrilee Croak, MD [facility] 03/06/2021

## 2021-03-06 NOTE — Evaluation (Signed)
Physical Therapy Evaluation Patient Details Name: Sonya Goodwin MRN: [id number] DOB: 01/18/1941 Today's Date: 03/06/2021   History of Present Illness  Patient is 80 y.o. female with PMH significant for DM2, HTN, HLD, intolerant to statins, mild OSA, sarcoidosis, adrenal insufficiency, lower extremity venous insufficiency and ulcers  and multiple hospitalizations in the past for AMS. Patient presented 3/4 with c/o weakness, and AMS.  Clinical Impression  Pt is a 80y.o. female with above HPI. Per pt and family report, she is modified independent with preferred use of axillary crutches for mobility at baseline vs other assistive devices. Pt required MOD assist +2 for all transfers for safety and physical assistance with repeated cues and increased time for processing with commands for sequencing and safe hand placements throughout. Pt's HR reached up to 145bpm during mobility with recovery to 110's at EOS, RN aware. Pt lives with her husband at home and has daughter and son who live out of state. Recommend SNF placement at this time to ensure pt and family safety as pt requires increased need for assist with all mobility. Pt will benefit from skilled PT to increase independence and safety with mobility. Acute therapy to follow up during stay to progress functional mobility as able.      Follow Up Recommendations SNF    Equipment Recommendations  None recommended by PT (pt owns cructhes and wc)    Recommendations for Other Services OT consult     Precautions / Restrictions Precautions Precautions: Fall Restrictions Weight Bearing Restrictions: No      Mobility  Bed Mobility Overal bed mobility: Needs Assistance Bed Mobility: Supine to Sit     Supine to sit: HOB elevated;Mod assist;+2 for physical assistance;+2 for safety/equipment     General bed mobility comments: MOD assist + 2 at pelvis using chuck pad and for trunk to upright with cues for hand placement and sequencing.     Transfers Overall transfer level: Needs assistance Equipment used: 2 person hand held assist Transfers: Sit to/from [facility] Sit to Stand: Mod assist;+2 physical assistance;+2 safety/equipment Stand pivot transfers: +2 physical assistance;+2 safety/equipment;Mod assist       General transfer comment: MOD asssist +2 for stability and power up to stand (~x5) with cues for hand placement. Pt required MOD assist +2 for stand pivot to recliner for safety and stability with cues for sequencing of steps. Pt's HR reached 145bpm, recovered and was in 110's at EOS. Pt's 02 sat remained >90% on RA throughout session during mobility.  Ambulation/Gait                Stairs            Wheelchair Mobility    Modified Rankin (Stroke Patients Only)       Balance Overall balance assessment: Needs assistance Sitting-balance support: Feet supported;Single extremity supported Sitting balance-Leahy Scale: Fair     Standing balance support: Bilateral upper extremity supported;During functional activity Standing balance-Leahy Scale: Poor Standing balance comment: use of +2 assist to maintain standing balance during transfers and pericare.                             Pertinent Vitals/Pain Pain Assessment: Faces Faces Pain Scale: Hurts little more Pain Location: B heels, Rt knee, Lt elbow Pain Descriptors / Indicators: Sore;Tender;Discomfort Pain Intervention(s): Limited activity within patient's tolerance;Monitored during session;Repositioned    Home Living Family/patient expects to be discharged to:: (P) Private residence Living Arrangements: (P)  Spouse/significant other Available Help at Discharge: (P) Family Type of Home: House Home Access: Ramped entrance     Home Layout: One level Home Equipment: Wheelchair - manual;Crutches Additional Comments: pt's daughter and husband present and able to provide home living environment. Pt's lives with  her husband and daughter lives in [place].    Prior Function Level of Independence: Independent with assistive device(s)         Comments: pt has been using B axillary cructhes for moblity but has not been mobile since Saturday per pt's husband as she has been progressively weaker. pt has unwitnessed fall in the past week per pt and her family and was noted to have scabs on B knees.     Hand Dominance        Extremity/Trunk Assessment   Upper Extremity Assessment Upper Extremity Assessment: Generalized weakness    Lower Extremity Assessment Lower Extremity Assessment: Generalized weakness    Cervical / Trunk Assessment Cervical / Trunk Assessment: Kyphotic  Communication   Communication: HOH  Cognition Arousal/Alertness: Awake/alert Behavior During Therapy: WFL for tasks assessed/performed Overall Cognitive Status: Impaired/Different from baseline Area of Impairment: Following commands                       Following Commands: Follows one step commands with increased time       General Comments: Pt requiring increased time for processing and repeated cuing of commands during session. Pt's husband and daughter reporting pt is more confused compared to baseline. Pt was able to recognize and name her husband and daughter at start of session.      General Comments General comments (skin integrity, edema, etc.): Pt report she has never used RW for mobility. Per pt's daughter, pt prefers crutches vs. RW for mobility because she feels that she can take more pressure off of Bil knees for pain control more effectively when using cruthces.    Exercises     Assessment/Plan    PT Assessment Patient needs continued PT services  PT Problem List Decreased strength;Decreased balance;Decreased activity tolerance;Decreased mobility;Decreased knowledge of use of DME       PT Treatment Interventions DME instruction;Gait training;Functional mobility training;Therapeutic  activities;Therapeutic exercise;Balance training;Patient/family education    PT Goals (Current goals can be found in the Care Plan section)  Acute Rehab PT Goals Patient Stated Goal: none stated PT Goal Formulation: With patient/family Time For Goal Achievement: 03/20/21    Frequency Min 2X/week (May adjust pending progress)   Barriers to discharge        Co-evaluation               AM-PAC PT "6 Clicks" Mobility  Outcome Measure Help needed turning from your back to your side while in a flat bed without using bedrails?: A Little Help needed moving from lying on your back to sitting on the side of a flat bed without using bedrails?: A Lot Help needed moving to and from a bed to a chair (including a wheelchair)?: A Lot Help needed standing up from a chair using your arms (e.g., wheelchair or bedside chair)?: A Lot Help needed to walk in hospital room?: A Lot Help needed climbing 3-5 steps with a railing? : Total 6 Click Score: 12    End of Session Equipment Utilized During Treatment: Gait belt Activity Tolerance: Patient tolerated treatment well Patient left: in chair;with call bell/phone within reach;with family/visitor present Nurse Communication: Mobility status (pt's HR max of 145bpm during session) PT  Visit Diagnosis: Unsteadiness on feet (R26.81);Muscle weakness (generalized) (M62.81);History of falling (Z91.81);Difficulty in walking, not elsewhere classified (R26.2)    Time: 2890-2284 PT Time Calculation (min) (ACUTE ONLY): 53 min   Charges:              Elna Breslow, SPT  Acute rehab    Elna Breslow 03/06/2021, 3:29 PM

## 2021-03-06 NOTE — Care Management Obs Status (Signed)
[facility] NOTIFICATION   Patient Details  Name: LARONDA LISBY MRN: [id number] Date of Birth: 11-Oct-1941   Medicare Observation Status Notification Given:  Yes    Leeroy Cha, RN 03/06/2021, 2:29 PM

## 2021-03-06 NOTE — NC FL2 (Signed)
[place] LEVEL OF CARE SCREENING TOOL     IDENTIFICATION  Patient Name: Sonya Goodwin Birthdate: [birth year] Sex: female Admission Date (Current Location): 03/04/2021  [facility] [place] and [place] Number:  Herbalist and Address:  [facility],  [place] [address] [place]      Provider Number: [id number]  Attending Physician Name and Address:  Terrilee Croak, MD  Relative Name and Phone Number:       Current Level of Care: Hospital Recommended Level of Care: [place] Prior Approval Number:    Date Approved/Denied:   PASRR Number: [id number]  Discharge Plan: SNF    Current Diagnoses: Patient Active Problem List   Diagnosis Date Noted  . Tachycardia 03/05/2021  . Pressure injury of skin 03/05/2021  . Generalized weakness   . UTI (urinary tract infection) 06/15/2020  . Acute encephalopathy 06/15/2020  . Hypokalemia 06/15/2020  . Dyslipidemia 11/18/2014  . Sarcoidosis   . Adrenal insufficiency ([place])   . Chronic kidney disease, stage 3a ([place])   . Hypertension   . Type 2 diabetes mellitus with stage 3 chronic kidney disease (HCC)     Orientation RESPIRATION BLADDER Height & Weight     Self,Time,Situation,Place  Normal Continent Weight: 57.5 kg Height:  5\' 6"  (167.6 cm)  BEHAVIORAL SYMPTOMS/MOOD NEUROLOGICAL BOWEL NUTRITION STATUS      Continent Diet (regular)  AMBULATORY STATUS COMMUNICATION OF NEEDS Skin   Extensive Assist Verbally Normal                       Personal Care Assistance Level of Assistance  Bathing,Feeding,Dressing Bathing Assistance: Limited assistance Feeding assistance: Limited assistance Dressing Assistance: Limited assistance     Functional Limitations Info  Sight,Hearing,Speech Sight Info: Adequate Hearing Info: Adequate Speech Info: Adequate    SPECIAL CARE FACTORS FREQUENCY  PT (By licensed PT),OT (By licensed OT)     PT Frequency: 5x weekly OT Frequency: 5 x  weekly            Contractures Contractures Info: Not present    Additional Factors Info  Code Status Code Status Info: full             Current Medications (03/06/2021):  This is the current hospital active medication list Current Facility-Administered Medications  Medication Dose Route Frequency Provider Last Rate Last Admin  . 0.9 %  sodium chloride infusion   Intravenous Continuous Terrilee Croak, MD 50 mL/hr at 03/06/21 0652 Infusion Verify at 03/06/21 0652  . chlorhexidine (PERIDEX) 0.12 % solution 15 mL  15 mL Mouth Rinse BID Terrilee Croak, MD   15 mL at 03/05/21 2154  . Chlorhexidine Gluconate Cloth 2 % PADS 6 each  6 each Topical Q0600 Dahal, Binaya, MD      . dorzolamide-timolol (COSOPT) 22.3-6.8 MG/ML ophthalmic solution 1 drop  1 drop Both Eyes BID Terrilee Croak, MD   1 drop at 03/05/21 2154  . enoxaparin (LOVENOX) injection 40 mg  40 mg Subcutaneous Q24H Minda Ditto, RPH   40 mg at 03/05/21 2153  . hydrocortisone sodium succinate (SOLU-CORTEF) 100 MG injection 50 mg  50 mg Intravenous Q8H Rise Patience, MD   50 mg at 03/06/21 0532  . lip balm (CARMEX) ointment   Topical PRN Terrilee Croak, MD   Given at 03/06/21 [phone]  . mupirocin cream (BACTROBAN) 2 %   Topical Daily Terrilee Croak, MD   Given at 03/05/21 1729     Discharge Medications:  Please see discharge summary for a list of discharge medications.  Relevant Imaging Results:  Relevant Lab Results:   Additional Information [id number]  Leeroy Cha, RN

## 2021-03-06 NOTE — TOC Progression Note (Signed)
Transition of Care [facility]) - Progression Note    Patient Details  Name: Sonya Goodwin MRN: [id number] Date of Birth: 01/26/1941  Transition of [facility]) CM/SW Contact  Leeroy Cha, RN Phone Number: 03/06/2021, 11:32 AM  Clinical Narrative:    Request for snf placement, due to husband can not care for at home at the present level of mobilty.  Patient was from [place]. fl2 faxed out to area and surrounding area  Expected Discharge Plan: [place] Barriers to Discharge: Continued Medical Work up  Expected Discharge Plan and Services Expected Discharge Plan: [place]   Discharge Planning Services: CM Consult   Living arrangements for the past 2 months: Single Family Home Expected Discharge Date: 03/06/21                                     Social Determinants of Health (SDOH) Interventions    Readmission Risk Interventions No flowsheet data found.

## 2021-03-06 NOTE — Progress Notes (Signed)
Progress Note  Patient Name: Sonya Goodwin Date of Encounter: 03/06/2021  [facility] Cardiologist: Skeet Latch, MD   Subjective   Much more alert today.  Denies any chest pain or shortness of breath.  Denies palpitations.  Inpatient Medications    Scheduled Meds: . aspirin  81 mg Oral Daily  . chlorhexidine  15 mL Mouth Rinse BID  . Chlorhexidine Gluconate Cloth  6 each Topical Q0600  . dorzolamide-timolol  1 drop Both Eyes BID  . enoxaparin (LOVENOX) injection  40 mg Subcutaneous Q24H  . methylPREDNISolone  4 mg Oral BID  . mupirocin cream   Topical Daily  . pantoprazole  40 mg Oral Daily  . pioglitazone  30 mg Oral Daily  . triamterene-hydrochlorothiazide  1 tablet Oral Daily   Continuous Infusions:  PRN Meds: lip balm   Vital Signs    Vitals:   03/06/21 0800 03/06/21 0900 03/06/21 1000 03/06/21 1200  BP: (!) 162/86 (!) 152/83 (!) 163/77   Pulse: 86 95 (!) 143   Resp: 19 (!) 23 17   Temp: 98 F (36.7 C)   98.3 F (36.8 C)  TempSrc: Axillary   Axillary  SpO2: 96% 96% 96%   Weight:      Height:        Intake/Output Summary (Last 24 hours) at 03/06/2021 1219 Last data filed at 03/06/2021 3335 Gross per 24 hour  Intake 1428.36 ml  Output 650 ml  Net 778.36 ml   Last 3 Weights 03/05/2021 03/04/2021 02/13/2021  Weight (lbs) 126 lb 12.2 oz 137 lb 12.6 oz 138 lb  Weight (kg) 57.5 kg 62.5 kg 62.596 kg      Telemetry    Multifocal atrial tachycardia and wandering pacemaker.  - Personally Reviewed  ECG    n/a - Personally Reviewed  Physical Exam   VS:  BP (!) 163/77   Pulse (!) 143   Temp 98.3 F (36.8 C) (Axillary)   Resp 17   Ht 5\' 6"  (1.676 m)   Wt 57.5 kg   SpO2 96%   BMI 20.46 kg/m  , BMI Body mass index is 20.46 kg/m. GENERAL:  Chronically ill-appearing.  Confused.  Cushingoid face.  Proptosis. HEENT: Pupils equal round and reactive, fundi not visualized, oral mucosa unremarkable NECK:  No jugular venous distention, waveform within  normal limits, carotid upstroke brisk and symmetric, no bruits LUNGS:  Clear to auscultation bilaterally HEART:  Irregularly irregular.  PMI not displaced or sustained,S1 and S2 within normal limits, no S3, no S4, no clicks, no rubs, no murmurs ABD:  Round, positive bowel sounds normal in frequency in pitch, no bruits, no rebound, no guarding, no midline pulsatile mass, no hepatomegaly, no splenomegaly EXT:  2 plus pulses throughout, no edema, no cyanosis no clubbing SKIN: Multiple skin tears NEURO:  Cranial nerves II through XII grossly intact, motor grossly intact throughout PSYCH:  Cognitively intact, oriented to person place and time   Labs    High Sensitivity Troponin:   Recent Labs  Lab 03/05/21 0455 03/05/21 0646  TROPONINIHS 32* 33*      Chemistry Recent Labs  Lab 03/04/21 2046 03/05/21 0818 03/06/21 0242  NA 140 141 139  K 3.9 3.4* 3.5  CL 102 104 105  CO2 24 26 22   GLUCOSE 90 117* 130*  BUN 24* 20 21  CREATININE 0.90 0.80 0.84  CALCIUM 9.7 9.3 8.5*  PROT 6.4*  --   --   ALBUMIN 3.4*  --   --  AST 48*  --   --   ALT 36  --   --   ALKPHOS 39  --   --   BILITOT 0.9  --   --   GFRNONAA >60 >60 >60  ANIONGAP 14 11 12      Hematology Recent Labs  Lab 03/05/21 0455 03/05/21 0818 03/06/21 0242  WBC 6.8 8.9 9.4  RBC 4.33 4.05 3.97  HGB 13.4 12.7 12.3  HCT 42.4 39.7 38.4  MCV 97.9 98.0 96.7  MCH 30.9 31.4 31.0  MCHC 31.6 32.0 32.0  RDW 15.1 15.3 15.3  PLT 245 236 251    BNPNo results for input(s): BNP, PROBNP in the last 168 hours.   DDimer No results for input(s): DDIMER in the last 168 hours.   Radiology    CT Head Wo Contrast  Result Date: 03/05/2021 CLINICAL DATA:  Altered mental status EXAM: CT HEAD WITHOUT CONTRAST TECHNIQUE: Contiguous axial images were obtained from the base of the skull through the vertex without intravenous contrast. COMPARISON:  None. FINDINGS: Brain: No evidence of acute infarction, hemorrhage, hydrocephalus, extra-axial  collection or mass lesion/mass effect. Chronic atrophic and ischemic changes are noted. Vascular: No hyperdense vessel or unexpected calcification. Skull: Normal. Negative for fracture or focal lesion. Sinuses/Orbits: No acute finding. Other: None. IMPRESSION: Chronic atrophic and ischemic changes without acute abnormality. Electronically Signed   By: Inez Catalina M.D.   On: 03/05/2021 00:16   MR BRAIN WO CONTRAST  Result Date: 03/05/2021 CLINICAL DATA:  Altered mental status.  Memory loss. EXAM: MRI HEAD WITHOUT CONTRAST TECHNIQUE: Multiplanar, multiecho pulse sequences of the brain and surrounding structures were obtained without intravenous contrast. COMPARISON:  CT head March 04, 2021. FINDINGS: Brain: No acute infarction, hemorrhage, hydrocephalus, extra-axial collection or mass lesion. Moderate to advanced patchy T2/FLAIR hyperintensities within the white matter, most likely related to chronic microvascular ischemic disease. Moderate generalized cerebral volume loss, including mesial temporal volume loss with ex vacuo temporal horn dilation Vascular: Major arterial flow voids are maintained at the skull base. Skull and upper cervical spine: Normal marrow signal. Sinuses/Orbits: Fluid layering within the sphenoid sinuses. Other: Trace right mastoid effusion. IMPRESSION: 1. No evidence of acute intracranial abnormality. 2. Moderate generalized cerebral volume loss. There is mesial temporal volume loss with ex vacuo temporal horn dilation, which is nonspecific but can be seen with Alzheimer's disease in the correct clinical setting. 3. Moderate to advanced chronic microvascular ischemic disease. 4. Fluid layering in the sphenoid sinuses. Electronically Signed   By: Margaretha Sheffield MD   On: 03/05/2021 15:26   DG Chest Port 1 View  Result Date: 03/05/2021 CLINICAL DATA:  Altered mental status EXAM: PORTABLE CHEST 1 VIEW COMPARISON:  Radiographs 06/15/2020, CT 03/11/2008 FINDINGS: Low lung volumes and  atelectatic changes with vascular crowding. Chronic asymmetric elevation of the right hemidiaphragm is again noted. Some additional hazy and streaky opacities are present in both lungs. No pneumothorax or effusion. Cardiomediastinal contours are quite similar to prior counting for differences in technique. The aorta is calcified. No acute osseous or soft tissue abnormality. Degenerative changes are present in the imaged spine and shoulders. Telemetry leads overlie the chest. IMPRESSION: 1. Low lung volumes and atelectatic changes with vascular crowding. 2. Additional hazy and streaky opacities in both lungs may reflect additional atelectasis, though edema or infection not fully excluded in the appropriate clinical context. 3.  Aortic Atherosclerosis (ICD10-I70.0). Electronically Signed   By: Lovena Le M.D.   On: 03/05/2021 00:04   EEG adult  Result Date: 03/05/2021 Lora Havens, MD     03/05/2021  1:14 PM Patient Name: BOWIE DOIRON MRN: [id number] Epilepsy Attending: Lora Havens Referring Physician/Provider: Dr. Gean Birchwood Date: 03/05/2021 Duration: 23.59 minutes Patient history: 80 year old female with altered mental status. EEG to evaluate for seizures. Level of alertness: Lethargic AEDs during EEG study: None Technical aspects: This EEG study was done with scalp electrodes positioned according to the 10-20 International system of electrode placement. Electrical activity was acquired at a sampling rate of 500Hz  and reviewed with a high frequency filter of 70Hz  and a low frequency filter of 1Hz . EEG data were recorded continuously and digitally stored. Description: No clear posterior dominant rhythm was seen. EEG showed continuous generalized 5 to 7 Hz theta slowing as well as intermittent generalized 2 to 3 Hz delta slowing. Hyperventilation and photic stimulation were not performed.   ABNORMALITY -Continuous slow, generalized IMPRESSION: This study is suggestive of moderate diffuse  encephalopathy, nonspecific etiology. No seizures or epileptiform discharges were seen throughout the recording. Lora Havens   ECHOCARDIOGRAM COMPLETE  Result Date: 03/05/2021    ECHOCARDIOGRAM REPORT   Patient Name:   DAISSY YERIAN Date of Exam: 03/05/2021 Medical Rec #:  [id number]          Height:       66.0 in Accession #:    [id number]         Weight:       126.8 lb Date of Birth:  1941-07-24          BSA:          1.647 m Patient Age:    53 years           BP:           119/56 mmHg Patient Gender: F                  HR:           75 bpm. Exam Location:  Inpatient Procedure: 2D Echo Indications:    Atrial Fibrillation  History:        Patient has no prior history of Echocardiogram examinations.                 Risk Factors:Hypertension and Diabetes.  Sonographer:    Mikki Santee RDCS (AE) Referring Phys: [place]  1. Difficult study due to poor acoustic windows and limited visualization.  2. Left ventricular ejection fraction, by estimation, is 60 to 65%. The left ventricle has normal function. The left ventricle has no regional wall motion abnormalities. There is mild left ventricular hypertrophy with more notable, discrete thickening in the basal septal segment. Left ventricular diastolic parameters consistent with grade I diastolic function (impaired relaxation).  3. Right ventricular systolic function is normal. The right ventricular size is normal. Mildly increased right ventricular wall thickness.  4. There is moderate mitral valve thickening with moderate-to-severe mitral valve leaflet calcification. There is moderate mitral annular calcification. There is mild mitral stenosis with MVA 1.8cm2 by continuity with mean gradient 41mmHg at HR 95bpm.  5. The aortic valve was not well visualized. Aortic valve regurgitation is not visualized. Comparison(s): No prior Echocardiogram. FINDINGS  Left Ventricle: Difficult study due to poor acoustic windows and limited  visualization. Left ventricular ejection fraction, by estimation, is 60 to 65%. The left ventricle has normal function. The left ventricle has no regional wall motion abnormalities. The left ventricular internal cavity size was normal in size.  There is mild asymmetric left ventricular hypertrophy of the basal-septal segment. Left ventricular diastolic parameters are consistent with Grade I diastolic dysfunction (impaired relaxation). Right Ventricle: The right ventricular size is normal. Mildly increased right ventricular wall thickness. Right ventricular systolic function is normal. Left Atrium: Left atrial size was not well visualized. Right Atrium: Right atrial size was normal in size. Pericardium: There is no evidence of pericardial effusion. Presence of pericardial fat pad. Mitral Valve: The mitral valve is abnormal. There is moderate thickening of the mitral valve leaflet(s). There is moderate calcification of the mitral valve leaflet(s). Moderate mitral annular calcification. Trivial mitral valve regurgitation. Mild mitral valve stenosis. MV peak gradient, 12.8 mmHg. The mean mitral valve gradient is 5.0 mmHg. Tricuspid Valve: The tricuspid valve is normal in structure. Tricuspid valve regurgitation is trivial. Aortic Valve: The aortic valve was not well visualized. Aortic valve regurgitation is not visualized. Pulmonic Valve: The pulmonic valve was normal in structure. Pulmonic valve regurgitation is trivial. Aorta: The aortic root is normal in size and structure. IAS/Shunts: No atrial level shunt detected by color flow Doppler.  LEFT VENTRICLE PLAX 2D LVIDd:         3.10 cm LVIDs:         2.20 cm LV PW:         1.20 cm LV IVS:        1.10 cm LVOT diam:     1.90 cm LV SV:         74 LV SV Index:   45 LVOT Area:     2.84 cm  LEFT ATRIUM             Index       RIGHT ATRIUM          Index LA diam:        2.90 cm 1.76 cm/m  RA Area:     9.53 cm LA Vol (A2C):   37.4 ml 22.70 ml/m RA Volume:   19.10 ml 11.59  ml/m LA Vol (A4C):   44.6 ml 27.07 ml/m LA Biplane Vol: 43.0 ml 26.10 ml/m  AORTIC VALVE LVOT Vmax:   141.67 cm/s LVOT Vmean:  103.300 cm/s LVOT VTI:    0.259 m  AORTA Ao Root diam: 2.90 cm MITRAL VALVE MV Area (PHT): 2.71 cm     SHUNTS MV Area VTI:   1.98 cm     Systemic VTI:  0.26 m MV Peak grad:  12.8 mmHg    Systemic Diam: 1.90 cm MV Mean grad:  5.0 mmHg MV Vmax:       1.79 m/s MV Vmean:      104.0 cm/s MV Decel Time: 280 msec MV E velocity: 87.60 cm/s MV A velocity: 153.00 cm/s MV E/A ratio:  0.57 Gwyndolyn Kaufman MD Electronically signed by Gwyndolyn Kaufman MD Signature Date/Time: 03/05/2021/3:26:23 PM    Final     Cardiac Studies   Echo 03/05/21: 1. Difficult study due to poor acoustic windows and limited  visualization.  2. Left ventricular ejection fraction, by estimation, is 60 to 65%. The  left ventricle has normal function. The left ventricle has no regional  wall motion abnormalities. There is mild left ventricular hypertrophy with  more notable, discrete thickening  in the basal septal segment. Left ventricular diastolic parameters  consistent with grade I diastolic function (impaired relaxation).  3. Right ventricular systolic function is normal. The right ventricular  size is normal. Mildly increased right ventricular wall thickness.  4. There is moderate  mitral valve thickening with moderate-to-severe  mitral valve leaflet calcification. There is moderate mitral annular  calcification. There is mild mitral stenosis with MVA 1.8cm2 by continuity  with mean gradient 42mmHg at HR 95bpm.  5. The aortic valve was not well visualized. Aortic valve regurgitation  is not visualized.   Patient Profile     80 y.o. female with diabetes, CKD, adrenal insufficiency, and chronic venous stasis admitted with encephalopathy.  Assessment & Plan    # MAT:  # Wandering pacemaker:  Ms. Bahe continues to have an irregular heart rhythm consistent with multifocal atrial tachycardia  or wandering pacemaker.  She is asymptomatic.  Her blood pressures are also quite elevated.  We will start carvedilol 12.5 mg twice daily.  Thyroid function is normal.  Typically this is seen with people with underlying lung disease.  Fortunately, there is no association with stroke risk, though we do not have a good explanation for why her heart remains so irregular. \  # Hypertension:  Start carvedilol as above.  # Mild mitral stenosis:  Noted on echo.  She is evolemic.  Continue to monitor.    [facility] will sign off.   Medication Recommendations:  Adding carvedilol Other recommendations (labs, testing, etc):  none Follow up as an outpatient:  Cardiology f/u not necesary  For questions or updates, please contact [place] Please consult [email or web address] for contact info under        Signed, Skeet Latch, MD  03/06/2021, 12:19 PM

## 2021-03-06 NOTE — Progress Notes (Signed)
Chaplain engaged in an initial visit with Sonya Goodwin and her husband.  Sonya Goodwin shared that Monque's current state is very new to him.  Chaplain could assess that Sonya Goodwin is experiencing some stress around what is happening to his wife and what will happen.  He shared that he doesn't know if she will be coming home after discharge or need to go into a facility.  He also shared that he is dealing with arthritis which leaves him in pain.  Chaplain asked him about the support they have and he noted that they have two children.  His daughter is coming up today to offer him and her mother some support.  His son lives in a different state.  Chaplain offered ministries of presence, support, and listening.  Chaplain was able to get Sonya Goodwin something to drink and continued to let him know that the team is there to offer him support as he is there for Sonya Goodwin.   Chaplain will follow-up.    03/06/21 1200  Clinical Encounter Type  Visited With Patient and family together  Visit Type Initial

## 2021-03-07 DIAGNOSIS — R531 Weakness: Secondary | ICD-10-CM | POA: Diagnosis not present

## 2021-03-07 DIAGNOSIS — E1143 Type 2 diabetes mellitus with diabetic autonomic (poly)neuropathy: Secondary | ICD-10-CM | POA: Diagnosis present

## 2021-03-07 DIAGNOSIS — R4182 Altered mental status, unspecified: Secondary | ICD-10-CM | POA: Diagnosis not present

## 2021-03-07 DIAGNOSIS — D869 Sarcoidosis, unspecified: Secondary | ICD-10-CM | POA: Diagnosis present

## 2021-03-07 DIAGNOSIS — R269 Unspecified abnormalities of gait and mobility: Secondary | ICD-10-CM | POA: Diagnosis present

## 2021-03-07 DIAGNOSIS — E1151 Type 2 diabetes mellitus with diabetic peripheral angiopathy without gangrene: Secondary | ICD-10-CM | POA: Diagnosis present

## 2021-03-07 DIAGNOSIS — I872 Venous insufficiency (chronic) (peripheral): Secondary | ICD-10-CM | POA: Diagnosis present

## 2021-03-07 DIAGNOSIS — G9341 Metabolic encephalopathy: Secondary | ICD-10-CM | POA: Diagnosis not present

## 2021-03-07 DIAGNOSIS — M1710 Unilateral primary osteoarthritis, unspecified knee: Secondary | ICD-10-CM | POA: Diagnosis present

## 2021-03-07 DIAGNOSIS — E2740 Unspecified adrenocortical insufficiency: Secondary | ICD-10-CM | POA: Diagnosis not present

## 2021-03-07 DIAGNOSIS — Z7401 Bed confinement status: Secondary | ICD-10-CM | POA: Diagnosis not present

## 2021-03-07 DIAGNOSIS — H409 Unspecified glaucoma: Secondary | ICD-10-CM | POA: Diagnosis present

## 2021-03-07 DIAGNOSIS — I878 Other specified disorders of veins: Secondary | ICD-10-CM | POA: Diagnosis present

## 2021-03-07 DIAGNOSIS — Z20822 Contact with and (suspected) exposure to covid-19: Secondary | ICD-10-CM | POA: Diagnosis not present

## 2021-03-07 DIAGNOSIS — I129 Hypertensive chronic kidney disease with stage 1 through stage 4 chronic kidney disease, or unspecified chronic kidney disease: Secondary | ICD-10-CM | POA: Diagnosis not present

## 2021-03-07 DIAGNOSIS — E11649 Type 2 diabetes mellitus with hypoglycemia without coma: Secondary | ICD-10-CM | POA: Diagnosis present

## 2021-03-07 DIAGNOSIS — R443 Hallucinations, unspecified: Secondary | ICD-10-CM | POA: Diagnosis not present

## 2021-03-07 DIAGNOSIS — K2270 Barrett's esophagus without dysplasia: Secondary | ICD-10-CM | POA: Diagnosis present

## 2021-03-07 DIAGNOSIS — F05 Delirium due to known physiological condition: Secondary | ICD-10-CM | POA: Diagnosis present

## 2021-03-07 DIAGNOSIS — E7800 Pure hypercholesterolemia, unspecified: Secondary | ICD-10-CM | POA: Diagnosis present

## 2021-03-07 DIAGNOSIS — E1122 Type 2 diabetes mellitus with diabetic chronic kidney disease: Secondary | ICD-10-CM | POA: Diagnosis present

## 2021-03-07 DIAGNOSIS — R410 Disorientation, unspecified: Secondary | ICD-10-CM | POA: Diagnosis not present

## 2021-03-07 DIAGNOSIS — I4819 Other persistent atrial fibrillation: Secondary | ICD-10-CM | POA: Diagnosis not present

## 2021-03-07 DIAGNOSIS — R0902 Hypoxemia: Secondary | ICD-10-CM | POA: Diagnosis not present

## 2021-03-07 DIAGNOSIS — M2550 Pain in unspecified joint: Secondary | ICD-10-CM | POA: Diagnosis not present

## 2021-03-07 DIAGNOSIS — N183 Chronic kidney disease, stage 3 unspecified: Secondary | ICD-10-CM | POA: Diagnosis not present

## 2021-03-07 DIAGNOSIS — N1831 Chronic kidney disease, stage 3a: Secondary | ICD-10-CM | POA: Diagnosis not present

## 2021-03-07 DIAGNOSIS — Z743 Need for continuous supervision: Secondary | ICD-10-CM | POA: Diagnosis not present

## 2021-03-07 DIAGNOSIS — L89151 Pressure ulcer of sacral region, stage 1: Secondary | ICD-10-CM | POA: Diagnosis present

## 2021-03-07 DIAGNOSIS — M8580 Other specified disorders of bone density and structure, unspecified site: Secondary | ICD-10-CM | POA: Diagnosis present

## 2021-03-07 DIAGNOSIS — D860 Sarcoidosis of lung: Secondary | ICD-10-CM | POA: Diagnosis not present

## 2021-03-07 DIAGNOSIS — F0390 Unspecified dementia without behavioral disturbance: Secondary | ICD-10-CM | POA: Diagnosis present

## 2021-03-07 DIAGNOSIS — G9340 Encephalopathy, unspecified: Secondary | ICD-10-CM | POA: Diagnosis not present

## 2021-03-07 DIAGNOSIS — R69 Illness, unspecified: Secondary | ICD-10-CM | POA: Diagnosis not present

## 2021-03-07 DIAGNOSIS — I471 Supraventricular tachycardia: Secondary | ICD-10-CM | POA: Diagnosis not present

## 2021-03-07 LAB — CBC WITH DIFFERENTIAL/PLATELET
Abs Immature Granulocytes: 0.14 10*3/uL — ABNORMAL HIGH (ref 0.00–0.07)
Basophils Absolute: 0 10*3/uL (ref 0.0–0.1)
Basophils Relative: 0 %
Eosinophils Absolute: 0 10*3/uL (ref 0.0–0.5)
Eosinophils Relative: 0 %
HCT: 37.8 % (ref 36.0–46.0)
Hemoglobin: 11.9 g/dL — ABNORMAL LOW (ref 12.0–15.0)
Immature Granulocytes: 2 %
Lymphocytes Relative: 13 %
Lymphs Abs: 1.1 10*3/uL (ref 0.7–4.0)
MCH: 31 pg (ref 26.0–34.0)
MCHC: 31.5 g/dL (ref 30.0–36.0)
MCV: 98.4 fL (ref 80.0–100.0)
Monocytes Absolute: 0.7 10*3/uL (ref 0.1–1.0)
Monocytes Relative: 8 %
Neutro Abs: 6.8 10*3/uL (ref 1.7–7.7)
Neutrophils Relative %: 77 %
Platelets: 172 10*3/uL (ref 150–400)
RBC: 3.84 MIL/uL — ABNORMAL LOW (ref 3.87–5.11)
RDW: 15.4 % (ref 11.5–15.5)
WBC: 8.7 10*3/uL (ref 4.0–10.5)
nRBC: 0 % (ref 0.0–0.2)

## 2021-03-07 LAB — BASIC METABOLIC PANEL
Anion gap: 13 (ref 5–15)
BUN: 26 mg/dL — ABNORMAL HIGH (ref 8–23)
CO2: 23 mmol/L (ref 22–32)
Calcium: 8.3 mg/dL — ABNORMAL LOW (ref 8.9–10.3)
Chloride: 103 mmol/L (ref 98–111)
Creatinine, Ser: 0.84 mg/dL (ref 0.44–1.00)
GFR, Estimated: >60 mL/min (ref >60)
Glucose, Bld: 113 mg/dL — ABNORMAL HIGH (ref 70–99)
Potassium: 3.3 mmol/L — ABNORMAL LOW (ref 3.5–5.1)
Sodium: 139 mmol/L (ref 135–145)

## 2021-03-07 LAB — GLUCOSE, CAPILLARY
Glucose-Capillary: 94 mg/dL (ref 70–99)
Glucose-Capillary: 83 mg/dL (ref 70–99)
Glucose-Capillary: 72 mg/dL (ref 70–99)
Glucose-Capillary: 100 mg/dL — ABNORMAL HIGH (ref 70–99)
Glucose-Capillary: 165 mg/dL — ABNORMAL HIGH (ref 70–99)

## 2021-03-07 MED ORDER — POLYETHYLENE GLYCOL 3350 17 G PO PACK
17.0000 g | PACK | Freq: Every day | ORAL | Status: DC | PRN
  Administered 2021-03-12: 17 g via ORAL
  Filled 2021-03-07: qty 1

## 2021-03-07 MED ORDER — AMITRIPTYLINE HCL 25 MG PO TABS
25.0000 mg | ORAL_TABLET | Freq: Every day | ORAL | Status: DC
Start: 2021-03-07 — End: 2021-03-13
  Administered 2021-03-07 – 2021-03-12 (×6): 25 mg via ORAL
  Filled 2021-03-07 (×6): qty 1

## 2021-03-07 MED ORDER — HALOPERIDOL LACTATE 5 MG/ML IJ SOLN
INTRAMUSCULAR | Status: AC
  Filled 2021-03-07: qty 1

## 2021-03-07 MED ORDER — FOOD THICKENER (SIMPLYTHICK)
1.0000 | ORAL | Status: DC | PRN
  Filled 2021-03-07 (×4): qty 1

## 2021-03-07 MED ORDER — GLUCERNA SHAKE PO LIQD
237.0000 mL | Freq: Three times a day (TID) | ORAL | Status: DC
  Administered 2021-03-07 – 2021-03-08 (×2): 237 mL via ORAL
  Filled 2021-03-07 (×18): qty 237

## 2021-03-07 MED ORDER — HALOPERIDOL LACTATE 5 MG/ML IJ SOLN
2.0000 mg | Freq: Once | INTRAMUSCULAR | Status: AC
  Administered 2021-03-07: 2 mg via INTRAVENOUS

## 2021-03-07 MED ORDER — GABAPENTIN 250 MG/5ML PO SOLN
100.0000 mg | Freq: Two times a day (BID) | ORAL | Status: DC
  Administered 2021-03-07 – 2021-03-09 (×5): 100 mg via ORAL
  Filled 2021-03-07 (×6): qty 2

## 2021-03-07 MED ORDER — ADULT MULTIVITAMIN W/MINERALS CH
1.0000 | ORAL_TABLET | Freq: Every day | ORAL | Status: DC
  Administered 2021-03-08 – 2021-03-13 (×4): 1 via ORAL
  Filled 2021-03-07 (×6): qty 1

## 2021-03-07 MED ORDER — POTASSIUM CHLORIDE 20 MEQ PO PACK
40.0000 meq | PACK | Freq: Once | ORAL | Status: AC
  Administered 2021-03-07: 40 meq via ORAL
  Filled 2021-03-07: qty 2

## 2021-03-07 MED ORDER — SENNOSIDES-DOCUSATE SODIUM 8.6-50 MG PO TABS
1.0000 | ORAL_TABLET | Freq: Every day | ORAL | Status: DC
  Administered 2021-03-07 – 2021-03-12 (×4): 1 via ORAL
  Filled 2021-03-07 (×4): qty 1

## 2021-03-07 MED ORDER — GABAPENTIN 250 MG/5ML PO SOLN
300.0000 mg | Freq: Every day | ORAL | Status: DC
  Administered 2021-03-07 – 2021-03-10 (×4): 300 mg via ORAL
  Filled 2021-03-07 (×4): qty 6

## 2021-03-07 MED ORDER — POTASSIUM CHLORIDE CRYS ER 20 MEQ PO TBCR: 40.0000 meq | EXTENDED_RELEASE_TABLET | Freq: Once | ORAL | Status: DC

## 2021-03-07 MED ORDER — PROSOURCE PLUS PO LIQD
30.0000 mL | Freq: Two times a day (BID) | ORAL | Status: DC
  Administered 2021-03-07 – 2021-03-13 (×8): 30 mL via ORAL
  Filled 2021-03-07 (×8): qty 30

## 2021-03-07 NOTE — Evaluation (Addendum)
Clinical/Bedside Swallow Evaluation Patient Details  Name: Sonya Goodwin MRN: [id number] Date of Birth: 16-Dec-1941  Today's Date: 03/07/2021 Time: SLP Start Time (ACUTE ONLY): 0900 SLP Stop Time (ACUTE ONLY): 0945 SLP Time Calculation (min) (ACUTE ONLY): 45 min  Past Medical History:  Past Medical History:  Diagnosis Date  . Adrenal insufficiency ([place])   . Anemia   . Arthritis   . Barrett's esophagus   . Cancer ([place])    skin cancer, "spot on pancreas"  . CKD (chronic kidney disease)    Stage III as of 09/2013, kidney stones as well  . Deafness in right ear    wears hearing aids  . Diabetes mellitus without complication ([place])    Type II  . Diabetic gastroparesis ([place])   . Diabetic neuropathy ([place])   . Dysphagia   . GERD (gastroesophageal reflux disease)    takes Protonix  . Glaucoma   . High cholesterol   . History of cervical dysplasia   . History of kidney stones   . Hx: UTI (urinary tract infection)   . Hypertension   . Low back pain    chronic axial- Dr. Nelva Bush  . Osteoarthritis of knee    followed by ortho, Dr. Theda Sers  . Osteopenia    bone density T score-1.8 right femoral neck, 2010, -2.0 september 2012   . Peripheral arterial disease ([place])   . PONV (postoperative nausea and vomiting)   . Sarcoidosis   . Shortness of breath   . Sleep apnea    mild  . Spongiotic dermatitis    right breast, mild inflammation on biopsy  . Thyroid nodule    f/u ultrasound done by Dr. Cyndie Chime, [place] 10/10/09 were stable, smaller , followed since 2005, neg biopsy sev years ago, no further u/s needed per Dr. Cyndie Chime   Past Surgical History:  Past Surgical History:  Procedure Laterality Date  . BREAST BIOPSY Bilateral   . BREAST EXCISIONAL BIOPSY Right   . BRONCHOSCOPY  1984  . cataract surgery     both eyes  . CHOLECYSTECTOMY  1981  . COLONOSCOPY    . COLONOSCOPY WITH PROPOFOL N/A 12/19/2015   Procedure: COLONOSCOPY WITH PROPOFOL;  Surgeon: Garlan Fair, MD;  Location: [facility];  Service: Endoscopy;  Laterality: N/A;  . DILATION AND CURETTAGE OF UTERUS  1965  . ESOPHAGOGASTRODUODENOSCOPY (EGD) WITH ESOPHAGEAL DILATION    . KNEE ARTHROSCOPY  2011  . LUMBAR LAMINECTOMY/DECOMPRESSION MICRODISCECTOMY N/A 11/18/2013   Procedure: DECOMPRESSION L3 - L5  2 LEVELS;  Surgeon: Melina Schools, MD;  Location: [place];  Service: Orthopedics;  Laterality: N/A;  . ORIF DISTAL RADIUS FRACTURE  2010  . PARTIAL HYSTERECTOMY  1971   HPI:  80yo female admitted 03/04/21 with AMS and hallucinations, foul smelling urine x3 days. PMH: Barrett's esophagus, GERD, dysphagia, HTN, sarcoidosis, metabolic encephalopathy (9/67), adrenal insufficiency, DM2, CKD, venous insufficiency with LE wounds. Has required haldol for agitation. Deaf in right ear. CXR without infiltrate. MRI = no acute abnormality   Assessment / Plan / Recommendation Clinical Impression  Pt seen at bedside for evaluation of swallow function and safety, and to identify least restrictive diet. Pt's husband and daughter were present during this assessment. They report poor dentition with several teeth missing caps. This results in discomfort and residue in her teeth. Daughter also reports esophageal dilation in the past, but not for several years. Recently, pt PO intake has been minimal. Today, pt was cooperative but lethargic. She required ongoing cues to  open her eyes and accept po presentations. She is at risk for inadequate intake. Daughter voiced concern regarding pt's diabetes, and was informed that RD will be following pt as well.   Oral care was completed with suction. SLP provided education to family regarding the importance of oral care in decreasing bacteria in the oral cavity, which (if aspirated) increases risk of infection. Following oral care, pt accepted trials of ice chips, thin liquid via cup, nectar thick liquid via straw, puree, and solid textures. Immediate cough response noted  intermittently after thin liquid trials. Extended oral prep noted with graham cracker, and pt reported residue in her teeth after the swallow. Puree and liquid wash provided to facilitate oral clearing. Ice chips, nectar thick liquids, and puree trials were tolerated without overt s/s aspiration.   Will downgrade diet to puree and nectar thick liquid due to known oral and esophageal dysphagia, and suspected pharyngeal dysphagia. She has had minimal intake recently at home, and I am not sure she will meet her nutritional needs PO adequately at this time, due to weakness, lethargy, lack of appetite, and frequent refusal of PO offered.    SLP provided education on basic swallow function and rationale for recommended diet. SLP will follow to assess diet tolerance and need for instrumental study, as well as to continue education. Safe swallow and esophageal precautions and ice chip strategies were posted at [facility]. MD, RN, and RD informed.  SLP Visit Diagnosis: Dysphagia, unspecified (R13.10)    Aspiration Risk  Moderate aspiration risk;Risk for inadequate nutrition/hydration    Diet Recommendation Dysphagia 1 (Puree);Nectar-thick liquid   Liquid Administration via: Cup;Straw Medication Administration: Crushed with puree Supervision: Full supervision/cueing for compensatory strategies;Staff to assist with self feeding Compensations: Minimize environmental distractions;Slow rate;Small sips/bites;Follow solids with liquid;Other (Comment) (begin meals with warm nectar thick liquid) Postural Changes: Seated upright at 90 degrees;Remain upright for at least 30 minutes after po intake    Other  Recommendations Oral Care Recommendations: Oral care QID;Oral care prior to ice chip/H20 Other Recommendations: Have oral suction available;Remove water pitcher;Prohibited food (jello, ice cream, thin soups);Clarify dietary restrictions;Order thickener from pharmacy    Follow up Recommendations Other (comment) (TBD)       Frequency and Duration  1-2x/week for 1-2 weeks          Prognosis Prognosis for Safe Diet Advancement: Fair Barriers to Reach Goals: Severity of deficits      Swallow Study   General Date of Onset: 03/04/21 HPI: 80yo female admitted 03/04/21 with AMS and hallucinations, foul smelling urine x3 days. PMH: Barrett's esophagus, GERD, dysphagia, HTN, sarcoidosis, metabolic encephalopathy (5/46), adrenal insufficiency, DM2, CKD, venous insufficiency with LE wounds. Has required haldol for agitation. Deaf in right ear. CXR without infiltrate. MRI = no acute abnormality Type of Study: Bedside Swallow Evaluation Previous Swallow Assessment: none Diet Prior to this Study: Regular;Thin liquids Temperature Spikes Noted: No Respiratory Status: Room air History of Recent Intubation: No Behavior/Cognition: Cooperative;Lethargic/Drowsy;Requires cueing Oral Cavity Assessment: Dry Oral Care Completed by SLP: Yes Oral Cavity - Dentition: Poor condition Vision: Impaired for self-feeding Self-Feeding Abilities: Total assist Patient Positioning: Upright in bed Baseline Vocal Quality: Low vocal intensity Volitional Cough: Cognitively unable to elicit Volitional Swallow: Unable to elicit    Oral/Motor/Sensory Function Overall Oral Motor/Sensory Function: Generalized oral weakness   Ice Chips Ice chips: Within functional limits Presentation: Spoon   Thin Liquid Thin Liquid: Impaired Presentation: Cup Pharyngeal  Phase Impairments: Suspected delayed Swallow;Cough - Immediate    Nectar Thick  Nectar Thick Liquid: Within functional limits Presentation: Straw   Honey Thick Honey Thick Liquid: Not tested   Puree Puree: Within functional limits Presentation: Spoon   Solid     Solid: Impaired Oral Phase Impairments: Impaired mastication Oral Phase Functional Implications: Impaired mastication;Oral residue;Prolonged oral transit;Other (comment) (pt has lost caps on several teeth. Food gets stuck  and is painful)     Shiquita Collignon B. Quentin Ore, [facility], [place] Speech Language Pathologist Office: [phone] Pager: [phone]  Shonna Chock 03/07/2021,10:04 AM

## 2021-03-07 NOTE — Progress Notes (Signed)
Counseling Intern (CI) rounded on pts in ICU  CI talked to daughter who said that being in the ICU is a lot CI validated this process for daughter and then daughter wanted to take a break and take a walk  CI let the daughter know the herself and the team of chaplains are here for them, daughter thanked CI   CI can follow up if needed   [place] Intern @[employer]  (under [place])

## 2021-03-07 NOTE — Progress Notes (Signed)
Pt continues to be physically aggressive towards staff and continues to remove monitoring equipment and cause damage to her skin despite Haldol given. Bilateral wrist restraints applied to patient per order.

## 2021-03-07 NOTE — Progress Notes (Signed)
Initial Nutrition Assessment  RD working remotely.  DOCUMENTATION CODES:   Not applicable  INTERVENTION:  - will order Glucerna Shake TID, each supplement provides 220 kcal and 10 grams of protein. - will order Magic Cup with lunch meals, each supplement provides 290 kcal and 9 grams of protein. - will order 30 ml Prosource Plus BID, each supplement provides 100 kcal and 15 grams protein.  - will order 1 tablet multivitamin with minerals/day. - if PO intakes are inadequate, may need to consider small bore NGT placement with initiation of TF if patient remains Full Code.  - complete NFPE when feasible.   NUTRITION DIAGNOSIS:   Increased nutrient needs related to acute illness,wound healing as evidenced by estimated needs.  GOAL:   Patient will meet greater than or equal to 90% of their needs  MONITOR:   PO intake,Supplement acceptance,Diet advancement,Labs,Skin  REASON FOR ASSESSMENT:   Diagnosis  ASSESSMENT:   80 y.o. female with medical history of type 2 DM, HTN, HLD, intolerant to statins, mild OSA, sarcoidosis, adrenal insufficiency, lower extremity venous insufficiency and ulcers and multiple hospitalizations in the past for AMS, presumably secondary to polypharmacy. She presented to the ED on 3/6 due to worsening confusion, agitation, and generalized weakness. CT head was unremarkable.  Secure chat received from SLP this AM outlining concerns about patient's ability to adequately meet nutrition needs given restrictive diet and mentation (patient noted to be a/o to self only).   She is currently ordered a Dysphagia 1, nectar-thick diet. Will order oral nutrition supplements as outlined above.   She has not been seen by a [place] RD at any time in the past.   Weight on 3/7 was documented as both 127 lb and 138 lb. Weight on 02/13/21 was 137 lb and prior to that the most recently documented weight was on 06/20/20 when she also weighed 137 lb.   No information  documented in the edema section of flow sheet.   Per notes: - acute encephalopathy--MRI brain showed chronic moderate to severe ischemic changes; EEG normal - dysphagia - chronic BLE venous stasis wounds - sarcoidosis - from home with likely plan for SNF at time of d/c   Labs reviewed; CBGs: 94, 83, 72 mg/dl, K: 3.3 mmol/l, BUN: 26 mg/dl, Ca: 8.3 mg/dl. Medications reviewed; 40 mEq Klor-Con x1 dose 3/9. IVF; NS @ 50 ml/hr.    NUTRITION - FOCUSED PHYSICAL EXAM:  unable to complete at this time.  Diet Order:   Diet Order            DIET - DYS 1 Room service appropriate? No; Fluid consistency: Nectar Thick  Diet effective now           Diet - low sodium heart healthy                 EDUCATION NEEDS:      Skin:  Skin Assessment: Skin Integrity Issues: Skin Integrity Issues:: Stage I,Other (Comment) Stage I: sacrum Other: BLE venous stasis ulcers (WOC note 3/7)  Last BM:  PTA/unknown  Height:   Ht Readings from Last 1 Encounters:  03/05/21 5\' 6"  (1.676 m)    Weight:   Wt Readings from Last 1 Encounters:  03/05/21 57.5 kg     Estimated Nutritional Needs:  Kcal:  1500-1750 kcal Protein:  70-85 grams Fluid:  >/= 1.8 L/day      Jarome Matin, MS, RD, LDN, CNSC Inpatient Clinical Dietitian RD pager # available in [facility]  After hours/weekend pager #  available in [facility]

## 2021-03-07 NOTE — Progress Notes (Signed)
PROGRESS NOTE  Sonya Goodwin  DOB: 1941/04/04  PCP: Hulan Fess, MD ZOX:[id number]  DOA: 03/04/2021  LOS: 0 days   Chief Complaint  Patient presents with  . Altered Mental Status    Brief narrative: Sonya Goodwin is a 80 y.o. female with PMH significant for DM2, HTN, HLD, intolerant to statins, mild OSA, sarcoidosis, adrenal insufficiency, lower extremity venous insufficiency and ulcers  and multiple hospitalizations in the past for altered mental status, presumably secondary to polypharmacy. Patient was brought to the ED on 3/6 with complaint of worsening confusion, agitation, generalized weakness. In the ED, patient was afebrile and hemodynamically stable.  CT scan of head was unremarkable. Labs showed mildly elevated AST, lactic acid elevated to 2.4, ammonia normal at 17, unremarkable CBC. Admitted to hospitalist service. See below for details  Subjective: Patient was seen and examined this morning. Lying on bed.  Sedated this morning from the effect of Haldol Sagard[place] earlier.  Daughter Ms. [name] was at bedside. Patient slept about 5 hours last night and started to become agitated early this morning. She has poor oral intake, speech evaluation ongoing. Currently on IV fluid and dysphagia diet.  Assessment/Plan: Acute encephalopathy -Patient was admitted for altered mental status.  Multiple possible etiologies were considered.  There is no evidence of infection.  Patient was dehydrated as evidenced by elevated lactic acid level.  Patient has chronic moderate to severe ischemic changes in MRI brain.  EEG normal.  -She takes multiple mood altering medications at home.  Per daughter, until few days prior to presentation, patient was able to ambulate and have a conversation.   -Patient's mental status initially improved after IV hydration but she is having frequent periods of agitation, delirium now.  Her oral intake is compromised.  Her home medications have been gradually  resumed.  She is also requiring IV Haldol as needed.    Dysphagia -speech evaluation obtained. Currently on dysphagia 1 diet. Continue normal saline at 50 mill per hour because of poor oral intake. -NG feeding being considered.  Elevated lactic acid level -Secondary to dehydration.  Improved with IV fluid.  Risk of polypharmacy -Patient is on multiple mood altering medications at home including Elavil 25 mg daily, Neurontin 100 mg 3 times daily and 300 mg at bedtime, Requip 2 mg at bedtime, tramadol 50 mg every 6 hours as needed and Benadryl as needed for itching. -Initially all of the more held.  After discussion with family and based on patient's mental status, we have been gradually resuming them at lower doses.  I would still avoid Benadryl and limit the use of tramadol.  Multifocal atrial tachycardia -In the ED, patient was tachycardic.  Initially suspected to have reviewed with RVR and was given Cardizem infusion and heparin infusion. -Cardiology consult appreciated.  Patient has multifocal atrial tachycardia with multiple PACs.  A. fib ruled out.  Cardizem drip and heparin drip was stopped. -Echo with normal EF 60 to 65%, mild LVH, grade 1 diastolic dysfunction. -Patient is tachycardic and hypertensive today again.  Per cardiology recommendation, I started the patient on carvedilol 12.5 mg twice daily.  Continue to monitor in telemetry. -Heart rate and blood pressure stable this morning.  Diabetes mellitus Hypoglycemia  -A1c 6.3 on June 2021. -On Actos 30 mg daily at home.  Continue the same.  Fingerstick readings remain in normal range so far. Recent Labs  Lab 03/06/21 1603 03/06/21 1923 03/06/21 2326 03/07/21 0326 03/07/21 0717  GLUCAP 101* 121* 117* 94 83  Essential hypertension -Home Meds include Triamterene 75 mg daily, hydrochlorothiazide 50 mg daily, -Initially they were held because of AKI.  -Currently blood pressure controlled on Coreg.  Diuretics remain on  hold.  Vitamin B12 deficiency -B12 level remains elevated.  I do not see any supplement in her home list. Recent Labs    06/16/20 0421 06/17/20 1000 06/21/20 0424 03/04/21 2046 03/05/21 0455 03/05/21 0818 03/06/21 0242 03/07/21 0240  MCV 97.4   < >  --    < > 97.9   < > 96.7 98.4  VITAMINB12 153*  --  >7,500*  --  1,030*  --   --   --    < > = values in this interval not displayed.   Chronic bilateral lower extremity wound -Wound culture team consulted. -Patient has seen [facility] for venous insufficiency and ulcers on LE extremities. 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 She was last seen 02/13/21 and noted that venous stasis ulcers are improved with compression therapy.   Hyperlipidemia -intolerant to all statins and she did not want to take fish oil due to palpitations.   Sarcoidosis -On Medrol oral 4 mg twice daily  Mobility: Encourage ambulation.  PT eval obtained.  SNF recommended Code Status:   Code Status: Full Code  Nutritional status: Body mass index is 20.46 kg/m.     Diet Order            DIET - DYS 1 Room service appropriate? No; Fluid consistency: Nectar Thick  Diet effective now           Diet - low sodium heart healthy                 DVT prophylaxis: Lovenox subcu   Antimicrobials:  None Fluid: Normal saline at 50 mill per hour Consultants: Cardiology Family Communication:  Discussed with daughter at bedside  Status is: Inpatient  Remains inpatient appropriate because: Intermittent delirium, dysphagia,   Dispo: The patient is from: Home              Anticipated d/c is to: SNF              Patient currently is not medically stable to d/c.   Difficult to place patient No   Infusions:  . sodium chloride 50 mL/hr at 03/07/21 0422    Scheduled Meds: . amitriptyline  25 mg Oral QHS  . aspirin  81 mg Oral Daily  . carvedilol  12.5 mg Oral BID WC  .  chlorhexidine  15 mL Mouth Rinse BID  . Chlorhexidine Gluconate Cloth  6 each Topical Q0600  . dorzolamide-timolol  1 drop Both Eyes BID  . enoxaparin (LOVENOX) injection  40 mg Subcutaneous Q24H  . gabapentin  100 mg Oral BID   And  . gabapentin  300 mg Oral QHS  . methylPREDNISolone  4 mg Oral BID  . mupirocin cream   Topical Daily  . pantoprazole  40 mg Oral Daily  . pioglitazone  30 mg Oral Daily  . rOPINIRole  2 mg Oral QPC supper    Antimicrobials: Anti-infectives (From admission, onward)   None      PRN meds:    Objective: Vitals:   03/07/21 0719 03/07/21 0800  BP:  (!) 130/42  Pulse:  64  Resp:  15  Temp: 97.7 F (36.5 C)   SpO2:  91%    Intake/Output Summary (Last 24 hours) at  03/07/2021 1211 Last data filed at 03/07/2021 0422 Gross per 24 hour  Intake 574.45 ml  Output 100 ml  Net 474.45 ml   Filed Weights   03/04/21 1951 03/05/21 0600  Weight: 62.5 kg 57.5 kg   Weight change:  Body mass index is 20.46 kg/m.   Physical Exam: General exam: Pleasant, elderly Caucasian female. Not in physical distress.  Remains sedated this morning Skin: No rashes, lesions or ulcers. HEENT: Atraumatic, normocephalic, no obvious bleeding Lungs: Clear to auscultation bilaterally. CVS: Regular rate and rhythm, no murmur GI/Abd soft, nontender, nondistended, bowel sound present CNS: Tries to open eyes on verbal command.  Under the effect of alcohol this morning Psychiatry: Mood appropriate Extremities: No pedal edema, no calf tenderness  Data Review: I have personally reviewed the laboratory data and studies available.  Recent Labs  Lab 03/04/21 2046 03/05/21 0455 03/05/21 0818 03/06/21 0242 03/07/21 0240  WBC 8.5 6.8 8.9 9.4 8.7  NEUTROABS 5.9  --  7.2 7.6 6.8  HGB 13.4 13.4 12.7 12.3 11.9*  HCT 42.6 42.4 39.7 38.4 37.8  MCV 98.8 97.9 98.0 96.7 98.4  PLT 241 245 236 251 172   Recent Labs  Lab 03/04/21 2046 03/05/21 0818 03/06/21 0242 03/07/21 0240  NA  140 141 139 139  K 3.9 3.4* 3.5 3.3*  CL 102 104 105 103  CO2 24 26 22 23   GLUCOSE 90 117* 130* 113*  BUN 24* 20 21 26*  CREATININE 0.90 0.80 0.84 0.84  CALCIUM 9.7 9.3 8.5* 8.3*  MG  --   --  1.8  --   PHOS  --   --  3.2  --     F/u labs ordered Unresulted Labs (From admission, onward)          Start     Ordered   03/06/21 8786  Basic metabolic panel  Daily,   R      03/05/21 0750   03/06/21 0500  CBC with Differential/Platelet  Daily,   R      03/05/21 0750   03/05/21 0551  Urine rapid drug screen (hosp performed)  ONCE - STAT,   STAT        03/05/21 0551          Signed, Terrilee Croak, MD [facility] 03/07/2021

## 2021-03-07 NOTE — Evaluation (Signed)
Occupational Therapy Evaluation Patient Details Name: Sonya Goodwin MRN: [id number] DOB: September 11, 1941 Today's Date: 03/07/2021    History of Present Illness Patient is 80 y.o. female with PMH significant for DM2, HTN, HLD, intolerant to statins, mild OSA, sarcoidosis, adrenal insufficiency, lower extremity venous insufficiency and ulcers  and multiple hospitalizations in the past for AMS. Patient presented 3/4 with c/o weakness, and AMS.   Clinical Impression   Patient lives at home with spouse in a single level home with ramp entry. Per daughter patient is typically mod I with self care, except for bathing which patient's spouse is having increased difficulty assisting her at home. Patient uses crutches for ambulation due to use of walker putting too much pressure on her knees per daughter. Currently patient presents with decreased strength, activity tolerance, balance, safety awareness and cognition. Poor direction following especially when anxious during transfers due to fear of falling. Pt needing mod A x2 to complete stand pivot to/from bedside commode and total A for perianal care. Notified RN pt unable to pass stool completely. Recommend continued acute OT services to maximize patient safety and independence with self care in order to facilitate D/C to venue listed below.    Follow Up Recommendations  SNF    Equipment Recommendations  None recommended by OT       Precautions / Restrictions Precautions Precautions: Fall Restrictions Weight Bearing Restrictions: No      Mobility Bed Mobility Overal bed mobility: Needs Assistance Bed Mobility: Supine to Sit;Sit to Supine     Supine to sit: HOB elevated;Mod assist Sit to supine: Mod assist;+2 for physical assistance;+2 for safety/equipment   General bed mobility comments: patient able to mobilize LEs to EOB needs assist with trunk due to decreased strength. mod A to guide trunk and LEs back to bed    Transfers Overall  transfer level: Needs assistance Equipment used: 2 person hand held assist Transfers: Sit to/from [facility] Sit to Stand: Mod assist;+2 physical assistance;+2 safety/equipment Stand pivot transfers: Mod assist;+2 physical assistance;+2 safety/equipment       General transfer comment: please see toilet transfer in ADL section for details. pt fearful of falling and feet sliding out from under her    Balance Overall balance assessment: Needs assistance Sitting-balance support: Feet supported Sitting balance-Leahy Scale: Fair     Standing balance support: Bilateral upper extremity supported;During functional activity Standing balance-Leahy Scale: Poor Standing balance comment: use of +2 assist to maintain standing balance during transfers and pericare.                           ADL either performed or assessed with clinical judgement   ADL Overall ADL's : Needs assistance/impaired     Grooming: Moderate assistance;Bed level   Upper Body Bathing: Moderate assistance;Sitting;Bed level   Lower Body Bathing: Maximal assistance;Sitting/lateral leans;Bed level   Upper Body Dressing : Moderate assistance;Sitting;Bed level   Lower Body Dressing: Total assistance;Bed level Lower Body Dressing Details (indicate cue type and reason): to don socks Toilet Transfer: Moderate assistance;+2 for physical assistance;+2 for safety/equipment;Cueing for safety;Cueing for sequencing;Stand-pivot;BSC Toilet Transfer Details (indicate cue type and reason): patient fearful of falling feels her feet are sliding, not following cues well for hand placement to grab onto OTs arms when transferring from [facility] to bedside. poor standing balance, high fall risk Toileting- Clothing Manipulation and Hygiene: Total assistance;Sit to/from stand Toileting - Clothing Manipulation Details (indicate cue type and reason): patient's DTR states patient  is constipated, unable to fully pass stool while  on commode with RN made aware     Functional mobility during ADLs: Moderate assistance;+2 for physical assistance;+2 for safety/equipment;Cueing for safety;Cueing for sequencing General ADL Comments: patient requiring increased assistance with self care due to weakness, poor balance, decreased activity tolerance, safety awareness and cognitive status     Vision Baseline Vision/History: Wears glasses Wears Glasses: Reading only              Pertinent Vitals/Pain Pain Assessment: Faces Faces Pain Scale: Hurts even more Pain Location: buttock, B feet Pain Descriptors / Indicators: Sore;Tender;Discomfort;Grimacing Pain Intervention(s): Monitored during session     Hand Dominance Right   Extremity/Trunk Assessment Upper Extremity Assessment Upper Extremity Assessment: Generalized weakness   Lower Extremity Assessment Lower Extremity Assessment: Defer to PT evaluation   Cervical / Trunk Assessment Cervical / Trunk Assessment: Kyphotic   Communication Communication Communication: HOH   Cognition Arousal/Alertness: Awake/alert Behavior During Therapy: Anxious Overall Cognitive Status: Impaired/Different from baseline Area of Impairment: Following commands;Problem solving                       Following Commands: Follows one step commands with increased time;Follows one step commands inconsistently     Problem Solving: Slow processing;Requires verbal cues;Requires tactile cues;Difficulty sequencing General Comments: patient's DT present and reports pt is not at her baseline, can usually comprehend directions/conversation without much repetition              [place] expects to be discharged to:: Private residence Living Arrangements: Spouse/significant other Available Help at Discharge: Family Type of Home: House Home Access: [place]: One level     Bathroom Shower/Tub: Occupational psychologist:  [place]: Wheelchair - manual;Crutches;Other (comment);Shower seat - built in (toilet rails)          Prior Functioning/Environment Level of Independence: Independent with assistive device(s)        Comments: DTR reports that spouse has been having to assist patient with showering and it is getting more difficult        OT Problem List: Decreased strength;Decreased activity tolerance;Impaired balance (sitting and/or standing);Decreased cognition;Decreased safety awareness;Pain      OT Treatment/Interventions: Self-care/ADL training;Therapeutic exercise;DME and/or AE instruction;Therapeutic activities;Cognitive remediation/compensation;Patient/family education;Balance training    OT Goals(Current goals can be found in the care plan section) Acute Rehab OT Goals Patient Stated Goal: have bowel movement OT Goal Formulation: With patient/family Time For Goal Achievement: 03/21/21 Potential to Achieve Goals: Good  OT Frequency: Min 2X/week    AM-PAC OT "6 Clicks" Daily Activity     Outcome Measure Help from another person eating meals?: A Lot Help from another person taking care of personal grooming?: A Lot Help from another person toileting, which includes using toliet, bedpan, or urinal?: A Lot Help from another person bathing (including washing, rinsing, drying)?: A Lot Help from another person to put on and taking off regular upper body clothing?: A Lot Help from another person to put on and taking off regular lower body clothing?: A Lot 6 Click Score: 12   End of Session Nurse Communication: Mobility status;Other (comment) (unable to pass stool)  Activity Tolerance: Patient tolerated treatment well Patient left: in bed;with call bell/phone within reach;with family/visitor present  OT Visit Diagnosis: Unsteadiness on feet (R26.81);Other abnormalities of gait and mobility (R26.89);Muscle weakness (generalized) (M62.81);Pain;Other symptoms and signs  involving cognitive function  Pain - part of body: Ankle and joints of foot (bilateral)                Time: 7340-3709 OT Time Calculation (min): 38 min Charges:  OT General Charges $OT Visit: 1 Visit OT Evaluation $OT Eval Moderate Complexity: 1 Mod OT Treatments $Self Care/Home Management : 23-37 mins  Delbert Phenix OT OT pager: [place] 03/07/2021, 2:30 PM

## 2021-03-07 NOTE — Progress Notes (Addendum)
Pt woke up very confused, yelling and attempting to pull off all monitoring equipment. Pt also attempting to remove dressings over her skin tears and causing her skin tears to bleed. Pt unable to be reoriented. Pt becoming aggressive with staff members. Hospitalist notified and Haldol given per order.

## 2021-03-08 LAB — CBC WITH DIFFERENTIAL/PLATELET
Abs Immature Granulocytes: 0.1 10*3/uL — ABNORMAL HIGH (ref 0.00–0.07)
Basophils Absolute: 0 10*3/uL (ref 0.0–0.1)
Basophils Relative: 0 %
Eosinophils Absolute: 0 10*3/uL (ref 0.0–0.5)
Eosinophils Relative: 0 %
HCT: 35.5 % — ABNORMAL LOW (ref 36.0–46.0)
Hemoglobin: 10.8 g/dL — ABNORMAL LOW (ref 12.0–15.0)
Immature Granulocytes: 2 %
Lymphocytes Relative: 13 %
Lymphs Abs: 0.8 10*3/uL (ref 0.7–4.0)
MCH: 30.8 pg (ref 26.0–34.0)
MCHC: 30.4 g/dL (ref 30.0–36.0)
MCV: 101.1 fL — ABNORMAL HIGH (ref 80.0–100.0)
Monocytes Absolute: 0.6 10*3/uL (ref 0.1–1.0)
Monocytes Relative: 9 %
Neutro Abs: 5 10*3/uL (ref 1.7–7.7)
Neutrophils Relative %: 76 %
Platelets: 198 10*3/uL (ref 150–400)
RBC: 3.51 MIL/uL — ABNORMAL LOW (ref 3.87–5.11)
RDW: 15.3 % (ref 11.5–15.5)
WBC: 6.4 10*3/uL (ref 4.0–10.5)
nRBC: 0 % (ref 0.0–0.2)

## 2021-03-08 LAB — GLUCOSE, CAPILLARY
Glucose-Capillary: 112 mg/dL — ABNORMAL HIGH (ref 70–99)
Glucose-Capillary: 115 mg/dL — ABNORMAL HIGH (ref 70–99)
Glucose-Capillary: 126 mg/dL — ABNORMAL HIGH (ref 70–99)
Glucose-Capillary: 162 mg/dL — ABNORMAL HIGH (ref 70–99)
Glucose-Capillary: 170 mg/dL — ABNORMAL HIGH (ref 70–99)
Glucose-Capillary: 175 mg/dL — ABNORMAL HIGH (ref 70–99)
Glucose-Capillary: 206 mg/dL — ABNORMAL HIGH (ref 70–99)

## 2021-03-08 LAB — BASIC METABOLIC PANEL
Anion gap: 8 (ref 5–15)
BUN: 25 mg/dL — ABNORMAL HIGH (ref 8–23)
CO2: 23 mmol/L (ref 22–32)
Calcium: 8.2 mg/dL — ABNORMAL LOW (ref 8.9–10.3)
Chloride: 110 mmol/L (ref 98–111)
Creatinine, Ser: 0.71 mg/dL (ref 0.44–1.00)
GFR, Estimated: >60 mL/min (ref >60)
Glucose, Bld: 137 mg/dL — ABNORMAL HIGH (ref 70–99)
Potassium: 3.4 mmol/L — ABNORMAL LOW (ref 3.5–5.1)
Sodium: 141 mmol/L (ref 135–145)

## 2021-03-08 MED ORDER — POTASSIUM CHLORIDE 20 MEQ PO PACK
40.0000 meq | PACK | Freq: Once | ORAL | Status: AC
  Administered 2021-03-08: 40 meq via ORAL
  Filled 2021-03-08: qty 2

## 2021-03-08 NOTE — Plan of Care (Signed)

## 2021-03-08 NOTE — Progress Notes (Signed)
PROGRESS NOTE  Sonya Goodwin  DOB: May 14, 1941  PCP: Hulan Fess, MD VCB:[id number]  DOA: 03/04/2021  LOS: 1 day   Chief Complaint  Patient presents with  . Altered Mental Status    Brief narrative: Sonya Goodwin is a 80 y.o. female with PMH significant for DM2, HTN, HLD, intolerant to statins, mild OSA, sarcoidosis, adrenal insufficiency, lower extremity venous insufficiency and ulcers  and multiple hospitalizations in the past for altered mental status, presumably secondary to polypharmacy. Patient was brought to the ED on 3/6 with complaint of worsening confusion, agitation, generalized weakness. In the ED, patient was afebrile and hemodynamically stable.  CT scan of head was unremarkable. Labs showed mildly elevated AST, lactic acid elevated to 2.4, ammonia normal at 17, unremarkable CBC. Admitted to hospitalist service. See below for details  Subjective: Patient was seen and examined this morning. Patient sleepy.  RN and husband at bedside. Patient had a good sleep last night.  Did not receive any sedatives. Poor oral intake.  Remains on IV fluid. Labs this morning with potassium low at 3.4.  Assessment/Plan: Acute encephalopathy -Patient was admitted for altered mental status. Multiple possible etiologies were considered.  There is no evidence of infection.  Patient was dehydrated as evidenced by elevated lactic acid level.  Patient has chronic moderate to severe ischemic changes in MRI brain.  EEG normal.  -Patient's mental status initially improved after IV hydration but later she started having frequent periods of agitation and delirium.  Her oral intake is compromised.   -She takes multiple mood altering medications at home.  They were initially held but had to be gradually resumed later.  -Sleepy this morning but able to open eyes and answer questions.  Continue to monitor for mental status change.  Impaired mobility -Per daughter, until few days prior to  presentation, patient was able to ambulate and have a conversation.  -PT eval obtained.  SNF recommended.  Case management on board  Dysphagia -speech therapy and dietitian consult appreciated. Currently on dysphagia 1 diet. Continue normal saline at 50 mill per hour because of poor oral intake.  Elevated lactic acid level -Secondary to dehydration.  Improved with IV fluid.  Risk of polypharmacy -Patient is on multiple mood altering medications at home including Elavil 25 mg daily, Neurontin 100 mg 3 times daily and 300 mg at bedtime, Requip 2 mg at bedtime, tramadol 50 mg every 6 hours as needed and Benadryl as needed for itching. -Initially all of the more held.  After discussion with family and based on patient's mental status, we have been gradually resuming them at lower doses.  I would still avoid Benadryl and limit the use of tramadol.  Multifocal atrial tachycardia -In the ED, patient was tachycardic.  Initially suspected to have reviewed with RVR and was given Cardizem infusion and heparin infusion. -Cardiology consult appreciated.  Patient has multifocal atrial tachycardia with multiple PACs.  A. fib ruled out.  Cardizem drip and heparin drip was stopped. -Echo with normal EF 60 to 65%, mild LVH, grade 1 diastolic dysfunction. -Patient is tachycardic and hypertensive today again.  Per cardiology recommendation, I started the patient on carvedilol 12.5 mg twice daily.  Continue to monitor in telemetry. -Heart rate and blood pressure stable so far.  Diabetes mellitus Hypoglycemia  -A1c 6.3 on June 2021. -On Actos 30 mg daily at home.  Continue the same.  Fingerstick readings remain in normal range so far. Recent Labs  Lab 03/07/21 1640 03/07/21 1924 03/07/21 2357  03/08/21 0337 03/08/21 0731  GLUCAP 100* 165* 175* 126* 115*   Essential hypertension -Home Meds include Triamterene 75 mg daily, hydrochlorothiazide 50 mg daily, -Currently blood pressure controlled on Coreg.   Diuretics remain on hold.  Vitamin B12 deficiency -B12 level remains elevated.  I do not see any supplement in her home list. Recent Labs    06/16/20 0421 06/17/20 1000 06/21/20 0424 03/04/21 2046 03/05/21 0455 03/05/21 0818 03/07/21 0240 03/08/21 0253  MCV 97.4   < >  --    < > 97.9   < > 98.4 101.1*  VITAMINB12 153*  --  >7,500*  --  1,030*  --   --   --    < > = values in this interval not displayed.   Chronic bilateral lower extremity wound -Wound culture team consulted. -Patient has seen [facility] for venous insufficiency and ulcers on LE extremities. 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 She was last seen 02/13/21 and noted that venous stasis ulcers are improved with compression therapy.   Hyperlipidemia -intolerant to all statins and she did not want to take fish oil due to palpitations.   Sarcoidosis -Continue Medrol oral 4 mg twice daily  Mobility: Encourage ambulation.  PT eval obtained.  SNF recommended Code Status:   Code Status: Full Code  Nutritional status: Body mass index is 20.46 kg/m. Nutrition Problem: Increased nutrient needs Etiology: acute illness,wound healing Signs/Symptoms: estimated needs Diet Order            DIET - DYS 1 Room service appropriate? No; Fluid consistency: Nectar Thick  Diet effective now           Diet - low sodium heart healthy                 DVT prophylaxis: Lovenox subcu   Antimicrobials:  None Fluid: Normal saline at 50 mill per hour Consultants: Cardiology Family Communication:  Discussed with daughter at bedside  Status is: Inpatient  Remains inpatient appropriate because: Intermittent delirium, dysphagia,   Dispo: The patient is from: Home              Anticipated d/c is to: SNF              Patient currently is not medically stable to d/c.   Difficult to place patient No   Infusions:  . sodium chloride 50 mL/hr at  03/08/21 0425    Scheduled Meds: . (feeding supplement) PROSource Plus  30 mL Oral BID BM  . amitriptyline  25 mg Oral QHS  . aspirin  81 mg Oral Daily  . carvedilol  12.5 mg Oral BID WC  . chlorhexidine  15 mL Mouth Rinse BID  . Chlorhexidine Gluconate Cloth  6 each Topical Q0600  . dorzolamide-timolol  1 drop Both Eyes BID  . enoxaparin (LOVENOX) injection  40 mg Subcutaneous Q24H  . feeding supplement (GLUCERNA SHAKE)  237 mL Oral TID BM  . gabapentin  100 mg Oral BID   And  . gabapentin  300 mg Oral QHS  . methylPREDNISolone  4 mg Oral BID  . multivitamin with minerals  1 tablet Oral Daily  . mupirocin cream   Topical Daily  . pantoprazole  40 mg Oral Daily  . pioglitazone  30 mg Oral Daily  . potassium chloride  40 mEq Oral Once  . rOPINIRole  2 mg Oral QPC supper  . senna-docusate  1 tablet  Oral QHS    Antimicrobials: Anti-infectives (From admission, onward)   None      PRN meds:    Objective: Vitals:   03/08/21 0800 03/08/21 0900  BP: (!) 100/51 130/71  Pulse: 63 80  Resp: 15 16  Temp:    SpO2: 94% 96%    Intake/Output Summary (Last 24 hours) at 03/08/2021 0956 Last data filed at 03/08/2021 0160 Gross per 24 hour  Intake 1196.47 ml  Output 250 ml  Net 946.47 ml   Filed Weights   03/04/21 1951 03/05/21 0600  Weight: 62.5 kg 57.5 kg   Weight change:  Body mass index is 20.46 kg/m.   Physical Exam: General exam: Pleasant, elderly Caucasian female. Not in physical distress.  Remains sleepy this morning Skin: No rashes, lesions or ulcers. HEENT: Atraumatic, normocephalic, no obvious bleeding Lungs: Clear to auscultation bilaterally. CVS: Regular rate and rhythm, no murmur GI/Abd soft, nontender, nondistended, bowel sound present CNS: Tries to open eyes on verbal command. Remains sleepy this morning. Psychiatry: Mood appropriate Extremities: No pedal edema, no calf tenderness  Data Review: I have personally reviewed the laboratory data and studies  available.  Recent Labs  Lab 03/04/21 2046 03/05/21 0455 03/05/21 0818 03/06/21 0242 03/07/21 0240 03/08/21 0253  WBC 8.5 6.8 8.9 9.4 8.7 6.4  NEUTROABS 5.9  --  7.2 7.6 6.8 5.0  HGB 13.4 13.4 12.7 12.3 11.9* 10.8*  HCT 42.6 42.4 39.7 38.4 37.8 35.5*  MCV 98.8 97.9 98.0 96.7 98.4 101.1*  PLT 241 245 236 251 172 198   Recent Labs  Lab 03/04/21 2046 03/05/21 0818 03/06/21 0242 03/07/21 0240 03/08/21 0253  NA 140 141 139 139 141  K 3.9 3.4* 3.5 3.3* 3.4*  CL 102 104 105 103 110  CO2 24 26 22 23 23   GLUCOSE 90 117* 130* 113* 137*  BUN 24* 20 21 26* 25*  CREATININE 0.90 0.80 0.84 0.84 0.71  CALCIUM 9.7 9.3 8.5* 8.3* 8.2*  MG  --   --  1.8  --   --   PHOS  --   --  3.2  --   --     F/u labs ordered Unresulted Labs (From admission, onward)          Start     Ordered   03/05/21 0551  Urine rapid drug screen (hosp performed)  ONCE - STAT,   STAT        03/05/21 0551   Unscheduled  Basic metabolic panel  Daily,   R     Question:  Specimen collection method  Answer:  Lab=Lab collect   03/08/21 0956   Unscheduled  CBC with Differential/Platelet  Daily,   R     Question:  Specimen collection method  Answer:  Lab=Lab collect   03/08/21 0956          Signed, Terrilee Croak, MD [facility] 03/08/2021

## 2021-03-08 NOTE — Progress Notes (Signed)
  Speech Language Pathology Treatment: Dysphagia  Patient Details Name: Sonya Goodwin MRN: [id number] DOB: 1941-08-06 Today's Date: 03/08/2021 Time: 0865-7846 SLP Time Calculation (min) (ACUTE ONLY): 10 min  Assessment / Plan / Recommendation Clinical Impression  Pt seen at bedside for follow up after BSE completed yesterday. Daughter present. Pt is more alert today. Daughter reports the puree diet has been well tolerated. Trials of thin liquid were tolerated well, without overt s/s aspiration. Will advance liquids to thin, continue dys 1/puree for now. Safe swallow precautions updated at [facility].    HPI HPI: 80yo female admitted 03/04/21 with AMS and hallucinations, foul smelling urine x3 days. PMH: Barrett's esophagus, GERD, dysphagia, HTN, sarcoidosis, metabolic encephalopathy (9/62), adrenal insufficiency, DM2, CKD, venous insufficiency with LE wounds. Has required haldol for agitation. Deaf in right ear. CXR without infiltrate. MRI = no acute abnormality      SLP Plan  Continue with current plan of care       Recommendations  Diet recommendations: Dysphagia 1 (puree);Thin liquid Liquids provided via: Cup;Straw Medication Administration: Crushed with puree Supervision: Patient able to self feed;Staff to assist with self feeding;Full supervision/cueing for compensatory strategies Compensations: Minimize environmental distractions;Slow rate;Small sips/bites;Follow solids with liquid;Other (Comment) (begin meals with warm liquid) Postural Changes and/or Swallow Maneuvers: Seated upright 90 degrees;Upright 30-60 min after meal                Oral Care Recommendations: Oral care QID;Oral care prior to ice chip/H20 Follow up Recommendations: Other (comment) (tbd) SLP Visit Diagnosis: Dysphagia, unspecified (R13.10) Plan: Continue with current plan of care       Sonya Goodwin, [facility], [place] Speech Language Pathologist Office: [phone] Pager:  [phone]  Shonna Chock 03/08/2021, 3:27 PM

## 2021-03-09 LAB — BASIC METABOLIC PANEL
Anion gap: 9 (ref 5–15)
BUN: 25 mg/dL — ABNORMAL HIGH (ref 8–23)
CO2: 22 mmol/L (ref 22–32)
Calcium: 8.2 mg/dL — ABNORMAL LOW (ref 8.9–10.3)
Chloride: 112 mmol/L — ABNORMAL HIGH (ref 98–111)
Creatinine, Ser: 0.62 mg/dL (ref 0.44–1.00)
GFR, Estimated: >60 mL/min (ref >60)
Glucose, Bld: 136 mg/dL — ABNORMAL HIGH (ref 70–99)
Potassium: 3.6 mmol/L (ref 3.5–5.1)
Sodium: 143 mmol/L (ref 135–145)

## 2021-03-09 LAB — CBC WITH DIFFERENTIAL/PLATELET
Abs Immature Granulocytes: 0.17 10*3/uL — ABNORMAL HIGH (ref 0.00–0.07)
Basophils Absolute: 0 10*3/uL (ref 0.0–0.1)
Basophils Relative: 1 %
Eosinophils Absolute: 0 10*3/uL (ref 0.0–0.5)
Eosinophils Relative: 0 %
HCT: 36.2 % (ref 36.0–46.0)
Hemoglobin: 11 g/dL — ABNORMAL LOW (ref 12.0–15.0)
Immature Granulocytes: 3 %
Lymphocytes Relative: 16 %
Lymphs Abs: 0.9 10*3/uL (ref 0.7–4.0)
MCH: 31.1 pg (ref 26.0–34.0)
MCHC: 30.4 g/dL (ref 30.0–36.0)
MCV: 102.3 fL — ABNORMAL HIGH (ref 80.0–100.0)
Monocytes Absolute: 0.6 10*3/uL (ref 0.1–1.0)
Monocytes Relative: 11 %
Neutro Abs: 4.2 10*3/uL (ref 1.7–7.7)
Neutrophils Relative %: 69 %
Platelets: 196 10*3/uL (ref 150–400)
RBC: 3.54 MIL/uL — ABNORMAL LOW (ref 3.87–5.11)
RDW: 15.5 % (ref 11.5–15.5)
WBC: 6 10*3/uL (ref 4.0–10.5)
nRBC: 0 % (ref 0.0–0.2)

## 2021-03-09 LAB — GLUCOSE, CAPILLARY
Glucose-Capillary: 108 mg/dL — ABNORMAL HIGH (ref 70–99)
Glucose-Capillary: 108 mg/dL — ABNORMAL HIGH (ref 70–99)
Glucose-Capillary: 119 mg/dL — ABNORMAL HIGH (ref 70–99)
Glucose-Capillary: 121 mg/dL — ABNORMAL HIGH (ref 70–99)
Glucose-Capillary: 131 mg/dL — ABNORMAL HIGH (ref 70–99)
Glucose-Capillary: 165 mg/dL — ABNORMAL HIGH (ref 70–99)

## 2021-03-09 NOTE — Progress Notes (Addendum)
Physical Therapy Treatment Patient Details Name: Sonya Goodwin MRN: [id number] DOB: [birth year] Today's Date: 03/09/2021          PT Comments    Pt progressing toward acute PT goals and less anxious about mobility this session.  Pt required MOD assist +2 for supine to sit transfer. Pt required MOD assist +2 for stability and power up to stand and perform stand pivot to recliner. Pt's family discussed with therapist potential changes in discharge planning SNF vs. HHPT. Pt's family will discuss all options with case manager to determine desired discharge disposition.  Acute therapy to follow up during stay to progress functional mobility as able.        03/09/21 1400  PT Visit Information  Last PT Received On 03/09/21  Assistance Needed +2  History of Present Illness Patient is 80 y.o. female with PMH significant for DM2, HTN, HLD, intolerant to statins, mild OSA, sarcoidosis, adrenal insufficiency, lower extremity venous insufficiency and ulcers  and multiple hospitalizations in the past for AMS. Patient presented 3/4 with c/o weakness, and AMS.  Subjective Data  Subjective pt feeling pretty good today  Patient Stated Goal none stated  Precautions  Precautions Fall  Restrictions  Weight Bearing Restrictions No  Pain Assessment  Pain Assessment Faces  Faces Pain Scale 4  Pain Location B heels, Rt knee, buttock  Pain Descriptors / Indicators Sore;Tender;Discomfort  Pain Intervention(s) Limited activity within patient's tolerance;Monitored during session;Repositioned  Cognition  Arousal/Alertness Awake/alert  Behavior During Therapy WFL for tasks assessed/performed (pt less anxious about mobilty/fear of falling this session)  Overall Cognitive Status Impaired/Different from baseline  Area of Impairment Following commands  Following Commands Follows one step commands with increased time  Problem Solving Slow processing;Requires verbal cues;Requires tactile cues;Difficulty  sequencing  General Comments Pt requiring increased time for processing and repeated cuing of commands during session. Pt intermittently going off on tangent during conversations with therapist.  Bed Mobility  Overal bed mobility Needs Assistance  Bed Mobility Supine to Sit  Supine to sit HOB elevated;Mod assist;+2 for physical assistance;+2 for safety/equipment  General bed mobility comments MOD assist + 2 with assist for B LEs, at pelvis using chuck pad, and for trunk to upright with cues for hand placement and sequencing.  Transfers  Overall transfer level Needs assistance  Equipment used 2 person hand held assist  Transfers Sit to/from [employer] Transfers  Sit to Stand Mod assist;+2 physical assistance;+2 safety/equipment  Stand pivot transfers +2 physical assistance;+2 safety/equipment;Mod assist  General transfer comment x3 sit <>stand from EOB; MOD asssist +2 for stability and power up to stand with cues for hand placement. Pt was able to stand with +2 assist while therapist assited with pericare. Pt required MOD assist +2 for stand pivot to recliner for safety and stability with cues for sequencing. Manual facilitation required for Lt LE sliding/stepping to move to recliner. Pt's HR reached 107bpm with moblity recovering to 80's and 02 sat remained high 98-100% on RA.   Balance  Overall balance assessment Needs assistance  Sitting-balance support Feet supported;Single extremity supported  Sitting balance-Leahy Scale Fair  Sitting balance - Comments Pt required MIN assist from therapist to maintain upright/forward sitting balance. Pt demonstrated ability to maintain seated balance with cues for use of B UEs on bed to hold herself forward to prevent posterior leaning while EOB.  Standing balance support Bilateral upper extremity supported;During functional activity  Standing balance-Leahy Scale Poor  Standing balance comment use of +2 assist to maintain  standing balance during  transfers and pericare.  General Comments  General comments (skin integrity, edema, etc.) Pt's daughter and granddaughter had discusssion with therapist about family preferences/options for discharge SNF vs. home with home health services. Therapist informed pt's family that she would contact the social worker about having an in person conversation with pt's family. The family expresses concern about the recovery of pt's mental state and may prefer for pt to be in familar place at home vs SNF.  PT - End of Session  Equipment Utilized During Treatment Gait belt  Activity Tolerance Patient tolerated treatment well  Patient left in chair;with call bell/phone within reach;with family/visitor present  Nurse Communication Mobility status   PT - Assessment/Plan  PT Plan Current plan remains appropriate  PT Visit Diagnosis Unsteadiness on feet (R26.81);Muscle weakness (generalized) (M62.81);History of falling (Z91.81);Difficulty in walking, not elsewhere classified (R26.2)  PT Frequency (ACUTE ONLY) Min 2X/week (May adjust pending progress)  Recommendations for Other Services OT consult  Follow Up Recommendations SNF  PT equipment None recommended by PT (pt owns cructhes and wc)  AM-PAC PT "6 Clicks" Mobility Outcome Measure (Version 2)  Help needed turning from your back to your side while in a flat bed without using bedrails? 3  Help needed moving from lying on your back to sitting on the side of a flat bed without using bedrails? 2  Help needed moving to and from a bed to a chair (including a wheelchair)? 2  Help needed standing up from a chair using your arms (e.g., wheelchair or bedside chair)? 2  Help needed to walk in hospital room? 2  Help needed climbing 3-5 steps with a railing?  1  6 Click Score 12  Consider Recommendation of Discharge To: CIR/SNF/LTACH  PT Goal Progression  Progress towards PT goals Progressing toward goals  Acute Rehab PT Goals  PT Goal Formulation With  patient/family  Time For Goal Achievement 03/20/21  PT Time Calculation  PT Start Time (ACUTE ONLY) 1142  PT Stop Time (ACUTE ONLY) 1221  PT Time Calculation (min) (ACUTE ONLY) 39 min             03/09/21 1400  PT Time Calculation  PT Start Time (ACUTE ONLY) 1142  PT Stop Time (ACUTE ONLY) 1221  PT Time Calculation (min) (ACUTE ONLY) 39 min  PT General Charges  $$ ACUTE PT VISIT 1 Visit  PT Treatments  $Therapeutic Activity 38-52 mins              Lauren Youngblood, SPT  Acute rehab     Lauren Youngblood 03/09/2021, 6:02 PM    I agree with the following treatment note after reviewing documentation. This session was performed under the supervision of a licensed clinician.  Verner Mould, DPT Acute Rehabilitation Services Office [phone] Pager [phone]

## 2021-03-09 NOTE — Progress Notes (Signed)
Patient with increased agitation and confusion. States she wants to go to the 1st floor because God told her to do so. Actively trying to get out of bed to go, pulling at lines. Daughter with concern of what's causing the confusion. Updated daughter based upon MD notes and discussed medications patient is on with her and side effects and dosage. Encouraged daughter to go home for the night and get some rest. Despite patients increased agitation and confusion, she did take some of her medications. See MAR for details. Decreased stimulation in room and encouraged patient to rest. Bed alarm on. Charge nurse aware of situation. Will continue to monitor patient.

## 2021-03-09 NOTE — Progress Notes (Signed)
PROGRESS NOTE  Sonya Goodwin  DOB: 1941-12-21  PCP: Hulan Fess, MD VWU:[id number]  DOA: 03/04/2021  LOS: 2 days   Chief Complaint  Patient presents with  . Altered Mental Status    Brief narrative: Sonya Goodwin is a 80 y.o. female with PMH significant for DM2, HTN, HLD, intolerant to statins, mild OSA, sarcoidosis, adrenal insufficiency, lower extremity venous insufficiency and ulcers  and multiple hospitalizations in the past for altered mental status, presumably secondary to polypharmacy. Patient was brought to the ED on 3/6 with complaint of worsening confusion, agitation, generalized weakness. In the ED, patient was afebrile and hemodynamically stable.  CT scan of head was unremarkable. Labs showed mildly elevated AST, lactic acid elevated to 2.4, ammonia normal at 17, unremarkable CBC. Admitted to hospitalist service. See below for details  Subjective: Patient was seen and examined this morning. Awake this morning.  Daughter at bedside.  She thinks patient is eating better now.  Assessment/Plan: Acute encephalopathy -Patient was admitted for altered mental status. Multiple possible etiologies were considered.  There is no evidence of infection.  Patient was dehydrated as evidenced by elevated lactic acid level.  Patient has chronic moderate to severe ischemic changes in MRI brain.  EEG normal.  -Patient's mental status initially improved after IV hydration but later she started having frequent periods of agitation and delirium.  Her oral intake is compromised.   -She takes multiple mood altering medications at home.  They were initially held but had to be gradually resumed later.  -More awake this morning.  Slept well last night.  Continue to monitor mental status change.  Impaired mobility -Per daughter, until few days prior to presentation, patient was able to ambulate and have a conversation.  -PT eval obtained.  SNF recommended.  Case management on  board  Dysphagia -speech therapy and dietitian consult appreciated. Currently on dysphagia 1 diet.  Okay to stop IV fluid today.  Elevated lactic acid level -Secondary to dehydration.  Improved with IV fluid.  Risk of polypharmacy -Patient is on multiple mood altering medications at home including Elavil 25 mg daily, Neurontin 100 mg 3 times daily and 300 mg at bedtime, Requip 2 mg at bedtime, tramadol 50 mg every 6 hours as needed and Benadryl as needed for itching. -Initially all of the more held.  After discussion with family and based on patient's mental status, we have been gradually resuming them at lower doses.  I would still avoid Benadryl and limit the use of tramadol.  Multifocal atrial tachycardia -In the ED, patient was tachycardic.  Initially suspected to have reviewed with RVR and was given Cardizem infusion and heparin infusion. -Cardiology consult appreciated.  Patient has multifocal atrial tachycardia with multiple PACs.  A. fib ruled out.  Cardizem drip and heparin drip was stopped. -Echo with normal EF 60 to 65%, mild LVH, grade 1 diastolic dysfunction. -Started on Coreg.  Heart rate is better controlled with that.  Essential hypertension -Home Meds include Triamterene 75 mg daily, hydrochlorothiazide 50 mg daily, -Currently blood pressure controlled on Coreg.  Diuretics remain on hold.  Diabetes mellitus Hypoglycemia  -A1c 6.3 on June 2021. -On Actos 30 mg daily at home.  Continue the same.  Fingerstick readings remain in normal range so far. Recent Labs  Lab 03/08/21 1920 03/08/21 2308 03/09/21 0412 03/09/21 0740 03/09/21 1158  GLUCAP 162* 206* 131* 108* 119*   Vitamin B12 deficiency -B12 level remains elevated.  I do not see any supplement in her home  list. Recent Labs    06/16/20 0421 06/17/20 1000 06/21/20 0424 03/04/21 2046 03/05/21 0455 03/05/21 0818 03/08/21 0253 03/09/21 0248  MCV 97.4   < >  --    < > 97.9   < > 101.1* 102.3*  VITAMINB12  153*  --  >7,500*  --  1,030*  --   --   --    < > = values in this interval not displayed.   Chronic bilateral lower extremity wound -Wound culture consult following -Patient has seen [facility] for venous insufficiency and ulcers on LE extremities. 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 She was last seen 02/13/21 and noted that venous stasis ulcers are improved with compression therapy.   Hyperlipidemia -intolerant to all statins and she did not want to take fish oil due to palpitations.   Sarcoidosis -Continue Medrol oral 4 mg twice daily  Mobility: Encourage ambulation.  PT eval obtained. SNF recommended Code Status:   Code Status: Full Code  Nutritional status: Body mass index is 20.46 kg/m. Nutrition Problem: Increased nutrient needs Etiology: acute illness,wound healing Signs/Symptoms: estimated needs Diet Order            DIET - DYS 1 Room service appropriate? Yes; Fluid consistency: Thin  Diet effective now           Diet - low sodium heart healthy                 DVT prophylaxis: Lovenox subcu   Antimicrobials:  None Fluid: Okay to stop IV fluid Consultants: Cardiology Family Communication:  Discussed with daughter at bedside  Status is: Inpatient  Remains inpatient appropriate because: pending placement  Dispo: The patient is from: Home              Anticipated d/c is to: SNF              Patient currently is medically stable to d/c.   Difficult to place patient No   Infusions:  . sodium chloride 50 mL/hr at 03/09/21 1000    Scheduled Meds: . (feeding supplement) PROSource Plus  30 mL Oral BID BM  . amitriptyline  25 mg Oral QHS  . aspirin  81 mg Oral Daily  . carvedilol  12.5 mg Oral BID WC  . chlorhexidine  15 mL Mouth Rinse BID  . Chlorhexidine Gluconate Cloth  6 each Topical Q0600  . dorzolamide-timolol  1 drop Both Eyes BID  . enoxaparin (LOVENOX) injection  40  mg Subcutaneous Q24H  . feeding supplement (GLUCERNA SHAKE)  237 mL Oral TID BM  . gabapentin  100 mg Oral BID   And  . gabapentin  300 mg Oral QHS  . methylPREDNISolone  4 mg Oral BID  . multivitamin with minerals  1 tablet Oral Daily  . mupirocin cream   Topical Daily  . pantoprazole  40 mg Oral Daily  . pioglitazone  30 mg Oral Daily  . rOPINIRole  2 mg Oral QPC supper  . senna-docusate  1 tablet Oral QHS    Antimicrobials: Anti-infectives (From admission, onward)   None      PRN meds:    Objective: Vitals:   03/09/21 0800 03/09/21 1200  BP:    Pulse:    Resp:    Temp: 98 F (36.7 C) (!) 97.3 F (36.3 C)  SpO2:      Intake/Output Summary (Last 24 hours) at 03/09/2021 1600 Last data  filed at 03/09/2021 1000 Gross per 24 hour  Intake 1477.38 ml  Output 400 ml  Net 1077.38 ml   Filed Weights   03/04/21 1951 03/05/21 0600  Weight: 62.5 kg 57.5 kg   Weight change:  Body mass index is 20.46 kg/m.   Physical Exam: General exam: Pleasant, elderly Caucasian female. Not in physical distress.  Awake this morning.  Tired.   Skin: No rashes, lesions or ulcers. HEENT: Atraumatic, normocephalic, no obvious bleeding Lungs: Clear to auscultation bilaterally. CVS: Regular rate and rhythm, no murmur GI/Abd soft, nontender, nondistended, bowel sound present CNS: Alert, awake, hard of hearing, knows he is in the hospital. Psychiatry: Mood appropriate Extremities: No pedal edema, no calf tenderness.  Chronic wounds, scabs.  Data Review: I have personally reviewed the laboratory data and studies available.  Recent Labs  Lab 03/05/21 0818 03/06/21 0242 03/07/21 0240 03/08/21 0253 03/09/21 0248  WBC 8.9 9.4 8.7 6.4 6.0  NEUTROABS 7.2 7.6 6.8 5.0 4.2  HGB 12.7 12.3 11.9* 10.8* 11.0*  HCT 39.7 38.4 37.8 35.5* 36.2  MCV 98.0 96.7 98.4 101.1* 102.3*  PLT 236 251 172 198 196   Recent Labs  Lab 03/05/21 0818 03/06/21 0242 03/07/21 0240 03/08/21 0253 03/09/21 0248   NA 141 139 139 141 143  K 3.4* 3.5 3.3* 3.4* 3.6  CL 104 105 103 110 112*  CO2 26 22 23 23 22   GLUCOSE 117* 130* 113* 137* 136*  BUN 20 21 26* 25* 25*  CREATININE 0.80 0.84 0.84 0.71 0.62  CALCIUM 9.3 8.5* 8.3* 8.2* 8.2*  MG  --  1.8  --   --   --   PHOS  --  3.2  --   --   --     F/u labs ordered Unresulted Labs (From admission, onward)          Start     Ordered   03/09/21 0071  Basic metabolic panel  Daily,   R     Question:  Specimen collection method  Answer:  Lab=Lab collect   03/08/21 0956   03/09/21 0500  CBC with Differential/Platelet  Daily,   R     Question:  Specimen collection method  Answer:  Lab=Lab collect   03/08/21 0956   03/05/21 0551  Urine rapid drug screen (hosp performed)  ONCE - STAT,   STAT        03/05/21 0551          Signed, Terrilee Croak, MD [facility] 03/09/2021

## 2021-03-09 NOTE — TOC Progression Note (Signed)
Transition of Care [facility]) - Progression Note    Patient Details  Name: Sonya Goodwin MRN: [id number] Date of Birth: Mar 03, 1941  [facility]) CM/SW Contact  Leeroy Cha, RN Phone Number: 03/09/2021, 1:40 PM  Clinical Narrative:    Spoke with two family members and gave the anmes of the the three accepting snf. [place], [facility]. Locations and phone numbers also given.  Did discuss the difference between snf and hhc.  That [facility] will not cover 24 hours around the clock care. Family will want to meet with the weekend case manager. Sonya Goodwin name given for contact.   Expected Discharge Plan: [place] Barriers to Discharge: Continued Medical Work up  Expected Discharge Plan and Services Expected Discharge Plan: [place]   Discharge Planning Services: CM Consult   Living arrangements for the past 2 months: Single Family Home Expected Discharge Date: 03/06/21                                     Social Determinants of Health (SDOH) Interventions    Readmission Risk Interventions No flowsheet data found.

## 2021-03-10 ENCOUNTER — Other Ambulatory Visit: Payer: Self-pay

## 2021-03-10 LAB — CBC WITH DIFFERENTIAL/PLATELET
Abs Immature Granulocytes: 0.16 10*3/uL — ABNORMAL HIGH (ref 0.00–0.07)
Basophils Absolute: 0 10*3/uL (ref 0.0–0.1)
Basophils Relative: 1 %
Eosinophils Absolute: 0 10*3/uL (ref 0.0–0.5)
Eosinophils Relative: 0 %
HCT: 39.6 % (ref 36.0–46.0)
Hemoglobin: 12.1 g/dL (ref 12.0–15.0)
Immature Granulocytes: 2 %
Lymphocytes Relative: 16 %
Lymphs Abs: 1.4 10*3/uL (ref 0.7–4.0)
MCH: 31.1 pg (ref 26.0–34.0)
MCHC: 30.6 g/dL (ref 30.0–36.0)
MCV: 101.8 fL — ABNORMAL HIGH (ref 80.0–100.0)
Monocytes Absolute: 0.9 10*3/uL (ref 0.1–1.0)
Monocytes Relative: 10 %
Neutro Abs: 6.2 10*3/uL (ref 1.7–7.7)
Neutrophils Relative %: 71 %
Platelets: 226 10*3/uL (ref 150–400)
RBC: 3.89 MIL/uL (ref 3.87–5.11)
RDW: 15.5 % (ref 11.5–15.5)
WBC: 8.6 10*3/uL (ref 4.0–10.5)
nRBC: 0 % (ref 0.0–0.2)

## 2021-03-10 LAB — BASIC METABOLIC PANEL
Anion gap: 9 (ref 5–15)
BUN: 22 mg/dL (ref 8–23)
CO2: 25 mmol/L (ref 22–32)
Calcium: 8.4 mg/dL — ABNORMAL LOW (ref 8.9–10.3)
Chloride: 107 mmol/L (ref 98–111)
Creatinine, Ser: 0.56 mg/dL (ref 0.44–1.00)
GFR, Estimated: >60 mL/min (ref >60)
Glucose, Bld: 128 mg/dL — ABNORMAL HIGH (ref 70–99)
Potassium: 3.6 mmol/L (ref 3.5–5.1)
Sodium: 141 mmol/L (ref 135–145)

## 2021-03-10 LAB — GLUCOSE, CAPILLARY
Glucose-Capillary: 125 mg/dL — ABNORMAL HIGH (ref 70–99)
Glucose-Capillary: 92 mg/dL (ref 70–99)
Glucose-Capillary: 135 mg/dL — ABNORMAL HIGH (ref 70–99)
Glucose-Capillary: 129 mg/dL — ABNORMAL HIGH (ref 70–99)
Glucose-Capillary: 130 mg/dL — ABNORMAL HIGH (ref 70–99)

## 2021-03-10 LAB — CULTURE, BLOOD (ROUTINE X 2)
Culture: NO GROWTH
Culture: NO GROWTH
Special Requests: ADEQUATE
Special Requests: ADEQUATE

## 2021-03-10 MED ORDER — HALOPERIDOL 1 MG PO TABS
1.0000 mg | ORAL_TABLET | Freq: Four times a day (QID) | ORAL | Status: DC | PRN
  Filled 2021-03-10: qty 1

## 2021-03-10 MED ORDER — QUETIAPINE FUMARATE 25 MG PO TABS
25.0000 mg | ORAL_TABLET | Freq: Every day | ORAL | Status: DC
  Administered 2021-03-10 – 2021-03-12 (×3): 25 mg via ORAL
  Filled 2021-03-10 (×3): qty 1

## 2021-03-10 MED ORDER — HALOPERIDOL 5 MG PO TABS: 5.0000 mg | ORAL_TABLET | Freq: Four times a day (QID) | ORAL | Status: DC | PRN

## 2021-03-10 MED ORDER — ONDANSETRON HCL 4 MG/2ML IJ SOLN
4.0000 mg | Freq: Three times a day (TID) | INTRAMUSCULAR | Status: DC | PRN
  Filled 2021-03-10: qty 2

## 2021-03-10 MED ORDER — HALOPERIDOL LACTATE 5 MG/ML IJ SOLN
5.0000 mg | Freq: Three times a day (TID) | INTRAMUSCULAR | Status: DC | PRN
  Filled 2021-03-10 (×2): qty 1

## 2021-03-10 MED ORDER — ONDANSETRON HCL 4 MG PO TABS
4.0000 mg | ORAL_TABLET | Freq: Three times a day (TID) | ORAL | Status: DC | PRN
  Administered 2021-03-10: 4 mg via ORAL
  Filled 2021-03-10: qty 1

## 2021-03-10 NOTE — Progress Notes (Signed)
PROGRESS NOTE  Sonya Goodwin  DOB: 09/14/1941  PCP: Hulan Fess, MD MPN:[id number]  DOA: 03/04/2021  LOS: 3 days   Chief Complaint  Patient presents with  . Altered Mental Status    Brief narrative: Sonya Goodwin is a 80 y.o. female with PMH significant for DM2, HTN, HLD, intolerant to statins, mild OSA, sarcoidosis, adrenal insufficiency, lower extremity venous insufficiency and ulcers  and multiple hospitalizations in the past for altered mental status, presumably secondary to polypharmacy. Patient was brought to the ED on 3/6 with complaint of worsening confusion, agitation, generalized weakness. In the ED, patient was afebrile and hemodynamically stable.  CT scan of head was unremarkable. Labs showed mildly elevated AST, lactic acid elevated to 2.4, ammonia normal at 17, unremarkable CBC. Admitted to hospitalist service. See below for details  Subjective: Patient was seen and examined this morning. Was agitated last night.  Slept later last night.  Awake this morning.  Daughter at bedside.   Assessment/Plan: Acute encephalopathy Risk of polypharmacy -Patient was admitted for altered mental status. Multiple possible etiologies were considered.  There was no evidence of infection.  Patient was dehydrated as evidenced by elevated lactic acid level.  Patient has chronic moderate to severe ischemic changes in MRI brain.  EEG normal.  -Patient's mental status initially improved after IV hydration but later she started having frequent periods of agitation and delirium.  Her oral intake is compromised.   -She takes multiple mood altering medications at home.  They were initially held but had to be gradually resumed later.  -Her mental status has been waxing and waning in the hospital mostly seen during the day and with agitation and sundowning in the night.   -Currently on Neurontin 300 mg at bedtime, Requip 2 mg at bedtime, Elavil 25 mg at bedtime and tramadol as needed. We  will add Seroquel 25 mg for tonight. Continue monitor mental status change.  Impaired mobility -Per daughter, until few days prior to presentation, patient was able to ambulate and have a conversation.  -PT eval obtained.  SNF recommended.  Case management on board  Dysphagia -speech therapy and dietitian consult appreciated. Currently on dysphagia 1 diet.  IV fluid is stopped.  Multifocal atrial tachycardia -Cardio consult appreciated.   -Echo with normal EF 60 to 65%, mild LVH, grade 1 diastolic dysfunction. -Currently heart rate is controlled on Coreg.  Essential hypertension -Home Meds include Triamterene 75 mg daily, hydrochlorothiazide 50 mg daily, -Currently blood pressure controlled on Coreg.  Diuretics remain on hold.  Because of poor oral intake, I would continue to hold them.  Diabetes mellitus Hypoglycemia  -A1c 6.3 on June 2021. -On Actos 30 mg daily at home.  Oral intake is poor.  Will hold Actos at this time.  Fingerstick readings remain in normal range so far. Recent Labs  Lab 03/09/21 1721 03/09/21 2046 03/09/21 2344 03/10/21 0329 03/10/21 0732  GLUCAP 165* 108* 121* 125* 92   Chronic bilateral lower extremity wound -Wound culture consult following -As an outpatient, patient was seeing [facility]VS for venous insufficiency and ulcers on LE extremities. 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 She was last seen 02/13/21 and noted that venous stasis ulcers are improved with compression therapy.   Hyperlipidemia -intolerant to all statins and she did not want to take fish oil due to palpitations.   Sarcoidosis -Continue Medrol oral 4 mg twice daily  Mobility: Encourage ambulation.  PT  eval obtained. SNF recommended Code Status:   Code Status: Full Code  Nutritional status: Body mass index is 20.46 kg/m. Nutrition Problem: Increased nutrient needs Etiology: acute illness,wound  healing Signs/Symptoms: estimated needs Diet Order            DIET - DYS 1 Room service appropriate? Yes; Fluid consistency: Thin  Diet effective now           Diet - low sodium heart healthy                 DVT prophylaxis: Lovenox subcu   Antimicrobials:  None Fluid: Not on IV fluid Consultants: None Family Communication:  Discussed with daughter at bedside 3/10, 3/11, 3/12  Status is: Inpatient  Remains inpatient appropriate because: pending placement  Dispo: The patient is from: Home              Anticipated d/c is to: SNF              Patient currently is medically stable to d/c.   Difficult to place patient No   Infusions:    Scheduled Meds: . (feeding supplement) PROSource Plus  30 mL Oral BID BM  . amitriptyline  25 mg Oral QHS  . aspirin  81 mg Oral Daily  . carvedilol  12.5 mg Oral BID WC  . chlorhexidine  15 mL Mouth Rinse BID  . Chlorhexidine Gluconate Cloth  6 each Topical Q0600  . dorzolamide-timolol  1 drop Both Eyes BID  . enoxaparin (LOVENOX) injection  40 mg Subcutaneous Q24H  . feeding supplement (GLUCERNA SHAKE)  237 mL Oral TID BM  . gabapentin  300 mg Oral QHS  . methylPREDNISolone  4 mg Oral BID  . multivitamin with minerals  1 tablet Oral Daily  . mupirocin cream   Topical Daily  . pantoprazole  40 mg Oral Daily  . QUEtiapine  25 mg Oral QHS  . rOPINIRole  2 mg Oral QPC supper  . senna-docusate  1 tablet Oral QHS    Antimicrobials: Anti-infectives (From admission, onward)   None      PRN meds:    Objective: Vitals:   03/10/21 0000 03/10/21 0817  BP: (!) 131/51   Pulse:    Resp: (!) 22   Temp:  98.5 F (36.9 C)  SpO2: 97%     Intake/Output Summary (Last 24 hours) at 03/10/2021 0947 Last data filed at 03/10/2021 0000 Gross per 24 hour  Intake 409.85 ml  Output 600 ml  Net -190.15 ml   Filed Weights   03/04/21 1951 03/05/21 0600  Weight: 62.5 kg 57.5 kg   Weight change:  Body mass index is 20.46 kg/m.    Physical Exam: General exam: Pleasant, elderly Caucasian female. Not in physical distress.  Awake this morning.  Tired.   Skin: No rashes, lesions or ulcers. HEENT: Atraumatic, normocephalic, no obvious bleeding Lungs: Clear to auscultation bilaterally. CVS: Regular rate and rhythm, no murmur GI/Abd soft, nontender, nondistended, bowel sound present CNS: Alert, awake, hard of hearing but knows he is in the hospital. Psychiatry: Mood appropriate Extremities: No pedal edema, no calf tenderness.  Chronic wounds, scabs.  Data Review: I have personally reviewed the laboratory data and studies available.  Recent Labs  Lab 03/06/21 0242 03/07/21 0240 03/08/21 0253 03/09/21 0248 03/10/21 0227  WBC 9.4 8.7 6.4 6.0 8.6  NEUTROABS 7.6 6.8 5.0 4.2 6.2  HGB 12.3 11.9* 10.8* 11.0* 12.1  HCT 38.4 37.8 35.5* 36.2 39.6  MCV 96.7 98.4  101.1* 102.3* 101.8*  PLT 251 172 198 196 226   Recent Labs  Lab 03/06/21 0242 03/07/21 0240 03/08/21 0253 03/09/21 0248 03/10/21 0227  NA 139 139 141 143 141  K 3.5 3.3* 3.4* 3.6 3.6  CL 105 103 110 112* 107  CO2 22 23 23 22 25   GLUCOSE 130* 113* 137* 136* 128*  BUN 21 26* 25* 25* 22  CREATININE 0.84 0.84 0.71 0.62 0.56  CALCIUM 8.5* 8.3* 8.2* 8.2* 8.4*  MG 1.8  --   --   --   --   PHOS 3.2  --   --   --   --     F/u labs ordered Unresulted Labs (From admission, onward)          Start     Ordered   03/09/21 2440  Basic metabolic panel  Daily,   R     Question:  Specimen collection method  Answer:  Lab=Lab collect   03/08/21 0956   03/09/21 0500  CBC with Differential/Platelet  Daily,   R     Question:  Specimen collection method  Answer:  Lab=Lab collect   03/08/21 0956   03/05/21 0551  Urine rapid drug screen (hosp performed)  ONCE - STAT,   STAT        03/05/21 0551          Signed, Terrilee Croak, MD [facility] 03/10/2021

## 2021-03-10 NOTE — Progress Notes (Signed)
Patient pulled IV out. No IV access at this time and refusing to receive another IV. Jeannette Corpus, NP aware. Will continue to monitor patient.

## 2021-03-10 NOTE — TOC Progression Note (Signed)
Transition of Care [facility]) - Progression Note    Patient Details  Name: Sonya Goodwin MRN: [id number] Date of Birth: 23-Nov-1941  Transition of Care [facility]) CM/SW Contact  Darold Miley C Tarpley-Carter, LCSWA Phone Number: 03/10/2021, 9:45 AM  Clinical Narrative:    TOC CM/CSW spoke with pts daughter/[name] [phone].  [name] will be visiting facilities ([place], [place]).  [name] has an appt with [facility] to visit on 03/11/2021.  [name] would like to speak with [name] on Monday (03/12/2021), she is very concerned with pts placement.  Per [facility] request, CSW sent information to [facility] on 03/10/2021.  [name], MSW, LCSW-A Pronouns:  She, Her, Hers                  [name] Transitions of CareClinical Social Worker [name] [phone] Expected Discharge Plan: [place] Barriers to Discharge: Continued Medical Work up  Expected Discharge Plan and Services Expected Discharge Plan: [place]   Discharge Planning Services: CM Consult   Living arrangements for the past 2 months: Single Family Home Expected Discharge Date: 03/06/21                                     Social Determinants of Health (SDOH) Interventions    Readmission Risk Interventions No flowsheet data found.

## 2021-03-11 LAB — CBC WITH DIFFERENTIAL/PLATELET
Abs Immature Granulocytes: 0.13 10*3/uL — ABNORMAL HIGH (ref 0.00–0.07)
Basophils Absolute: 0 10*3/uL (ref 0.0–0.1)
Basophils Relative: 1 %
Eosinophils Absolute: 0 10*3/uL (ref 0.0–0.5)
Eosinophils Relative: 0 %
HCT: 39.3 % (ref 36.0–46.0)
Hemoglobin: 12.5 g/dL (ref 12.0–15.0)
Immature Granulocytes: 2 %
Lymphocytes Relative: 17 %
Lymphs Abs: 1.1 10*3/uL (ref 0.7–4.0)
MCH: 31.1 pg (ref 26.0–34.0)
MCHC: 31.8 g/dL (ref 30.0–36.0)
MCV: 97.8 fL (ref 80.0–100.0)
Monocytes Absolute: 0.6 10*3/uL (ref 0.1–1.0)
Monocytes Relative: 9 %
Neutro Abs: 4.4 10*3/uL (ref 1.7–7.7)
Neutrophils Relative %: 71 %
Platelets: 195 10*3/uL (ref 150–400)
RBC: 4.02 MIL/uL (ref 3.87–5.11)
RDW: 15.3 % (ref 11.5–15.5)
WBC: 6.2 10*3/uL (ref 4.0–10.5)
nRBC: 0 % (ref 0.0–0.2)

## 2021-03-11 LAB — BASIC METABOLIC PANEL
Anion gap: 10 (ref 5–15)
BUN: 23 mg/dL (ref 8–23)
CO2: 27 mmol/L (ref 22–32)
Calcium: 8.6 mg/dL — ABNORMAL LOW (ref 8.9–10.3)
Chloride: 103 mmol/L (ref 98–111)
Creatinine, Ser: 0.73 mg/dL (ref 0.44–1.00)
GFR, Estimated: >60 mL/min (ref >60)
Glucose, Bld: 138 mg/dL — ABNORMAL HIGH (ref 70–99)
Potassium: 3.3 mmol/L — ABNORMAL LOW (ref 3.5–5.1)
Sodium: 140 mmol/L (ref 135–145)

## 2021-03-11 LAB — GLUCOSE, CAPILLARY
Glucose-Capillary: 117 mg/dL — ABNORMAL HIGH (ref 70–99)
Glucose-Capillary: 135 mg/dL — ABNORMAL HIGH (ref 70–99)
Glucose-Capillary: 140 mg/dL — ABNORMAL HIGH (ref 70–99)
Glucose-Capillary: 141 mg/dL — ABNORMAL HIGH (ref 70–99)
Glucose-Capillary: 149 mg/dL — ABNORMAL HIGH (ref 70–99)
Glucose-Capillary: 175 mg/dL — ABNORMAL HIGH (ref 70–99)

## 2021-03-11 MED ORDER — POTASSIUM CHLORIDE CRYS ER 20 MEQ PO TBCR
40.0000 meq | EXTENDED_RELEASE_TABLET | Freq: Once | ORAL | Status: AC
  Administered 2021-03-11: 40 meq via ORAL
  Filled 2021-03-11: qty 2

## 2021-03-11 MED ORDER — GABAPENTIN 250 MG/5ML PO SOLN: 100.0000 mg | Freq: Two times a day (BID) | ORAL | Status: DC

## 2021-03-11 MED ORDER — GABAPENTIN 250 MG/5ML PO SOLN
100.0000 mg | Freq: Two times a day (BID) | ORAL | Status: DC
  Administered 2021-03-11 – 2021-03-13 (×5): 100 mg via ORAL
  Filled 2021-03-11 (×5): qty 2

## 2021-03-11 NOTE — Progress Notes (Signed)
PROGRESS NOTE  Sonya Goodwin  DOB: Nov 18, 1941  PCP: Hulan Fess, MD VOZ:[id number]  DOA: 03/04/2021  LOS: 4 days   Chief Complaint  Patient presents with  . Altered Mental Status    Brief narrative: Sonya Goodwin is a 80 y.o. female with PMH significant for DM2, HTN, HLD, intolerant to statins, mild OSA, sarcoidosis, adrenal insufficiency, lower extremity venous insufficiency and ulcers  and multiple hospitalizations in the past for altered mental status, presumably secondary to polypharmacy. Patient was brought to the ED on 3/6 with complaint of worsening confusion, agitation, generalized weakness. In the ED, patient was afebrile and hemodynamically stable.  CT scan of head was unremarkable. Labs showed mildly elevated AST, lactic acid elevated to 2.4, ammonia normal at 17, unremarkable CBC. Admitted to hospitalist service. See below for details  Subjective: Patient was seen and examined this morning. Alert, awake, this morning.  Daughter at bedside.  Patient had a rough afternoon yesterday.  Assessment/Plan: Acute encephalopathy Risk of polypharmacy -Patient was admitted for altered mental status. Multiple possible etiologies were considered.  There was no evidence of infection.  Patient was dehydrated as evidenced by elevated lactic acid level.  Patient has chronic moderate to severe ischemic changes in MRI brain.  EEG normal.  -Patient's mental status initially improved after IV hydration but later she started having frequent periods of agitation and delirium.  Her oral intake is compromised.   -She takes multiple mood altering medications at home.  They were initially held but had to be gradually resumed later.  -Her mental status has been waxing and waning in the hospital mostly seen during the day and with agitation and sundowning in the night.   -Currently on Neurontin 100 mg twice daily, Requip 2 mg at bedtime, Elavil 25 mg at bedtime, Seroquel 25 mg at bedtime and  tramadol as needed. Continue monitor mental status change.  Impaired mobility -Per daughter, until few days prior to presentation, patient was able to ambulate and have a conversation.  -PT eval obtained. SNF recommended. Case management on board  Dysphagia -speech therapy and dietitian consult appreciated. Currently on dysphagia 1 diet.  IV fluid is stopped.  Multifocal atrial tachycardia -Cardio consult appreciated.   -Echo with normal EF 60 to 65%, mild LVH, grade 1 diastolic dysfunction. -Currently heart rate is controlled on Coreg.  Essential hypertension -Home Meds include Triamterene 75 mg daily, hydrochlorothiazide 50 mg daily, -Currently blood pressure controlled on Coreg. Diuretics remain on hold. Because of poor oral intake, I would continue to hold them.  Diabetes mellitus Hypoglycemia  -A1c 6.3 on June 2021. -On Actos 30 mg daily at home.  Oral intake is poor.  Actos is on hold. Fingerstick readings remain in normal range so far. Recent Labs  Lab 03/10/21 1628 03/10/21 2047 03/11/21 0030 03/11/21 0439 03/11/21 0837  GLUCAP 129* 130* 135* 140* 117*   Chronic bilateral lower extremity wound -Wound culture consult following -As an outpatient, patient was seeing [facility] for venous insufficiency and ulcers on LE extremities. 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 She was last seen 02/13/21 and noted that venous stasis ulcers are improved with compression therapy.   Hyperlipidemia -intolerant to all statins and she did not want to take fish oil due to palpitations.   Sarcoidosis -Continue Medrol oral 4 mg twice daily  Mobility: Encourage ambulation.  PT eval obtained. SNF recommended Code Status:   Code Status: Full Code  Nutritional status: Body mass index is 20.46 kg/m. Nutrition Problem: Increased nutrient needs Etiology: acute illness,wound healing Signs/Symptoms: estimated  needs Diet Order            DIET - DYS 1 Room service appropriate? Yes; Fluid consistency: Thin  Diet effective now           Diet - low sodium heart healthy                 DVT prophylaxis: Lovenox subcu   Antimicrobials:  None Fluid: Not on IV fluid Consultants: None Family Communication:  Discussed with daughter at bedside 3/10, 3/11, 3/12, 3/13  Status is: Inpatient  Remains inpatient appropriate because: pending placement  Dispo: The patient is from: Home              Anticipated d/c is to: SNF              Patient currently is medically stable to d/c.   Difficult to place patient No   Infusions:    Scheduled Meds: . (feeding supplement) PROSource Plus  30 mL Oral BID BM  . amitriptyline  25 mg Oral QHS  . aspirin  81 mg Oral Daily  . carvedilol  12.5 mg Oral BID WC  . chlorhexidine  15 mL Mouth Rinse BID  . Chlorhexidine Gluconate Cloth  6 each Topical Q0600  . dorzolamide-timolol  1 drop Both Eyes BID  . enoxaparin (LOVENOX) injection  40 mg Subcutaneous Q24H  . feeding supplement (GLUCERNA SHAKE)  237 mL Oral TID BM  . gabapentin  100 mg Oral BID  . methylPREDNISolone  4 mg Oral BID  . multivitamin with minerals  1 tablet Oral Daily  . mupirocin cream   Topical Daily  . pantoprazole  40 mg Oral Daily  . QUEtiapine  25 mg Oral QHS  . rOPINIRole  2 mg Oral QPC supper  . senna-docusate  1 tablet Oral QHS    Antimicrobials: Anti-infectives (From admission, onward)   None      PRN meds:    Objective: Vitals:   03/11/21 0540 03/11/21 0920  BP: (!) 124/50   Pulse: (!) 58 88  Resp: 18   Temp: 97.6 F (36.4 C)   SpO2: 96%     Intake/Output Summary (Last 24 hours) at 03/11/2021 1114 Last data filed at 03/11/2021 0353 Gross per 24 hour  Intake --  Output 1600 ml  Net -1600 ml   Filed Weights   03/04/21 1951 03/05/21 0600  Weight: 62.5 kg 57.5 kg   Weight change:  Body mass index is 20.46 kg/m.   Physical Exam: General exam: Pleasant,  elderly Caucasian female. Not in physical distress.  Awake. Skin: No rashes, lesions or ulcers. HEENT: Atraumatic, normocephalic, no obvious bleeding Lungs: Clear to auscultation bilaterally CVS: Regular rate and rhythm, no murmur GI/Abd soft, nontender, nondistended, bowel sound present CNS: Alert, awake, hard of hearing knows he is in the hospital. Psychiatry: Mood appropriate Extremities: No pedal edema, no calf tenderness.  Chronic wounds, scabs.  Data Review: I have personally reviewed the laboratory data and studies available.  Recent Labs  Lab 03/07/21 0240 03/08/21 0253 03/09/21 0248 03/10/21 0227 03/11/21 0531  WBC 8.7 6.4 6.0 8.6 6.2  NEUTROABS 6.8 5.0 4.2 6.2 4.4  HGB 11.9* 10.8* 11.0* 12.1 12.5  HCT 37.8 35.5* 36.2 39.6 39.3  MCV 98.4 101.1* 102.3* 101.8* 97.8  PLT 172 198 196 226 195   Recent Labs  Lab 03/06/21 0242 03/07/21 0240  03/08/21 0253 03/09/21 0248 03/10/21 0227 03/11/21 0531  NA 139 139 141 143 141 140  K 3.5 3.3* 3.4* 3.6 3.6 3.3*  CL 105 103 110 112* 107 103  CO2 22 23 23 22 25 27   GLUCOSE 130* 113* 137* 136* 128* 138*  BUN 21 26* 25* 25* 22 23  CREATININE 0.84 0.84 0.71 0.62 0.56 0.73  CALCIUM 8.5* 8.3* 8.2* 8.2* 8.4* 8.6*  MG 1.8  --   --   --   --   --   PHOS 3.2  --   --   --   --   --     F/u labs ordered Unresulted Labs (From admission, onward)          Start     Ordered   03/05/21 0551  Urine rapid drug screen (hosp performed)  ONCE - STAT,   STAT        03/05/21 0551          Signed, Terrilee Croak, MD [facility] 03/11/2021

## 2021-03-11 NOTE — Progress Notes (Signed)
Pt with no urine output so far this shift. Bladder scan showed 770mL. Straight cath performed, yielding 1040mL cloudy, yellow urine. MD Dahal made aware. Will follow floor protocol for retention.

## 2021-03-12 LAB — URINALYSIS, ROUTINE W REFLEX MICROSCOPIC
Bilirubin Urine: NEGATIVE
Glucose, UA: 50 mg/dL — AB
Ketones, ur: NEGATIVE mg/dL
Leukocytes,Ua: NEGATIVE
Nitrite: NEGATIVE
Protein, ur: NEGATIVE mg/dL
Specific Gravity, Urine: 1.02 (ref 1.005–1.030)
pH: 5 (ref 5.0–8.0)

## 2021-03-12 LAB — GLUCOSE, CAPILLARY
Glucose-Capillary: 105 mg/dL — ABNORMAL HIGH (ref 70–99)
Glucose-Capillary: 114 mg/dL — ABNORMAL HIGH (ref 70–99)
Glucose-Capillary: 122 mg/dL — ABNORMAL HIGH (ref 70–99)
Glucose-Capillary: 142 mg/dL — ABNORMAL HIGH (ref 70–99)
Glucose-Capillary: 155 mg/dL — ABNORMAL HIGH (ref 70–99)
Glucose-Capillary: 155 mg/dL — ABNORMAL HIGH (ref 70–99)
Glucose-Capillary: 229 mg/dL — ABNORMAL HIGH (ref 70–99)
Glucose-Capillary: 231 mg/dL — ABNORMAL HIGH (ref 70–99)

## 2021-03-12 MED ORDER — MAGIC MOUTHWASH W/LIDOCAINE
5.0000 mL | Freq: Four times a day (QID) | ORAL | Status: DC | PRN
  Filled 2021-03-12: qty 5

## 2021-03-12 MED ORDER — CEPHALEXIN 500 MG PO CAPS
500.0000 mg | ORAL_CAPSULE | Freq: Three times a day (TID) | ORAL | Status: DC
  Administered 2021-03-12 – 2021-03-13 (×3): 500 mg via ORAL
  Filled 2021-03-12 (×3): qty 1

## 2021-03-12 MED ORDER — SALINE SPRAY 0.65 % NA SOLN
1.0000 | NASAL | Status: DC | PRN
  Filled 2021-03-12: qty 44

## 2021-03-12 NOTE — TOC Progression Note (Addendum)
Transition of Care [facility]) - Progression Note    Patient Details  Name: Sonya Goodwin MRN: [id number] Date of Birth: [birth year]  [facility] CM/SW Contact  Leeroy Cha, RN Phone Number: 03/12/2021, 10:31 AM  Clinical Narrative:    Spoke at length with Daughter [name] and the granddaughter christy/they are wanting to take patient home, are willing to get 24/7 sitter/aide coverage so that someone will be with the patient at all times. Do have an electric bed in the home with rails.  Will need 3 in 1 commode.  Pt to work with patient today about getting up in a chair and pivoting.  Will need non emergent ambulance travel to home.  Does not have a preference about which hhc agency to use. 1134-text for hhc sent to [facility] .  RN, ot, pt and aide. 1207rec'd text from [facility] will accept patient. Ordered 3 in 1 from [employer] at 1207 to be delivered to the roiom Expected Discharge Plan: [place] Barriers to Discharge: Continued Medical Work up  Expected Discharge Plan and Services Expected Discharge Plan: [place]   Discharge Planning Services: CM Consult   Living arrangements for the past 2 months: Single Family Home Expected Discharge Date: 03/06/21                                     Social Determinants of Health (SDOH) Interventions    Readmission Risk Interventions No flowsheet data found.

## 2021-03-12 NOTE — Care Management Important Message (Signed)
Important Message  Patient Details IM Letter given to the Patient. Name: Sonya Goodwin MRN: [id number] Date of Birth: 04-Nov-1941   Medicare Important Message Given:  Yes     Kerin Salen 03/12/2021, 12:19 PM

## 2021-03-12 NOTE — Progress Notes (Signed)
Physical Therapy Treatment Patient Details Name: Sonya Goodwin MRN: [id number] DOB: 1941/10/23 Today's Date: 03/12/2021    History of Present Illness Patient is 80 y.o. female with PMH significant for DM2, HTN, HLD, intolerant to statins, mild OSA, sarcoidosis, adrenal insufficiency, lower extremity venous insufficiency and ulcers  and multiple hospitalizations in the past for AMS. Patient presented 3/4 with c/o weakness, and AMS.    PT Comments    Progressing slowly with mobility. Pt continues to require +2 assist for mobility. Family present during session to assist/observe/encourage pt. Per family and chart, plan is for d/c home. Will recommend HHPT f/u if/when pt returns home.     Follow Up Recommendations  SNF;Supervision/Assistance - 24 hour (family prefers home-plan in place)     Equipment Recommendations  None recommended by PT    Recommendations for Other Services       Precautions / Restrictions Precautions Precautions: Fall Restrictions Weight Bearing Restrictions: No    Mobility  Bed Mobility Overal bed mobility: Needs Assistance Bed Mobility: Supine to Sit     Supine to sit: Min assist;+2 for physical assistance;+2 for safety/equipment;HOB elevated     General bed mobility comments: Assist for trunk and bil LEs. Utilized bedpad for scooting, positioning. Increased time. Cueing required.    Transfers Overall transfer level: Needs assistance Equipment used: 2 person hand held assist Transfers: Sit to/from Stand Sit to Stand: Mod assist;+2 physical assistance;+2 safety/equipment Stand pivot transfers: Mod assist;+2 physical assistance;+2 safety/equipment       General transfer comment: Pt does not like to use walkers (she prefers crutches). No crutches available at this time so used 2 HHA with therapist supporting pt underneath both armpits. Assist to power up, stabilize, control descent. Sit to stand x 3.  Ambulation/Gait Ambulation/Gait  assistance: Mod assist;+2 physical assistance;+2 safety/equipment Gait Distance (Feet): 4 Feet Assistive device: 2 person hand held assist Gait Pattern/deviations: Step-to pattern     General Gait Details: Assist to support pt underneath armpits. Pt is weak and fearful of falling. She also has bil knee pain with ambulation. She was able to take a few steps in room. Recliner brought up behind her to sit.   Stairs             Wheelchair Mobility    Modified Rankin (Stroke Patients Only)       Balance Overall balance assessment: Needs assistance         Standing balance support: Bilateral upper extremity supported Standing balance-Leahy Scale: Poor                              Cognition Arousal/Alertness: Awake/alert Behavior During Therapy: WFL for tasks assessed/performed Overall Cognitive Status: Impaired/Different from baseline Area of Impairment: Following commands;Attention                   Current Attention Level: Alternating   Following Commands: Follows multi-step commands with increased time     Problem Solving: Slow processing General Comments: Pt requiring increased time for processing and repeated cuing of commands during session. Pt intermittently going off on tangent during conversations with therapist.      Exercises      General Comments        Pertinent Vitals/Pain Pain Assessment: Faces Faces Pain Scale: Hurts little more Pain Location: bil knees, bil arms Pain Descriptors / Indicators: Sore;Tender;Discomfort Pain Intervention(s): Limited activity within patient's tolerance;Monitored during session;Repositioned    Home Living  Prior Function            PT Goals (current goals can now be found in the care plan section) Progress towards PT goals: Progressing toward goals    Frequency    Min 3X/week      PT Plan Current plan remains appropriate;Frequency needs to be updated     Co-evaluation              AM-PAC PT "6 Clicks" Mobility   Outcome Measure  Help needed turning from your back to your side while in a flat bed without using bedrails?: A Lot Help needed moving from lying on your back to sitting on the side of a flat bed without using bedrails?: A Lot Help needed moving to and from a bed to a chair (including a wheelchair)?: A Lot Help needed standing up from a chair using your arms (e.g., wheelchair or bedside chair)?: A Lot Help needed to walk in hospital room?: Total Help needed climbing 3-5 steps with a railing? : Total 6 Click Score: 10    End of Session Equipment Utilized During Treatment: Gait belt Activity Tolerance: Patient limited by fatigue;Patient limited by pain Patient left: in chair;with call bell/phone within reach;with family/visitor present;with chair alarm set   PT Visit Diagnosis: Muscle weakness (generalized) (M62.81);Difficulty in walking, not elsewhere classified (R26.2);History of falling (Z91.81)     Time: 4132-4401 PT Time Calculation (min) (ACUTE ONLY): 21 min  Charges:  $Gait Training: 8-22 mins                         Doreatha Massed, PT Acute Rehabilitation  Office: [phone] Pager: [phone]

## 2021-03-12 NOTE — Progress Notes (Signed)
PROGRESS NOTE  Sonya Goodwin  DOB: 11-11-1941  PCP: Hulan Fess, MD MOQ:[id number]  DOA: 03/04/2021  LOS: 5 days   Chief Complaint  Patient presents with  . Altered Mental Status    Brief narrative: Sonya Goodwin is a 80 y.o. female with PMH significant for DM2, HTN, HLD, intolerant to statins, mild OSA, sarcoidosis, adrenal insufficiency, lower extremity venous insufficiency and ulcers  and multiple hospitalizations in the past for altered mental status, presumably secondary to polypharmacy. Patient was brought to the ED on 3/6 with complaint of worsening confusion, agitation, generalized weakness. In the ED, patient was afebrile and hemodynamically stable.  CT scan of head was unremarkable. Labs showed mildly elevated AST, lactic acid elevated to 2.4, ammonia normal at 17, unremarkable CBC. Admitted to hospitalist service. See below for details.  Subjective: Patient was seen and examined this morning. Propped up in bed.  Not in distress. Granddaughter at bedside. In last 24 hours, patient is having urinary retention.  Required in and out catheterization x4.  Foley catheter inserted later this morning.  Assessment/Plan: Acute encephalopathy Risk of polypharmacy -Patient was admitted for altered mental status. Multiple possible etiologies were considered.  There was no evidence of infection.  Patient was dehydrated as evidenced by elevated lactic acid level.  Patient has chronic moderate to severe ischemic changes in MRI brain.  EEG normal.  -Patient's mental status initially improved after IV hydration but later she started having frequent periods of agitation and delirium.  Her oral intake is compromised.   -She takes multiple mood altering medications at home.  They were initially held but had to be gradually resumed later.  -Her mental status has been waxing and waning in the hospital mostly seen during the day and with agitation and sundowning in the night.    -Currently on Neurontin 100 mg twice daily, Requip 2 mg at bedtime, Elavil 25 mg at bedtime, Seroquel 25 mg at bedtime and tramadol as needed. Continue monitor mental status change.  Impaired mobility -Per daughter, until few days prior to presentation, patient was able to ambulate and have a conversation.  -PT eval obtained. SNF recommended.  Patient's family did not visit you approved SNFs.  They want to take her home with home health arrangements.  Dysphagia -speech therapy and dietitian consult appreciated. Currently on dysphagia 1 diet.   Acute urinary retention -In last 24 hours, patient is having urinary retention.  Required in and out catheterization x4.  Foley catheter inserted later this morning. -Obtain urinalysis.  Start on empiric Keflex  Multifocal atrial tachycardia -Cardio consult appreciated.   -Echo with normal EF 60 to 65%, mild LVH, grade 1 diastolic dysfunction. -Currently heart rate is controlled on Coreg.  Essential hypertension -Home Meds include Triamterene 75 mg daily, hydrochlorothiazide 50 mg daily, -Currently blood pressure controlled on Coreg. Diuretics remain on hold. Because of poor oral intake, I would continue to hold them.  Diabetes mellitus Hypoglycemia  -A1c 6.3 on June 2021. -On Actos 30 mg daily at home.  Oral intake is poor.  Actos is on hold. Fingerstick readings remain in normal range so far. Recent Labs  Lab 03/11/21 2120 03/12/21 0001 03/12/21 0328 03/12/21 0840 03/12/21 1150  GLUCAP 141* 122* 155* 114* 105*   Chronic bilateral lower extremity wound -Wound culture consult following -As an outpatient, patient was seeing [facility] for venous insufficiency and ulcers on LE extremities. 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 She was last seen 02/13/21 and noted that venous stasis ulcers are improved with compression therapy.   Hyperlipidemia -intolerant  to all statins and she did not want to take fish oil due to palpitations.   Sarcoidosis -Continue Medrol oral 4 mg twice daily  Mobility: Encourage ambulation.  PT eval obtained. SNF recommended Code Status:   Code Status: Full Code  Nutritional status: Body mass index is 20.46 kg/m. Nutrition Problem: Increased nutrient needs Etiology: acute illness,wound healing Signs/Symptoms: estimated needs Diet Order            DIET - DYS 1 Room service appropriate? Yes; Fluid consistency: Thin  Diet effective now           Diet - low sodium heart healthy                 DVT prophylaxis: Lovenox subcu   Antimicrobials:  None Fluid: Not on IV fluid Consultants: None Family Communication:  Discussed with patient's family at bedside 3/10, 3/11, 3/12, 3/13  Status is: Inpatient  Remains inpatient appropriate because: pending placement  Dispo: The patient is from: Home              Anticipated d/c is to: SNF              Patient currently is medically stable to d/c.   Difficult to place patient No   Infusions:    Scheduled Meds: . (feeding supplement) PROSource Plus  30 mL Oral BID BM  . amitriptyline  25 mg Oral QHS  . aspirin  81 mg Oral Daily  . carvedilol  12.5 mg Oral BID WC  . cephALEXin  500 mg Oral Q8H  . chlorhexidine  15 mL Mouth Rinse BID  . Chlorhexidine Gluconate Cloth  6 each Topical Q0600  . dorzolamide-timolol  1 drop Both Eyes BID  . enoxaparin (LOVENOX) injection  40 mg Subcutaneous Q24H  . feeding supplement (GLUCERNA SHAKE)  237 mL Oral TID BM  . gabapentin  100 mg Oral BID  . methylPREDNISolone  4 mg Oral BID  . multivitamin with minerals  1 tablet Oral Daily  . mupirocin cream   Topical Daily  . pantoprazole  40 mg Oral Daily  . QUEtiapine  25 mg Oral QHS  . rOPINIRole  2 mg Oral QPC supper  . senna-docusate  1 tablet Oral QHS    Antimicrobials: Anti-infectives (From admission, onward)   Start     Dose/Rate Route Frequency Ordered Stop    03/12/21 1200  cephALEXin (KEFLEX) capsule 500 mg        500 mg Oral Every 8 hours 03/12/21 1125        PRN meds:    Objective: Vitals:   03/12/21 0429 03/12/21 1317  BP: (!) 111/57 (!) 107/55  Pulse: 65 81  Resp: (!) 22 18  Temp: 98.5 F (36.9 C) 98.3 F (36.8 C)  SpO2: 91% 92%    Intake/Output Summary (Last 24 hours) at 03/12/2021 1332 Last data filed at 03/12/2021 0333 Gross per 24 hour  Intake 320 ml  Output 1625 ml  Net -1305 ml   Filed Weights   03/04/21 1951 03/05/21 0600  Weight: 62.5 kg 57.5 kg   Weight change:  Body mass index is 20.46 kg/m.   Physical Exam: General exam: Pleasant, elderly Caucasian female. Not in pain. Skin: No rashes, lesions or ulcers. HEENT: Atraumatic, normocephalic, no obvious bleeding Lungs: Clear to auscultation bilaterally CVS: Regular rate and rhythm, no murmur GI/Abd  soft, nontender, nondistended, bowel sound present CNS: Alert, awake, hard of hearing.  Oriented to place and person Psychiatry: Mood appropriate Extremities: No pedal edema, no calf tenderness.  Chronic wounds, scabs.  Data Review: I have personally reviewed the laboratory data and studies available.  Recent Labs  Lab 03/07/21 0240 03/08/21 0253 03/09/21 0248 03/10/21 0227 03/11/21 0531  WBC 8.7 6.4 6.0 8.6 6.2  NEUTROABS 6.8 5.0 4.2 6.2 4.4  HGB 11.9* 10.8* 11.0* 12.1 12.5  HCT 37.8 35.5* 36.2 39.6 39.3  MCV 98.4 101.1* 102.3* 101.8* 97.8  PLT 172 198 196 226 195   Recent Labs  Lab 03/06/21 0242 03/07/21 0240 03/08/21 0253 03/09/21 0248 03/10/21 0227 03/11/21 0531  NA 139 139 141 143 141 140  K 3.5 3.3* 3.4* 3.6 3.6 3.3*  CL 105 103 110 112* 107 103  CO2 22 23 23 22 25 27   GLUCOSE 130* 113* 137* 136* 128* 138*  BUN 21 26* 25* 25* 22 23  CREATININE 0.84 0.84 0.71 0.62 0.56 0.73  CALCIUM 8.5* 8.3* 8.2* 8.2* 8.4* 8.6*  MG 1.8  --   --   --   --   --   PHOS 3.2  --   --   --   --   --     F/u labs ordered Unresulted Labs (From admission,  onward)          Start     Ordered   03/12/21 0909  Culture, Urine  Once,   R        03/12/21 0908   03/12/21 0844  Urinalysis, Routine w reflex microscopic  Once,   R        03/12/21 0843          Signed, Terrilee Croak, MD [facility] 03/12/2021

## 2021-03-12 NOTE — Progress Notes (Signed)
  Speech Language Pathology Treatment: Dysphagia  Patient Details Name: Sonya Goodwin MRN: [id number] DOB: 04-01-1941 Today's Date: 03/12/2021 Time: 4098-1191 SLP Time Calculation (min) (ACUTE ONLY): 15 min  Assessment / Plan / Recommendation Clinical Impression  Pt seen at bedside for skilled ST intervention targeting goals for diet tolerance. Pt was fully awake, alert, and conversant but confused. She continues to tolerate puree diet and thin liquids without overt s/s aspiration. Daughter reports puree consistency is most beneficial due to dentition with missing caps. She has encouraged pt to swish liquids around in her mouth prior to swallowing, which has served to increase awareness of the bolus and minimize premature spillage over the tongue base. Will continue pureed solids and thin liquids. ST signing off - all goals met. Please reconsult if needs arise.    HPI HPI: 80yo female admitted 03/04/21 with AMS and hallucinations, foul smelling urine x3 days. PMH: Barrett's esophagus, GERD, dysphagia, HTN, sarcoidosis, metabolic encephalopathy (4/78), adrenal insufficiency, DM2, CKD, venous insufficiency with LE wounds. Has required haldol for agitation. Deaf in right ear. CXR without infiltrate. MRI = no acute abnormality      SLP Plan  Discharge SLP treatment due to goals met      Recommendations  Diet recommendations: Dysphagia 1 (puree);Thin liquid Liquids provided via: Straw;Cup Medication Administration: Crushed with puree Supervision: Patient able to self feed;Staff to assist with self feeding;Full supervision/cueing for compensatory strategies Compensations: Minimize environmental distractions;Slow rate;Small sips/bites;Follow solids with liquid;Other (Comment) Postural Changes and/or Swallow Maneuvers: Seated upright 90 degrees;Upright 30-60 min after meal                Oral Care Recommendations: Oral care QID Follow up Recommendations: 24 hour  supervision/assistance SLP Visit Diagnosis: Dysphagia, unspecified (R13.10) Plan: Discharge SLP treatment due to (comment)       GO               Sully Dyment B. Quentin Ore, [facility], [place] Speech Language Pathologist Office: [phone] Pager: [phone]  Shonna Chock 03/12/2021, 12:15 PM

## 2021-03-13 ENCOUNTER — Encounter (HOSPITAL_BASED_OUTPATIENT_CLINIC_OR_DEPARTMENT_OTHER): Payer: Medicare HMO | Admitting: Internal Medicine

## 2021-03-13 DIAGNOSIS — R531 Weakness: Secondary | ICD-10-CM | POA: Diagnosis not present

## 2021-03-13 DIAGNOSIS — M2550 Pain in unspecified joint: Secondary | ICD-10-CM | POA: Diagnosis not present

## 2021-03-13 DIAGNOSIS — Z7401 Bed confinement status: Secondary | ICD-10-CM | POA: Diagnosis not present

## 2021-03-13 DIAGNOSIS — R410 Disorientation, unspecified: Secondary | ICD-10-CM | POA: Diagnosis not present

## 2021-03-13 DIAGNOSIS — R4182 Altered mental status, unspecified: Secondary | ICD-10-CM | POA: Diagnosis not present

## 2021-03-13 DIAGNOSIS — D860 Sarcoidosis of lung: Secondary | ICD-10-CM | POA: Diagnosis not present

## 2021-03-13 DIAGNOSIS — N183 Chronic kidney disease, stage 3 unspecified: Secondary | ICD-10-CM | POA: Diagnosis not present

## 2021-03-13 DIAGNOSIS — Z743 Need for continuous supervision: Secondary | ICD-10-CM | POA: Diagnosis not present

## 2021-03-13 DIAGNOSIS — R0902 Hypoxemia: Secondary | ICD-10-CM | POA: Diagnosis not present

## 2021-03-13 DIAGNOSIS — G9340 Encephalopathy, unspecified: Secondary | ICD-10-CM | POA: Diagnosis not present

## 2021-03-13 LAB — GLUCOSE, CAPILLARY
Glucose-Capillary: 143 mg/dL — ABNORMAL HIGH (ref 70–99)
Glucose-Capillary: 115 mg/dL — ABNORMAL HIGH (ref 70–99)
Glucose-Capillary: 116 mg/dL — ABNORMAL HIGH (ref 70–99)

## 2021-03-13 MED ORDER — QUETIAPINE FUMARATE 25 MG PO TABS: 25.0000 mg | ORAL_TABLET | Freq: Every day | ORAL | 2 refills | Status: AC

## 2021-03-13 MED ORDER — MAGIC MOUTHWASH W/LIDOCAINE: 5.0000 mL | Freq: Four times a day (QID) | ORAL | 0 refills | Status: AC | PRN

## 2021-03-13 MED ORDER — FOOD THICKENER (SIMPLYTHICK): 1.0000 | ORAL | Status: AC | PRN

## 2021-03-13 MED ORDER — PROSOURCE PLUS PO LIQD: 30.0000 mL | Freq: Two times a day (BID) | ORAL | Status: AC

## 2021-03-13 MED ORDER — CEPHALEXIN 500 MG PO CAPS
500.0000 mg | ORAL_CAPSULE | Freq: Three times a day (TID) | ORAL | 0 refills | Status: AC
Start: 2021-03-13 — End: 2021-03-16

## 2021-03-13 MED ORDER — CARVEDILOL 12.5 MG PO TABS: 12.5000 mg | ORAL_TABLET | Freq: Two times a day (BID) | ORAL | 2 refills | Status: AC

## 2021-03-13 MED ORDER — GLUCERNA SHAKE PO LIQD: 237.0000 mL | Freq: Three times a day (TID) | ORAL | 0 refills | Status: AC

## 2021-03-13 NOTE — Plan of Care (Signed)

## 2021-03-13 NOTE — Progress Notes (Signed)
Patient and her family given discharge, follow up, and medication instructions, verbalized understanding, telemetry monitor removed, foley catheter intact for DC home, personal belongings and prescriptions with family, PTAR to transport home

## 2021-03-13 NOTE — TOC Progression Note (Signed)
Transition of Care [facility]) - Progression Note    Patient Details  Name: Sonya Goodwin MRN: [id number] Date of Birth: July 31, 1941  [facility] CM/SW Contact  Leeroy Cha, RN Phone Number: 03/13/2021, 10:47 AM  Clinical Narrative:    Patient dcd to home with family via ptar , home health and dme in place.   Expected Discharge Plan: [place] Barriers to Discharge: Barriers Resolved  Expected Discharge Plan and Services Expected Discharge Plan: [place]   Discharge Planning Services: CM Consult Post Acute Care Choice: Durable Medical Equipment Living arrangements for the past 2 months: Single Family Home Expected Discharge Date: 03/13/21               DME Arranged: Youth worker wheelchair with seat cushion DME Agency: [employer] Date DME Agency Contacted: 03/12/21 Time DME Agency Contacted: 1209 Representative spoke with at DME Agency: zack [email or web address] HH Arranged: RN,PT,OT,Nurse's Aide [place]: [facility] [employer] [facility] [employer] (now [place]) Date [place]: 03/12/21 Time [place]: 1211 Representative spoke with at [place]: [place] ([place]) Interventions    Readmission Risk Interventions No flowsheet data found.

## 2021-03-13 NOTE — Progress Notes (Addendum)
41- Myself and the Nurse tech attempted to check patient's vitals and to see if patient had soiled the bed, patient refused and became agitated. Patient would not take medication at this time and would not allow dressing changes to be completed. Daughter is at bedside and supported mother's decision. I asked the daughter to alert me with any changes and I will reevaluate the situation and attempt to complete care within the hour.   0600- Spoke with patient's daughter, she does not want to give any PRN medication for agitation and patient is refusing morning medication.  0625- Reassessed situation, patient was willing to take her morning medication and allowed me to change the dressing on her left forearm that was soiled. Patient still refused vital signs and did not want to be checked to see if the bed was soiled. Call bell left within reach, patient is no distress,  and patient answered no when asked if I could help her with anything else this morning. Daughter at bedside

## 2021-03-13 NOTE — Discharge Summary (Signed)
Physician Discharge Summary  Sonya Goodwin PYP:[id number] DOB: 1941/05/31 DOA: 03/04/2021  PCP: Hulan Fess, MD  Admit date: 03/04/2021 Discharge date: 03/13/2021  Admitted From: Home Discharge disposition: Home with home health services   Code Status: Full Code  Diet Recommendation: Dysphagia 1 diet  Discharge Diagnosis:   Principal Problem:   Acute encephalopathy Active Problems:   Sarcoidosis   Adrenal insufficiency ([place])   Chronic kidney disease, stage 3a ([place])   Essential hypertension   Type 2 diabetes mellitus with stage 3 chronic kidney disease ([place])   Tachycardia   Pressure injury of skin   Multifocal atrial tachycardia ([place])   AMS (altered mental status)  Chief Complaint  Patient presents with  . Altered Mental Status    Brief narrative: Sonya Goodwin is a 80 y.o. female with PMH significant for DM2, HTN, HLD, intolerant to statins, mild OSA, sarcoidosis, adrenal insufficiency, lower extremity venous insufficiency and ulcers  and multiple hospitalizations in the past for altered mental status, presumably secondary to polypharmacy. Patient was brought to the ED on 3/6 with complaint of worsening confusion, agitation, generalized weakness. In the ED, patient was afebrile and hemodynamically stable.  CT scan of head was unremarkable. Labs showed mildly elevated AST, lactic acid elevated to 2.4, ammonia normal at 17, unremarkable CBC. Admitted to hospitalist service. See below for details.  Subjective: Patient was seen and examined this morning. Propped up in bed.  Not in distress.  Alert, awake, oriented to place and person. Husband, daughter and other family members at bedside Everyone is excited because she is going home today.  Assessment/Plan: Acute encephalopathy Risk of polypharmacy -Patient was admitted for altered mental status. Multiple possible etiologies were considered.  There was no evidence of infection.  Patient was dehydrated as  evidenced by elevated lactic acid level.  Patient has chronic moderate to severe ischemic changes in MRI brain.  EEG normal.  -Change mental status was suspected to be due to dehydration and influenza of multiple mood altering medications.  She was given adequate IV hydration.  Neuropsych meds were held and gradually resumed once her mental status improved. -Since then, her mental status has been waxing and waning in the hospital mostly, during the day and with agitation and sundowning in the night.   -Currently on Neurontin 100 mg twice daily, Requip 2 mg at bedtime, Elavil 25 mg at bedtime, Seroquel 25 mg at bedtime and tramadol as needed. Continue the same post discharge.  Impaired mobility -Per daughter, until few days prior to presentation, patient was able to ambulate and have a conversation.  She has gotten weak because of illness and prolonged hospitalization. -PT eval obtained. SNF recommended.  Patient's family did not visit you approved SNFs.  They want to take her home with home health arrangements.  Dysphagia -speech therapy and dietitian consult appreciated. Currently on dysphagia 1 diet.  Continue same.  Follow-up with speech as an outpatient.  Acute urinary retention -3/13, patient was noted to have urinary retention. Required in and out catheterization x4. Foley catheter inserted. -Empirically started on 3-day course of Keflex. -Patient will follow up at [facility] urology for catheter removal and voiding trial in 1 week  Multifocal atrial tachycardia -Cardio consult appreciated.   -Echo with normal EF 60 to 65%, mild LVH, grade 1 diastolic dysfunction. -Currently heart rate is controlled on Coreg.  Essential hypertension -Home Meds include Triamterene 75 mg daily, hydrochlorothiazide 50 mg daily, -Currently blood pressure controlled on Coreg. Diuretics remain on hold. Because  of poor oral intake, and risk of recurrent dehydration I would continue to hold them.  Diabetes  mellitus Hypoglycemia  -A1c 6.3 on June 2021. -On Actos 30 mg daily at home.  Oral intake is poor.  Actos is on hold. Fingerstick readings remain in normal range so far. Recent Labs  Lab 03/12/21 1647 03/12/21 1945 03/12/21 2352 03/13/21 0511 03/13/21 0817  GLUCAP 231* 155* 142* 143* 115*   Chronic bilateral lower extremity wound -Wound culture consult obtained. -As an outpatient, patient was seeing [facility] for venous insufficiency and ulcers on LE extremities. 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 She was last seen 02/13/21 and noted that venous stasis ulcers are improved with compression therapy.  -Patient and her family have been satisfied with improvement in her lower extremity wounds.  Continue wound care with home health nurse  Hyperlipidemia -intolerant to all statins and she did not want to take fish oil due to palpitations.   Sarcoidosis -Continue Medrol oral 4 mg twice daily   Wound care: Wound / Incision (Open or Dehisced) 06/17/20 Other (Comment) Elbow Anterior;Right Skin tear on right antecubital area (Active)  Date First Assessed/Time First Assessed: 06/17/20 0640   Wound Type: (c) Other (Comment)  Location: Elbow  Location Orientation: Anterior;Right  Wound Description (Comments): Skin tear on right antecubital area    Assessments 06/17/2020  3:00 PM 06/21/2020 11:26 AM  Dressing Type Gauze (Comment) Foam - Lift dressing to assess site every shift  Dressing Status Clean;Dry;Intact Clean;Dry;Intact  Dressing Change Frequency PRN --  Site / Wound Assessment Dry;Clean --  Treatment Packing (Dry gauze) --     Inactive Orders  Date Order Priority Status Authorizing Provider  16/10/96 0454 Silicone non-adherent gauze Routine Discontinued Charlynne Cousins, MD     Pressure Injury 03/05/21 Sacrum Mid Stage 1 -  Intact skin with non-blanchable redness of a localized area usually over a  bony prominence. (Active)  Date First Assessed/Time First Assessed: 03/05/21 0630   Location: Sacrum  Location Orientation: Mid  Staging: Stage 1 -  Intact skin with non-blanchable redness of a localized area usually over a bony prominence.  Present on Admission: Yes    Assessments 03/05/2021  6:45 AM 03/13/2021  8:52 AM  Dressing Type Foam - Lift dressing to assess site every shift --  Dressing Clean;Dry;Intact Intact  Site / Wound Assessment Clean;Dry;Red --  Drainage Amount None --     No Linked orders to display     Wound / Incision (Open or Dehisced) 03/08/21 Skin tear Arm Anterior;Left;Upper (Active)  Date First Assessed/Time First Assessed: 03/08/21 0800   Wound Type: Skin tear  Location: Arm  Location Orientation: Anterior;Left;Upper  Present on Admission: Yes    Assessments 03/08/2021  8:17 AM 03/13/2021  8:52 AM  Dressing Type Gauze (Comment) --  Dressing Status -- Intact  Drainage Amount None --     No Linked orders to display     Wound / Incision (Open or Dehisced) 03/08/21 Pretibial Proximal;Right (Active)  Date First Assessed: 03/08/21   Location: Pretibial  Location Orientation: Proximal;Right  Present on Admission: Yes    Assessments 03/08/2021  8:17 AM 03/13/2021  8:52 AM  Dressing Type Foam - Lift dressing to assess site every shift --  Dressing Status -- Intact  Drainage Amount None --     No Linked orders to display    Discharge Exam:   Vitals:   03/11/21 2047 03/12/21  0429 03/12/21 1317 03/12/21 1950  BP:  (!) 111/57 (!) 107/55 140/76  Pulse:  65 81 77  Resp: 20 (!) 22 18 20   Temp:  98.5 F (36.9 C) 98.3 F (36.8 C) 98.6 F (37 C)  TempSrc: Oral Oral Oral Oral  SpO2: 93% 91% 92% 93%  Weight:      Height:        Body mass index is 20.46 kg/m.  General exam: Pleasant, elderly Caucasian female.  Not in distress.  Foley catheter with clear urine Skin: No rashes, lesions or ulcers. HEENT: Atraumatic, normocephalic, no obvious bleeding Lungs: Clear to  auscultation bilaterally CVS: Regular rate and rhythm, no murmur GI/Abd soft, nontender, nondistended, bowel sound present CNS: Alert, awake, oriented to place and person, not feeling Psychiatry: Mood appropriate Extremities: No pedal edema, no calf tenderness.  Scabbed wounds present.  Follow ups:   Discharge Instructions    Diet - low sodium heart healthy   Complete by: As directed    Diet general   Complete by: As directed    Discharge wound care:   Complete by: As directed    Cleanse legs with soap and water and pat dry.  Apply mupirocin ointment to open wounds.  Cover with foam dressings.  Re-apply daily and change foam every three days.   Discharge wound care:   Complete by: As directed    Cleanse legs with soap and water and pat dry.  Apply mupirocin ointment to open wounds.  Cover with foam dressings.  Re-apply daily and change foam every three days.   Increase activity slowly   Complete by: As directed    Increase activity slowly   Complete by: As directed       Follow-up Information    Hulan Fess, MD Follow up.   Specialty: Family Medicine Contact information: [place] [address] [phone]        Skeet Latch, MD .   Specialty: Cardiology Contact information: [address] [place] [address] [phone]        [place] Follow up.   Contact information: [place] [phone]              Recommendations for Outpatient Follow-Up:   1. Follow-up with PCP as an outpatient  Discharge Instructions:  Follow with Primary MD Hulan Fess, MD in 7 days   Get CBC/BMP checked in next visit within 1 week by PCP or SNF MD ( we routinely change or add medications that can affect your baseline labs and fluid status, therefore we recommend that you get the mentioned basic workup next visit with your PCP, your PCP may decide not to get them or add  new tests based on their clinical decision)  On your next visit with your PCP, please Get Medicines reviewed and adjusted.  Please request your PCP  to go over all Hospital Tests and Procedure/Radiological results at the follow up, please get all Hospital records sent to your Prim MD by signing hospital release before you go home.  Activity: As tolerated with Full fall precautions use walker/cane & assistance as needed  For Heart failure patients - Check your Weight same time everyday, if you gain over 2 pounds, or you develop in leg swelling, experience more shortness of breath or chest pain, call your Primary MD immediately. Follow Cardiac Low Salt Diet and 1.5 lit/day fluid restriction.  If you have smoked or chewed  Tobacco in the last 2 yrs please stop smoking, stop any regular Alcohol  and or any Recreational drug use.  If you experience worsening of your admission symptoms, develop shortness of breath, life threatening emergency, suicidal or homicidal thoughts you must seek medical attention immediately by calling 911 or calling your MD immediately  if symptoms less severe.  You Must read complete instructions/literature along with all the possible adverse reactions/side effects for all the Medicines you take and that have been prescribed to you. Take any new Medicines after you have completely understood and accpet all the possible adverse reactions/side effects.   Do not drive, operate heavy machinery, perform activities at heights, swimming or participation in water activities or provide baby sitting services if your were admitted for syncope or siezures until you have seen by Primary MD or a Neurologist and advised to do so again.  Do not drive when taking Pain medications.  Do not take more than prescribed Pain, Sleep and Anxiety Medications  Wear Seat belts while driving.   Please note You were cared for by a hospitalist during your hospital stay. If you have any questions about  your discharge medications or the care you received while you were in the hospital after you are discharged, you can call the unit and asked to speak with the hospitalist on call if the hospitalist that took care of you is not available. Once you are discharged, your primary care physician will handle any further medical issues. Please note that NO REFILLS for any discharge medications will be authorized once you are discharged, as it is imperative that you return to your primary care physician (or establish a relationship with a primary care physician if you do not have one) for your aftercare needs so that they can reassess your need for medications and monitor your lab values.    Allergies as of 03/13/2021      Reactions   Metoclopramide Other (See Comments)   unknown   Codeine Nausea And Vomiting   Pt reports that all opiates "make me violently ill".   Other Itching, Rash   Food-cranberries   Pain Patch [menthol] Nausea And Vomiting   Statins Other (See Comments)   Muscle wasting, attempted Crestor 10mg  biw, then decreased to qw, but still had muscle problems.  Re-challenged after off x several weeks, problem resumed.  Muscle wasting, attempted Crestor 10mg  biw, then decreased to qw, but still had muscle problems.  Re-challenged after off x several weeks, problem resumed.    Alendronate Other (See Comments)   Stomach pain   Aspirin Tinitus   Duragesic-100 [fentanyl] Tinitus   Duragesic patch   Fenofibrate Other (See Comments)   Arthralgias and myalgias   Fosamax [alendronate Sodium] Other (See Comments)   Stomach pain   Losartan Other (See Comments)   Tremors   Miacalcin [calcitonin (salmon)] Other (See Comments)   unknown   Nitrofurantoin Other (See Comments)   Tremors   Nsaids Other (See Comments)   Avoid with CKD III   Prolia [denosumab] Other (See Comments)   Due to kidney function   Sulfamethoxazole-trimethoprim Nausea And Vomiting   Tolmetin Other (See Comments)   Avoid  with CKD III Avoid with CKD III   Welchol [colesevelam] Other (See Comments)   Myalgias   Zetia [ezetimibe]    Arthralgias and myalgias   Lovaza [omega-3-acid Ethyl Esters] Palpitations      Medication List    STOP taking these medications   diphenhydrAMINE 25 MG tablet Commonly  known as: BENADRYL   triamterene-hydrochlorothiazide 75-50 MG tablet Commonly known as: MAXZIDE     TAKE these medications   (feeding supplement) PROSource Plus liquid Take 30 mLs by mouth 2 (two) times daily between meals.   feeding supplement (GLUCERNA SHAKE) Liqd Take 237 mLs by mouth 3 (three) times daily between meals.   amitriptyline 25 MG tablet Commonly known as: ELAVIL Take 25 mg by mouth at bedtime.   aspirin 81 MG chewable tablet Chew 81 mg by mouth daily.   carvedilol 12.5 MG tablet Commonly known as: COREG Take 1 tablet (12.5 mg total) by mouth 2 (two) times daily with a meal.   cephALEXin 500 MG capsule Commonly known as: KEFLEX Take 1 capsule (500 mg total) by mouth every 8 (eight) hours for 3 days.   dorzolamide 2 % ophthalmic solution Commonly known as: TRUSOPT Place 1 drop into both eyes 2 (two) times daily.   dorzolamide-timolol 22.3-6.8 MG/ML ophthalmic solution Commonly known as: COSOPT Place 1 drop into both eyes 2 (two) times daily.   FISH OIL PO Take 2 capsules by mouth daily.   food thickener Powd Commonly known as: SIMPLYTHICK Take 1 packet by mouth as needed.   gabapentin 100 MG capsule Commonly known as: NEURONTIN Take 1 capsule (100 mg total) by mouth See admin instructions. Take 100mg  in the morning, 100mg  in the afternoon and 300mg  at bedtime. What changed:   when to take this  additional instructions   magic mouthwash w/lidocaine Soln Take 5 mLs by mouth 4 (four) times daily as needed for up to 5 days for mouth pain.   methylPREDNISolone 4 MG tablet Commonly known as: MEDROL Take 4 mg by mouth See admin instructions. Take 4mg  twice daily.  Can take up to 32mg  as needed when ill.   multivitamin with minerals Tabs tablet Take 1 tablet by mouth daily.   ONE TOUCH ULTRA TEST test strip Generic drug: glucose blood Use to test your blood sugar once daily as directed   pantoprazole 40 MG tablet Commonly known as: PROTONIX Take 1 tablet (40 mg total) by mouth daily.   pioglitazone 30 MG tablet Commonly known as: ACTOS Take 30 mg by mouth daily.   potassium chloride 10 MEQ tablet Commonly known as: KLOR-CON Take 1 tablet (10 mEq total) by mouth daily. What changed: how much to take   QUEtiapine 25 MG tablet Commonly known as: SEROQUEL Take 1 tablet (25 mg total) by mouth at bedtime.   rOPINIRole 2 MG tablet Commonly known as: REQUIP Take 2 mg by mouth at bedtime.   rosuvastatin 5 MG tablet Commonly known as: CRESTOR Take 5 mg by mouth daily.   traMADol 50 MG tablet Commonly known as: ULTRAM Take 1 tablet (50 mg total) by mouth every 6 (six) hours as needed for moderate pain.   Vitamin D 50 MCG (2000 UT) Caps Take 2,000 Units by mouth daily.            Durable Medical Equipment  (From admission, onward)         Start     Ordered   03/12/21 1126  For home use only DME 3 n 1  Once        03/12/21 1125           Discharge Care Instructions  (From admission, onward)         Start     Ordered   03/13/21 0000  Discharge wound care:       Comments:  Cleanse legs with soap and water and pat dry.  Apply mupirocin ointment to open wounds.  Cover with foam dressings.  Re-apply daily and change foam every three days.   03/13/21 1027   03/06/21 0000  Discharge wound care:       Comments: Cleanse legs with soap and water and pat dry.  Apply mupirocin ointment to open wounds.  Cover with foam dressings.  Re-apply daily and change foam every three days.   03/06/21 0929          Time coordinating discharge: 35 minutes  The results of significant diagnostics from this hospitalization (including imaging,  microbiology, ancillary and laboratory) are listed below for reference.    Procedures and Diagnostic Studies:   CT Head Wo Contrast  Result Date: 03/05/2021 CLINICAL DATA:  Altered mental status EXAM: CT HEAD WITHOUT CONTRAST TECHNIQUE: Contiguous axial images were obtained from the base of the skull through the vertex without intravenous contrast. COMPARISON:  None. FINDINGS: Brain: No evidence of acute infarction, hemorrhage, hydrocephalus, extra-axial collection or mass lesion/mass effect. Chronic atrophic and ischemic changes are noted. Vascular: No hyperdense vessel or unexpected calcification. Skull: Normal. Negative for fracture or focal lesion. Sinuses/Orbits: No acute finding. Other: None. IMPRESSION: Chronic atrophic and ischemic changes without acute abnormality. Electronically Signed   By: Inez Catalina M.D.   On: 03/05/2021 00:16   MR BRAIN WO CONTRAST  Result Date: 03/05/2021 CLINICAL DATA:  Altered mental status.  Memory loss. EXAM: MRI HEAD WITHOUT CONTRAST TECHNIQUE: Multiplanar, multiecho pulse sequences of the brain and surrounding structures were obtained without intravenous contrast. COMPARISON:  CT head March 04, 2021. FINDINGS: Brain: No acute infarction, hemorrhage, hydrocephalus, extra-axial collection or mass lesion. Moderate to advanced patchy T2/FLAIR hyperintensities within the white matter, most likely related to chronic microvascular ischemic disease. Moderate generalized cerebral volume loss, including mesial temporal volume loss with ex vacuo temporal horn dilation Vascular: Major arterial flow voids are maintained at the skull base. Skull and upper cervical spine: Normal marrow signal. Sinuses/Orbits: Fluid layering within the sphenoid sinuses. Other: Trace right mastoid effusion. IMPRESSION: 1. No evidence of acute intracranial abnormality. 2. Moderate generalized cerebral volume loss. There is mesial temporal volume loss with ex vacuo temporal horn dilation, which is  nonspecific but can be seen with Alzheimer's disease in the correct clinical setting. 3. Moderate to advanced chronic microvascular ischemic disease. 4. Fluid layering in the sphenoid sinuses. Electronically Signed   By: Margaretha Sheffield MD   On: 03/05/2021 15:26   EEG adult  Result Date: 03/05/2021 Lora Havens, MD     03/05/2021  1:14 PM Patient Name: PRISILLA KOCSIS MRN: [id number] Epilepsy Attending: Lora Havens Referring Physician/Provider: Dr. Gean Birchwood Date: 03/05/2021 Duration: 23.59 minutes Patient history: 80 year old female with altered mental status. EEG to evaluate for seizures. Level of alertness: Lethargic AEDs during EEG study: None Technical aspects: This EEG study was done with scalp electrodes positioned according to the 10-20 International system of electrode placement. Electrical activity was acquired at a sampling rate of 500Hz  and reviewed with a high frequency filter of 70Hz  and a low frequency filter of 1Hz . EEG data were recorded continuously and digitally stored. Description: No clear posterior dominant rhythm was seen. EEG showed continuous generalized 5 to 7 Hz theta slowing as well as intermittent generalized 2 to 3 Hz delta slowing. Hyperventilation and photic stimulation were not performed.   ABNORMALITY -Continuous slow, generalized IMPRESSION: This study is suggestive of moderate diffuse encephalopathy, nonspecific etiology. No  seizures or epileptiform discharges were seen throughout the recording. Lora Havens   ECHOCARDIOGRAM COMPLETE  Result Date: 03/05/2021    ECHOCARDIOGRAM REPORT   Patient Name:   ANANYA MCCLEESE Date of Exam: 03/05/2021 Medical Rec #:  [id number]          Height:       66.0 in Accession #:    [id number]         Weight:       126.8 lb Date of Birth:  1941/07/14          BSA:          1.647 m Patient Age:    51 years           BP:           119/56 mmHg Patient Gender: F                  HR:           75 bpm. Exam Location:  Inpatient  Procedure: 2D Echo Indications:    Atrial Fibrillation  History:        Patient has no prior history of Echocardiogram examinations.                 Risk Factors:Hypertension and Diabetes.  Sonographer:    Mikki Santee RDCS (AE) Referring Phys: [place]  1. Difficult study due to poor acoustic windows and limited visualization.  2. Left ventricular ejection fraction, by estimation, is 60 to 65%. The left ventricle has normal function. The left ventricle has no regional wall motion abnormalities. There is mild left ventricular hypertrophy with more notable, discrete thickening in the basal septal segment. Left ventricular diastolic parameters consistent with grade I diastolic function (impaired relaxation).  3. Right ventricular systolic function is normal. The right ventricular size is normal. Mildly increased right ventricular wall thickness.  4. There is moderate mitral valve thickening with moderate-to-severe mitral valve leaflet calcification. There is moderate mitral annular calcification. There is mild mitral stenosis with MVA 1.8cm2 by continuity with mean gradient 61mmHg at HR 95bpm.  5. The aortic valve was not well visualized. Aortic valve regurgitation is not visualized. Comparison(s): No prior Echocardiogram. FINDINGS  Left Ventricle: Difficult study due to poor acoustic windows and limited visualization. Left ventricular ejection fraction, by estimation, is 60 to 65%. The left ventricle has normal function. The left ventricle has no regional wall motion abnormalities. The left ventricular internal cavity size was normal in size. There is mild asymmetric left ventricular hypertrophy of the basal-septal segment. Left ventricular diastolic parameters are consistent with Grade I diastolic dysfunction (impaired relaxation). Right Ventricle: The right ventricular size is normal. Mildly increased right ventricular wall thickness. Right ventricular systolic function is normal.  Left Atrium: Left atrial size was not well visualized. Right Atrium: Right atrial size was normal in size. Pericardium: There is no evidence of pericardial effusion. Presence of pericardial fat pad. Mitral Valve: The mitral valve is abnormal. There is moderate thickening of the mitral valve leaflet(s). There is moderate calcification of the mitral valve leaflet(s). Moderate mitral annular calcification. Trivial mitral valve regurgitation. Mild mitral valve stenosis. MV peak gradient, 12.8 mmHg. The mean mitral valve gradient is 5.0 mmHg. Tricuspid Valve: The tricuspid valve is normal in structure. Tricuspid valve regurgitation is trivial. Aortic Valve: The aortic valve was not well visualized. Aortic valve regurgitation is not visualized. Pulmonic Valve: The pulmonic valve was normal in structure. Pulmonic valve regurgitation is  trivial. Aorta: The aortic root is normal in size and structure. IAS/Shunts: No atrial level shunt detected by color flow Doppler.  LEFT VENTRICLE PLAX 2D LVIDd:         3.10 cm LVIDs:         2.20 cm LV PW:         1.20 cm LV IVS:        1.10 cm LVOT diam:     1.90 cm LV SV:         74 LV SV Index:   45 LVOT Area:     2.84 cm  LEFT ATRIUM             Index       RIGHT ATRIUM          Index LA diam:        2.90 cm 1.76 cm/m  RA Area:     9.53 cm LA Vol (A2C):   37.4 ml 22.70 ml/m RA Volume:   19.10 ml 11.59 ml/m LA Vol (A4C):   44.6 ml 27.07 ml/m LA Biplane Vol: 43.0 ml 26.10 ml/m  AORTIC VALVE LVOT Vmax:   141.67 cm/s LVOT Vmean:  103.300 cm/s LVOT VTI:    0.259 m  AORTA Ao Root diam: 2.90 cm MITRAL VALVE MV Area (PHT): 2.71 cm     SHUNTS MV Area VTI:   1.98 cm     Systemic VTI:  0.26 m MV Peak grad:  12.8 mmHg    Systemic Diam: 1.90 cm MV Mean grad:  5.0 mmHg MV Vmax:       1.79 m/s MV Vmean:      104.0 cm/s MV Decel Time: 280 msec MV E velocity: 87.60 cm/s MV A velocity: 153.00 cm/s MV E/A ratio:  0.57 Gwyndolyn Kaufman MD Electronically signed by Gwyndolyn Kaufman MD Signature  Date/Time: 03/05/2021/3:26:23 PM    Final      Labs:   Basic Metabolic Panel: Recent Labs  Lab 03/07/21 0240 03/08/21 0253 03/09/21 0248 03/10/21 0227 03/11/21 0531  NA 139 141 143 141 140  K 3.3* 3.4* 3.6 3.6 3.3*  CL 103 110 112* 107 103  CO2 23 23 22 25 27   GLUCOSE 113* 137* 136* 128* 138*  BUN 26* 25* 25* 22 23  CREATININE 0.84 0.71 0.62 0.56 0.73  CALCIUM 8.3* 8.2* 8.2* 8.4* 8.6*   GFR Estimated Creatinine Clearance: 50.9 mL/min (by C-G formula based on SCr of 0.73 mg/dL). Liver Function Tests: No results for input(s): AST, ALT, ALKPHOS, BILITOT, PROT, ALBUMIN in the last 168 hours. No results for input(s): LIPASE, AMYLASE in the last 168 hours. No results for input(s): AMMONIA in the last 168 hours. Coagulation profile No results for input(s): INR, PROTIME in the last 168 hours.  CBC: Recent Labs  Lab 03/07/21 0240 03/08/21 0253 03/09/21 0248 03/10/21 0227 03/11/21 0531  WBC 8.7 6.4 6.0 8.6 6.2  NEUTROABS 6.8 5.0 4.2 6.2 4.4  HGB 11.9* 10.8* 11.0* 12.1 12.5  HCT 37.8 35.5* 36.2 39.6 39.3  MCV 98.4 101.1* 102.3* 101.8* 97.8  PLT 172 198 196 226 195   Cardiac Enzymes: No results for input(s): CKTOTAL, CKMB, CKMBINDEX, TROPONINI in the last 168 hours. BNP: Invalid input(s): POCBNP CBG: Recent Labs  Lab 03/12/21 1647 03/12/21 1945 03/12/21 2352 03/13/21 0511 03/13/21 0817  GLUCAP 231* 155* 142* 143* 115*   D-Dimer No results for input(s): DDIMER in the last 72 hours. Hgb A1c No results for input(s): HGBA1C in the last 72  hours. Lipid Profile No results for input(s): CHOL, HDL, LDLCALC, TRIG, CHOLHDL, LDLDIRECT in the last 72 hours. Thyroid function studies No results for input(s): TSH, T4TOTAL, T3FREE, THYROIDAB in the last 72 hours.  Invalid input(s): FREET3 Anemia work up No results for input(s): VITAMINB12, FOLATE, FERRITIN, TIBC, IRON, RETICCTPCT in the last 72 hours. Microbiology Recent Results (from the past 240 hour(s))  Urine Culture      Status: Abnormal   Collection Time: 03/04/21  8:46 PM   Specimen: Urine, Random  Result Value Ref Range Status   Specimen Description   Final    URINE, RANDOM Performed at [facility] [address] [place], [place] [address]    Special Requests   Final    NONE Performed at [facility], [place] [address], [place], [place] [address]    Culture MULTIPLE SPECIES PRESENT, SUGGEST RECOLLECTION (A)  Final   Report Status 03/06/2021 FINAL  Final  Blood culture (routine x 2)     Status: None   Collection Time: 03/04/21  8:46 PM   Specimen: BLOOD LEFT WRIST  Result Value Ref Range Status   Specimen Description   Final    BLOOD LEFT WRIST Performed at [facility] [address], [place], [place] [address]    Special Requests   Final    BOTTLES DRAWN AEROBIC AND ANAEROBIC Blood Culture adequate volume Performed at [facility] [address], [place], [place] [address]    Culture   Final    NO GROWTH 5 DAYS Performed at [facility], [place] [address] [place], [place] [address]    Report Status 03/10/2021 FINAL  Final  Blood culture (routine x 2)     Status: None   Collection Time: 03/04/21  8:51 PM   Specimen: BLOOD  Result Value Ref Range Status   Specimen Description   Final    BLOOD BLOOD RIGHT FOREARM Performed at [facility] [address] [place], [place] [address]    Special Requests   Final    BOTTLES DRAWN AEROBIC AND ANAEROBIC Blood Culture adequate volume Performed at [facility] [address], [place], [place] [address]    Culture   Final    NO GROWTH 5 DAYS Performed at [facility], [place] [address] [place],  [address]    Report Status 03/10/2021 FINAL  Final  SARS CORONAVIRUS 2 (TAT 6-24 HRS) Nasopharyngeal Nasopharyngeal Swab     Status: None   Collection Time: 03/04/21 10:58 PM   Specimen: Nasopharyngeal Swab  Result Value Ref  Range Status   SARS Coronavirus 2 NEGATIVE NEGATIVE Final    Comment: (NOTE) SARS-CoV-2 target nucleic acids are NOT DETECTED.  The SARS-CoV-2 RNA is generally detectable in upper and lower respiratory specimens during the acute phase of infection. Negative results do not preclude SARS-CoV-2 infection, do not rule out co-infections with other pathogens, and should not be used as the sole basis for treatment or other patient management decisions. Negative results must be combined with clinical observations, patient history, and epidemiological information. The expected result is Negative.  Fact Sheet for Patients: SugarRoll.be  Fact Sheet for Healthcare Providers: https://www.woods-mathews.com/  This test is not yet approved or cleared by the [place] FDA and  has been authorized for detection and/or diagnosis of SARS-CoV-2 by FDA under an Emergency Use Authorization (EUA). This EUA will remain  in effect (meaning this test can be used) for the  duration of the COVID-19 declaration under Se ction 564(b)(1) of the Act, 21 U.S.C. section 360bbb-3(b)(1), unless the authorization is terminated or revoked sooner.  Performed at [facility], [place] [address] [place], [place] [address]   MRSA PCR Screening     Status: None   Collection Time: 03/05/21  6:34 AM   Specimen: Nasopharyngeal  Result Value Ref Range Status   MRSA by PCR NEGATIVE NEGATIVE Final    Comment:        The GeneXpert MRSA Assay (FDA approved for NASAL specimens only), is one component of a comprehensive MRSA colonization surveillance program. It is not intended to diagnose MRSA infection nor to guide or monitor treatment for MRSA infections. Performed at [facility], [place] [address], [place], [place] [address]      Signed: Marlowe Aschoff Shenoa Hattabaugh  [facility] 03/13/2021, 10:41 AM

## 2021-03-14 DIAGNOSIS — M1710 Unilateral primary osteoarthritis, unspecified knee: Secondary | ICD-10-CM | POA: Diagnosis not present

## 2021-03-14 DIAGNOSIS — N1831 Chronic kidney disease, stage 3a: Secondary | ICD-10-CM | POA: Diagnosis not present

## 2021-03-14 DIAGNOSIS — D631 Anemia in chronic kidney disease: Secondary | ICD-10-CM | POA: Diagnosis not present

## 2021-03-14 DIAGNOSIS — E1143 Type 2 diabetes mellitus with diabetic autonomic (poly)neuropathy: Secondary | ICD-10-CM | POA: Diagnosis not present

## 2021-03-14 DIAGNOSIS — E1122 Type 2 diabetes mellitus with diabetic chronic kidney disease: Secondary | ICD-10-CM | POA: Diagnosis not present

## 2021-03-14 DIAGNOSIS — I129 Hypertensive chronic kidney disease with stage 1 through stage 4 chronic kidney disease, or unspecified chronic kidney disease: Secondary | ICD-10-CM | POA: Diagnosis not present

## 2021-03-14 DIAGNOSIS — L97221 Non-pressure chronic ulcer of left calf limited to breakdown of skin: Secondary | ICD-10-CM | POA: Diagnosis not present

## 2021-03-14 DIAGNOSIS — E1142 Type 2 diabetes mellitus with diabetic polyneuropathy: Secondary | ICD-10-CM | POA: Diagnosis not present

## 2021-03-14 DIAGNOSIS — L97821 Non-pressure chronic ulcer of other part of left lower leg limited to breakdown of skin: Secondary | ICD-10-CM | POA: Diagnosis not present

## 2021-03-14 DIAGNOSIS — I872 Venous insufficiency (chronic) (peripheral): Secondary | ICD-10-CM | POA: Diagnosis not present

## 2021-03-15 LAB — URINE CULTURE: Culture: >=100000 — AB

## 2021-03-15 NOTE — Progress Notes (Addendum)
Addendum : 03/15/21 at 4 pm   Urine culture result came post discharge.  Urine culture sent on 3/14 grew more than 100,000 CFU per mL of Citrobacter freundii, not sensitive to Keflex that patient was discharged on. Today, I called and discussed with patient's daughter Ms. Carrie.  She states that patient is doing great at home.  Her confusion has cleared up.  She is eating better and physical strength is improving as well.  I discussed with her the urine culture report.  At this time, we both agree that patient would not need another course of antibiotics. Patient should follow up with primary care provider for any change.

## 2021-03-16 DIAGNOSIS — N1831 Chronic kidney disease, stage 3a: Secondary | ICD-10-CM | POA: Diagnosis not present

## 2021-03-16 DIAGNOSIS — E1143 Type 2 diabetes mellitus with diabetic autonomic (poly)neuropathy: Secondary | ICD-10-CM | POA: Diagnosis not present

## 2021-03-16 DIAGNOSIS — I129 Hypertensive chronic kidney disease with stage 1 through stage 4 chronic kidney disease, or unspecified chronic kidney disease: Secondary | ICD-10-CM | POA: Diagnosis not present

## 2021-03-16 DIAGNOSIS — M1710 Unilateral primary osteoarthritis, unspecified knee: Secondary | ICD-10-CM | POA: Diagnosis not present

## 2021-03-16 DIAGNOSIS — L97821 Non-pressure chronic ulcer of other part of left lower leg limited to breakdown of skin: Secondary | ICD-10-CM | POA: Diagnosis not present

## 2021-03-16 DIAGNOSIS — D631 Anemia in chronic kidney disease: Secondary | ICD-10-CM | POA: Diagnosis not present

## 2021-03-16 DIAGNOSIS — E1142 Type 2 diabetes mellitus with diabetic polyneuropathy: Secondary | ICD-10-CM | POA: Diagnosis not present

## 2021-03-16 DIAGNOSIS — I872 Venous insufficiency (chronic) (peripheral): Secondary | ICD-10-CM | POA: Diagnosis not present

## 2021-03-16 DIAGNOSIS — E1122 Type 2 diabetes mellitus with diabetic chronic kidney disease: Secondary | ICD-10-CM | POA: Diagnosis not present

## 2021-03-16 DIAGNOSIS — L97221 Non-pressure chronic ulcer of left calf limited to breakdown of skin: Secondary | ICD-10-CM | POA: Diagnosis not present

## 2021-03-19 DIAGNOSIS — I129 Hypertensive chronic kidney disease with stage 1 through stage 4 chronic kidney disease, or unspecified chronic kidney disease: Secondary | ICD-10-CM | POA: Diagnosis not present

## 2021-03-19 DIAGNOSIS — E1143 Type 2 diabetes mellitus with diabetic autonomic (poly)neuropathy: Secondary | ICD-10-CM | POA: Diagnosis not present

## 2021-03-19 DIAGNOSIS — I872 Venous insufficiency (chronic) (peripheral): Secondary | ICD-10-CM | POA: Diagnosis not present

## 2021-03-19 DIAGNOSIS — L97821 Non-pressure chronic ulcer of other part of left lower leg limited to breakdown of skin: Secondary | ICD-10-CM | POA: Diagnosis not present

## 2021-03-19 DIAGNOSIS — N1831 Chronic kidney disease, stage 3a: Secondary | ICD-10-CM | POA: Diagnosis not present

## 2021-03-19 DIAGNOSIS — D631 Anemia in chronic kidney disease: Secondary | ICD-10-CM | POA: Diagnosis not present

## 2021-03-19 DIAGNOSIS — L97221 Non-pressure chronic ulcer of left calf limited to breakdown of skin: Secondary | ICD-10-CM | POA: Diagnosis not present

## 2021-03-19 DIAGNOSIS — M1710 Unilateral primary osteoarthritis, unspecified knee: Secondary | ICD-10-CM | POA: Diagnosis not present

## 2021-03-19 DIAGNOSIS — E1122 Type 2 diabetes mellitus with diabetic chronic kidney disease: Secondary | ICD-10-CM | POA: Diagnosis not present

## 2021-03-19 DIAGNOSIS — E1142 Type 2 diabetes mellitus with diabetic polyneuropathy: Secondary | ICD-10-CM | POA: Diagnosis not present

## 2021-03-20 DIAGNOSIS — Z978 Presence of other specified devices: Secondary | ICD-10-CM | POA: Diagnosis not present

## 2021-03-21 DIAGNOSIS — E1142 Type 2 diabetes mellitus with diabetic polyneuropathy: Secondary | ICD-10-CM | POA: Diagnosis not present

## 2021-03-21 DIAGNOSIS — L97821 Non-pressure chronic ulcer of other part of left lower leg limited to breakdown of skin: Secondary | ICD-10-CM | POA: Diagnosis not present

## 2021-03-21 DIAGNOSIS — N1831 Chronic kidney disease, stage 3a: Secondary | ICD-10-CM | POA: Diagnosis not present

## 2021-03-21 DIAGNOSIS — L97221 Non-pressure chronic ulcer of left calf limited to breakdown of skin: Secondary | ICD-10-CM | POA: Diagnosis not present

## 2021-03-21 DIAGNOSIS — I129 Hypertensive chronic kidney disease with stage 1 through stage 4 chronic kidney disease, or unspecified chronic kidney disease: Secondary | ICD-10-CM | POA: Diagnosis not present

## 2021-03-21 DIAGNOSIS — E1122 Type 2 diabetes mellitus with diabetic chronic kidney disease: Secondary | ICD-10-CM | POA: Diagnosis not present

## 2021-03-21 DIAGNOSIS — E1143 Type 2 diabetes mellitus with diabetic autonomic (poly)neuropathy: Secondary | ICD-10-CM | POA: Diagnosis not present

## 2021-03-21 DIAGNOSIS — I872 Venous insufficiency (chronic) (peripheral): Secondary | ICD-10-CM | POA: Diagnosis not present

## 2021-03-21 DIAGNOSIS — D631 Anemia in chronic kidney disease: Secondary | ICD-10-CM | POA: Diagnosis not present

## 2021-03-21 DIAGNOSIS — M1710 Unilateral primary osteoarthritis, unspecified knee: Secondary | ICD-10-CM | POA: Diagnosis not present

## 2021-03-22 DIAGNOSIS — N1831 Chronic kidney disease, stage 3a: Secondary | ICD-10-CM | POA: Diagnosis not present

## 2021-03-22 DIAGNOSIS — D631 Anemia in chronic kidney disease: Secondary | ICD-10-CM | POA: Diagnosis not present

## 2021-03-22 DIAGNOSIS — M1710 Unilateral primary osteoarthritis, unspecified knee: Secondary | ICD-10-CM | POA: Diagnosis not present

## 2021-03-22 DIAGNOSIS — I872 Venous insufficiency (chronic) (peripheral): Secondary | ICD-10-CM | POA: Diagnosis not present

## 2021-03-22 DIAGNOSIS — L97821 Non-pressure chronic ulcer of other part of left lower leg limited to breakdown of skin: Secondary | ICD-10-CM | POA: Diagnosis not present

## 2021-03-22 DIAGNOSIS — L97221 Non-pressure chronic ulcer of left calf limited to breakdown of skin: Secondary | ICD-10-CM | POA: Diagnosis not present

## 2021-03-22 DIAGNOSIS — E1143 Type 2 diabetes mellitus with diabetic autonomic (poly)neuropathy: Secondary | ICD-10-CM | POA: Diagnosis not present

## 2021-03-22 DIAGNOSIS — E1122 Type 2 diabetes mellitus with diabetic chronic kidney disease: Secondary | ICD-10-CM | POA: Diagnosis not present

## 2021-03-22 DIAGNOSIS — I129 Hypertensive chronic kidney disease with stage 1 through stage 4 chronic kidney disease, or unspecified chronic kidney disease: Secondary | ICD-10-CM | POA: Diagnosis not present

## 2021-03-22 DIAGNOSIS — E1142 Type 2 diabetes mellitus with diabetic polyneuropathy: Secondary | ICD-10-CM | POA: Diagnosis not present

## 2021-03-23 DIAGNOSIS — M1710 Unilateral primary osteoarthritis, unspecified knee: Secondary | ICD-10-CM | POA: Diagnosis not present

## 2021-03-23 DIAGNOSIS — E1122 Type 2 diabetes mellitus with diabetic chronic kidney disease: Secondary | ICD-10-CM | POA: Diagnosis not present

## 2021-03-23 DIAGNOSIS — L97821 Non-pressure chronic ulcer of other part of left lower leg limited to breakdown of skin: Secondary | ICD-10-CM | POA: Diagnosis not present

## 2021-03-23 DIAGNOSIS — D631 Anemia in chronic kidney disease: Secondary | ICD-10-CM | POA: Diagnosis not present

## 2021-03-23 DIAGNOSIS — I872 Venous insufficiency (chronic) (peripheral): Secondary | ICD-10-CM | POA: Diagnosis not present

## 2021-03-23 DIAGNOSIS — L97221 Non-pressure chronic ulcer of left calf limited to breakdown of skin: Secondary | ICD-10-CM | POA: Diagnosis not present

## 2021-03-23 DIAGNOSIS — E1142 Type 2 diabetes mellitus with diabetic polyneuropathy: Secondary | ICD-10-CM | POA: Diagnosis not present

## 2021-03-23 DIAGNOSIS — N1831 Chronic kidney disease, stage 3a: Secondary | ICD-10-CM | POA: Diagnosis not present

## 2021-03-23 DIAGNOSIS — E1143 Type 2 diabetes mellitus with diabetic autonomic (poly)neuropathy: Secondary | ICD-10-CM | POA: Diagnosis not present

## 2021-03-23 DIAGNOSIS — I129 Hypertensive chronic kidney disease with stage 1 through stage 4 chronic kidney disease, or unspecified chronic kidney disease: Secondary | ICD-10-CM | POA: Diagnosis not present

## 2021-03-26 DIAGNOSIS — I872 Venous insufficiency (chronic) (peripheral): Secondary | ICD-10-CM | POA: Diagnosis not present

## 2021-03-26 DIAGNOSIS — L97221 Non-pressure chronic ulcer of left calf limited to breakdown of skin: Secondary | ICD-10-CM | POA: Diagnosis not present

## 2021-03-26 DIAGNOSIS — E1122 Type 2 diabetes mellitus with diabetic chronic kidney disease: Secondary | ICD-10-CM | POA: Diagnosis not present

## 2021-03-26 DIAGNOSIS — M1710 Unilateral primary osteoarthritis, unspecified knee: Secondary | ICD-10-CM | POA: Diagnosis not present

## 2021-03-26 DIAGNOSIS — D631 Anemia in chronic kidney disease: Secondary | ICD-10-CM | POA: Diagnosis not present

## 2021-03-26 DIAGNOSIS — E1142 Type 2 diabetes mellitus with diabetic polyneuropathy: Secondary | ICD-10-CM | POA: Diagnosis not present

## 2021-03-26 DIAGNOSIS — E1143 Type 2 diabetes mellitus with diabetic autonomic (poly)neuropathy: Secondary | ICD-10-CM | POA: Diagnosis not present

## 2021-03-26 DIAGNOSIS — I129 Hypertensive chronic kidney disease with stage 1 through stage 4 chronic kidney disease, or unspecified chronic kidney disease: Secondary | ICD-10-CM | POA: Diagnosis not present

## 2021-03-26 DIAGNOSIS — N1831 Chronic kidney disease, stage 3a: Secondary | ICD-10-CM | POA: Diagnosis not present

## 2021-03-26 DIAGNOSIS — L97821 Non-pressure chronic ulcer of other part of left lower leg limited to breakdown of skin: Secondary | ICD-10-CM | POA: Diagnosis not present

## 2021-03-27 DIAGNOSIS — S81811A Laceration without foreign body, right lower leg, initial encounter: Secondary | ICD-10-CM | POA: Diagnosis not present

## 2021-03-27 DIAGNOSIS — S81812A Laceration without foreign body, left lower leg, initial encounter: Secondary | ICD-10-CM | POA: Diagnosis not present

## 2021-03-27 DIAGNOSIS — L97221 Non-pressure chronic ulcer of left calf limited to breakdown of skin: Secondary | ICD-10-CM | POA: Diagnosis not present

## 2021-03-27 DIAGNOSIS — I872 Venous insufficiency (chronic) (peripheral): Secondary | ICD-10-CM | POA: Diagnosis not present

## 2021-03-27 DIAGNOSIS — I129 Hypertensive chronic kidney disease with stage 1 through stage 4 chronic kidney disease, or unspecified chronic kidney disease: Secondary | ICD-10-CM | POA: Diagnosis not present

## 2021-03-27 DIAGNOSIS — D631 Anemia in chronic kidney disease: Secondary | ICD-10-CM | POA: Diagnosis not present

## 2021-03-27 DIAGNOSIS — N1831 Chronic kidney disease, stage 3a: Secondary | ICD-10-CM | POA: Diagnosis not present

## 2021-03-27 DIAGNOSIS — M1710 Unilateral primary osteoarthritis, unspecified knee: Secondary | ICD-10-CM | POA: Diagnosis not present

## 2021-03-27 DIAGNOSIS — L97821 Non-pressure chronic ulcer of other part of left lower leg limited to breakdown of skin: Secondary | ICD-10-CM | POA: Diagnosis not present

## 2021-03-27 DIAGNOSIS — E1142 Type 2 diabetes mellitus with diabetic polyneuropathy: Secondary | ICD-10-CM | POA: Diagnosis not present

## 2021-03-27 DIAGNOSIS — E1143 Type 2 diabetes mellitus with diabetic autonomic (poly)neuropathy: Secondary | ICD-10-CM | POA: Diagnosis not present

## 2021-03-27 DIAGNOSIS — E1122 Type 2 diabetes mellitus with diabetic chronic kidney disease: Secondary | ICD-10-CM | POA: Diagnosis not present

## 2021-03-28 DIAGNOSIS — N1831 Chronic kidney disease, stage 3a: Secondary | ICD-10-CM | POA: Diagnosis not present

## 2021-03-28 DIAGNOSIS — E1143 Type 2 diabetes mellitus with diabetic autonomic (poly)neuropathy: Secondary | ICD-10-CM | POA: Diagnosis not present

## 2021-03-28 DIAGNOSIS — E1142 Type 2 diabetes mellitus with diabetic polyneuropathy: Secondary | ICD-10-CM | POA: Diagnosis not present

## 2021-03-28 DIAGNOSIS — L97821 Non-pressure chronic ulcer of other part of left lower leg limited to breakdown of skin: Secondary | ICD-10-CM | POA: Diagnosis not present

## 2021-03-28 DIAGNOSIS — I129 Hypertensive chronic kidney disease with stage 1 through stage 4 chronic kidney disease, or unspecified chronic kidney disease: Secondary | ICD-10-CM | POA: Diagnosis not present

## 2021-03-28 DIAGNOSIS — I872 Venous insufficiency (chronic) (peripheral): Secondary | ICD-10-CM | POA: Diagnosis not present

## 2021-03-28 DIAGNOSIS — M1710 Unilateral primary osteoarthritis, unspecified knee: Secondary | ICD-10-CM | POA: Diagnosis not present

## 2021-03-28 DIAGNOSIS — D631 Anemia in chronic kidney disease: Secondary | ICD-10-CM | POA: Diagnosis not present

## 2021-03-28 DIAGNOSIS — E1122 Type 2 diabetes mellitus with diabetic chronic kidney disease: Secondary | ICD-10-CM | POA: Diagnosis not present

## 2021-03-28 DIAGNOSIS — L97221 Non-pressure chronic ulcer of left calf limited to breakdown of skin: Secondary | ICD-10-CM | POA: Diagnosis not present

## 2021-03-29 DIAGNOSIS — D631 Anemia in chronic kidney disease: Secondary | ICD-10-CM | POA: Diagnosis not present

## 2021-03-29 DIAGNOSIS — L97221 Non-pressure chronic ulcer of left calf limited to breakdown of skin: Secondary | ICD-10-CM | POA: Diagnosis not present

## 2021-03-29 DIAGNOSIS — I129 Hypertensive chronic kidney disease with stage 1 through stage 4 chronic kidney disease, or unspecified chronic kidney disease: Secondary | ICD-10-CM | POA: Diagnosis not present

## 2021-03-29 DIAGNOSIS — E1142 Type 2 diabetes mellitus with diabetic polyneuropathy: Secondary | ICD-10-CM | POA: Diagnosis not present

## 2021-03-29 DIAGNOSIS — I872 Venous insufficiency (chronic) (peripheral): Secondary | ICD-10-CM | POA: Diagnosis not present

## 2021-03-29 DIAGNOSIS — E1143 Type 2 diabetes mellitus with diabetic autonomic (poly)neuropathy: Secondary | ICD-10-CM | POA: Diagnosis not present

## 2021-03-29 DIAGNOSIS — E1122 Type 2 diabetes mellitus with diabetic chronic kidney disease: Secondary | ICD-10-CM | POA: Diagnosis not present

## 2021-03-29 DIAGNOSIS — L97821 Non-pressure chronic ulcer of other part of left lower leg limited to breakdown of skin: Secondary | ICD-10-CM | POA: Diagnosis not present

## 2021-03-29 DIAGNOSIS — M1710 Unilateral primary osteoarthritis, unspecified knee: Secondary | ICD-10-CM | POA: Diagnosis not present

## 2021-03-29 DIAGNOSIS — N1831 Chronic kidney disease, stage 3a: Secondary | ICD-10-CM | POA: Diagnosis not present

## 2021-03-30 DIAGNOSIS — L97221 Non-pressure chronic ulcer of left calf limited to breakdown of skin: Secondary | ICD-10-CM | POA: Diagnosis not present

## 2021-03-30 DIAGNOSIS — E1142 Type 2 diabetes mellitus with diabetic polyneuropathy: Secondary | ICD-10-CM | POA: Diagnosis not present

## 2021-03-30 DIAGNOSIS — E1122 Type 2 diabetes mellitus with diabetic chronic kidney disease: Secondary | ICD-10-CM | POA: Diagnosis not present

## 2021-03-30 DIAGNOSIS — M1710 Unilateral primary osteoarthritis, unspecified knee: Secondary | ICD-10-CM | POA: Diagnosis not present

## 2021-03-30 DIAGNOSIS — L97821 Non-pressure chronic ulcer of other part of left lower leg limited to breakdown of skin: Secondary | ICD-10-CM | POA: Diagnosis not present

## 2021-03-30 DIAGNOSIS — I872 Venous insufficiency (chronic) (peripheral): Secondary | ICD-10-CM | POA: Diagnosis not present

## 2021-03-30 DIAGNOSIS — I129 Hypertensive chronic kidney disease with stage 1 through stage 4 chronic kidney disease, or unspecified chronic kidney disease: Secondary | ICD-10-CM | POA: Diagnosis not present

## 2021-03-30 DIAGNOSIS — N1831 Chronic kidney disease, stage 3a: Secondary | ICD-10-CM | POA: Diagnosis not present

## 2021-03-30 DIAGNOSIS — E1143 Type 2 diabetes mellitus with diabetic autonomic (poly)neuropathy: Secondary | ICD-10-CM | POA: Diagnosis not present

## 2021-03-30 DIAGNOSIS — D631 Anemia in chronic kidney disease: Secondary | ICD-10-CM | POA: Diagnosis not present

## 2021-04-02 DIAGNOSIS — D631 Anemia in chronic kidney disease: Secondary | ICD-10-CM | POA: Diagnosis not present

## 2021-04-02 DIAGNOSIS — L97221 Non-pressure chronic ulcer of left calf limited to breakdown of skin: Secondary | ICD-10-CM | POA: Diagnosis not present

## 2021-04-02 DIAGNOSIS — M1710 Unilateral primary osteoarthritis, unspecified knee: Secondary | ICD-10-CM | POA: Diagnosis not present

## 2021-04-02 DIAGNOSIS — N1831 Chronic kidney disease, stage 3a: Secondary | ICD-10-CM | POA: Diagnosis not present

## 2021-04-02 DIAGNOSIS — L97821 Non-pressure chronic ulcer of other part of left lower leg limited to breakdown of skin: Secondary | ICD-10-CM | POA: Diagnosis not present

## 2021-04-02 DIAGNOSIS — I872 Venous insufficiency (chronic) (peripheral): Secondary | ICD-10-CM | POA: Diagnosis not present

## 2021-04-02 DIAGNOSIS — I129 Hypertensive chronic kidney disease with stage 1 through stage 4 chronic kidney disease, or unspecified chronic kidney disease: Secondary | ICD-10-CM | POA: Diagnosis not present

## 2021-04-02 DIAGNOSIS — E1122 Type 2 diabetes mellitus with diabetic chronic kidney disease: Secondary | ICD-10-CM | POA: Diagnosis not present

## 2021-04-02 DIAGNOSIS — E1143 Type 2 diabetes mellitus with diabetic autonomic (poly)neuropathy: Secondary | ICD-10-CM | POA: Diagnosis not present

## 2021-04-02 DIAGNOSIS — E1142 Type 2 diabetes mellitus with diabetic polyneuropathy: Secondary | ICD-10-CM | POA: Diagnosis not present

## 2021-04-03 DIAGNOSIS — E1122 Type 2 diabetes mellitus with diabetic chronic kidney disease: Secondary | ICD-10-CM | POA: Diagnosis not present

## 2021-04-03 DIAGNOSIS — L97221 Non-pressure chronic ulcer of left calf limited to breakdown of skin: Secondary | ICD-10-CM | POA: Diagnosis not present

## 2021-04-03 DIAGNOSIS — L97821 Non-pressure chronic ulcer of other part of left lower leg limited to breakdown of skin: Secondary | ICD-10-CM | POA: Diagnosis not present

## 2021-04-03 DIAGNOSIS — E1142 Type 2 diabetes mellitus with diabetic polyneuropathy: Secondary | ICD-10-CM | POA: Diagnosis not present

## 2021-04-03 DIAGNOSIS — I872 Venous insufficiency (chronic) (peripheral): Secondary | ICD-10-CM | POA: Diagnosis not present

## 2021-04-03 DIAGNOSIS — E1143 Type 2 diabetes mellitus with diabetic autonomic (poly)neuropathy: Secondary | ICD-10-CM | POA: Diagnosis not present

## 2021-04-03 DIAGNOSIS — N1831 Chronic kidney disease, stage 3a: Secondary | ICD-10-CM | POA: Diagnosis not present

## 2021-04-03 DIAGNOSIS — D631 Anemia in chronic kidney disease: Secondary | ICD-10-CM | POA: Diagnosis not present

## 2021-04-03 DIAGNOSIS — M1710 Unilateral primary osteoarthritis, unspecified knee: Secondary | ICD-10-CM | POA: Diagnosis not present

## 2021-04-03 DIAGNOSIS — I129 Hypertensive chronic kidney disease with stage 1 through stage 4 chronic kidney disease, or unspecified chronic kidney disease: Secondary | ICD-10-CM | POA: Diagnosis not present

## 2021-04-04 DIAGNOSIS — M1710 Unilateral primary osteoarthritis, unspecified knee: Secondary | ICD-10-CM | POA: Diagnosis not present

## 2021-04-04 DIAGNOSIS — N1831 Chronic kidney disease, stage 3a: Secondary | ICD-10-CM | POA: Diagnosis not present

## 2021-04-04 DIAGNOSIS — E1122 Type 2 diabetes mellitus with diabetic chronic kidney disease: Secondary | ICD-10-CM | POA: Diagnosis not present

## 2021-04-04 DIAGNOSIS — L97821 Non-pressure chronic ulcer of other part of left lower leg limited to breakdown of skin: Secondary | ICD-10-CM | POA: Diagnosis not present

## 2021-04-04 DIAGNOSIS — L97221 Non-pressure chronic ulcer of left calf limited to breakdown of skin: Secondary | ICD-10-CM | POA: Diagnosis not present

## 2021-04-04 DIAGNOSIS — D631 Anemia in chronic kidney disease: Secondary | ICD-10-CM | POA: Diagnosis not present

## 2021-04-04 DIAGNOSIS — E1142 Type 2 diabetes mellitus with diabetic polyneuropathy: Secondary | ICD-10-CM | POA: Diagnosis not present

## 2021-04-04 DIAGNOSIS — I129 Hypertensive chronic kidney disease with stage 1 through stage 4 chronic kidney disease, or unspecified chronic kidney disease: Secondary | ICD-10-CM | POA: Diagnosis not present

## 2021-04-04 DIAGNOSIS — I872 Venous insufficiency (chronic) (peripheral): Secondary | ICD-10-CM | POA: Diagnosis not present

## 2021-04-04 DIAGNOSIS — E1143 Type 2 diabetes mellitus with diabetic autonomic (poly)neuropathy: Secondary | ICD-10-CM | POA: Diagnosis not present

## 2021-04-05 DIAGNOSIS — E1143 Type 2 diabetes mellitus with diabetic autonomic (poly)neuropathy: Secondary | ICD-10-CM | POA: Diagnosis not present

## 2021-04-05 DIAGNOSIS — M1710 Unilateral primary osteoarthritis, unspecified knee: Secondary | ICD-10-CM | POA: Diagnosis not present

## 2021-04-05 DIAGNOSIS — N1831 Chronic kidney disease, stage 3a: Secondary | ICD-10-CM | POA: Diagnosis not present

## 2021-04-05 DIAGNOSIS — D631 Anemia in chronic kidney disease: Secondary | ICD-10-CM | POA: Diagnosis not present

## 2021-04-05 DIAGNOSIS — I872 Venous insufficiency (chronic) (peripheral): Secondary | ICD-10-CM | POA: Diagnosis not present

## 2021-04-05 DIAGNOSIS — E1122 Type 2 diabetes mellitus with diabetic chronic kidney disease: Secondary | ICD-10-CM | POA: Diagnosis not present

## 2021-04-05 DIAGNOSIS — E1142 Type 2 diabetes mellitus with diabetic polyneuropathy: Secondary | ICD-10-CM | POA: Diagnosis not present

## 2021-04-05 DIAGNOSIS — L97221 Non-pressure chronic ulcer of left calf limited to breakdown of skin: Secondary | ICD-10-CM | POA: Diagnosis not present

## 2021-04-05 DIAGNOSIS — L97821 Non-pressure chronic ulcer of other part of left lower leg limited to breakdown of skin: Secondary | ICD-10-CM | POA: Diagnosis not present

## 2021-04-05 DIAGNOSIS — I129 Hypertensive chronic kidney disease with stage 1 through stage 4 chronic kidney disease, or unspecified chronic kidney disease: Secondary | ICD-10-CM | POA: Diagnosis not present

## 2021-04-09 DIAGNOSIS — E1142 Type 2 diabetes mellitus with diabetic polyneuropathy: Secondary | ICD-10-CM | POA: Diagnosis not present

## 2021-04-09 DIAGNOSIS — E1143 Type 2 diabetes mellitus with diabetic autonomic (poly)neuropathy: Secondary | ICD-10-CM | POA: Diagnosis not present

## 2021-04-09 DIAGNOSIS — D631 Anemia in chronic kidney disease: Secondary | ICD-10-CM | POA: Diagnosis not present

## 2021-04-09 DIAGNOSIS — I872 Venous insufficiency (chronic) (peripheral): Secondary | ICD-10-CM | POA: Diagnosis not present

## 2021-04-09 DIAGNOSIS — M1710 Unilateral primary osteoarthritis, unspecified knee: Secondary | ICD-10-CM | POA: Diagnosis not present

## 2021-04-09 DIAGNOSIS — L97221 Non-pressure chronic ulcer of left calf limited to breakdown of skin: Secondary | ICD-10-CM | POA: Diagnosis not present

## 2021-04-09 DIAGNOSIS — I129 Hypertensive chronic kidney disease with stage 1 through stage 4 chronic kidney disease, or unspecified chronic kidney disease: Secondary | ICD-10-CM | POA: Diagnosis not present

## 2021-04-09 DIAGNOSIS — L97821 Non-pressure chronic ulcer of other part of left lower leg limited to breakdown of skin: Secondary | ICD-10-CM | POA: Diagnosis not present

## 2021-04-09 DIAGNOSIS — E1122 Type 2 diabetes mellitus with diabetic chronic kidney disease: Secondary | ICD-10-CM | POA: Diagnosis not present

## 2021-04-09 DIAGNOSIS — N1831 Chronic kidney disease, stage 3a: Secondary | ICD-10-CM | POA: Diagnosis not present

## 2021-04-10 DIAGNOSIS — L97821 Non-pressure chronic ulcer of other part of left lower leg limited to breakdown of skin: Secondary | ICD-10-CM | POA: Diagnosis not present

## 2021-04-10 DIAGNOSIS — N1831 Chronic kidney disease, stage 3a: Secondary | ICD-10-CM | POA: Diagnosis not present

## 2021-04-10 DIAGNOSIS — L97221 Non-pressure chronic ulcer of left calf limited to breakdown of skin: Secondary | ICD-10-CM | POA: Diagnosis not present

## 2021-04-10 DIAGNOSIS — I872 Venous insufficiency (chronic) (peripheral): Secondary | ICD-10-CM | POA: Diagnosis not present

## 2021-04-10 DIAGNOSIS — E1143 Type 2 diabetes mellitus with diabetic autonomic (poly)neuropathy: Secondary | ICD-10-CM | POA: Diagnosis not present

## 2021-04-10 DIAGNOSIS — E1142 Type 2 diabetes mellitus with diabetic polyneuropathy: Secondary | ICD-10-CM | POA: Diagnosis not present

## 2021-04-10 DIAGNOSIS — D631 Anemia in chronic kidney disease: Secondary | ICD-10-CM | POA: Diagnosis not present

## 2021-04-10 DIAGNOSIS — M1710 Unilateral primary osteoarthritis, unspecified knee: Secondary | ICD-10-CM | POA: Diagnosis not present

## 2021-04-10 DIAGNOSIS — E1122 Type 2 diabetes mellitus with diabetic chronic kidney disease: Secondary | ICD-10-CM | POA: Diagnosis not present

## 2021-04-10 DIAGNOSIS — I129 Hypertensive chronic kidney disease with stage 1 through stage 4 chronic kidney disease, or unspecified chronic kidney disease: Secondary | ICD-10-CM | POA: Diagnosis not present

## 2021-04-11 DIAGNOSIS — E1142 Type 2 diabetes mellitus with diabetic polyneuropathy: Secondary | ICD-10-CM | POA: Diagnosis not present

## 2021-04-11 DIAGNOSIS — I872 Venous insufficiency (chronic) (peripheral): Secondary | ICD-10-CM | POA: Diagnosis not present

## 2021-04-11 DIAGNOSIS — E1122 Type 2 diabetes mellitus with diabetic chronic kidney disease: Secondary | ICD-10-CM | POA: Diagnosis not present

## 2021-04-11 DIAGNOSIS — L97221 Non-pressure chronic ulcer of left calf limited to breakdown of skin: Secondary | ICD-10-CM | POA: Diagnosis not present

## 2021-04-11 DIAGNOSIS — I129 Hypertensive chronic kidney disease with stage 1 through stage 4 chronic kidney disease, or unspecified chronic kidney disease: Secondary | ICD-10-CM | POA: Diagnosis not present

## 2021-04-11 DIAGNOSIS — L97821 Non-pressure chronic ulcer of other part of left lower leg limited to breakdown of skin: Secondary | ICD-10-CM | POA: Diagnosis not present

## 2021-04-11 DIAGNOSIS — M1710 Unilateral primary osteoarthritis, unspecified knee: Secondary | ICD-10-CM | POA: Diagnosis not present

## 2021-04-11 DIAGNOSIS — E1143 Type 2 diabetes mellitus with diabetic autonomic (poly)neuropathy: Secondary | ICD-10-CM | POA: Diagnosis not present

## 2021-04-11 DIAGNOSIS — D631 Anemia in chronic kidney disease: Secondary | ICD-10-CM | POA: Diagnosis not present

## 2021-04-11 DIAGNOSIS — N1831 Chronic kidney disease, stage 3a: Secondary | ICD-10-CM | POA: Diagnosis not present

## 2021-04-12 DIAGNOSIS — N1831 Chronic kidney disease, stage 3a: Secondary | ICD-10-CM | POA: Diagnosis not present

## 2021-04-12 DIAGNOSIS — I129 Hypertensive chronic kidney disease with stage 1 through stage 4 chronic kidney disease, or unspecified chronic kidney disease: Secondary | ICD-10-CM | POA: Diagnosis not present

## 2021-04-12 DIAGNOSIS — E1143 Type 2 diabetes mellitus with diabetic autonomic (poly)neuropathy: Secondary | ICD-10-CM | POA: Diagnosis not present

## 2021-04-12 DIAGNOSIS — E1142 Type 2 diabetes mellitus with diabetic polyneuropathy: Secondary | ICD-10-CM | POA: Diagnosis not present

## 2021-04-12 DIAGNOSIS — L97821 Non-pressure chronic ulcer of other part of left lower leg limited to breakdown of skin: Secondary | ICD-10-CM | POA: Diagnosis not present

## 2021-04-12 DIAGNOSIS — G4700 Insomnia, unspecified: Secondary | ICD-10-CM | POA: Diagnosis not present

## 2021-04-12 DIAGNOSIS — D631 Anemia in chronic kidney disease: Secondary | ICD-10-CM | POA: Diagnosis not present

## 2021-04-12 DIAGNOSIS — N390 Urinary tract infection, site not specified: Secondary | ICD-10-CM | POA: Diagnosis not present

## 2021-04-12 DIAGNOSIS — E1122 Type 2 diabetes mellitus with diabetic chronic kidney disease: Secondary | ICD-10-CM | POA: Diagnosis not present

## 2021-04-12 DIAGNOSIS — I872 Venous insufficiency (chronic) (peripheral): Secondary | ICD-10-CM | POA: Diagnosis not present

## 2021-04-12 DIAGNOSIS — M1710 Unilateral primary osteoarthritis, unspecified knee: Secondary | ICD-10-CM | POA: Diagnosis not present

## 2021-04-12 DIAGNOSIS — L97221 Non-pressure chronic ulcer of left calf limited to breakdown of skin: Secondary | ICD-10-CM | POA: Diagnosis not present

## 2021-04-13 DIAGNOSIS — N183 Chronic kidney disease, stage 3 unspecified: Secondary | ICD-10-CM | POA: Diagnosis not present

## 2021-04-13 DIAGNOSIS — R4182 Altered mental status, unspecified: Secondary | ICD-10-CM | POA: Diagnosis not present

## 2021-04-13 DIAGNOSIS — R531 Weakness: Secondary | ICD-10-CM | POA: Diagnosis not present

## 2021-04-13 DIAGNOSIS — D860 Sarcoidosis of lung: Secondary | ICD-10-CM | POA: Diagnosis not present

## 2021-04-16 DIAGNOSIS — I872 Venous insufficiency (chronic) (peripheral): Secondary | ICD-10-CM | POA: Diagnosis not present

## 2021-04-16 DIAGNOSIS — L97221 Non-pressure chronic ulcer of left calf limited to breakdown of skin: Secondary | ICD-10-CM | POA: Diagnosis not present

## 2021-04-16 DIAGNOSIS — E1142 Type 2 diabetes mellitus with diabetic polyneuropathy: Secondary | ICD-10-CM | POA: Diagnosis not present

## 2021-04-16 DIAGNOSIS — D631 Anemia in chronic kidney disease: Secondary | ICD-10-CM | POA: Diagnosis not present

## 2021-04-16 DIAGNOSIS — E1122 Type 2 diabetes mellitus with diabetic chronic kidney disease: Secondary | ICD-10-CM | POA: Diagnosis not present

## 2021-04-16 DIAGNOSIS — M1710 Unilateral primary osteoarthritis, unspecified knee: Secondary | ICD-10-CM | POA: Diagnosis not present

## 2021-04-16 DIAGNOSIS — L97821 Non-pressure chronic ulcer of other part of left lower leg limited to breakdown of skin: Secondary | ICD-10-CM | POA: Diagnosis not present

## 2021-04-16 DIAGNOSIS — I129 Hypertensive chronic kidney disease with stage 1 through stage 4 chronic kidney disease, or unspecified chronic kidney disease: Secondary | ICD-10-CM | POA: Diagnosis not present

## 2021-04-16 DIAGNOSIS — E1143 Type 2 diabetes mellitus with diabetic autonomic (poly)neuropathy: Secondary | ICD-10-CM | POA: Diagnosis not present

## 2021-04-16 DIAGNOSIS — N1831 Chronic kidney disease, stage 3a: Secondary | ICD-10-CM | POA: Diagnosis not present

## 2021-04-17 DIAGNOSIS — E1143 Type 2 diabetes mellitus with diabetic autonomic (poly)neuropathy: Secondary | ICD-10-CM | POA: Diagnosis not present

## 2021-04-17 DIAGNOSIS — L97221 Non-pressure chronic ulcer of left calf limited to breakdown of skin: Secondary | ICD-10-CM | POA: Diagnosis not present

## 2021-04-17 DIAGNOSIS — D631 Anemia in chronic kidney disease: Secondary | ICD-10-CM | POA: Diagnosis not present

## 2021-04-17 DIAGNOSIS — E1142 Type 2 diabetes mellitus with diabetic polyneuropathy: Secondary | ICD-10-CM | POA: Diagnosis not present

## 2021-04-17 DIAGNOSIS — I872 Venous insufficiency (chronic) (peripheral): Secondary | ICD-10-CM | POA: Diagnosis not present

## 2021-04-17 DIAGNOSIS — M1710 Unilateral primary osteoarthritis, unspecified knee: Secondary | ICD-10-CM | POA: Diagnosis not present

## 2021-04-17 DIAGNOSIS — L97821 Non-pressure chronic ulcer of other part of left lower leg limited to breakdown of skin: Secondary | ICD-10-CM | POA: Diagnosis not present

## 2021-04-17 DIAGNOSIS — I129 Hypertensive chronic kidney disease with stage 1 through stage 4 chronic kidney disease, or unspecified chronic kidney disease: Secondary | ICD-10-CM | POA: Diagnosis not present

## 2021-04-17 DIAGNOSIS — N1831 Chronic kidney disease, stage 3a: Secondary | ICD-10-CM | POA: Diagnosis not present

## 2021-04-17 DIAGNOSIS — E1122 Type 2 diabetes mellitus with diabetic chronic kidney disease: Secondary | ICD-10-CM | POA: Diagnosis not present

## 2021-04-19 DIAGNOSIS — E1122 Type 2 diabetes mellitus with diabetic chronic kidney disease: Secondary | ICD-10-CM | POA: Diagnosis not present

## 2021-04-19 DIAGNOSIS — E1142 Type 2 diabetes mellitus with diabetic polyneuropathy: Secondary | ICD-10-CM | POA: Diagnosis not present

## 2021-04-19 DIAGNOSIS — Z4800 Encounter for change or removal of nonsurgical wound dressing: Secondary | ICD-10-CM | POA: Diagnosis not present

## 2021-04-19 DIAGNOSIS — L97821 Non-pressure chronic ulcer of other part of left lower leg limited to breakdown of skin: Secondary | ICD-10-CM | POA: Diagnosis not present

## 2021-04-19 DIAGNOSIS — N1831 Chronic kidney disease, stage 3a: Secondary | ICD-10-CM | POA: Diagnosis not present

## 2021-04-19 DIAGNOSIS — D631 Anemia in chronic kidney disease: Secondary | ICD-10-CM | POA: Diagnosis not present

## 2021-04-19 DIAGNOSIS — E1151 Type 2 diabetes mellitus with diabetic peripheral angiopathy without gangrene: Secondary | ICD-10-CM | POA: Diagnosis not present

## 2021-04-19 DIAGNOSIS — I872 Venous insufficiency (chronic) (peripheral): Secondary | ICD-10-CM | POA: Diagnosis not present

## 2021-04-19 DIAGNOSIS — S51812D Laceration without foreign body of left forearm, subsequent encounter: Secondary | ICD-10-CM | POA: Diagnosis not present

## 2021-04-19 DIAGNOSIS — Z111 Encounter for screening for respiratory tuberculosis: Secondary | ICD-10-CM | POA: Diagnosis not present

## 2021-04-19 DIAGNOSIS — I129 Hypertensive chronic kidney disease with stage 1 through stage 4 chronic kidney disease, or unspecified chronic kidney disease: Secondary | ICD-10-CM | POA: Diagnosis not present

## 2021-04-23 DIAGNOSIS — D631 Anemia in chronic kidney disease: Secondary | ICD-10-CM | POA: Diagnosis not present

## 2021-04-23 DIAGNOSIS — Z4800 Encounter for change or removal of nonsurgical wound dressing: Secondary | ICD-10-CM | POA: Diagnosis not present

## 2021-04-23 DIAGNOSIS — S51812D Laceration without foreign body of left forearm, subsequent encounter: Secondary | ICD-10-CM | POA: Diagnosis not present

## 2021-04-23 DIAGNOSIS — E1151 Type 2 diabetes mellitus with diabetic peripheral angiopathy without gangrene: Secondary | ICD-10-CM | POA: Diagnosis not present

## 2021-04-23 DIAGNOSIS — N1831 Chronic kidney disease, stage 3a: Secondary | ICD-10-CM | POA: Diagnosis not present

## 2021-04-23 DIAGNOSIS — I872 Venous insufficiency (chronic) (peripheral): Secondary | ICD-10-CM | POA: Diagnosis not present

## 2021-04-23 DIAGNOSIS — I129 Hypertensive chronic kidney disease with stage 1 through stage 4 chronic kidney disease, or unspecified chronic kidney disease: Secondary | ICD-10-CM | POA: Diagnosis not present

## 2021-04-23 DIAGNOSIS — E1122 Type 2 diabetes mellitus with diabetic chronic kidney disease: Secondary | ICD-10-CM | POA: Diagnosis not present

## 2021-04-23 DIAGNOSIS — E1142 Type 2 diabetes mellitus with diabetic polyneuropathy: Secondary | ICD-10-CM | POA: Diagnosis not present

## 2021-04-23 DIAGNOSIS — L97821 Non-pressure chronic ulcer of other part of left lower leg limited to breakdown of skin: Secondary | ICD-10-CM | POA: Diagnosis not present

## 2021-04-26 DIAGNOSIS — I129 Hypertensive chronic kidney disease with stage 1 through stage 4 chronic kidney disease, or unspecified chronic kidney disease: Secondary | ICD-10-CM | POA: Diagnosis not present

## 2021-04-26 DIAGNOSIS — L97821 Non-pressure chronic ulcer of other part of left lower leg limited to breakdown of skin: Secondary | ICD-10-CM | POA: Diagnosis not present

## 2021-04-26 DIAGNOSIS — E1142 Type 2 diabetes mellitus with diabetic polyneuropathy: Secondary | ICD-10-CM | POA: Diagnosis not present

## 2021-04-26 DIAGNOSIS — D631 Anemia in chronic kidney disease: Secondary | ICD-10-CM | POA: Diagnosis not present

## 2021-04-26 DIAGNOSIS — Z4800 Encounter for change or removal of nonsurgical wound dressing: Secondary | ICD-10-CM | POA: Diagnosis not present

## 2021-04-26 DIAGNOSIS — N1831 Chronic kidney disease, stage 3a: Secondary | ICD-10-CM | POA: Diagnosis not present

## 2021-04-26 DIAGNOSIS — I872 Venous insufficiency (chronic) (peripheral): Secondary | ICD-10-CM | POA: Diagnosis not present

## 2021-04-26 DIAGNOSIS — E1151 Type 2 diabetes mellitus with diabetic peripheral angiopathy without gangrene: Secondary | ICD-10-CM | POA: Diagnosis not present

## 2021-04-26 DIAGNOSIS — S51812D Laceration without foreign body of left forearm, subsequent encounter: Secondary | ICD-10-CM | POA: Diagnosis not present

## 2021-04-26 DIAGNOSIS — E1122 Type 2 diabetes mellitus with diabetic chronic kidney disease: Secondary | ICD-10-CM | POA: Diagnosis not present

## 2021-04-27 DIAGNOSIS — E1122 Type 2 diabetes mellitus with diabetic chronic kidney disease: Secondary | ICD-10-CM | POA: Diagnosis not present

## 2021-04-30 DIAGNOSIS — S51812D Laceration without foreign body of left forearm, subsequent encounter: Secondary | ICD-10-CM | POA: Diagnosis not present

## 2021-04-30 DIAGNOSIS — E1122 Type 2 diabetes mellitus with diabetic chronic kidney disease: Secondary | ICD-10-CM | POA: Diagnosis not present

## 2021-04-30 DIAGNOSIS — N1831 Chronic kidney disease, stage 3a: Secondary | ICD-10-CM | POA: Diagnosis not present

## 2021-04-30 DIAGNOSIS — Z4800 Encounter for change or removal of nonsurgical wound dressing: Secondary | ICD-10-CM | POA: Diagnosis not present

## 2021-04-30 DIAGNOSIS — L97821 Non-pressure chronic ulcer of other part of left lower leg limited to breakdown of skin: Secondary | ICD-10-CM | POA: Diagnosis not present

## 2021-04-30 DIAGNOSIS — E1151 Type 2 diabetes mellitus with diabetic peripheral angiopathy without gangrene: Secondary | ICD-10-CM | POA: Diagnosis not present

## 2021-04-30 DIAGNOSIS — I129 Hypertensive chronic kidney disease with stage 1 through stage 4 chronic kidney disease, or unspecified chronic kidney disease: Secondary | ICD-10-CM | POA: Diagnosis not present

## 2021-04-30 DIAGNOSIS — I872 Venous insufficiency (chronic) (peripheral): Secondary | ICD-10-CM | POA: Diagnosis not present

## 2021-04-30 DIAGNOSIS — D631 Anemia in chronic kidney disease: Secondary | ICD-10-CM | POA: Diagnosis not present

## 2021-04-30 DIAGNOSIS — E1142 Type 2 diabetes mellitus with diabetic polyneuropathy: Secondary | ICD-10-CM | POA: Diagnosis not present

## 2021-05-01 DIAGNOSIS — L97821 Non-pressure chronic ulcer of other part of left lower leg limited to breakdown of skin: Secondary | ICD-10-CM | POA: Diagnosis not present

## 2021-05-01 DIAGNOSIS — S51812D Laceration without foreign body of left forearm, subsequent encounter: Secondary | ICD-10-CM | POA: Diagnosis not present

## 2021-05-01 DIAGNOSIS — E1122 Type 2 diabetes mellitus with diabetic chronic kidney disease: Secondary | ICD-10-CM | POA: Diagnosis not present

## 2021-05-01 DIAGNOSIS — N1831 Chronic kidney disease, stage 3a: Secondary | ICD-10-CM | POA: Diagnosis not present

## 2021-05-01 DIAGNOSIS — I129 Hypertensive chronic kidney disease with stage 1 through stage 4 chronic kidney disease, or unspecified chronic kidney disease: Secondary | ICD-10-CM | POA: Diagnosis not present

## 2021-05-01 DIAGNOSIS — D631 Anemia in chronic kidney disease: Secondary | ICD-10-CM | POA: Diagnosis not present

## 2021-05-01 DIAGNOSIS — Z4800 Encounter for change or removal of nonsurgical wound dressing: Secondary | ICD-10-CM | POA: Diagnosis not present

## 2021-05-01 DIAGNOSIS — E1151 Type 2 diabetes mellitus with diabetic peripheral angiopathy without gangrene: Secondary | ICD-10-CM | POA: Diagnosis not present

## 2021-05-01 DIAGNOSIS — I872 Venous insufficiency (chronic) (peripheral): Secondary | ICD-10-CM | POA: Diagnosis not present

## 2021-05-01 DIAGNOSIS — E1142 Type 2 diabetes mellitus with diabetic polyneuropathy: Secondary | ICD-10-CM | POA: Diagnosis not present

## 2021-05-08 DIAGNOSIS — R296 Repeated falls: Secondary | ICD-10-CM | POA: Diagnosis not present

## 2021-05-08 DIAGNOSIS — S81811A Laceration without foreign body, right lower leg, initial encounter: Secondary | ICD-10-CM | POA: Diagnosis not present

## 2021-05-08 DIAGNOSIS — E119 Type 2 diabetes mellitus without complications: Secondary | ICD-10-CM | POA: Diagnosis not present

## 2021-05-08 DIAGNOSIS — Z7189 Other specified counseling: Secondary | ICD-10-CM | POA: Diagnosis not present

## 2021-05-08 DIAGNOSIS — M1990 Unspecified osteoarthritis, unspecified site: Secondary | ICD-10-CM | POA: Diagnosis not present

## 2021-05-08 DIAGNOSIS — R69 Illness, unspecified: Secondary | ICD-10-CM | POA: Diagnosis not present

## 2021-05-08 DIAGNOSIS — I10 Essential (primary) hypertension: Secondary | ICD-10-CM | POA: Diagnosis not present

## 2021-05-09 DIAGNOSIS — N183 Chronic kidney disease, stage 3 unspecified: Secondary | ICD-10-CM | POA: Diagnosis not present

## 2021-05-09 DIAGNOSIS — E119 Type 2 diabetes mellitus without complications: Secondary | ICD-10-CM | POA: Diagnosis not present

## 2021-05-09 DIAGNOSIS — E041 Nontoxic single thyroid nodule: Secondary | ICD-10-CM | POA: Diagnosis not present

## 2021-05-09 DIAGNOSIS — M1990 Unspecified osteoarthritis, unspecified site: Secondary | ICD-10-CM | POA: Diagnosis not present

## 2021-05-09 DIAGNOSIS — E785 Hyperlipidemia, unspecified: Secondary | ICD-10-CM | POA: Diagnosis not present

## 2021-05-09 DIAGNOSIS — M8580 Other specified disorders of bone density and structure, unspecified site: Secondary | ICD-10-CM | POA: Diagnosis not present

## 2021-05-09 DIAGNOSIS — E559 Vitamin D deficiency, unspecified: Secondary | ICD-10-CM | POA: Diagnosis not present

## 2021-05-09 DIAGNOSIS — G629 Polyneuropathy, unspecified: Secondary | ICD-10-CM | POA: Diagnosis not present

## 2021-05-09 DIAGNOSIS — R296 Repeated falls: Secondary | ICD-10-CM | POA: Diagnosis not present

## 2021-05-09 DIAGNOSIS — I10 Essential (primary) hypertension: Secondary | ICD-10-CM | POA: Diagnosis not present

## 2021-05-11 DIAGNOSIS — I129 Hypertensive chronic kidney disease with stage 1 through stage 4 chronic kidney disease, or unspecified chronic kidney disease: Secondary | ICD-10-CM | POA: Diagnosis not present

## 2021-05-11 DIAGNOSIS — G2581 Restless legs syndrome: Secondary | ICD-10-CM | POA: Diagnosis not present

## 2021-05-11 DIAGNOSIS — E041 Nontoxic single thyroid nodule: Secondary | ICD-10-CM | POA: Diagnosis not present

## 2021-05-11 DIAGNOSIS — K2270 Barrett's esophagus without dysplasia: Secondary | ICD-10-CM | POA: Diagnosis not present

## 2021-05-11 DIAGNOSIS — G629 Polyneuropathy, unspecified: Secondary | ICD-10-CM | POA: Diagnosis not present

## 2021-05-11 DIAGNOSIS — E559 Vitamin D deficiency, unspecified: Secondary | ICD-10-CM | POA: Diagnosis not present

## 2021-05-11 DIAGNOSIS — E1142 Type 2 diabetes mellitus with diabetic polyneuropathy: Secondary | ICD-10-CM | POA: Diagnosis not present

## 2021-05-11 DIAGNOSIS — G4730 Sleep apnea, unspecified: Secondary | ICD-10-CM | POA: Diagnosis not present

## 2021-05-11 DIAGNOSIS — E119 Type 2 diabetes mellitus without complications: Secondary | ICD-10-CM | POA: Diagnosis not present

## 2021-05-11 DIAGNOSIS — N1831 Chronic kidney disease, stage 3a: Secondary | ICD-10-CM | POA: Diagnosis not present

## 2021-05-11 DIAGNOSIS — E1143 Type 2 diabetes mellitus with diabetic autonomic (poly)neuropathy: Secondary | ICD-10-CM | POA: Diagnosis not present

## 2021-05-11 DIAGNOSIS — E1122 Type 2 diabetes mellitus with diabetic chronic kidney disease: Secondary | ICD-10-CM | POA: Diagnosis not present

## 2021-05-11 DIAGNOSIS — D860 Sarcoidosis of lung: Secondary | ICD-10-CM | POA: Diagnosis not present

## 2021-05-11 DIAGNOSIS — K3184 Gastroparesis: Secondary | ICD-10-CM | POA: Diagnosis not present

## 2021-05-13 DIAGNOSIS — G9389 Other specified disorders of brain: Secondary | ICD-10-CM | POA: Diagnosis not present

## 2021-05-13 DIAGNOSIS — E119 Type 2 diabetes mellitus without complications: Secondary | ICD-10-CM | POA: Diagnosis not present

## 2021-05-13 DIAGNOSIS — W1830XA Fall on same level, unspecified, initial encounter: Secondary | ICD-10-CM | POA: Diagnosis not present

## 2021-05-13 DIAGNOSIS — J323 Chronic sphenoidal sinusitis: Secondary | ICD-10-CM | POA: Diagnosis not present

## 2021-05-13 DIAGNOSIS — W19XXXA Unspecified fall, initial encounter: Secondary | ICD-10-CM | POA: Diagnosis not present

## 2021-05-13 DIAGNOSIS — R4182 Altered mental status, unspecified: Secondary | ICD-10-CM | POA: Diagnosis not present

## 2021-05-13 DIAGNOSIS — H7091 Unspecified mastoiditis, right ear: Secondary | ICD-10-CM | POA: Diagnosis not present

## 2021-05-13 DIAGNOSIS — M542 Cervicalgia: Secondary | ICD-10-CM | POA: Diagnosis not present

## 2021-05-13 DIAGNOSIS — R69 Illness, unspecified: Secondary | ICD-10-CM | POA: Diagnosis not present

## 2021-05-13 DIAGNOSIS — D860 Sarcoidosis of lung: Secondary | ICD-10-CM | POA: Diagnosis not present

## 2021-05-13 DIAGNOSIS — R531 Weakness: Secondary | ICD-10-CM | POA: Diagnosis not present

## 2021-05-13 DIAGNOSIS — Z743 Need for continuous supervision: Secondary | ICD-10-CM | POA: Diagnosis not present

## 2021-05-13 DIAGNOSIS — N183 Chronic kidney disease, stage 3 unspecified: Secondary | ICD-10-CM | POA: Diagnosis not present

## 2021-05-14 DIAGNOSIS — T148XXD Other injury of unspecified body region, subsequent encounter: Secondary | ICD-10-CM | POA: Diagnosis not present

## 2021-05-14 DIAGNOSIS — E1122 Type 2 diabetes mellitus with diabetic chronic kidney disease: Secondary | ICD-10-CM | POA: Diagnosis not present

## 2021-05-14 DIAGNOSIS — I10 Essential (primary) hypertension: Secondary | ICD-10-CM | POA: Diagnosis not present

## 2021-05-14 DIAGNOSIS — G2581 Restless legs syndrome: Secondary | ICD-10-CM | POA: Diagnosis not present

## 2021-05-14 DIAGNOSIS — E538 Deficiency of other specified B group vitamins: Secondary | ICD-10-CM | POA: Diagnosis not present

## 2021-05-14 DIAGNOSIS — D860 Sarcoidosis of lung: Secondary | ICD-10-CM | POA: Diagnosis not present

## 2021-05-14 DIAGNOSIS — E785 Hyperlipidemia, unspecified: Secondary | ICD-10-CM | POA: Diagnosis not present

## 2021-05-14 DIAGNOSIS — G4730 Sleep apnea, unspecified: Secondary | ICD-10-CM | POA: Diagnosis not present

## 2021-05-14 DIAGNOSIS — R69 Illness, unspecified: Secondary | ICD-10-CM | POA: Diagnosis not present

## 2021-05-14 DIAGNOSIS — N1831 Chronic kidney disease, stage 3a: Secondary | ICD-10-CM | POA: Diagnosis not present

## 2021-05-14 DIAGNOSIS — E1142 Type 2 diabetes mellitus with diabetic polyneuropathy: Secondary | ICD-10-CM | POA: Diagnosis not present

## 2021-05-14 DIAGNOSIS — K3184 Gastroparesis: Secondary | ICD-10-CM | POA: Diagnosis not present

## 2021-05-14 DIAGNOSIS — L089 Local infection of the skin and subcutaneous tissue, unspecified: Secondary | ICD-10-CM | POA: Diagnosis not present

## 2021-05-14 DIAGNOSIS — K2270 Barrett's esophagus without dysplasia: Secondary | ICD-10-CM | POA: Diagnosis not present

## 2021-05-14 DIAGNOSIS — E1143 Type 2 diabetes mellitus with diabetic autonomic (poly)neuropathy: Secondary | ICD-10-CM | POA: Diagnosis not present

## 2021-05-14 DIAGNOSIS — E559 Vitamin D deficiency, unspecified: Secondary | ICD-10-CM | POA: Diagnosis not present

## 2021-05-14 DIAGNOSIS — I129 Hypertensive chronic kidney disease with stage 1 through stage 4 chronic kidney disease, or unspecified chronic kidney disease: Secondary | ICD-10-CM | POA: Diagnosis not present

## 2021-05-14 DIAGNOSIS — E119 Type 2 diabetes mellitus without complications: Secondary | ICD-10-CM | POA: Diagnosis not present

## 2021-05-14 DIAGNOSIS — R296 Repeated falls: Secondary | ICD-10-CM | POA: Diagnosis not present

## 2021-05-16 DIAGNOSIS — D860 Sarcoidosis of lung: Secondary | ICD-10-CM | POA: Diagnosis not present

## 2021-05-16 DIAGNOSIS — G4730 Sleep apnea, unspecified: Secondary | ICD-10-CM | POA: Diagnosis not present

## 2021-05-16 DIAGNOSIS — S81811A Laceration without foreign body, right lower leg, initial encounter: Secondary | ICD-10-CM | POA: Diagnosis not present

## 2021-05-16 DIAGNOSIS — K2270 Barrett's esophagus without dysplasia: Secondary | ICD-10-CM | POA: Diagnosis not present

## 2021-05-16 DIAGNOSIS — I129 Hypertensive chronic kidney disease with stage 1 through stage 4 chronic kidney disease, or unspecified chronic kidney disease: Secondary | ICD-10-CM | POA: Diagnosis not present

## 2021-05-16 DIAGNOSIS — K3184 Gastroparesis: Secondary | ICD-10-CM | POA: Diagnosis not present

## 2021-05-16 DIAGNOSIS — E1143 Type 2 diabetes mellitus with diabetic autonomic (poly)neuropathy: Secondary | ICD-10-CM | POA: Diagnosis not present

## 2021-05-16 DIAGNOSIS — L97222 Non-pressure chronic ulcer of left calf with fat layer exposed: Secondary | ICD-10-CM | POA: Diagnosis not present

## 2021-05-16 DIAGNOSIS — E1122 Type 2 diabetes mellitus with diabetic chronic kidney disease: Secondary | ICD-10-CM | POA: Diagnosis not present

## 2021-05-16 DIAGNOSIS — E1142 Type 2 diabetes mellitus with diabetic polyneuropathy: Secondary | ICD-10-CM | POA: Diagnosis not present

## 2021-05-16 DIAGNOSIS — G2581 Restless legs syndrome: Secondary | ICD-10-CM | POA: Diagnosis not present

## 2021-05-16 DIAGNOSIS — N1831 Chronic kidney disease, stage 3a: Secondary | ICD-10-CM | POA: Diagnosis not present

## 2021-05-21 DIAGNOSIS — Z7189 Other specified counseling: Secondary | ICD-10-CM | POA: Diagnosis not present

## 2021-05-21 DIAGNOSIS — E1122 Type 2 diabetes mellitus with diabetic chronic kidney disease: Secondary | ICD-10-CM | POA: Diagnosis not present

## 2021-05-21 DIAGNOSIS — K2270 Barrett's esophagus without dysplasia: Secondary | ICD-10-CM | POA: Diagnosis not present

## 2021-05-21 DIAGNOSIS — F0391 Unspecified dementia with behavioral disturbance: Secondary | ICD-10-CM | POA: Diagnosis not present

## 2021-05-21 DIAGNOSIS — G4730 Sleep apnea, unspecified: Secondary | ICD-10-CM | POA: Diagnosis not present

## 2021-05-21 DIAGNOSIS — G2581 Restless legs syndrome: Secondary | ICD-10-CM | POA: Diagnosis not present

## 2021-05-21 DIAGNOSIS — E1143 Type 2 diabetes mellitus with diabetic autonomic (poly)neuropathy: Secondary | ICD-10-CM | POA: Diagnosis not present

## 2021-05-21 DIAGNOSIS — T148XXD Other injury of unspecified body region, subsequent encounter: Secondary | ICD-10-CM | POA: Diagnosis not present

## 2021-05-21 DIAGNOSIS — R296 Repeated falls: Secondary | ICD-10-CM | POA: Diagnosis not present

## 2021-05-21 DIAGNOSIS — I10 Essential (primary) hypertension: Secondary | ICD-10-CM | POA: Diagnosis not present

## 2021-05-21 DIAGNOSIS — R69 Illness, unspecified: Secondary | ICD-10-CM | POA: Diagnosis not present

## 2021-05-21 DIAGNOSIS — I129 Hypertensive chronic kidney disease with stage 1 through stage 4 chronic kidney disease, or unspecified chronic kidney disease: Secondary | ICD-10-CM | POA: Diagnosis not present

## 2021-05-21 DIAGNOSIS — N183 Chronic kidney disease, stage 3 unspecified: Secondary | ICD-10-CM | POA: Diagnosis not present

## 2021-05-21 DIAGNOSIS — K3184 Gastroparesis: Secondary | ICD-10-CM | POA: Diagnosis not present

## 2021-05-21 DIAGNOSIS — F0390 Unspecified dementia without behavioral disturbance: Secondary | ICD-10-CM | POA: Diagnosis not present

## 2021-05-21 DIAGNOSIS — F419 Anxiety disorder, unspecified: Secondary | ICD-10-CM | POA: Diagnosis not present

## 2021-05-21 DIAGNOSIS — R54 Age-related physical debility: Secondary | ICD-10-CM | POA: Diagnosis not present

## 2021-05-21 DIAGNOSIS — E1142 Type 2 diabetes mellitus with diabetic polyneuropathy: Secondary | ICD-10-CM | POA: Diagnosis not present

## 2021-05-21 DIAGNOSIS — E876 Hypokalemia: Secondary | ICD-10-CM | POA: Diagnosis not present

## 2021-05-21 DIAGNOSIS — D860 Sarcoidosis of lung: Secondary | ICD-10-CM | POA: Diagnosis not present

## 2021-05-21 DIAGNOSIS — N1831 Chronic kidney disease, stage 3a: Secondary | ICD-10-CM | POA: Diagnosis not present

## 2021-05-24 DIAGNOSIS — I10 Essential (primary) hypertension: Secondary | ICD-10-CM | POA: Diagnosis not present

## 2021-05-24 DIAGNOSIS — K2270 Barrett's esophagus without dysplasia: Secondary | ICD-10-CM | POA: Diagnosis not present

## 2021-05-24 DIAGNOSIS — E1142 Type 2 diabetes mellitus with diabetic polyneuropathy: Secondary | ICD-10-CM | POA: Diagnosis not present

## 2021-05-24 DIAGNOSIS — G4730 Sleep apnea, unspecified: Secondary | ICD-10-CM | POA: Diagnosis not present

## 2021-05-24 DIAGNOSIS — E1122 Type 2 diabetes mellitus with diabetic chronic kidney disease: Secondary | ICD-10-CM | POA: Diagnosis not present

## 2021-05-24 DIAGNOSIS — N1831 Chronic kidney disease, stage 3a: Secondary | ICD-10-CM | POA: Diagnosis not present

## 2021-05-24 DIAGNOSIS — G2581 Restless legs syndrome: Secondary | ICD-10-CM | POA: Diagnosis not present

## 2021-05-24 DIAGNOSIS — I129 Hypertensive chronic kidney disease with stage 1 through stage 4 chronic kidney disease, or unspecified chronic kidney disease: Secondary | ICD-10-CM | POA: Diagnosis not present

## 2021-05-24 DIAGNOSIS — K3184 Gastroparesis: Secondary | ICD-10-CM | POA: Diagnosis not present

## 2021-05-24 DIAGNOSIS — D860 Sarcoidosis of lung: Secondary | ICD-10-CM | POA: Diagnosis not present

## 2021-05-24 DIAGNOSIS — R69 Illness, unspecified: Secondary | ICD-10-CM | POA: Diagnosis not present

## 2021-05-24 DIAGNOSIS — E1143 Type 2 diabetes mellitus with diabetic autonomic (poly)neuropathy: Secondary | ICD-10-CM | POA: Diagnosis not present

## 2021-05-24 DIAGNOSIS — N183 Chronic kidney disease, stage 3 unspecified: Secondary | ICD-10-CM | POA: Diagnosis not present

## 2021-05-26 DIAGNOSIS — Z7401 Bed confinement status: Secondary | ICD-10-CM | POA: Diagnosis not present

## 2021-05-26 DIAGNOSIS — M25522 Pain in left elbow: Secondary | ICD-10-CM | POA: Diagnosis not present

## 2021-05-26 DIAGNOSIS — E119 Type 2 diabetes mellitus without complications: Secondary | ICD-10-CM | POA: Diagnosis not present

## 2021-05-26 DIAGNOSIS — M1712 Unilateral primary osteoarthritis, left knee: Secondary | ICD-10-CM | POA: Diagnosis not present

## 2021-05-26 DIAGNOSIS — S8002XA Contusion of left knee, initial encounter: Secondary | ICD-10-CM | POA: Diagnosis not present

## 2021-05-26 DIAGNOSIS — Z23 Encounter for immunization: Secondary | ICD-10-CM | POA: Diagnosis not present

## 2021-05-26 DIAGNOSIS — S51812A Laceration without foreign body of left forearm, initial encounter: Secondary | ICD-10-CM | POA: Diagnosis not present

## 2021-05-26 DIAGNOSIS — S51012A Laceration without foreign body of left elbow, initial encounter: Secondary | ICD-10-CM | POA: Diagnosis not present

## 2021-05-26 DIAGNOSIS — R69 Illness, unspecified: Secondary | ICD-10-CM | POA: Diagnosis not present

## 2021-05-26 DIAGNOSIS — S81012A Laceration without foreign body, left knee, initial encounter: Secondary | ICD-10-CM | POA: Diagnosis not present

## 2021-05-26 DIAGNOSIS — R404 Transient alteration of awareness: Secondary | ICD-10-CM | POA: Diagnosis not present

## 2021-05-26 DIAGNOSIS — R4182 Altered mental status, unspecified: Secondary | ICD-10-CM | POA: Diagnosis not present

## 2021-05-26 DIAGNOSIS — S41112A Laceration without foreign body of left upper arm, initial encounter: Secondary | ICD-10-CM | POA: Diagnosis not present

## 2021-05-26 DIAGNOSIS — W010XXA Fall on same level from slipping, tripping and stumbling without subsequent striking against object, initial encounter: Secondary | ICD-10-CM | POA: Diagnosis not present

## 2021-05-28 DIAGNOSIS — E876 Hypokalemia: Secondary | ICD-10-CM | POA: Diagnosis not present

## 2021-05-29 DIAGNOSIS — G4730 Sleep apnea, unspecified: Secondary | ICD-10-CM | POA: Diagnosis not present

## 2021-05-29 DIAGNOSIS — N1831 Chronic kidney disease, stage 3a: Secondary | ICD-10-CM | POA: Diagnosis not present

## 2021-05-29 DIAGNOSIS — G2581 Restless legs syndrome: Secondary | ICD-10-CM | POA: Diagnosis not present

## 2021-05-29 DIAGNOSIS — E1142 Type 2 diabetes mellitus with diabetic polyneuropathy: Secondary | ICD-10-CM | POA: Diagnosis not present

## 2021-05-29 DIAGNOSIS — K2270 Barrett's esophagus without dysplasia: Secondary | ICD-10-CM | POA: Diagnosis not present

## 2021-05-29 DIAGNOSIS — E1122 Type 2 diabetes mellitus with diabetic chronic kidney disease: Secondary | ICD-10-CM | POA: Diagnosis not present

## 2021-05-29 DIAGNOSIS — I129 Hypertensive chronic kidney disease with stage 1 through stage 4 chronic kidney disease, or unspecified chronic kidney disease: Secondary | ICD-10-CM | POA: Diagnosis not present

## 2021-05-29 DIAGNOSIS — E1143 Type 2 diabetes mellitus with diabetic autonomic (poly)neuropathy: Secondary | ICD-10-CM | POA: Diagnosis not present

## 2021-05-29 DIAGNOSIS — D860 Sarcoidosis of lung: Secondary | ICD-10-CM | POA: Diagnosis not present

## 2021-05-29 DIAGNOSIS — K3184 Gastroparesis: Secondary | ICD-10-CM | POA: Diagnosis not present

## 2021-05-30 DIAGNOSIS — M1990 Unspecified osteoarthritis, unspecified site: Secondary | ICD-10-CM | POA: Diagnosis not present

## 2021-05-30 DIAGNOSIS — F0391 Unspecified dementia with behavioral disturbance: Secondary | ICD-10-CM | POA: Diagnosis not present

## 2021-05-30 DIAGNOSIS — R69 Illness, unspecified: Secondary | ICD-10-CM | POA: Diagnosis not present

## 2021-05-30 DIAGNOSIS — R54 Age-related physical debility: Secondary | ICD-10-CM | POA: Diagnosis not present

## 2021-05-30 DIAGNOSIS — S81811A Laceration without foreign body, right lower leg, initial encounter: Secondary | ICD-10-CM | POA: Diagnosis not present

## 2021-05-30 DIAGNOSIS — I10 Essential (primary) hypertension: Secondary | ICD-10-CM | POA: Diagnosis not present

## 2021-05-30 DIAGNOSIS — Z7189 Other specified counseling: Secondary | ICD-10-CM | POA: Diagnosis not present

## 2021-06-13 DIAGNOSIS — D860 Sarcoidosis of lung: Secondary | ICD-10-CM | POA: Diagnosis not present

## 2021-06-13 DIAGNOSIS — N183 Chronic kidney disease, stage 3 unspecified: Secondary | ICD-10-CM | POA: Diagnosis not present

## 2021-06-13 DIAGNOSIS — R531 Weakness: Secondary | ICD-10-CM | POA: Diagnosis not present

## 2021-06-13 DIAGNOSIS — R4182 Altered mental status, unspecified: Secondary | ICD-10-CM | POA: Diagnosis not present

## 2021-06-29 DEATH — deceased

## 2022-06-22 IMAGING — CT CT HEAD W/O CM
4 of 9 series · 14 of 47 positions shown, 16 images · non-contrast
Comparison: None.

CLINICAL DATA: Altered mental status

EXAM:
CT HEAD WITHOUT CONTRAST
TECHNIQUE: Contiguous axial images were obtained from the base of the skull
through the vertex without intravenous contrast.

[Series 5: coronal soft tissue · coronal · 0.30mm/px · 3 of 66 slices shown]
[im 17/66  brain]
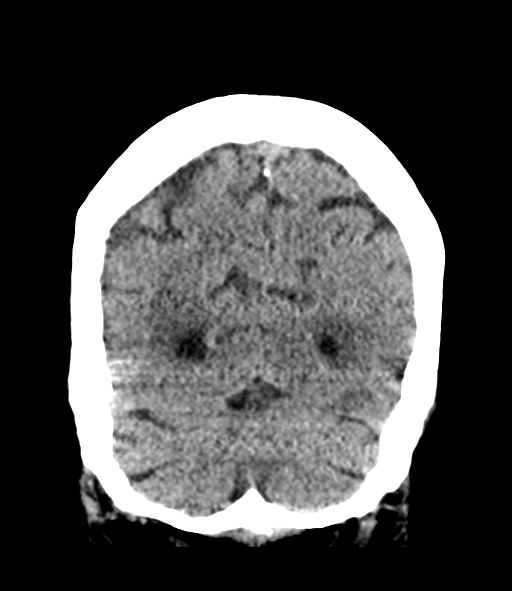
[im 33/66  brain]
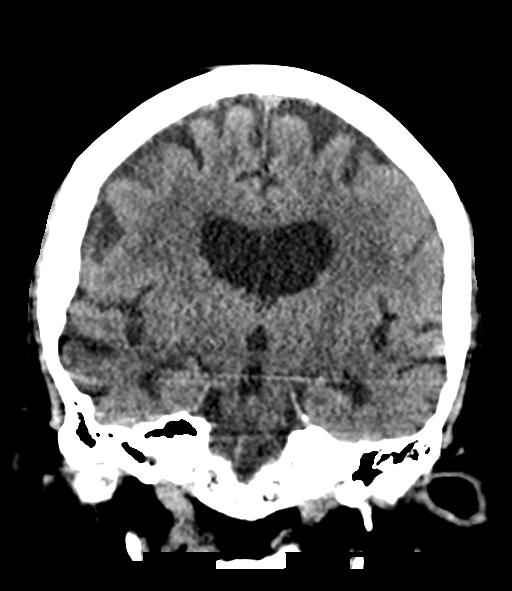
[im 49/66  brain]
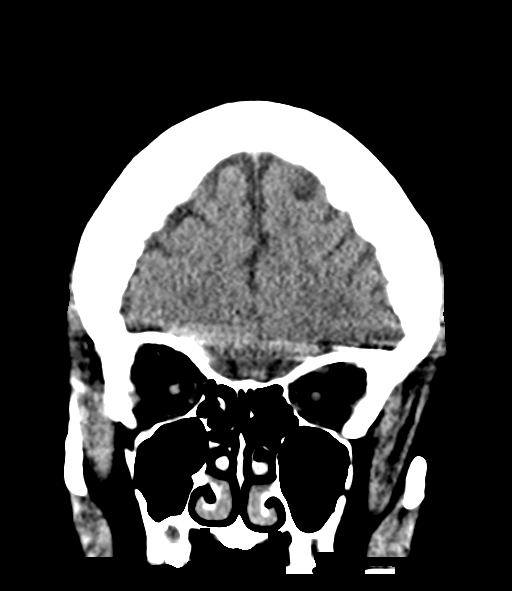

[Series 11: sagittal soft tissue · sagittal · 0.37mm/px · 3 of 53 slices shown]
[im 12/53  brain]
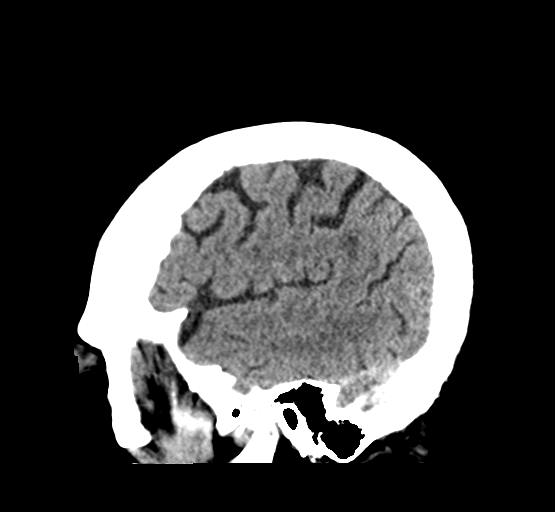
[im 23/53  brain]
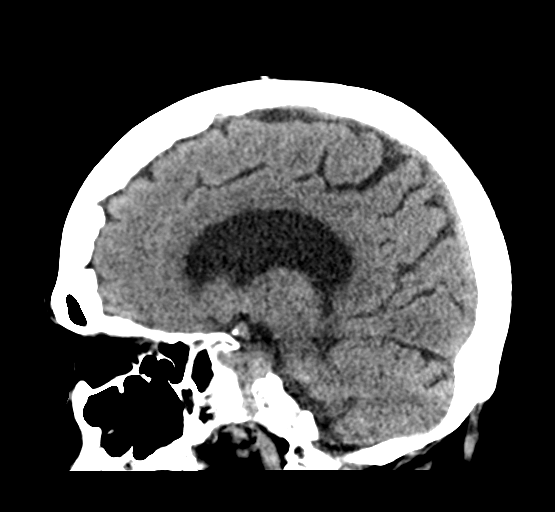
[im 35/53  brain]
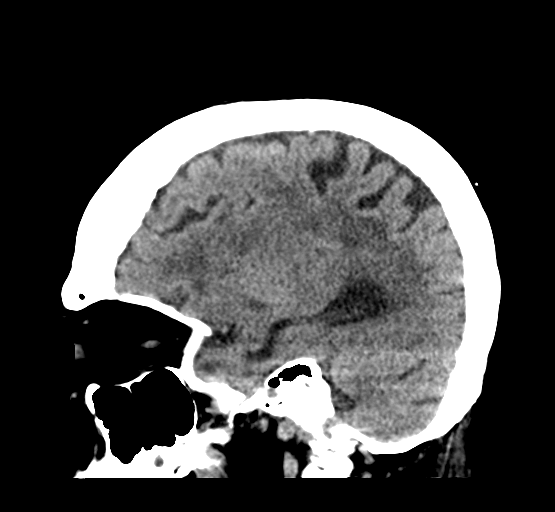

[Series 12: true axial 1 · axial · 0.31mm/px · z∈[-154,-41]mm · 5 of 58 slices shown, 7 images]
[im 10/58  brain]
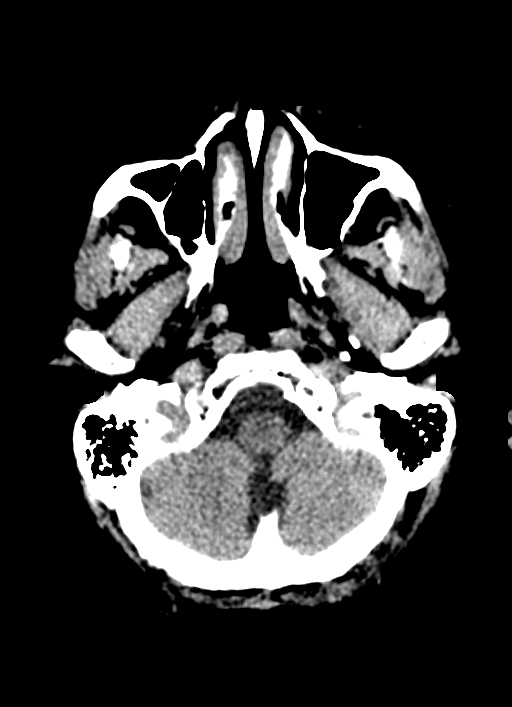
[im 10/58  bone]
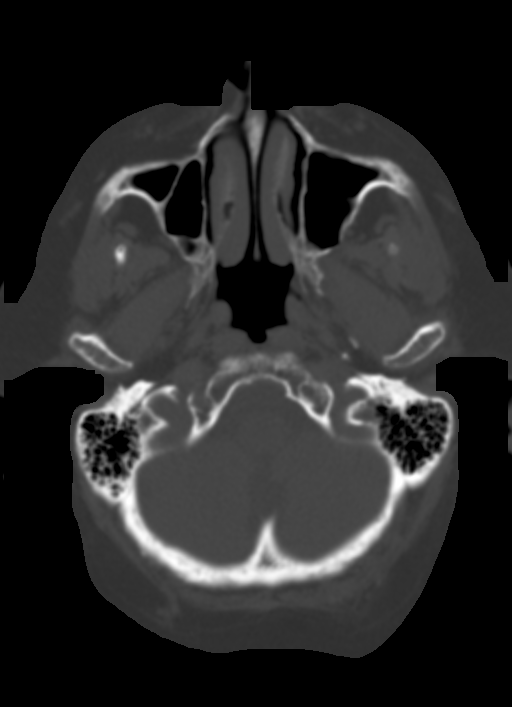
[im 20/58  brain]
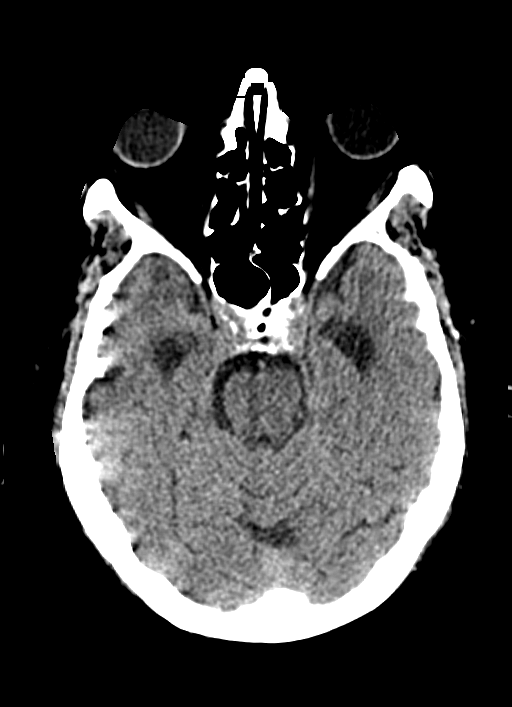
[im 29/58  brain]
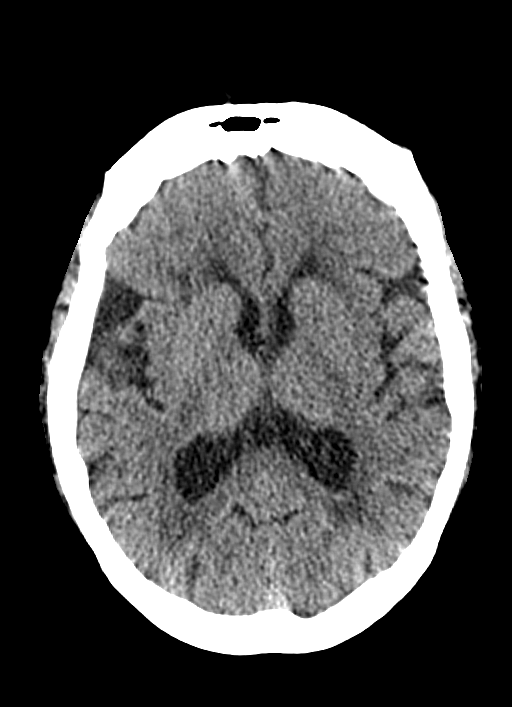
[im 39/58  brain]
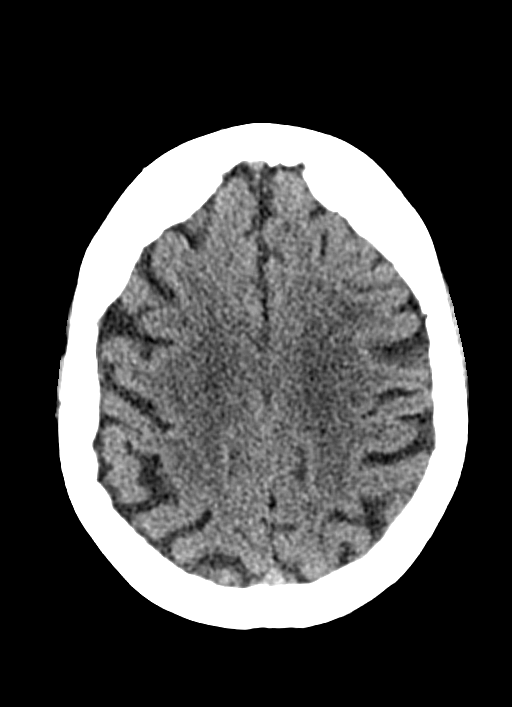
[im 48/58  brain]
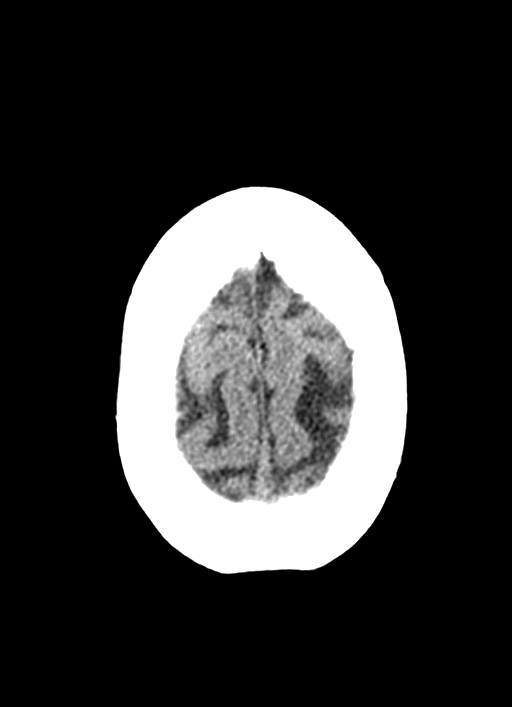
[im 48/58  bone]
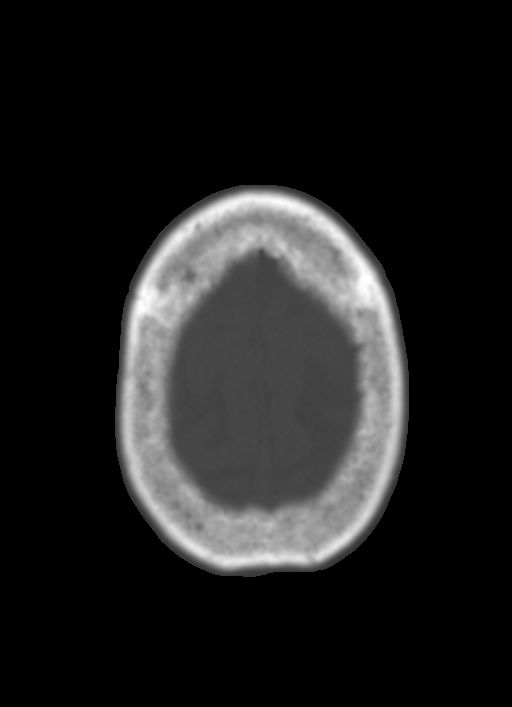

[Series 13: true axial 2 · axial · 0.31mm/px · z∈[-143,-87]mm · 3 of 58 slices shown]
[im 10/58  brain]
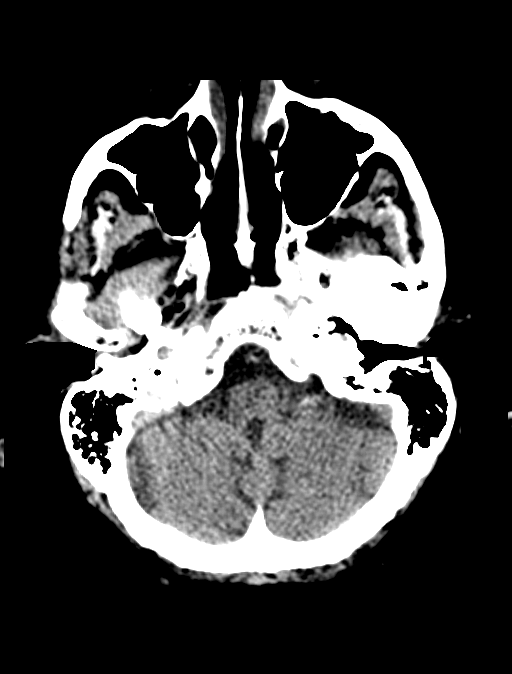
[im 20/58  brain]
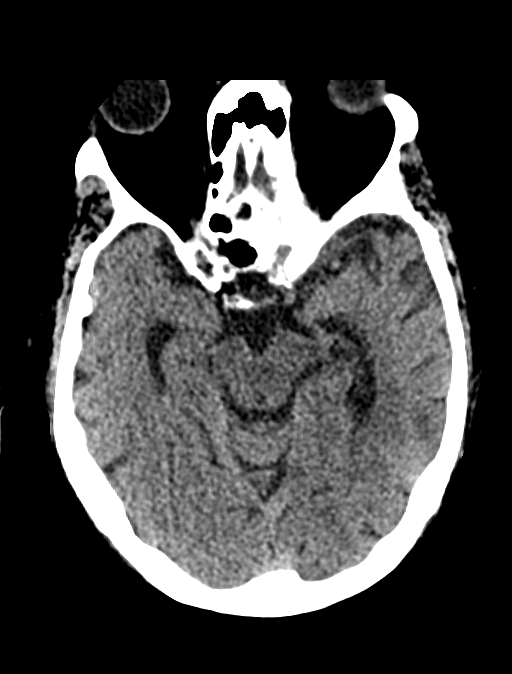
[im 29/58  brain]
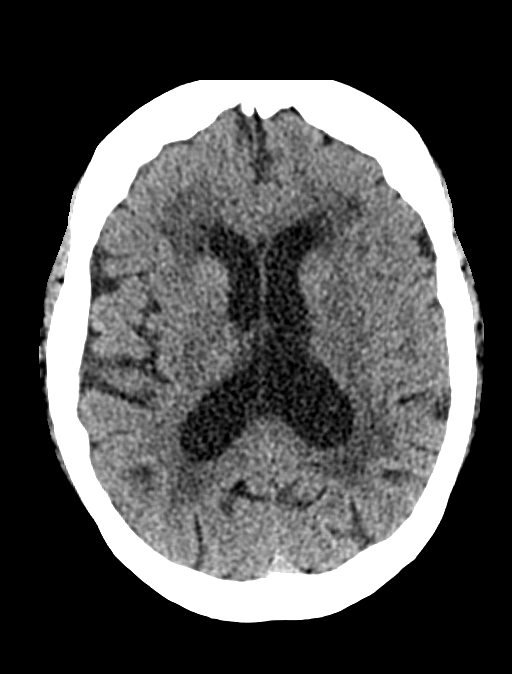

[14 of 47 positions shown; findings below may reference images not displayed]

FINDINGS: Brain: No evidence of acute infarction, hemorrhage, hydrocephalus,
extra-axial collection or mass lesion/mass effect. Chronic atrophic
and ischemic changes are noted.

Vascular: No hyperdense vessel or unexpected calcification.

Skull: Normal. Negative for fracture or focal lesion.

Sinuses/Orbits: No acute finding.

Other: None.
IMPRESSION: Chronic atrophic and ischemic changes without acute abnormality.
